# Patient Record
Sex: Male | Born: 1960 | Race: Black or African American | Hispanic: No | Marital: Married | Smoking: Never smoker
Health system: Southern US, Community
[De-identification: ages and names within clinical notes are randomized; demographics above are authoritative.]

## PROBLEM LIST (undated history)

## (undated) DIAGNOSIS — J45909 Unspecified asthma, uncomplicated: Secondary | ICD-10-CM

## (undated) DIAGNOSIS — C801 Malignant (primary) neoplasm, unspecified: Secondary | ICD-10-CM

## (undated) DIAGNOSIS — G4730 Sleep apnea, unspecified: Secondary | ICD-10-CM

## (undated) DIAGNOSIS — E119 Type 2 diabetes mellitus without complications: Secondary | ICD-10-CM

## (undated) DIAGNOSIS — I10 Essential (primary) hypertension: Secondary | ICD-10-CM

## (undated) DIAGNOSIS — E785 Hyperlipidemia, unspecified: Secondary | ICD-10-CM

## (undated) HISTORY — DX: Essential (primary) hypertension: I10

## (undated) HISTORY — PX: LAPAROSCOPIC RETROPUBIC PROSTATECTOMY: SUR794

## (undated) HISTORY — PX: LUNG SURGERY: SHX703

## (undated) HISTORY — DX: Unspecified asthma, uncomplicated: J45.909

## (undated) HISTORY — PX: CHOLECYSTECTOMY: SHX55

---

## 2003-05-21 ENCOUNTER — Other Ambulatory Visit: Payer: Self-pay

## 2003-05-23 ENCOUNTER — Other Ambulatory Visit: Payer: Self-pay

## 2004-06-18 ENCOUNTER — Emergency Department: Payer: Self-pay | Admitting: Emergency Medicine

## 2004-08-02 ENCOUNTER — Ambulatory Visit: Payer: Self-pay | Admitting: Endocrinology

## 2007-10-15 ENCOUNTER — Ambulatory Visit: Payer: Self-pay | Admitting: Endocrinology

## 2009-06-22 ENCOUNTER — Ambulatory Visit: Payer: Self-pay | Admitting: General Surgery

## 2009-12-16 ENCOUNTER — Emergency Department: Payer: Self-pay | Admitting: Emergency Medicine

## 2013-01-06 ENCOUNTER — Emergency Department: Payer: Self-pay | Admitting: Emergency Medicine

## 2013-07-20 ENCOUNTER — Ambulatory Visit: Payer: Self-pay | Admitting: Internal Medicine

## 2013-07-23 LAB — PULMONARY FUNCTION TEST

## 2013-07-30 ENCOUNTER — Other Ambulatory Visit: Payer: Self-pay

## 2013-08-04 ENCOUNTER — Ambulatory Visit (INDEPENDENT_AMBULATORY_CARE_PROVIDER_SITE_OTHER): Payer: 59 | Admitting: Internal Medicine

## 2013-08-04 ENCOUNTER — Ambulatory Visit (INDEPENDENT_AMBULATORY_CARE_PROVIDER_SITE_OTHER)
Admission: RE | Admit: 2013-08-04 | Discharge: 2013-08-04 | Disposition: A | Discharge status: Home / Self Care | Payer: 59 | Source: Ambulatory Visit | Attending: Internal Medicine | Admitting: Internal Medicine

## 2013-08-04 ENCOUNTER — Encounter (INDEPENDENT_AMBULATORY_CARE_PROVIDER_SITE_OTHER): Payer: Self-pay

## 2013-08-04 ENCOUNTER — Encounter: Payer: Self-pay | Admitting: Internal Medicine

## 2013-08-04 VITALS — BP 160/94 | HR 97 | Temp 98.1°F | Ht 70.0 in | Wt 223.0 lb

## 2013-08-04 DIAGNOSIS — J45909 Unspecified asthma, uncomplicated: Secondary | ICD-10-CM

## 2013-08-04 DIAGNOSIS — I10 Essential (primary) hypertension: Secondary | ICD-10-CM

## 2013-08-04 MED ORDER — MOMETASONE FURO-FORMOTEROL FUM 100-5 MCG/ACT IN AERO: INHALATION_SPRAY | RESPIRATORY_TRACT | Status: DC

## 2013-08-04 NOTE — Patient Instructions (Signed)
dulera 100 Take 2 puffs first thing in am and then another 2 puffs about 12 hours later.     Only use your albuterol as a rescue medication to be used if you can't catch your breath by resting or doing a relaxed purse lip breathing pattern.  - The less you use it, the better it will work when you need it. - Ok to use up to 2 puffs  every 4 hours if you must but call for immediate appointment if use goes up over your usual need - Don't leave home without it !!  (think of it like the spare tire for your car)   Please remember to go to the  x-ray department downstairs for your tests - we will call you with the results when they are available.    Please schedule a follow up office visit in 4 weeks, sooner if needed with pfts on return

## 2013-08-04 NOTE — Progress Notes (Signed)
Subjective:    Patient ID: Tony Benson, male    DOB: 17-Aug-1960  MRN: [id number]  HPI  53 yobm  Never smoker with childhood asthma treated otc primatene never could do distance sports at wt 89 then around age 53 started getting recurrent pna for which eval by [facility]/ [place] pt says  no dx last evaluated around 2000 with residual sob with heavy exertion only referred 08/04/2013 to pulmonary clinic by Dr Tony Benson.   08/04/2013 1st [facility] office visit/ Tony Benson  Chief Complaint  Patient presents with  . Pulmonary Consult    Referred per Dr. Rosario Benson. Pt states referred for "inflammation".  He c/o cough and SOB for the past "50 yrs".  He has seen pulmonary at [facility] and states they ruled out sarcoid and BOOP and advised "touch of Emphysema".  He is SOB with "walking 10 flights of stairs" He has prod cough with "chalky" sputum maybe once per wk.   took himself off advair x one year has started back using saba once or twice daily snce then  With exertion not much change on vs off advair occ am cough occ wakes up with it once or twice weekly  Restarted advair one week prior to OV  > no better  No obvious other patterns in day to day or daytime variabilty or assoc  cp or chest tightness, subjective wheeze overt sinus or hb symptoms. No unusual exp hx or h/o childhood pna/ asthma or knowledge of premature birth.  Sleeping ok without nocturnal  or early am exacerbation  of respiratory  c/o's or need for noct saba. Also denies any obvious fluctuation of symptoms with weather or environmental changes or other aggravating or alleviating factors except as outlined above   Current Medications, Allergies, Complete Past Medical History, Past Surgical History, Family History, and Social History were reviewed in [employer] record.           Review of Systems  Constitutional: Negative for fever, chills, activity change, appetite change and unexpected weight change.  HENT:  Positive for sneezing. Negative for congestion, dental problem, postnasal drip, rhinorrhea, sore throat, trouble swallowing and voice change.   Eyes: Negative for visual disturbance.  Respiratory: Positive for cough and shortness of breath. Negative for choking.   Cardiovascular: Negative for chest pain and leg swelling.  Gastrointestinal: Negative for nausea, vomiting and abdominal pain.  Genitourinary: Negative for difficulty urinating.  Musculoskeletal: Negative for arthralgias.  Skin: Negative for rash.  Psychiatric/Behavioral: Negative for behavioral problems and confusion.       Objective:   Physical Exam  Wt Readings from Last 3 Encounters:  08/04/13 223 lb (101.152 kg)      HEENT: nl dentition, turbinates, and orophanx. Nl external ear canals without cough reflex   NECK :  without JVD/Nodes/TM/ nl carotid upstrokes bilaterally   LUNGS: no acc muscle use, clear to A and P bilaterally without cough on insp or exp maneuvers   CV:  RRR  no s3 or murmur or increase in P2, no edema   ABD:  soft and nontender with nl excursion in the supine position. No bruits or organomegaly, bowel sounds nl  MS:  warm without deformities, calf tenderness, cyanosis or clubbing  SKIN: warm and dry without lesions    NEURO:  alert, approp, no deficits    cxr 08/04/13 Linear opacities in the right mid and lower lung of uncertain  chronicity. Right infrahilar vascular clips. Blunting of the right  lateral costophrenic angle.  Surgical clips right upper abdomen.  No effusion. Heart size normal.  Visualized skeletal structures are unremarkable.       Assessment & Plan:

## 2013-08-05 ENCOUNTER — Other Ambulatory Visit (HOSPITAL_COMMUNITY): Payer: Self-pay | Admitting: *Deleted

## 2013-08-05 DIAGNOSIS — R0602 Shortness of breath: Secondary | ICD-10-CM

## 2013-08-06 ENCOUNTER — Other Ambulatory Visit: Payer: 59

## 2013-08-06 DIAGNOSIS — R0989 Other specified symptoms and signs involving the circulatory and respiratory systems: Secondary | ICD-10-CM

## 2013-08-07 DIAGNOSIS — I10 Essential (primary) hypertension: Secondary | ICD-10-CM | POA: Insufficient documentation

## 2013-08-07 NOTE — Assessment & Plan Note (Signed)
-   Spirometry 07/23/13  FEV1  1.45 (42% ) with ratio 42%  and reported sign  BD response not quantified on report - 08/04/2013 p extensive coaching HFA effectiveness =    90% > dulera 100 2bid  Not Adequate control on present rx, reviewed > far exceeding rule of 2's for use of rescue saba >> rec trial of dulera 100 2bid and step up if not controlled on prev visit  Severity of obst defect suggests sign remodeling in this longterm asthmatic.

## 2013-08-07 NOTE — Assessment & Plan Note (Signed)
ACE inhibitors are problematic in  pts with airway complaints because  even experienced pulmonologists can't always distinguish ace effects from copd/asthma.  By themselves they don't actually cause a problem, much like oxygen can't by itself start a fire, but they certainly serve as a powerful catalyst or enhancer for any "fire"  or inflammatory process in the upper airway, be it caused by an ET  tube or more commonly reflux (especially in the obese or pts with known GERD or who are on biphoshonates).    In the era of ARB near equivalency until we have a better handle on the reversibility of the airway problem,  It would be better to use ARB then add acei back when all goals of asthma met - one problem with rx for lower airways is the effect of ICS on the upper airway in pts on acei.  For now will leave on with low threshold to d/c acei if symptoms continue to be disproportionate to findings.

## 2013-08-10 ENCOUNTER — Encounter (HOSPITAL_COMMUNITY): Payer: Self-pay | Admitting: Internal Medicine

## 2013-08-12 ENCOUNTER — Encounter (HOSPITAL_COMMUNITY): Payer: Self-pay | Admitting: Internal Medicine

## 2013-09-14 ENCOUNTER — Other Ambulatory Visit: Payer: 59

## 2013-10-15 ENCOUNTER — Emergency Department: Payer: Self-pay | Admitting: Emergency Medicine

## 2014-04-21 ENCOUNTER — Telehealth: Payer: Self-pay | Admitting: Internal Medicine

## 2014-04-21 NOTE — Telephone Encounter (Signed)
Received a fax from pharmacy requesting PA for [facility]. Form printed from [employer] site, completed and faxed. Form given to [place] to keep for follow-up.   Fax# [phone] Pt ID# [id number]

## 2014-04-22 NOTE — Telephone Encounter (Signed)
Medication approved from 04/21/14 until 04/21/24  Spoke with the pt and notified that this was donre  He verbalized understanding  Nothing further needed

## 2014-12-01 ENCOUNTER — Other Ambulatory Visit: Payer: Self-pay | Admitting: Anesthesiology

## 2014-12-01 DIAGNOSIS — G8921 Chronic pain due to trauma: Secondary | ICD-10-CM

## 2014-12-01 DIAGNOSIS — M25511 Pain in right shoulder: Secondary | ICD-10-CM

## 2014-12-01 DIAGNOSIS — M542 Cervicalgia: Secondary | ICD-10-CM

## 2014-12-06 ENCOUNTER — Ambulatory Visit: Payer: 59

## 2015-11-13 ENCOUNTER — Other Ambulatory Visit: Payer: Self-pay | Admitting: Anesthesiology

## 2015-11-13 DIAGNOSIS — M25511 Pain in right shoulder: Secondary | ICD-10-CM

## 2015-11-24 ENCOUNTER — Ambulatory Visit
Admission: RE | Admit: 2015-11-24 | Discharge: 2015-11-24 | Disposition: A | Discharge status: Home / Self Care | Payer: 59 | Source: Ambulatory Visit | Attending: Anesthesiology | Admitting: Anesthesiology

## 2015-11-24 ENCOUNTER — Ambulatory Visit: Payer: 59

## 2015-11-24 DIAGNOSIS — M1288 Other specific arthropathies, not elsewhere classified, other specified site: Secondary | ICD-10-CM | POA: Insufficient documentation

## 2015-11-24 DIAGNOSIS — M542 Cervicalgia: Secondary | ICD-10-CM | POA: Diagnosis present

## 2015-11-24 DIAGNOSIS — G8921 Chronic pain due to trauma: Secondary | ICD-10-CM | POA: Diagnosis present

## 2015-11-28 ENCOUNTER — Other Ambulatory Visit: Payer: Self-pay | Admitting: Anesthesiology

## 2015-11-28 DIAGNOSIS — M542 Cervicalgia: Secondary | ICD-10-CM

## 2015-11-28 DIAGNOSIS — G8921 Chronic pain due to trauma: Secondary | ICD-10-CM

## 2015-12-08 ENCOUNTER — Ambulatory Visit: Payer: 59

## 2016-03-22 ENCOUNTER — Other Ambulatory Visit: Payer: Self-pay

## 2016-03-22 ENCOUNTER — Telehealth: Payer: Self-pay | Admitting: Gastroenterology

## 2016-03-22 NOTE — Telephone Encounter (Signed)
colonoscopy

## 2016-03-25 NOTE — Telephone Encounter (Signed)
LVM for pt to return my call on 03/22/16 @ 2:20pm.

## 2016-04-12 NOTE — Telephone Encounter (Signed)
VM was left again on 03/28/16 @ 4:34pm. Mailed letter requesting a return call to schedule.

## 2017-09-24 ENCOUNTER — Other Ambulatory Visit: Payer: Self-pay | Admitting: Surgery

## 2017-09-24 ENCOUNTER — Encounter (HOSPITAL_COMMUNITY): Payer: Self-pay | Admitting: *Deleted

## 2017-09-24 ENCOUNTER — Observation Stay (HOSPITAL_COMMUNITY)
Admission: AD | Admit: 2017-09-24 | Discharge: 2017-09-26 | Disposition: A | Discharge status: Home / Self Care | Payer: BLUE CROSS/BLUE SHIELD | Source: Ambulatory Visit | Attending: Surgery | Admitting: Surgery

## 2017-09-24 DIAGNOSIS — L03115 Cellulitis of right lower limb: Secondary | ICD-10-CM | POA: Diagnosis present

## 2017-09-24 DIAGNOSIS — Z91010 Allergy to peanuts: Secondary | ICD-10-CM | POA: Insufficient documentation

## 2017-09-24 DIAGNOSIS — L02415 Cutaneous abscess of right lower limb: Secondary | ICD-10-CM | POA: Diagnosis not present

## 2017-09-24 DIAGNOSIS — Z885 Allergy status to narcotic agent status: Secondary | ICD-10-CM | POA: Insufficient documentation

## 2017-09-24 DIAGNOSIS — Z7984 Long term (current) use of oral hypoglycemic drugs: Secondary | ICD-10-CM | POA: Insufficient documentation

## 2017-09-24 DIAGNOSIS — J45909 Unspecified asthma, uncomplicated: Secondary | ICD-10-CM | POA: Diagnosis not present

## 2017-09-24 DIAGNOSIS — I10 Essential (primary) hypertension: Secondary | ICD-10-CM | POA: Diagnosis present

## 2017-09-24 DIAGNOSIS — E119 Type 2 diabetes mellitus without complications: Secondary | ICD-10-CM | POA: Insufficient documentation

## 2017-09-24 DIAGNOSIS — Z79899 Other long term (current) drug therapy: Secondary | ICD-10-CM | POA: Insufficient documentation

## 2017-09-24 LAB — CBC WITH DIFFERENTIAL/PLATELET
Basophils Absolute: 0 10*3/uL (ref 0.0–0.1)
Basophils Relative: 1 %
Eosinophils Absolute: 0.3 10*3/uL (ref 0.0–0.7)
Eosinophils Relative: 4 %
HCT: 40.6 % (ref 39.0–52.0)
Hemoglobin: 13.8 g/dL (ref 13.0–17.0)
Lymphocytes Relative: 6 %
Lymphs Abs: 0.3 10*3/uL — ABNORMAL LOW (ref 0.7–4.0)
MCH: 31.7 pg (ref 26.0–34.0)
MCHC: 34 g/dL (ref 30.0–36.0)
MCV: 93.1 fL (ref 78.0–100.0)
Monocytes Absolute: 0.8 10*3/uL (ref 0.1–1.0)
Monocytes Relative: 13 %
Neutro Abs: 4.5 10*3/uL (ref 1.7–7.7)
Neutrophils Relative %: 76 %
Platelets: 310 10*3/uL (ref 150–400)
RBC: 4.36 MIL/uL (ref 4.22–5.81)
RDW: 12.6 % (ref 11.5–15.5)
WBC: 5.9 10*3/uL (ref 4.0–10.5)

## 2017-09-24 LAB — COMPREHENSIVE METABOLIC PANEL
ALT: 24 U/L (ref 17–63)
AST: 18 U/L (ref 15–41)
Albumin: 3.8 g/dL (ref 3.5–5.0)
Alkaline Phosphatase: 62 U/L (ref 38–126)
Anion gap: 7 (ref 5–15)
BUN: 13 mg/dL (ref 6–20)
CO2: 28 mmol/L (ref 22–32)
Calcium: 9.7 mg/dL (ref 8.9–10.3)
Chloride: 106 mmol/L (ref 101–111)
Creatinine, Ser: 1.11 mg/dL (ref 0.61–1.24)
GFR calc Af Amer: >60 mL/min (ref >60)
GFR calc non Af Amer: >60 mL/min (ref >60)
Glucose, Bld: 83 mg/dL (ref 65–99)
Potassium: 4.1 mmol/L (ref 3.5–5.1)
Sodium: 141 mmol/L (ref 135–145)
Total Bilirubin: 0.7 mg/dL (ref 0.3–1.2)
Total Protein: 7.4 g/dL (ref 6.5–8.1)

## 2017-09-24 LAB — GLUCOSE, CAPILLARY: Glucose-Capillary: 87 mg/dL (ref 65–99)

## 2017-09-24 MED ORDER — HYDROCORTISONE 1 % EX CREA: 1.0000 "application " | TOPICAL_CREAM | Freq: Three times a day (TID) | CUTANEOUS | Status: DC | PRN

## 2017-09-24 MED ORDER — LIP MEDEX EX OINT
1.0000 "application " | TOPICAL_OINTMENT | Freq: Two times a day (BID) | CUTANEOUS | Status: DC
  Administered 2017-09-24 – 2017-09-26 (×4): 1 via TOPICAL
  Filled 2017-09-24 (×2): qty 7

## 2017-09-24 MED ORDER — IBUPROFEN 200 MG PO TABS: 400.0000 mg | ORAL_TABLET | Freq: Four times a day (QID) | ORAL | Status: DC | PRN

## 2017-09-24 MED ORDER — POTASSIUM CHLORIDE IN NACL 20-0.45 MEQ/L-% IV SOLN
INTRAVENOUS | Status: DC
  Administered 2017-09-24: 50 mL/h via INTRAVENOUS
  Filled 2017-09-24: qty 1000

## 2017-09-24 MED ORDER — ALBUTEROL SULFATE (2.5 MG/3ML) 0.083% IN NEBU
3.0000 mL | INHALATION_SOLUTION | Freq: Four times a day (QID) | RESPIRATORY_TRACT | Status: DC | PRN
Start: 2017-09-24 — End: 2017-09-26

## 2017-09-24 MED ORDER — LISINOPRIL 20 MG PO TABS: 20.0000 mg | ORAL_TABLET | Freq: Every day | ORAL | Status: DC

## 2017-09-24 MED ORDER — DIPHENHYDRAMINE HCL 50 MG/ML IJ SOLN: 12.5000 mg | Freq: Four times a day (QID) | INTRAMUSCULAR | Status: DC | PRN

## 2017-09-24 MED ORDER — PROCHLORPERAZINE EDISYLATE 10 MG/2ML IJ SOLN: 5.0000 mg | INTRAMUSCULAR | Status: DC | PRN

## 2017-09-24 MED ORDER — PIPERACILLIN-TAZOBACTAM 3.375 G IVPB
3.3750 g | Freq: Once | INTRAVENOUS | Status: AC
  Administered 2017-09-24: 3.375 g via INTRAVENOUS
  Filled 2017-09-24: qty 50

## 2017-09-24 MED ORDER — AMLODIPINE BESYLATE 5 MG PO TABS: 5.0000 mg | ORAL_TABLET | Freq: Every day | ORAL | Status: DC

## 2017-09-24 MED ORDER — HEPARIN SODIUM (PORCINE) 5000 UNIT/ML IJ SOLN
5000.0000 [IU] | Freq: Three times a day (TID) | INTRAMUSCULAR | Status: DC
  Administered 2017-09-24 – 2017-09-26 (×5): 5000 [IU] via SUBCUTANEOUS
  Filled 2017-09-24 (×5): qty 1

## 2017-09-24 MED ORDER — BISACODYL 10 MG RE SUPP: 10.0000 mg | Freq: Two times a day (BID) | RECTAL | Status: DC | PRN

## 2017-09-24 MED ORDER — HYDROCORTISONE 2.5 % RE CREA: 1.0000 "application " | TOPICAL_CREAM | Freq: Four times a day (QID) | RECTAL | Status: DC | PRN

## 2017-09-24 MED ORDER — HYDRALAZINE HCL 20 MG/ML IJ SOLN: 10.0000 mg | INTRAMUSCULAR | Status: DC | PRN

## 2017-09-24 MED ORDER — PHENOL 1.4 % MT LIQD: 1.0000 | OROMUCOSAL | Status: DC | PRN

## 2017-09-24 MED ORDER — PIPERACILLIN-TAZOBACTAM 3.375 G IVPB
3.3750 g | Freq: Three times a day (TID) | INTRAVENOUS | Status: DC
  Administered 2017-09-25 – 2017-09-26 (×4): 3.375 g via INTRAVENOUS
  Filled 2017-09-24 (×4): qty 50

## 2017-09-24 MED ORDER — CHLORTHALIDONE 25 MG PO TABS
25.0000 mg | ORAL_TABLET | Freq: Every day | ORAL | Status: DC
  Administered 2017-09-25: 25 mg via ORAL
  Filled 2017-09-24 (×2): qty 1

## 2017-09-24 MED ORDER — VANCOMYCIN HCL 10 G IV SOLR
2000.0000 mg | Freq: Once | INTRAVENOUS | Status: AC
  Administered 2017-09-24: 2000 mg via INTRAVENOUS
  Filled 2017-09-24: qty 2000

## 2017-09-24 MED ORDER — ALUM & MAG HYDROXIDE-SIMETH 200-200-20 MG/5ML PO SUSP: 30.0000 mL | Freq: Four times a day (QID) | ORAL | Status: DC | PRN

## 2017-09-24 MED ORDER — HYDROCODONE-ACETAMINOPHEN 5-325 MG PO TABS
1.0000 | ORAL_TABLET | Freq: Four times a day (QID) | ORAL | Status: DC | PRN
  Administered 2017-09-24: 1 via ORAL
  Administered 2017-09-25 (×2): 2 via ORAL
  Administered 2017-09-25 – 2017-09-26 (×4): 1 via ORAL
  Filled 2017-09-24: qty 2
  Filled 2017-09-24 (×2): qty 1
  Filled 2017-09-24 (×2): qty 2
  Filled 2017-09-24: qty 1

## 2017-09-24 MED ORDER — ACETAMINOPHEN 325 MG PO TABS: 325.0000 mg | ORAL_TABLET | Freq: Four times a day (QID) | ORAL | Status: DC | PRN

## 2017-09-24 MED ORDER — ONDANSETRON HCL 4 MG/2ML IJ SOLN: 4.0000 mg | Freq: Four times a day (QID) | INTRAMUSCULAR | Status: DC | PRN

## 2017-09-24 MED ORDER — ACETAMINOPHEN 650 MG RE SUPP: 650.0000 mg | Freq: Four times a day (QID) | RECTAL | Status: DC | PRN

## 2017-09-24 MED ORDER — HYDROCODONE-ACETAMINOPHEN 5-325 MG PO TABS
1.0000 | ORAL_TABLET | Freq: Four times a day (QID) | ORAL | Status: DC | PRN
  Filled 2017-09-24: qty 2

## 2017-09-24 MED ORDER — MOMETASONE FURO-FORMOTEROL FUM 200-5 MCG/ACT IN AERO
2.0000 | INHALATION_SPRAY | Freq: Two times a day (BID) | RESPIRATORY_TRACT | Status: DC
  Administered 2017-09-25 – 2017-09-26 (×3): 2 via RESPIRATORY_TRACT
  Filled 2017-09-24: qty 8.8

## 2017-09-24 MED ORDER — MAGIC MOUTHWASH
15.0000 mL | Freq: Four times a day (QID) | ORAL | Status: DC | PRN
  Filled 2017-09-24: qty 15

## 2017-09-24 MED ORDER — FAMOTIDINE IN NACL 20-0.9 MG/50ML-% IV SOLN
20.0000 mg | Freq: Two times a day (BID) | INTRAVENOUS | Status: DC
  Administered 2017-09-24: 20 mg via INTRAVENOUS
  Filled 2017-09-24: qty 50

## 2017-09-24 MED ORDER — DEXTROSE 5 % IV SOLN
1000.0000 mg | Freq: Four times a day (QID) | INTRAVENOUS | Status: DC | PRN
  Filled 2017-09-24: qty 10

## 2017-09-24 MED ORDER — METFORMIN HCL 500 MG PO TABS: 500.0000 mg | ORAL_TABLET | Freq: Two times a day (BID) | ORAL | Status: DC

## 2017-09-24 MED ORDER — GUAIFENESIN-DM 100-10 MG/5ML PO SYRP: 10.0000 mL | ORAL_SOLUTION | ORAL | Status: DC | PRN

## 2017-09-24 MED ORDER — DIAZEPAM 5 MG PO TABS: 5.0000 mg | ORAL_TABLET | Freq: Two times a day (BID) | ORAL | Status: DC | PRN

## 2017-09-24 MED ORDER — SODIUM CHLORIDE 0.9 % IV SOLN
8.0000 mg | Freq: Four times a day (QID) | INTRAVENOUS | Status: DC | PRN
  Filled 2017-09-24: qty 4

## 2017-09-24 MED ORDER — PSYLLIUM 95 % PO PACK
1.0000 | PACK | Freq: Every day | ORAL | Status: DC
  Administered 2017-09-25 – 2017-09-26 (×2): 1 via ORAL
  Filled 2017-09-24 (×2): qty 1

## 2017-09-24 MED ORDER — GABAPENTIN 300 MG PO CAPS
300.0000 mg | ORAL_CAPSULE | Freq: Two times a day (BID) | ORAL | Status: DC
  Administered 2017-09-24 – 2017-09-26 (×4): 300 mg via ORAL
  Filled 2017-09-24 (×4): qty 1

## 2017-09-24 MED ORDER — MENTHOL 3 MG MT LOZG
1.0000 | LOZENGE | OROMUCOSAL | Status: DC | PRN
Start: 2017-09-24 — End: 2017-09-26

## 2017-09-24 MED ORDER — VANCOMYCIN HCL 10 G IV SOLR
1750.0000 mg | INTRAVENOUS | Status: DC
  Administered 2017-09-25: 1750 mg via INTRAVENOUS
  Filled 2017-09-24: qty 1750

## 2017-09-24 MED ORDER — MOMETASONE FURO-FORMOTEROL FUM 100-5 MCG/ACT IN AERO: 2.0000 | INHALATION_SPRAY | Freq: Two times a day (BID) | RESPIRATORY_TRACT | Status: DC

## 2017-09-24 MED ORDER — LACTATED RINGERS IV BOLUS: 1000.0000 mL | Freq: Three times a day (TID) | INTRAVENOUS | Status: DC | PRN

## 2017-09-24 NOTE — Progress Notes (Signed)
Pharmacy Antibiotic Note  Tony Benson is a 57 y.o. male admitted on 09/24/2017 with cellulitis.  Pharmacy has been consulted for Vancomycin & Zosyn dosing.  Plan: Vancomycin 2gm x1, then 1750mg  q24 Zosyn 3.375gm q8 - 4 hr infusion    Temp (24hrs), Avg:98.4 F (36.9 C), Min:98.4 F (36.9 C), Max:98.4 F (36.9 C)  No results for input(s): WBC, CREATININE, LATICACIDVEN, VANCOTROUGH, VANCOPEAK, VANCORANDOM, GENTTROUGH, GENTPEAK, GENTRANDOM, TOBRATROUGH, TOBRAPEAK, TOBRARND, AMIKACINPEAK, AMIKACINTROU, AMIKACIN in the last 168 hours.  CrCl cannot be calculated (No order found.).    Allergies  Allergen Reactions  . Morphine And Related Itching   Antimicrobials this admission: 6/12 Zosyn >>  6/12 Vancomycin  >>   Dose adjustments this admission:  Microbiology results: None ordered on admission  Thank you for allowing pharmacy to be a part of this patient's care.  [name] PharmD Pager [phone] 09/24/2017, 8:43 PM

## 2017-09-24 NOTE — Progress Notes (Signed)
Chief Complaint:  Smoldering abscess of right inner thigh  History of Present Illness:  Tony Benson is an 57 y.o. male who was referred by [name] this afternoon from his office with an abscess of his right inner thigh.  He was seen in the [place] office and is admitted for treatment of an advanced probable staph abscess of his right thigh.  This is been smoldering for about a week and getting worse.  He was seen at next surgery which is a walk in place and they got cultures which are not available and then he saw his primary care doctor, [name] this afternoon who called and I was able to evaluate him and incise and drain this abscess.  He has an area of cellulitis extending down to almost his knee.  He is able to drive and in fact we will make it ranges for admission to [facility] long and he can be admitted for IV vancomycin and evaluation in the morning as to whether he may need further surgical debridement.  We'll consider CT of his thigh.  Past Medical History:  Diagnosis Date  . Asthma   . Hypertension       Current Outpatient Medications  Medication Sig Dispense Refill  . amLODipine (NORVASC) 5 MG tablet TAKE 1 TABLET ONCE A DAY ORALLY  3  . chlorthalidone (HYGROTON) 25 MG tablet TAKE 1 TABLET ONCE A DAY ORALLY  3  . diazepam (VALIUM) 5 MG tablet TAKE 1 TABLET BY MOUTH AT BEDTIME AS NEEDED FOR MUSCLE SPASMS  2  . Fluticasone-Salmeterol (ADVAIR DISKUS) 500-50 MCG/DOSE AEPB Inhale into the lungs.    . gabapentin (NEURONTIN) 300 MG capsule TAKE 1 CAPSULE BY MOUTH EVERY NIGHT FOR 2 WEEKS, THEN 1 CAPSULE EVERY 12 HOURS AFTER THAT  1  . HYDROcodone-acetaminophen (NORCO/VICODIN) 5-325 MG per tablet Take 1 tablet by mouth every 6 (six) hours as needed.    Marland Kitchen lisinopril (PRINIVIL,ZESTRIL) 20 MG tablet Take 1 tablet by mouth daily.    . metFORMIN (GLUCOPHAGE) 500 MG tablet Take 500 mg by mouth 2 (two) times daily with a meal.    . mometasone-formoterol (DULERA) 100-5 MCG/ACT AERO  Take 2 puffs first thing in am and then another 2 puffs about 12 hours later. 1 Inhaler 11  . VENTOLIN HFA 108 (90 BASE) MCG/ACT inhaler Inhale 2 puffs into the lungs every 6 (six) hours as needed.    . Vitamin D, Ergocalciferol, (DRISDOL) 50000 units CAPS capsule TAKE 1 CAPSULE ONCE A WEEK ORALLY  0   No current facility-administered medications for this visit.    Morphine and related Family History  Problem Relation Age of Onset  . Allergies Sister    Social History:   reports that he has never smoked. He has never used smokeless tobacco. He reports that he does not drink alcohol or use drugs.   REVIEW OF SYSTEMS : Negative except for DM and stress as he is working 2 jobs and not getting much sleep  Physical Exam:    Gen:  WDWN AAM NAD  Neurological: Alert and oriented to person, place, and time. Motor and sensory function is grossly intact  Head: Normocephalic and atraumatic.  Eyes: Conjunctivae are normal. Pupils are equal, round, and reactive to light. No scleral icterus.  Neck: Normal range of motion. Neck supple. No tracheal deviation or thyromegaly present.  Cardiovascular:  SR without murmurs or gallops.  No carotid bruits Breast:  Not examined Respiratory: Effort normal.  No  respiratory distress. No chest wall tenderness. Breath sounds normal.  No wheezes, rales or rhonchi.  Abdomen:  Mildly obese GU:  Not examined Musculoskeletal: Normal range of motion. Extremities The right inner thigh has an open area that was draining like a chronic carbuncle;   This was opened in the office and more drainage and was redressed.  Lymphadenopathy: No cervical, preauricular, postauricular or axillary adenopathy is present Skin: Skin is warm and dry. No rash noted. No diaphoresis. No erythema. No pallor. Pscyh: Normal mood and affect. Behavior is normal. Judgment and thought content normal.   LABORATORY RESULTS: No results found for this or any previous visit (from the past 48  hour(s)).   RADIOLOGY RESULTS: No results found.  Problem List: Patient Active Problem List   Diagnosis Date Noted  . HBP (high blood pressure) 08/07/2013  . Asthma, chronic 08/04/2013    Assessment & Plan: Carbuncle of right inner thigh in man with DM  IV Vancocin and assessment for OR drainage     Matt B. Hassell Done, MD, [facility] [phone] beeper [phone]  09/24/2017 5:28 PM

## 2017-09-25 DIAGNOSIS — J45909 Unspecified asthma, uncomplicated: Secondary | ICD-10-CM | POA: Diagnosis present

## 2017-09-25 DIAGNOSIS — L02415 Cutaneous abscess of right lower limb: Secondary | ICD-10-CM | POA: Diagnosis not present

## 2017-09-25 DIAGNOSIS — I10 Essential (primary) hypertension: Secondary | ICD-10-CM | POA: Diagnosis present

## 2017-09-25 LAB — CBC
HCT: 39.7 % (ref 39.0–52.0)
Hemoglobin: 13.1 g/dL (ref 13.0–17.0)
MCH: 31.3 pg (ref 26.0–34.0)
MCHC: 33 g/dL (ref 30.0–36.0)
MCV: 94.7 fL (ref 78.0–100.0)
Platelets: 281 10*3/uL (ref 150–400)
RBC: 4.19 MIL/uL — ABNORMAL LOW (ref 4.22–5.81)
RDW: 12.8 % (ref 11.5–15.5)
WBC: 4.3 10*3/uL (ref 4.0–10.5)

## 2017-09-25 LAB — HIV ANTIBODY (ROUTINE TESTING W REFLEX): HIV Screen 4th Generation wRfx: NONREACTIVE

## 2017-09-25 LAB — GLUCOSE, CAPILLARY
Glucose-Capillary: 108 mg/dL — ABNORMAL HIGH (ref 65–99)
Glucose-Capillary: 69 mg/dL (ref 65–99)
Glucose-Capillary: 151 mg/dL — ABNORMAL HIGH (ref 65–99)
Glucose-Capillary: 111 mg/dL — ABNORMAL HIGH (ref 65–99)

## 2017-09-25 LAB — MRSA PCR SCREENING: MRSA by PCR: POSITIVE — AB

## 2017-09-25 LAB — HEMOGLOBIN A1C
Hgb A1c MFr Bld: 6.6 % — ABNORMAL HIGH (ref 4.8–5.6)
Mean Plasma Glucose: 142.72 mg/dL

## 2017-09-25 MED ORDER — DEXTROSE-NACL 5-0.45 % IV SOLN
INTRAVENOUS | Status: DC
  Administered 2017-09-25: 09:00:00 via INTRAVENOUS

## 2017-09-25 MED ORDER — MUPIROCIN 2 % EX OINT
1.0000 "application " | TOPICAL_OINTMENT | Freq: Two times a day (BID) | CUTANEOUS | Status: DC
  Administered 2017-09-25 – 2017-09-26 (×3): 1 via NASAL
  Filled 2017-09-25: qty 22

## 2017-09-25 MED ORDER — INSULIN ASPART 100 UNIT/ML ~~LOC~~ SOLN
0.0000 [IU] | Freq: Three times a day (TID) | SUBCUTANEOUS | Status: DC
  Administered 2017-09-25: 3 [IU] via SUBCUTANEOUS

## 2017-09-25 MED ORDER — SODIUM CHLORIDE 0.9% FLUSH: 3.0000 mL | INTRAVENOUS | Status: DC | PRN

## 2017-09-25 MED ORDER — VITAMIN E 45 MG (100 UNIT) PO CAPS
100.0000 [IU] | ORAL_CAPSULE | Freq: Every day | ORAL | Status: DC
  Administered 2017-09-25 – 2017-09-26 (×2): 100 [IU] via ORAL
  Filled 2017-09-25 (×3): qty 1

## 2017-09-25 MED ORDER — DEXTROSE-NACL 5-0.45 % IV SOLN: INTRAVENOUS | Status: DC

## 2017-09-25 MED ORDER — AMLODIPINE BESYLATE 10 MG PO TABS
10.0000 mg | ORAL_TABLET | Freq: Every day | ORAL | Status: DC
  Administered 2017-09-25 – 2017-09-26 (×2): 10 mg via ORAL
  Filled 2017-09-25 (×2): qty 1

## 2017-09-25 MED ORDER — METOPROLOL TARTRATE 25 MG PO TABS
25.0000 mg | ORAL_TABLET | Freq: Two times a day (BID) | ORAL | Status: DC
  Administered 2017-09-25 – 2017-09-26 (×3): 25 mg via ORAL
  Filled 2017-09-25 (×3): qty 1

## 2017-09-25 MED ORDER — SODIUM CHLORIDE 0.9% FLUSH
3.0000 mL | Freq: Two times a day (BID) | INTRAVENOUS | Status: DC
  Administered 2017-09-25 (×2): 3 mL via INTRAVENOUS

## 2017-09-25 MED ORDER — LACTATED RINGERS IV BOLUS: 1000.0000 mL | Freq: Three times a day (TID) | INTRAVENOUS | Status: DC | PRN

## 2017-09-25 MED ORDER — OMEGA-3-ACID ETHYL ESTERS 1 G PO CAPS
1.0000 g | ORAL_CAPSULE | Freq: Two times a day (BID) | ORAL | Status: DC
  Administered 2017-09-25 – 2017-09-26 (×3): 1 g via ORAL
  Filled 2017-09-25 (×3): qty 1

## 2017-09-25 MED ORDER — CHLORHEXIDINE GLUCONATE CLOTH 2 % EX PADS
6.0000 | MEDICATED_PAD | Freq: Every day | CUTANEOUS | Status: DC
  Administered 2017-09-26: 6 via TOPICAL

## 2017-09-25 MED ORDER — SODIUM CHLORIDE 0.9 % IV SOLN: 250.0000 mL | INTRAVENOUS | Status: DC | PRN

## 2017-09-25 MED ORDER — INSULIN ASPART 100 UNIT/ML ~~LOC~~ SOLN: 0.0000 [IU] | Freq: Every day | SUBCUTANEOUS | Status: DC

## 2017-09-25 MED ORDER — LOSARTAN POTASSIUM 50 MG PO TABS
100.0000 mg | ORAL_TABLET | Freq: Every day | ORAL | Status: DC
  Administered 2017-09-25 – 2017-09-26 (×2): 100 mg via ORAL
  Filled 2017-09-25 (×2): qty 2

## 2017-09-25 MED ORDER — GLIPIZIDE 5 MG PO TABS
5.0000 mg | ORAL_TABLET | Freq: Two times a day (BID) | ORAL | Status: DC
  Administered 2017-09-26: 5 mg via ORAL
  Filled 2017-09-25 (×2): qty 1

## 2017-09-25 NOTE — Progress Notes (Signed)
[facility] Progress Note     Subjective: CC- right thigh abscess Patient admitted to [facility] last night from [place] office where he saw Dr. Hassell Done. States that he has had a right thigh abscess for about 1 week. Never had one before. This was I&D-ed in the office yesterday. Draining purulent fluid. Cellulitis was marked with a pen and appears to be improving.   Objective: Vital signs in last 24 hours: Temp:  [97.8 F (36.6 C)] 97.8 F (36.6 C) (06/13 0552) Pulse Rate:  [71-83] 74 (06/13 0552) Resp:  [13-14] 14 (06/13 0552) BP: (115-130)/(68-87) 122/81 (06/13 0552) SpO2:  [96 %-97 %] 96 % (06/13 0552) Weight:  [220 lb 14.4 oz (100.2 kg)] 220 lb 14.4 oz (100.2 kg) (06/12 2008)    Intake/Output from previous day: 06/12 0701 - 06/13 0700 In: 0  Out: 250 [Urine:250] Intake/Output this shift: No intake/output data recorded.  PE: Gen:  Alert, NAD, pleasant HEENT: EOM's intact, pupils equal and round Card:  RRR, no M/G/R heard Pulm:  CTAB, no W/R/R, effort normal Abd: Soft, NT/ND, +BS, no HSM, no hernia Ext:  Calves soft and nontender. Right proximal/medial thigh abscess s/p I&D with purulent drainage, surrounding induration but no fluctuance. Cellulitis marked with pen improving Psych: A&Ox3  Skin: no other rashes noted, warm and dry  Lab Results:  Recent Labs    09/24/17 2019 09/25/17 0450  WBC 5.9 4.3  HGB 13.8 13.1  HCT 40.6 39.7  PLT 310 281   BMET Recent Labs    09/24/17 2019  NA 141  K 4.1  CL 106  CO2 28  GLUCOSE 83  BUN 13  CREATININE 1.11  CALCIUM 9.7   PT/INR No results for input(s): LABPROT, INR in the last 72 hours. CMP     Component Value Date/Time   NA 141 09/24/2017 2019   K 4.1 09/24/2017 2019   CL 106 09/24/2017 2019   CO2 28 09/24/2017 2019   GLUCOSE 83 09/24/2017 2019   BUN 13 09/24/2017 2019   CREATININE 1.11 09/24/2017 2019   CALCIUM 9.7 09/24/2017 2019   PROT 7.4 09/24/2017 2019   ALBUMIN 3.8 09/24/2017 2019   AST 18  09/24/2017 2019   ALT 24 09/24/2017 2019   ALKPHOS 62 09/24/2017 2019   BILITOT 0.7 09/24/2017 2019   GFRNONAA >60 09/24/2017 2019   GFRAA >60 09/24/2017 2019   Lipase  No results found for: LIPASE     Studies/Results: No results found.  Anti-infectives: Anti-infectives (From admission, onward)   Start     Dose/Rate Route Frequency Ordered Stop   09/25/17 2200  vancomycin (VANCOCIN) 1,750 mg in sodium chloride 0.9 % 500 mL IVPB     1,750 mg 250 mL/hr over 120 Minutes Intravenous Every 24 hours 09/24/17 2127     09/25/17 0500  piperacillin-tazobactam (ZOSYN) IVPB 3.375 g     3.375 g 12.5 mL/hr over 240 Minutes Intravenous Every 8 hours 09/24/17 2041     09/24/17 2200  vancomycin (VANCOCIN) 2,000 mg in sodium chloride 0.9 % 500 mL IVPB     2,000 mg 250 mL/hr over 120 Minutes Intravenous  Once 09/24/17 2042 09/25/17 0051   09/24/17 2100  piperacillin-tazobactam (ZOSYN) IVPB 3.375 g     3.375 g 100 mL/hr over 30 Minutes Intravenous  Once 09/24/17 2041 09/25/17 0055       Assessment/Plan HTN - home meds DM - home meds and SSI  Right thigh abscess - s/p bedside I&D 6/12 by Dr. Hassell Done -  open and draining well, no fluctuance on exam - MRSA swab pending  ID - zosyn/vancomycin 6/12>> FEN - HH/CM diet VTE - heparin, SCDs Foley - none Follow up - TBD  Plan - Send drainage for culture. Continue IV abx. Encouraged patient to shower 2+ times today to keep wound clean.    LOS: 1 day    Wellington Hampshire , [facility] 09/25/2017, 7:27 AM Pager: [phone] Consults: [phone] Mon 7:00 am -11:30 AM Tues-Fri 7:00 am-4:30 pm Sat-Sun 7:00 am-11:30 am

## 2017-09-25 NOTE — H&P (Signed)
Chief Complaint:  Smoldering abscess of right inner thigh  History of Present Illness:  Tony Benson is an 57 y.o. male who was referred by [name] this afternoon from his office with an abscess of his right inner thigh.  He was seen in the [place] office and is admitted for treatment of an advanced probable staph abscess of his right thigh.  This is been smoldering for about a week and getting worse.  He was seen at next surgery which is a walk in place and they got cultures which are not available and then he saw his primary care doctor, [name] this afternoon who called and I was able to evaluate him and incise and drain this abscess.  He has an area of cellulitis extending down to almost his knee.  He is able to drive and in fact we will make it ranges for admission to [facility] long and he can be admitted for IV vancomycin and evaluation in the morning as to whether he may need further surgical debridement.  We'll consider CT of his thigh.  Past Medical History:  Diagnosis Date  . Asthma   . Hypertension       Current Facility-Administered Medications  Medication Dose Route Frequency Provider Last Rate Last Dose  . 0.9 %  sodium chloride infusion  250 mL Intravenous PRN [name], MD      . acetaminophen (TYLENOL) tablet 325-650 mg  325-650 mg Oral Q6H PRN [name], RPH       Or  . acetaminophen (TYLENOL) suppository 650 mg  650 mg Rectal Q6H PRN [name], Terri L, RPH      . albuterol (PROVENTIL) (2.5 MG/3ML) 0.083% nebulizer solution 3 mL  3 mL Inhalation Q6H PRN [name], MD      . alum & mag hydroxide-simeth (MAALOX/MYLANTA) 200-200-20 MG/5ML suspension 30 mL  30 mL Oral Q6H PRN [name], MD      . amLODipine (NORVASC) tablet 10 mg  10 mg Oral Daily [name], MD   10 mg at 09/25/17 1018  . bisacodyl (DULCOLAX) suppository 10 mg  10 mg Rectal Q12H PRN [name], MD      . chlorthalidone (HYGROTON) tablet 25 mg  25 mg Oral Daily [name],  MD   25 mg at 09/25/17 1018  . diazepam (VALIUM) tablet 5 mg  5 mg Oral Q12H PRN [name], MD      . diphenhydrAMINE (BENADRYL) injection 12.5-25 mg  12.5-25 mg Intravenous Q6H PRN [name], MD      . gabapentin (NEURONTIN) capsule 300 mg  300 mg Oral BID [name], MD   300 mg at 09/25/17 1018  . glipiZIDE (GLUCOTROL) tablet 5 mg  5 mg Oral BID AC Meuth, Brooke A, PA-C      . guaiFENesin-dextromethorphan (ROBITUSSIN DM) 100-10 MG/5ML syrup 10 mL  10 mL Oral Q4H PRN [name], MD      . heparin injection 5,000 Units  5,000 Units Subcutaneous Q8H [name], MD   5,000 Units at 09/25/17 [phone]  . hydrALAZINE (APRESOLINE) injection 10 mg  10 mg Intravenous Q2H PRN [name], MD      . HYDROcodone-acetaminophen (NORCO/VICODIN) 5-325 MG per tablet 1-2 tablet  1-2 tablet Oral Q6H PRN [name], RPH   1 tablet at 09/25/17 0413  . hydrocortisone (ANUSOL-HC) 2.5 % rectal cream 1 application  1 application Topical QID PRN [name], MD      . hydrocortisone  cream 1 % 1 application  1 application Topical TID PRN [name], MD      . ibuprofen (ADVIL,MOTRIN) tablet 400-800 mg  400-800 mg Oral Q6H PRN [name], RPH      . insulin aspart (novoLOG) injection 0-15 Units  0-15 Units Subcutaneous TID WC [name], MD      . insulin aspart (novoLOG) injection 0-5 Units  0-5 Units Subcutaneous QHS [name], MD      . lactated ringers bolus 1,000 mL  1,000 mL Intravenous Q8H PRN Gross, [name], MD      . lactated ringers bolus 1,000 mL  1,000 mL Intravenous Q8H PRN Gross, [name], MD      . lip balm (CARMEX) ointment 1 application  1 application Topical BID [name], MD   1 application at 78/67/54 1019  . losartan (COZAAR) tablet 100 mg  100 mg Oral Daily [name], MD   100 mg at 09/25/17 1018  . magic mouthwash  15 mL Oral QID PRN [name], MD      . menthol-cetylpyridinium (CEPACOL) lozenge 3 mg  1 lozenge Oral PRN [name], MD      .  methocarbamol (ROBAXIN) 1,000 mg in dextrose 5 % 50 mL IVPB  1,000 mg Intravenous Q6H PRN [name], MD      . metoprolol tartrate (LOPRESSOR) tablet 25 mg  25 mg Oral BID [name], MD   25 mg at 09/25/17 1018  . mometasone-formoterol (DULERA) 200-5 MCG/ACT inhaler 2 puff  2 puff Inhalation BID [name], MD   2 puff at 09/25/17 0849  . omega-3 acid ethyl esters (LOVAZA) capsule 1 g  1 g Oral BID [name], MD   1 g at 09/25/17 1018  . ondansetron (ZOFRAN) injection 4 mg  4 mg Intravenous Q6H PRN [name], MD       Or  . ondansetron (ZOFRAN) 8 mg in sodium chloride 0.9 % 50 mL IVPB  8 mg Intravenous Q6H PRN [name], MD      . phenol (CHLORASEPTIC) mouth spray 1-2 spray  1-2 spray Mouth/Throat PRN [name], MD      . piperacillin-tazobactam (ZOSYN) IVPB 3.375 g  3.375 g Intravenous Q8H [name], RPH   Stopped at 09/25/17 4920  . prochlorperazine (COMPAZINE) injection 5-10 mg  5-10 mg Intravenous Q4H PRN [name], MD      . psyllium (HYDROCIL/METAMUCIL) packet 1 packet  1 packet Oral Daily [name], MD   1 packet at 09/25/17 1018  . sodium chloride flush (NS) 0.9 % injection 3 mL  3 mL Intravenous [name], MD   3 mL at 09/25/17 1019  . sodium chloride flush (NS) 0.9 % injection 3 mL  3 mL Intravenous PRN [name], MD      . vancomycin (VANCOCIN) 1,750 mg in sodium chloride 0.9 % 500 mL IVPB  1,750 mg Intravenous Q24H Green, Terri L, RPH      . vitamin E capsule 100 Units  100 Units Oral Daily [name], MD       Other and Morphine and related Family History  Problem Relation Age of Onset  . Allergies Sister    Social History:   reports that he has never smoked. He has never used smokeless tobacco. He reports that he does not drink alcohol or use drugs.   REVIEW OF SYSTEMS : Negative except for DM and stress as he is working 2 jobs and not getting much  sleep  Physical Exam:    Gen:  WDWN AAM NAD  Neurological: Alert and  oriented to person, place, and time. Motor and sensory function is grossly intact  Head: Normocephalic and atraumatic.  Eyes: Conjunctivae are normal. Pupils are equal, round, and reactive to light. No scleral icterus.  Neck: Normal range of motion. Neck supple. No tracheal deviation or thyromegaly present.  Cardiovascular:  SR without murmurs or gallops.  No carotid bruits Breast:  Not examined Respiratory: Effort normal.  No respiratory distress. No chest wall tenderness. Breath sounds normal.  No wheezes, rales or rhonchi.  Abdomen:  Mildly obese GU:  Not examined Musculoskeletal: Normal range of motion. Extremities The right inner thigh has an open area that was draining like a chronic carbuncle;   This was opened in the office and more drainage and was redressed.  Lymphadenopathy: No cervical, preauricular, postauricular or axillary adenopathy is present Skin: Skin is warm and dry. No rash noted. No diaphoresis. No erythema. No pallor. Pscyh: Normal mood and affect. Behavior is normal. Judgment and thought content normal.   LABORATORY RESULTS: Results for orders placed or performed during the hospital encounter of 09/24/17 (from the past 48 hour(s))  CBC WITH DIFFERENTIAL     Status: Abnormal   Collection Time: 09/24/17  8:19 PM  Result Value Ref Range   WBC 5.9 4.0 - 10.5 K/uL   RBC 4.36 4.22 - 5.81 MIL/uL   Hemoglobin 13.8 13.0 - 17.0 g/dL   HCT 40.6 39.0 - 52.0 %   MCV 93.1 78.0 - 100.0 fL   MCH 31.7 26.0 - 34.0 pg   MCHC 34.0 30.0 - 36.0 g/dL   RDW 12.6 11.5 - 15.5 %   Platelets 310 150 - 400 K/uL   Neutrophils Relative % 76 %   Neutro Abs 4.5 1.7 - 7.7 K/uL   Lymphocytes Relative 6 %   Lymphs Abs 0.3 (L) 0.7 - 4.0 K/uL   Monocytes Relative 13 %   Monocytes Absolute 0.8 0.1 - 1.0 K/uL   Eosinophils Relative 4 %   Eosinophils Absolute 0.3 0.0 - 0.7 K/uL   Basophils Relative 1 %   Basophils Absolute 0.0 0.0 - 0.1 K/uL    Comment: Performed at [facility],  [place] [address] [place], [place] [address]  Comprehensive metabolic panel     Status: None   Collection Time: 09/24/17  8:19 PM  Result Value Ref Range   Sodium 141 135 - 145 mmol/L   Potassium 4.1 3.5 - 5.1 mmol/L   Chloride 106 101 - 111 mmol/L   CO2 28 22 - 32 mmol/L   Glucose, Bld 83 65 - 99 mg/dL   BUN 13 6 - 20 mg/dL   Creatinine, Ser 1.11 0.61 - 1.24 mg/dL   Calcium 9.7 8.9 - 10.3 mg/dL   Total Protein 7.4 6.5 - 8.1 g/dL   Albumin 3.8 3.5 - 5.0 g/dL   AST 18 15 - 41 U/L   ALT 24 17 - 63 U/L   Alkaline Phosphatase 62 38 - 126 U/L   Total Bilirubin 0.7 0.3 - 1.2 mg/dL   GFR calc non Af Amer >60 >60 mL/min   GFR calc Af Amer >60 >60 mL/min    Comment: (NOTE) The eGFR has been calculated using the CKD EPI equation. This calculation has not been validated in all clinical situations. eGFR's persistently <60 mL/min signify possible Chronic Kidney Disease.    Anion gap 7 5 - 15  Comment: Performed at [facility], [place] [address], [place], [place] [address]  HIV antibody     Status: None   Collection Time: 09/24/17  8:19 PM  Result Value Ref Range   HIV Screen 4th Generation wRfx Non Reactive Non Reactive    Comment: (NOTE) Performed At: [facility] [place], [place] [id number] Rush Farmer MD [id number] Performed at [facility], [place] [address] [place], [place] [address]   Glucose, capillary     Status: None   Collection Time: 09/24/17 10:09 PM  Result Value Ref Range   Glucose-Capillary 87 65 - 99 mg/dL  CBC     Status: Abnormal   Collection Time: 09/25/17  4:50 AM  Result Value Ref Range   WBC 4.3 4.0 - 10.5 K/uL   RBC 4.19 (L) 4.22 - 5.81 MIL/uL   Hemoglobin 13.1 13.0 - 17.0 g/dL   HCT 39.7 39.0 - 52.0 %   MCV 94.7 78.0 - 100.0 fL   MCH 31.3 26.0 - 34.0 pg   MCHC 33.0 30.0 - 36.0 g/dL   RDW 12.8 11.5 - 15.5 %   Platelets 281 150 - 400 K/uL    Comment: Performed at [facility], [place] [address] [place], [place] [address]  Hemoglobin A1c     Status: Abnormal   Collection Time: 09/25/17  4:50 AM  Result Value Ref Range   Hgb A1c MFr Bld 6.6 (H) 4.8 - 5.6 %    Comment: (NOTE) Pre diabetes:          5.7%-6.4% Diabetes:              >6.4% Glycemic control for   <7.0% adults with diabetes    Mean Plasma Glucose 142.72 mg/dL    Comment: Performed at [place] [address], [place], [place] [address]  Glucose, capillary     Status: None   Collection Time: 09/25/17  7:43 AM  Result Value Ref Range   Glucose-Capillary 69 65 - 99 mg/dL  Glucose, capillary     Status: Abnormal   Collection Time: 09/25/17  8:12 AM  Result Value Ref Range   Glucose-Capillary 108 (H) 65 - 99 mg/dL  Glucose, capillary     Status: Abnormal   Collection Time: 09/25/17 11:20 AM  Result Value Ref Range   Glucose-Capillary 151 (H) 65 - 99 mg/dL     RADIOLOGY RESULTS: No results found.  Problem List: Patient Active Problem List   Diagnosis Date Noted  . Hypertension   . Cellulitis and abscess of right thigh s/p I&D 09/24/2017 09/24/2017  . Asthma, chronic 08/04/2013    Assessment & Plan: Carbuncle of right inner thigh in man with DM  IV Vancocin and assessment for OR drainage     Matt B. Hassell Done, MD, [facility] [phone] beeper [phone]  09/25/2017 12:02 PM

## 2017-09-25 NOTE — Progress Notes (Signed)
Hypoglycemic Event  CBG: 69  Treatment: Orange Juice.  Symptoms: None  Follow-up CBG: Time: 0812 CBG Result:108  Possible Reasons for Event: unknown  Comments/MD notified:Brooke Meuth, PA-C    Janifer Gieselman, [employer]

## 2017-09-26 DIAGNOSIS — L02415 Cutaneous abscess of right lower limb: Secondary | ICD-10-CM | POA: Diagnosis not present

## 2017-09-26 LAB — CBC
HCT: 39.9 % (ref 39.0–52.0)
Hemoglobin: 13.2 g/dL (ref 13.0–17.0)
MCH: 31.1 pg (ref 26.0–34.0)
MCHC: 33.1 g/dL (ref 30.0–36.0)
MCV: 94.1 fL (ref 78.0–100.0)
Platelets: 308 10*3/uL (ref 150–400)
RBC: 4.24 MIL/uL (ref 4.22–5.81)
RDW: 12.6 % (ref 11.5–15.5)
WBC: 4.2 10*3/uL (ref 4.0–10.5)

## 2017-09-26 LAB — GLUCOSE, CAPILLARY
Glucose-Capillary: 139 mg/dL — ABNORMAL HIGH (ref 65–99)
Glucose-Capillary: 91 mg/dL (ref 65–99)

## 2017-09-26 MED ORDER — DOXYCYCLINE HYCLATE 50 MG PO CAPS: 50.0000 mg | ORAL_CAPSULE | Freq: Two times a day (BID) | ORAL | 0 refills | Status: DC

## 2017-09-26 MED ORDER — HYDROCODONE-ACETAMINOPHEN 5-325 MG PO TABS: 1.0000 | ORAL_TABLET | Freq: Four times a day (QID) | ORAL | 0 refills | Status: DC | PRN

## 2017-09-26 MED ORDER — MUPIROCIN 2 % EX OINT: 1.0000 "application " | TOPICAL_OINTMENT | Freq: Two times a day (BID) | CUTANEOUS | 0 refills | Status: DC

## 2017-09-26 NOTE — Progress Notes (Signed)
Discharge instructions reviewed with patient. All questions answered. Patient wheeled to vehicle with belongings by nurse tech. 

## 2017-09-26 NOTE — Discharge Summary (Signed)
[facility] Discharge Summary   Patient ID: Tony Benson MRN: [id number] DOB/AGE: 1960-04-26 57 y.o.  Admit date: 09/24/2017 Discharge date: 09/26/2017  Admitting Diagnosis: Right thigh abscess  Discharge Diagnosis Patient Active Problem List   Diagnosis Date Noted  . Hypertension   . Cellulitis and abscess of right thigh s/p I&D 09/24/2017 09/24/2017  . Asthma, chronic 08/04/2013    Consultants None  Imaging: No results found.  Procedures None  Hospital Course:  Tony Benson is a 57yo male who was admitted to [facility] 6/12 with right thigh abscess. Patient was seen in our outpatient general surgery clinic that day where he underwent bedside I&D of the abscess. He was admitted for IV antibiotics and monitoring. On 6/14 the patient's cellulitis had resolved, wound draining appropriately, pain well controlled, vital signs stable and felt stable for discharge home.  Culture growing gram positive cocci so he will be sent home on doxycycline. Patient will follow up as below and knows to call with questions or concerns.     Physical Exam: Gen:  Alert, NAD, pleasant HEENT: EOM's intact, pupils equal and round Card:  RRR, no M/G/R heard Pulm:  CTAB, no W/R/R, effort normal Abd: Soft, NT/ND, +BS, no HSM, no hernia Ext:  Calves soft and nontender. Right proximal/medial thigh abscess s/p I&D with purulent drainage, surrounding induration improved from yesterday and there is no fluctuance. Cellulitis resolved Psych: A&Ox3  Skin: no other rashes noted, warm and dry   Allergies as of 09/26/2017      Reactions   Other Anaphylaxis   peanuts   Morphine And Related Itching      Medication List    TAKE these medications   amLODipine 10 MG tablet Commonly known as:  NORVASC Take 10 mg by mouth daily.   doxycycline 50 MG capsule Commonly known as:  VIBRAMYCIN Take 1 capsule (50 mg total) by mouth 2 (two) times daily.   FISH OIL PO Take 1 capsule by mouth daily.    gabapentin 300 MG capsule Commonly known as:  NEURONTIN 1 CAPSULE EVERY 12 HOURS   glipiZIDE 5 MG tablet Commonly known as:  GLUCOTROL Take 5 mg by mouth 2 (two) times daily before a meal.   HYDROcodone-acetaminophen 5-325 MG tablet Commonly known as:  NORCO/VICODIN Take 1 tablet by mouth every 6 (six) hours as needed for severe pain. What changed:  reasons to take this   losartan 100 MG tablet Commonly known as:  COZAAR Take 100 mg by mouth daily.   metFORMIN 500 MG tablet Commonly known as:  GLUCOPHAGE Take 1,000 mg by mouth 2 (two) times daily with a meal.   metoprolol tartrate 25 MG tablet Commonly known as:  LOPRESSOR Take 25 mg by mouth 2 (two) times daily.   mometasone-formoterol 100-5 MCG/ACT Aero Commonly known as:  DULERA Take 2 puffs first thing in am and then another 2 puffs about 12 hours later. What changed:    how much to take  how to take this  when to take this  reasons to take this  additional instructions   mupirocin ointment 2 % Commonly known as:  BACTROBAN Place 1 application into the nose 2 (two) times daily.   Vitamin D (Ergocalciferol) 50000 units Caps capsule Commonly known as:  DRISDOL Take 50,000 Units by mouth every 7 (seven) days.   vitamin E 100 UNIT capsule Take 100 Units by mouth daily.        Follow-up [facility]. Call.   Specialty:  General Surgery Why:  We are working on your appointment, please call to confirm. Please arrive 15 minutes early to check in and fill out paperwork. Contact information: [address] [place] [phone]          Signed: Wellington Hampshire, [facility] 09/26/2017, 8:55 AM Pager: [phone] Consults: [phone] Mon 7:00 am -11:30 AM Tues-Fri 7:00 am-4:30 pm Sat-Sun 7:00 am-11:30 am

## 2017-09-26 NOTE — Discharge Instructions (Signed)
Take antibiotics as prescribed Wound will drain for several more days, change dry dressing daily and as needed Continue to shower with wound open Alternate ice and heat Call with concerns (ie increased redness, fevers, chills...) Follow up as scheduled

## 2017-09-30 LAB — AEROBIC/ANAEROBIC CULTURE W GRAM STAIN (SURGICAL/DEEP WOUND)

## 2017-09-30 LAB — AEROBIC/ANAEROBIC CULTURE (SURGICAL/DEEP WOUND)

## 2018-01-07 ENCOUNTER — Other Ambulatory Visit: Payer: Self-pay

## 2018-01-07 ENCOUNTER — Ambulatory Visit
Admission: RE | Admit: 2018-01-07 | Discharge: 2018-01-07 | Disposition: A | Discharge status: Home / Self Care | Payer: BLUE CROSS/BLUE SHIELD | Source: Ambulatory Visit | Attending: Student in an Organized Health Care Education/Training Program | Admitting: Student in an Organized Health Care Education/Training Program

## 2018-01-07 ENCOUNTER — Encounter: Payer: Self-pay | Admitting: Student in an Organized Health Care Education/Training Program

## 2018-01-07 ENCOUNTER — Ambulatory Visit (HOSPITAL_BASED_OUTPATIENT_CLINIC_OR_DEPARTMENT_OTHER): Payer: BLUE CROSS/BLUE SHIELD | Admitting: Student in an Organized Health Care Education/Training Program

## 2018-01-07 VITALS — BP 156/79 | HR 94 | Temp 97.4°F | Resp 17 | Ht 70.0 in | Wt 220.0 lb

## 2018-01-07 DIAGNOSIS — M542 Cervicalgia: Secondary | ICD-10-CM | POA: Insufficient documentation

## 2018-01-07 DIAGNOSIS — M47812 Spondylosis without myelopathy or radiculopathy, cervical region: Secondary | ICD-10-CM | POA: Diagnosis not present

## 2018-01-07 DIAGNOSIS — Z9049 Acquired absence of other specified parts of digestive tract: Secondary | ICD-10-CM | POA: Diagnosis not present

## 2018-01-07 DIAGNOSIS — G894 Chronic pain syndrome: Secondary | ICD-10-CM

## 2018-01-07 MED ORDER — ROPIVACAINE HCL 2 MG/ML IJ SOLN
10.0000 mL | Freq: Once | INTRAMUSCULAR | Status: AC
  Administered 2018-01-07: 10 mL

## 2018-01-07 MED ORDER — FENTANYL CITRATE (PF) 100 MCG/2ML IJ SOLN
INTRAMUSCULAR | Status: AC
  Filled 2018-01-07: qty 2

## 2018-01-07 MED ORDER — LIDOCAINE HCL 2 % IJ SOLN
20.0000 mL | Freq: Once | INTRAMUSCULAR | Status: AC
  Administered 2018-01-07: 400 mg

## 2018-01-07 MED ORDER — LIDOCAINE HCL 2 % IJ SOLN
INTRAMUSCULAR | Status: AC
  Filled 2018-01-07: qty 20

## 2018-01-07 MED ORDER — FENTANYL CITRATE (PF) 100 MCG/2ML IJ SOLN
25.0000 ug | INTRAMUSCULAR | Status: DC | PRN
  Administered 2018-01-07: 50 ug via INTRAVENOUS

## 2018-01-07 MED ORDER — LACTATED RINGERS IV SOLN
1000.0000 mL | Freq: Once | INTRAVENOUS | Status: AC
  Administered 2018-01-07: 1000 mL via INTRAVENOUS

## 2018-01-07 MED ORDER — DEXAMETHASONE SODIUM PHOSPHATE 10 MG/ML IJ SOLN
10.0000 mg | Freq: Once | INTRAMUSCULAR | Status: AC
  Administered 2018-01-07: 10 mg

## 2018-01-07 MED ORDER — DEXAMETHASONE SODIUM PHOSPHATE 10 MG/ML IJ SOLN
INTRAMUSCULAR | Status: AC
  Filled 2018-01-07: qty 1

## 2018-01-07 MED ORDER — ROPIVACAINE HCL 2 MG/ML IJ SOLN
INTRAMUSCULAR | Status: AC
  Filled 2018-01-07: qty 10

## 2018-01-07 NOTE — Patient Instructions (Signed)

## 2018-01-07 NOTE — Progress Notes (Signed)
Safety precautions to be maintained throughout the outpatient stay will include: orient to surroundings, keep bed in low position, maintain call bell within reach at all times, provide assistance with transfer out of bed and ambulation.  

## 2018-01-07 NOTE — Progress Notes (Signed)
Patient's Name: Tony Benson  MRN: [id number]  Referring Provider: Nathanial Rancher, MD  DOB: 1961-03-13  PCP: Casilda Carls, MD (Inactive)  DOS: 01/07/2018  Note by: Gillis Santa, MD  Service setting: Ambulatory outpatient  Specialty: Interventional Pain Management  Location: [facility] (AMB) Pain Management Facility    Patient type: New patient ("FAST-TRACK" Evaluation)   Warning: This referral option does not include the extensive pharmacological evaluation required for [place] to take over the patient's medication management. The "Fast-Track" system is designed to bypass the new patient referral waiting list, as well as the normal patient evaluation process, in order to provide a patient in distress with a timely pain management intervention. Because the system was not designed to unfairly get a patient into our pain practice ahead of those already waiting, certain restrictions apply. By requesting a "Fast-Track" consult, the referring physician has opted to continue managing the patient's medications in order to get interventional urgent care.  Primary Reason for Visit: Interventional Pain Management Treatment. CC: Neck Pain (right sided)   Procedure  HPI  [name] is a 57 y.o. year old, male patient, who comes today for a  "Fast-Track" new patient evaluation, as requested by Nathanial Rancher, MD. The patient has been made aware that this type of referral option is reserved for the Interventional Pain Management portion of our practice and completely excludes the option of medication management. His primarily concern today is the Neck Pain (right sided)  Pain Assessment: Location:    neck, shoulder, trapezius Radiating:  yes to shoulder and traps Onset: More than a month ago Duration:  worse at night Quality: Sharp, Nagging, Constant(Deep annoying) Severity: 8 /10 (subjective, self-reported pain score)  Note: Reported level is compatible with observation.                         When using our  objective Pain Scale, levels between 6 and 10/10 are said to belong in an emergency room, as it progressively worsens from a 6/10, described as severely limiting, requiring emergency care not usually available at an outpatient pain management facility. At a 6/10 level, communication becomes difficult and requires great effort. Assistance to reach the emergency department may be required. Facial flushing and profuse sweating along with potentially dangerous increases in heart rate and blood pressure will be evident. Effect on ADL:  limits ability to perform hobbies and work Timing: Constant Modifying factors: pain medications, message, ice, hot showers BP: (!) 156/79  HR: 94  Onset and Duration: Sudden and Date of onset: 3 years ago Cause of pain: Unknown Severity: Getting better, NAS-11 at its worse: 10/10, NAS-11 at its best: 4/10, NAS-11 now: 8/10 and NAS-11 on the average: 8/10 Timing: Night and After a period of immobility Aggravating Factors: Motion Alleviating Factors: Acupuncture, Cold packs, Medications, Relaxation therapy, Warm showers or baths and Chiropractic manipulations Associated Problems: Fatigue, Inability to concentrate, Spasms, Pain that wakes patient up and Pain that does not allow patient to sleep Quality of Pain: Aching, Annoying, Getting longer, Nagging, Stabbing, Throbbing, Toothache-like and Uncomfortable Previous Examinations or Tests: CT scan and MRI scan Previous Treatments: Chiropractic manipulations  The patient comes into the clinics today, referred to [place] for a cervical facet medial branch nerve block and possible radio frequency ablation.  In brief patient is 57 year old male with a chief complaint of neck pain (R>L) and shoulder pain as well as headaches that start from his occipital region and radiates superiorly.  This is been  going on for more than 2 years.  He states that the pain is getting worse.  He states that his wife rubs his neck and his shoulders which  somewhat helps.  Patient is on gabapentin as below.  He was on hydrocodone in the past.  Medication management with Dr. Chancy Milroy.  Meds   Current Outpatient Medications:  .  amLODipine (NORVASC) 10 MG tablet, Take 10 mg by mouth daily., Disp: , Rfl: 1 .  gabapentin (NEURONTIN) 300 MG capsule, 1 CAPSULE EVERY 12 HOURS, Disp: , Rfl: 1 .  glipiZIDE (GLUCOTROL) 5 MG tablet, Take 5 mg by mouth 2 (two) times daily before a meal., Disp: , Rfl:  .  losartan (COZAAR) 100 MG tablet, Take 100 mg by mouth daily., Disp: , Rfl: 1 .  metFORMIN (GLUCOPHAGE) 500 MG tablet, Take 1,000 mg by mouth 2 (two) times daily with a meal. , Disp: , Rfl:  .  metoprolol tartrate (LOPRESSOR) 25 MG tablet, Take 25 mg by mouth 2 (two) times daily., Disp: , Rfl:  .  Omega-3 Fatty Acids (FISH OIL PO), Take 1 capsule by mouth daily., Disp: , Rfl:  .  Vitamin D, Ergocalciferol, (DRISDOL) 50000 units CAPS capsule, Take 50,000 Units by mouth every 7 (seven) days., Disp: , Rfl:  .  vitamin E 100 UNIT capsule, Take 100 Units by mouth daily., Disp: , Rfl:   Current Facility-Administered Medications:  .  fentaNYL (SUBLIMAZE) injection 25-100 mcg, 25-100 mcg, Intravenous, Q5 min PRN, Gillis Santa, MD, 50 mcg at 01/07/18 1209  Imaging Review  Cervical Imaging: Cervical MR wo contrast:  Results for orders placed during the hospital encounter of 11/24/15  MR Cervical Spine Wo Contrast   Narrative CLINICAL DATA:  Cervicalgia. Neck pain is chronic worse on the right side. History of trauma.  EXAM: MRI CERVICAL SPINE WITHOUT CONTRAST  TECHNIQUE: Multiplanar, multisequence MR imaging of the cervical spine was performed. No intravenous contrast was administered.  COMPARISON:  10/15/2007  FINDINGS: Motion degraded exam, decreasing sensitivity  Alignment: Physiologic.  Vertebrae: No fracture, evidence of discitis, or bone lesion. Stable mild marrow heterogeneity.  Cord: Normal signal. Borderline thinning at the upper  thoracic levels but stable.  Posterior Fossa, vertebral arteries, paraspinal tissues: Negative.  Disc levels:  C2-3: Facet arthropathy with spurring. No disc herniation. No impingement (left foraminal patency better seen on gradient imaging).  C3-4: Facet arthropathy with spurring. No herniation. No impingement  C4-5: Facet arthropathy with mild spurring. No herniation or impingement  C5-6: Unremarkable.  C6-7: Unremarkable.  C7-T1:Mild facet spurring.  No herniation or impingement  IMPRESSION: Upper cervical facet arthropathy with progressed spurring since 2009. No herniation or impingement.   Electronically Signed   By: Monte Fantasia M.D.   On: 11/24/2015 09:46      Complexity Note: Imaging results reviewed. Results shared with Mr. Hillhouse, using Layman's terms.                         ROS  Cardiovascular: High blood pressure Pulmonary or Respiratory: Lung problems and Wheezing and difficulty taking a deep full breath (Asthma) Neurological: No reported neurological signs or symptoms such as seizures, abnormal skin sensations, urinary and/or fecal incontinence, being born with an abnormal open spine and/or a tethered spinal cord Review of Past Neurological Studies: No results found for this or any previous visit. Psychological-Psychiatric: No reported psychological or psychiatric signs or symptoms such as difficulty sleeping, anxiety, depression, delusions or hallucinations (schizophrenial), mood swings (bipolar  disorders) or suicidal ideations or attempts Gastrointestinal: No reported gastrointestinal signs or symptoms such as vomiting or evacuating blood, reflux, heartburn, alternating episodes of diarrhea and constipation, inflamed or scarred liver, or pancreas or irrregular and/or infrequent bowel movements Genitourinary: No reported renal or genitourinary signs or symptoms such as difficulty voiding or producing urine, peeing blood, non-functioning kidney, kidney  stones, difficulty emptying the bladder, difficulty controlling the flow of urine, or chronic kidney disease Hematological: No reported hematological signs or symptoms such as prolonged bleeding, low or poor functioning platelets, bruising or bleeding easily, hereditary bleeding problems, low energy levels due to low hemoglobin or being anemic Endocrine: High blood sugar requiring insulin (IDDM) Rheumatologic: No reported rheumatological signs and symptoms such as fatigue, joint pain, tenderness, swelling, redness, heat, stiffness, decreased range of motion, with or without associated rash Musculoskeletal: Negative for myasthenia gravis, muscular dystrophy, multiple sclerosis or malignant hyperthermia Work History: Working full time  Allergies  Mr. Eshelman is allergic to other and morphine and related.  Laboratory Chemistry  Inflammation Markers (CRP: Acute Phase) (ESR: Chronic Phase) No results found for: CRP, ESRSEDRATE, LATICACIDVEN                       Rheumatology Markers No results found for: RF, ANA, LABURIC, URICUR, LYMEIGGIGMAB, LYMEABIGMQN, HLAB27                      Renal Function Markers Lab Results  Component Value Date   BUN 13 09/24/2017   CREATININE 1.11 09/24/2017   GFRAA >60 09/24/2017   GFRNONAA >60 09/24/2017                             Hepatic Function Markers Lab Results  Component Value Date   AST 18 09/24/2017   ALT 24 09/24/2017   ALBUMIN 3.8 09/24/2017   ALKPHOS 62 09/24/2017                        Electrolytes Lab Results  Component Value Date   NA 141 09/24/2017   K 4.1 09/24/2017   CL 106 09/24/2017   CALCIUM 9.7 09/24/2017                        Neuropathy Markers Lab Results  Component Value Date   HGBA1C 6.6 (H) 09/25/2017   HIV Non Reactive 09/24/2017                        CNS Tests No results found for: COLORCSF, APPEARCSF, RBCCOUNTCSF, WBCCSF, POLYSCSF, LYMPHSCSF, EOSCSF, PROTEINCSF, GLUCCSF, JCVIRUS, CSFOLI, IGGCSF                       Bone Pathology Markers No results found for: VD25OH, VD125OH2TOT, [id number], [id number], 25OHVITD1, 25OHVITD2, 25OHVITD3, TESTOFREE, TESTOSTERONE                       Coagulation Parameters Lab Results  Component Value Date   PLT 308 09/26/2017                        Cardiovascular Markers Lab Results  Component Value Date   HGB 13.2 09/26/2017   HCT 39.9 09/26/2017  CA Markers No results found for: CEA, CA125, LABCA2                      Note: Lab results reviewed.  PFSH  Drug: Mr. Debono  reports that he does not use drugs. Alcohol:  reports that he does not drink alcohol. Tobacco:  reports that he has never smoked. He has never used smokeless tobacco. Medical:  has a past medical history of Asthma and Hypertension. Family: family history includes Allergies in his sister.  Past Surgical History:  Procedure Laterality Date  . CHOLECYSTECTOMY    . LUNG SURGERY     "ligation of thoracic duct"   Active Ambulatory Problems    Diagnosis Date Noted  . Asthma, chronic 08/04/2013  . Cellulitis and abscess of right thigh s/p I&D 09/24/2017 09/24/2017  . Hypertension    Resolved Ambulatory Problems    Diagnosis Date Noted  . HBP (high blood pressure) 08/07/2013  . Asthma    No Additional Past Medical History   Constitutional Exam  General appearance: Well nourished, well developed, and well hydrated. In no apparent acute distress Vitals:   01/07/18 1229 01/07/18 1235 01/07/18 1245 01/07/18 1254  BP: (!) 159/68 (!) 152/79 132/86 (!) 156/79  Pulse:      Resp: '20 18 16 17  '$ Temp:  (!) 97.5 F (36.4 C)  (!) 97.4 F (36.3 C)  TempSrc:      SpO2: 95% 100% 100% 100%  Weight:      Height:       BMI Assessment: Estimated body mass index is 31.57 kg/m as calculated from the following:   Height as of this encounter: '5\' 10"'$  (1.778 m).   Weight as of this encounter: 220 lb (99.8 kg).  BMI interpretation table: BMI level Category Range  association with higher incidence of chronic pain  <18 kg/m2 Underweight   18.5-24.9 kg/m2 Ideal body weight   25-29.9 kg/m2 Overweight Increased incidence by 20%  30-34.9 kg/m2 Obese (Class I) Increased incidence by 68%  35-39.9 kg/m2 Severe obesity (Class II) Increased incidence by 136%  >40 kg/m2 Extreme obesity (Class III) Increased incidence by 254%   Patient's current BMI Ideal Body weight  Body mass index is 31.57 kg/m. Ideal body weight: 73 kg (160 lb 15 oz) Adjusted ideal body weight: 83.7 kg (184 lb 9 oz)   BMI Readings from Last 4 Encounters:  01/07/18 31.57 kg/m  09/24/17 32.62 kg/m  08/04/13 32.00 kg/m   Wt Readings from Last 4 Encounters:  01/07/18 220 lb (99.8 kg)  09/24/17 220 lb 14.4 oz (100.2 kg)  08/04/13 223 lb (101.2 kg)  Psych/Mental status: Alert, oriented x 3 (person, place, & time)       Eyes: PERLA Respiratory: No evidence of acute respiratory distress  Cervical Spine Area Exam  Skin & Axial Inspection: No masses, redness, edema, swelling, or associated skin lesions Alignment: Symmetrical Functional ROM: Unrestricted ROM      Stability: No instability detected Muscle Tone/Strength: Functionally intact. No obvious neuro-muscular anomalies detected. Sensory (Neurological): Musculoskeletal pain pattern and articular  Palpation: Complains of area being tender to palpation               Lumbar Spine Area Exam  Skin & Axial Inspection: No masses, redness, or swelling Alignment: Symmetrical Functional ROM: Unrestricted ROM       Stability: No instability detected Muscle Tone/Strength: Functionally intact. No obvious neuro-muscular anomalies detected. Sensory (Neurological): Unimpaired Palpation: No palpable anomalies  Provocative Tests: Hyperextension/rotation test: deferred today       Lumbar quadrant test (Kemp's test): deferred today       Lateral bending test: deferred today       Patrick's Maneuver: deferred today                    FABER test: deferred today                   S-I anterior distraction/compression test: deferred today         S-I lateral compression test: deferred today         S-I Thigh-thrust test: deferred today         S-I Gaenslen's test: deferred today          Gait & Posture Assessment  Ambulation: Unassisted Gait: Relatively normal for age and body habitus Posture: WNL   Lower Extremity Exam    Side: Right lower extremity  Side: Left lower extremity  Stability: No instability observed          Stability: No instability observed          Skin & Extremity Inspection: Skin color, temperature, and hair growth are WNL. No peripheral edema or cyanosis. No masses, redness, swelling, asymmetry, or associated skin lesions. No contractures.  Skin & Extremity Inspection: Skin color, temperature, and hair growth are WNL. No peripheral edema or cyanosis. No masses, redness, swelling, asymmetry, or associated skin lesions. No contractures.  Functional ROM: Unrestricted ROM                  Functional ROM: Unrestricted ROM                  Muscle Tone/Strength: Functionally intact. No obvious neuro-muscular anomalies detected.  Muscle Tone/Strength: Functionally intact. No obvious neuro-muscular anomalies detected.  Sensory (Neurological): Unimpaired  Sensory (Neurological): Unimpaired  Palpation: No palpable anomalies  Palpation: No palpable anomalies    Procedure  57 year old male with a chief complaint of neck pain, shoulder pain along with occipital headaches secondary to cervical facet joint syndrome.  Patient cervical MRI shows cervical facet arthropathy most pronounced at C3 and C4 along with C4 and C5.  Patient also has facet arthropathy along C2/C3.  Pilar Jarvis has a history of greater than 3 months of moderate to severe pain which is resulted in functional impairment.  The patient has tried various conservative therapeutic options such as NSAIDs, Tylenol, muscle relaxants, physical therapy  which was inadequately effective.  Patient's pain is predominantly axial with physical exam findings suggestive of facet arthropathy.  Cervical facet medial branch nerve blocks were discussed with the patient.  Risks and benefits were reviewed.  Patient would like to proceed with bilateral C3,4,5 facet medial branch nerve block.  Orders Placed This Encounter  Procedures  . CERVICAL FACET (MEDIAL BRANCH NERVE BLOCK)     Scheduling Instructions:     Side: Bilateral     Level: C3, C4, C5, Medial Branch Nerve     Sedation: With Sedation.     Timeframe: Today    Order Specific Question:   Where will this procedure be performed?    Answer:   [facility]  . DG C-Arm 1-60 Min-No Report    Intraoperative interpretation by procedural physician at [place].    Standing Status:   Standing    Number of Occurrences:   1    Order Specific Question:   Reason for exam:  Answer:   Assistance in needle guidance and placement for procedures requiring needle placement in or near specific anatomical locations not easily accessible without such assistance.

## 2018-01-07 NOTE — Progress Notes (Signed)
Patient's Name: Tony Benson  MRN: [id number]  Referring Provider: Nathanial Rancher, MD  DOB: 11-Aug-1960  PCP: Casilda Carls, MD (Inactive)  DOS: 01/07/2018  Note by: Gillis Santa, MD  Service setting: Ambulatory outpatient  Specialty: Interventional Pain Management  Patient type: Established  Location: [facility] (AMB) Pain Management Facility  Visit type: Interventional Procedure   Primary Reason for Visit: Interventional Pain Management Treatment. CC: Neck Pain (right sided)  Procedure:          Anesthesia, Analgesia, Anxiolysis:  Type: Cervical Facet Medial Branch Block(s)  #1  Primary Purpose: Diagnostic Region: Posterolateral cervical spine Level: C3, C4, C5,Medial Branch Level(s). Injecting these levels blocks the C3-4, C4-5, cervical facet joints. Laterality: Bilateral  Type: Moderate (Conscious) Sedation combined with Local Anesthesia Indication(s): Analgesia and Anxiety Route: Intravenous (IV) IV Access: Secured Sedation: Meaningful verbal contact was maintained at all times during the procedure  Local Anesthetic: Lidocaine 1-2%   Position: Prone with head of the table raised to facilitate breathing.   Indications: 1. Cervical facet joint syndrome (C2,3,4,5)   2. Arthropathy of cervical facet joint   3. Cervicalgia   4. Chronic pain syndrome    Pain Score: Pre-procedure: 8 /10 Post-procedure: 0-No pain/10  Pre-op Assessment:  Mr. Tony Benson is a 57 y.o. (year old), male patient, seen today for interventional treatment. He  has a past surgical history that includes Lung surgery and Cholecystectomy. Mr. Tony Benson has a current medication list which includes the following prescription(s): amlodipine, gabapentin, glipizide, losartan, metformin, metoprolol tartrate, omega-3 fatty acids, vitamin d (ergocalciferol), and vitamin e, and the following Facility-Administered Medications: fentanyl. His primarily concern today is the Neck Pain (right sided)  Initial Vital Signs:  Pulse/HCG Rate:  86ECG Heart Rate: 94 Temp: 98.2 F (36.8 C) Resp: 18 BP: (!) 158/94 SpO2: 99 %  BMI: Estimated body mass index is 31.57 kg/m as calculated from the following:   Height as of this encounter: 5\' 10"  (1.778 m).   Weight as of this encounter: 220 lb (99.8 kg).  Risk Assessment: Allergies: Reviewed. He is allergic to other and morphine and related.  Allergy Precautions: None required Coagulopathies: Reviewed. None identified.  Blood-thinner therapy: None at this time Active Infection(s): Reviewed. None identified. Mr. Tony Benson is afebrile  Site Confirmation: Mr. Tony Benson was asked to confirm the procedure and laterality before marking the site Procedure checklist: Completed Consent: Before the procedure and under the influence of no sedative(s), amnesic(s), or anxiolytics, the patient was informed of the treatment options, risks and possible complications. To fulfill our ethical and legal obligations, as recommended by the American Medical Association's Code of Ethics, I have informed the patient of my clinical impression; the nature and purpose of the treatment or procedure; the risks, benefits, and possible complications of the intervention; the alternatives, including doing nothing; the risk(s) and benefit(s) of the alternative treatment(s) or procedure(s); and the risk(s) and benefit(s) of doing nothing. The patient was provided information about the general risks and possible complications associated with the procedure. These may include, but are not limited to: failure to achieve desired goals, infection, bleeding, organ or nerve damage, allergic reactions, paralysis, and death. In addition, the patient was informed of those risks and complications associated to Spine-related procedures, such as failure to decrease pain; infection (i.e.: Meningitis, epidural or intraspinal abscess); bleeding (i.e.: epidural hematoma, subarachnoid hemorrhage, or any other type of intraspinal or peri-dural  bleeding); organ or nerve damage (i.e.: Any type of peripheral nerve, nerve root, or spinal cord injury) with subsequent  damage to sensory, motor, and/or autonomic systems, resulting in permanent pain, numbness, and/or weakness of one or several areas of the body; allergic reactions; (i.e.: anaphylactic reaction); and/or death. Furthermore, the patient was informed of those risks and complications associated with the medications. These include, but are not limited to: allergic reactions (i.e.: anaphylactic or anaphylactoid reaction(s)); adrenal axis suppression; blood sugar elevation that in diabetics may result in ketoacidosis or comma; water retention that in patients with history of congestive heart failure may result in shortness of breath, pulmonary edema, and decompensation with resultant heart failure; weight gain; swelling or edema; medication-induced neural toxicity; particulate matter embolism and blood vessel occlusion with resultant organ, and/or nervous system infarction; and/or aseptic necrosis of one or more joints. Finally, the patient was informed that Medicine is not an exact science; therefore, there is also the possibility of unforeseen or unpredictable risks and/or possible complications that may result in a catastrophic outcome. The patient indicated having understood very clearly. We have given the patient no guarantees and we have made no promises. Enough time was given to the patient to ask questions, all of which were answered to the patient's satisfaction. Mr. Tony Benson has indicated that he wanted to continue with the procedure. Attestation: I, the ordering provider, attest that I have discussed with the patient the benefits, risks, side-effects, alternatives, likelihood of achieving goals, and potential problems during recovery for the procedure that I have provided informed consent. Date  Time: 01/07/2018 11:14 AM  Pre-Procedure Preparation:  Monitoring: As per clinic protocol.  Respiration, ETCO2, SpO2, BP, heart rate and rhythm monitor placed and checked for adequate function Safety Precautions: Patient was assessed for positional comfort and pressure points before starting the procedure. Time-out: I initiated and conducted the "Time-out" before starting the procedure, as per protocol. The patient was asked to participate by confirming the accuracy of the "Time Out" information. Verification of the correct person, site, and procedure were performed and confirmed by me, the nursing staff, and the patient. "Time-out" conducted as per Joint Commission's Universal Protocol (UP.01.01.01). Time: 1213  Description of Procedure:          Laterality: Bilateral. The procedure was performed in identical fashion on both sides. Level: C3, C4, C5, Medial Branch Level(s). Area Prepped: Posterior Cervico-thoracic Region Prepping solution: ChloraPrep (2% chlorhexidine gluconate and 70% isopropyl alcohol) Safety Precautions: Aspiration looking for blood return was conducted prior to all injections. At no point did we inject any substances, as a needle was being advanced. Before injecting, the patient was told to immediately notify me if he was experiencing any new onset of "ringing in the ears, or metallic taste in the mouth". No attempts were made at seeking any paresthesias. Safe injection practices and needle disposal techniques used. Medications properly checked for expiration dates. SDV (single dose vial) medications used. After the completion of the procedure, all disposable equipment used was discarded in the proper designated medical waste containers. Local Anesthesia: Protocol guidelines were followed. The patient was positioned over the fluoroscopy table. The area was prepped in the usual manner. The time-out was completed. The target area was identified using fluoroscopy. A 12-in long, straight, sterile hemostat was used with fluoroscopic guidance to locate the targets for each level  blocked. Once located, the skin was marked with an approved surgical skin marker. Once all sites were marked, the skin (epidermis, dermis, and hypodermis), as well as deeper tissues (fat, connective tissue and muscle) were infiltrated with a small amount of a short-acting local anesthetic, loaded  on a 10cc syringe with a 25G, 1.5-in  Needle. An appropriate amount of time was allowed for local anesthetics to take effect before proceeding to the next step. Local Anesthetic: Lidocaine 2.0% The unused portion of the local anesthetic was discarded in the proper designated containers. Technical explanation of process:   C3 Medial Branch Nerve Block (MBB): The target area for the C3 dorsal medial articular branch is the lateral concave waist of the articular pillar of C3. Under fluoroscopic guidance, a Quincke needle was inserted until contact was made with os over the postero-lateral aspect of the articular pillar of C3 (target area). After negative aspiration for blood, 1 mL of the nerve block solution was injected without difficulty or complication. The needle was removed intact. C4 Medial Branch Nerve Block (MBB): The target area for the C4 dorsal medial articular branch is the lateral concave waist of the articular pillar of C4. Under fluoroscopic guidance, a Quincke needle was inserted until contact was made with os over the postero-lateral aspect of the articular pillar of C4 (target area). After negative aspiration for blood, 1mL of the nerve block solution was injected without difficulty or complication. The needle was removed intact. C5 Medial Branch Nerve Block (MBB): The target area for the C5 dorsal medial articular branch is the lateral concave waist of the articular pillar of C5. Under fluoroscopic guidance, a Quincke needle was inserted until contact was made with os over the postero-lateral aspect of the articular pillar of C5 (target area). After negative aspiration for blood, 4mL of the nerve block  solution was injected without difficulty or complication. The needle was removed intact.   Nerve block solution: 10 cc solution made of 9 cc of 0.2% ropivacaine, 1 cc of Decadron 10 mg/cc.  1 to 1.5 cc injected at each level above bilaterally.  The unused portion of the solution was discarded in the proper designated containers.  Once the entire procedure was completed, the treated area was cleaned, making sure to leave some of the prepping solution back to take advantage of its long term bactericidal properties.  Vitals:   01/07/18 1229 01/07/18 1235 01/07/18 1245 01/07/18 1254  BP: (!) 159/68 (!) 152/79 132/86 (!) 156/79  Pulse:      Resp: 20 18 16 17   Temp:  (!) 97.5 F (36.4 C)  (!) 97.4 F (36.3 C)  TempSrc:      SpO2: 95% 100% 100% 100%  Weight:      Height:        Start Time: 1213 hrs. End Time: 1228 hrs.  Imaging Guidance (Spinal):          Type of Imaging Technique: Fluoroscopy Guidance (Spinal) Indication(s): Assistance in needle guidance and placement for procedures requiring needle placement in or near specific anatomical locations not easily accessible without such assistance. Exposure Time: Please see nurses notes. Contrast: None used. Fluoroscopic Guidance: I was personally present during the use of fluoroscopy. "Tunnel Vision Technique" used to obtain the best possible view of the target area. Parallax error corrected before commencing the procedure. "Direction-depth-direction" technique used to introduce the needle under continuous pulsed fluoroscopy. Once target was reached, antero-posterior, oblique, and lateral fluoroscopic projection used confirm needle placement in all planes. Images permanently stored in EMR. Interpretation: No contrast injected. I personally interpreted the imaging intraoperatively. Adequate needle placement confirmed in multiple planes. Permanent images saved into the patient's record.  Antibiotic Prophylaxis:   Anti-infectives (From  admission, onward)   None     Indication(s): None  identified  Post-operative Assessment:  Post-procedure Vital Signs:  Pulse/HCG Rate: 9486 Temp: (!) 97.4 F (36.3 C) Resp: 17 BP: (!) 156/79 SpO2: 100 %  EBL: None  Complications: No immediate post-treatment complications observed by team, or reported by patient.  Note: The patient tolerated the entire procedure well. A repeat set of vitals were taken after the procedure and the patient was kept under observation following institutional policy, for this type of procedure. Post-procedural neurological assessment was performed, showing return to baseline, prior to discharge. The patient was provided with post-procedure discharge instructions, including a section on how to identify potential problems. Should any problems arise concerning this procedure, the patient was given instructions to immediately contact us, at any time, without hesitation. In any case, we plan to contact the patient by telephone for a follow-up status report regarding this interventional procedure.  Comments:  No additional relevant information.  Plan of Care   Imaging Orders     DG C-Arm 1-60 Min-No Report  Procedure Orders     CERVICAL FACET (MEDIAL BRANCH NERVE BLOCK)   Medications ordered for procedure: Meds ordered this encounter  Medications  . lactated ringers infusion 1,000 mL  . fentaNYL (SUBLIMAZE) injection 25-100 mcg    Make sure Narcan is available in the pyxis when using this medication. In the event of respiratory depression (RR< 8/min): Titrate NARCAN (naloxone) in increments of 0.1 to 0.2 mg IV at 2-3 minute intervals, until desired degree of reversal.  . ropivacaine (PF) 2 mg/mL (0.2%) (NAROPIN) injection 10 mL  . lidocaine (XYLOCAINE) 2 % (with pres) injection 400 mg  . dexamethasone (DECADRON) injection 10 mg   Medications administered: We administered lactated ringers, fentaNYL, ropivacaine (PF) 2 mg/mL (0.2%), lidocaine, and  dexamethasone.  See the medical record for exact dosing, route, and time of administration.  New Prescriptions   No medications on file   Disposition: Discharge home  Discharge Date & Time: 01/07/2018; 1256 hrs.   Physician-requested Follow-up: Return in about 4 weeks (around 02/04/2018) for Post Procedure Evaluation.  Future Appointments  Date Time Provider [place]  02/05/2018 11:45 AM Gillis Santa, MD [facility] None   Primary Care Physician: Casilda Carls, MD (Inactive) Location: [facility] Note by: Gillis Santa, MD Date: 01/07/2018; Time: 1:40 PM  Disclaimer:  Medicine is not an exact science. The only guarantee in medicine is that nothing is guaranteed. It is important to note that the decision to proceed with this intervention was based on the information collected from the patient. The Data and conclusions were drawn from the patient's questionnaire, the interview, and the physical examination. Because the information was provided in large part by the patient, it cannot be guaranteed that it has not been purposely or unconsciously manipulated. Every effort has been made to obtain as much relevant data as possible for this evaluation. It is important to note that the conclusions that lead to this procedure are derived in large part from the available data. Always take into account that the treatment will also be dependent on availability of resources and existing treatment guidelines, considered by other Pain Management Practitioners as being common knowledge and practice, at the time of the intervention. For Medico-Legal purposes, it is also important to point out that variation in procedural techniques and pharmacological choices are the acceptable norm. The indications, contraindications, technique, and results of the above procedure should only be interpreted and judged by a Board-Certified Interventional Pain Specialist with extensive familiarity  and expertise in the  same exact procedure and technique.

## 2018-01-08 ENCOUNTER — Telehealth: Payer: Self-pay | Admitting: *Deleted

## 2018-01-08 ENCOUNTER — Ambulatory Visit: Payer: BLUE CROSS/BLUE SHIELD | Admitting: Student in an Organized Health Care Education/Training Program

## 2018-01-08 NOTE — Telephone Encounter (Signed)
Attempted to call for post procedure follow-up. Message left. 

## 2018-01-08 NOTE — Telephone Encounter (Signed)
Patient returned phone call from f/up call, states that he is doing well from procedure, no c/o.

## 2018-02-05 ENCOUNTER — Encounter: Payer: Self-pay | Admitting: Student in an Organized Health Care Education/Training Program

## 2018-02-05 ENCOUNTER — Ambulatory Visit
Payer: BLUE CROSS/BLUE SHIELD | Attending: Student in an Organized Health Care Education/Training Program | Admitting: Student in an Organized Health Care Education/Training Program

## 2018-02-05 ENCOUNTER — Other Ambulatory Visit: Payer: Self-pay

## 2018-02-05 VITALS — BP 155/84 | HR 86 | Temp 98.6°F | Resp 16 | Ht 70.0 in | Wt 220.0 lb

## 2018-02-05 DIAGNOSIS — Z79899 Other long term (current) drug therapy: Secondary | ICD-10-CM | POA: Insufficient documentation

## 2018-02-05 DIAGNOSIS — J45909 Unspecified asthma, uncomplicated: Secondary | ICD-10-CM | POA: Diagnosis not present

## 2018-02-05 DIAGNOSIS — M542 Cervicalgia: Secondary | ICD-10-CM | POA: Insufficient documentation

## 2018-02-05 DIAGNOSIS — M47812 Spondylosis without myelopathy or radiculopathy, cervical region: Secondary | ICD-10-CM | POA: Insufficient documentation

## 2018-02-05 DIAGNOSIS — I10 Essential (primary) hypertension: Secondary | ICD-10-CM | POA: Insufficient documentation

## 2018-02-05 DIAGNOSIS — G894 Chronic pain syndrome: Secondary | ICD-10-CM | POA: Diagnosis not present

## 2018-02-05 DIAGNOSIS — L03115 Cellulitis of right lower limb: Secondary | ICD-10-CM | POA: Diagnosis not present

## 2018-02-05 DIAGNOSIS — M25511 Pain in right shoulder: Secondary | ICD-10-CM | POA: Insufficient documentation

## 2018-02-05 DIAGNOSIS — Z888 Allergy status to other drugs, medicaments and biological substances status: Secondary | ICD-10-CM | POA: Diagnosis not present

## 2018-02-05 DIAGNOSIS — M5011 Cervical disc disorder with radiculopathy,  high cervical region: Secondary | ICD-10-CM | POA: Diagnosis not present

## 2018-02-05 NOTE — Patient Instructions (Signed)
GENERAL RISKS AND COMPLICATIONS  What are the risk, side effects and possible complications? Generally speaking, most procedures are safe.  However, with any procedure there are risks, side effects, and the possibility of complications.  The risks and complications are dependent upon the sites that are lesioned, or the type of nerve block to be performed.  The closer the procedure is to the spine, the more serious the risks are.  Great care is taken when placing the radio frequency needles, block needles or lesioning probes, but sometimes complications can occur. 1. Infection: Any time there is an injection through the skin, there is a risk of infection.  This is why sterile conditions are used for these blocks.  There are four possible types of infection. 1. Localized skin infection. 2. Central Nervous System Infection-This can be in the form of Meningitis, which can be deadly. 3. Epidural Infections-This can be in the form of an epidural abscess, which can cause pressure inside of the spine, causing compression of the spinal cord with subsequent paralysis. This would require an emergency surgery to decompress, and there are no guarantees that the patient would recover from the paralysis. 4. Discitis-This is an infection of the intervertebral discs.  It occurs in about 1% of discography procedures.  It is difficult to treat and it may lead to surgery.        2. Pain: the needles have to go through skin and soft tissues, will cause soreness.       3. Damage to internal structures:  The nerves to be lesioned may be near blood vessels or    other nerves which can be potentially damaged.       4. Bleeding: Bleeding is more common if the patient is taking blood thinners such as  aspirin, Coumadin, Ticiid, Plavix, etc., or if he/she have some genetic predisposition  such as hemophilia. Bleeding into the spinal canal can cause compression of the spinal  cord with subsequent paralysis.  This would require an  emergency surgery to  decompress and there are no guarantees that the patient would recover from the  paralysis.       5. Pneumothorax:  Puncturing of a lung is a possibility, every time a needle is introduced in  the area of the chest or upper back.  Pneumothorax refers to free air around the  collapsed lung(s), inside of the thoracic cavity (chest cavity).  Another two possible  complications related to a similar event would include: Hemothorax and Chylothorax.   These are variations of the Pneumothorax, where instead of air around the collapsed  lung(s), you may have blood or chyle, respectively.       6. Spinal headaches: They may occur with any procedures in the area of the spine.       7. Persistent CSF (Cerebro-Spinal Fluid) leakage: This is a rare problem, but may occur  with prolonged intrathecal or epidural catheters either due to the formation of a fistulous  track or a dural tear.       8. Nerve damage: By working so close to the spinal cord, there is always a possibility of  nerve damage, which could be as serious as a permanent spinal cord injury with  paralysis.       9. Death:  Although rare, severe deadly allergic reactions known as "Anaphylactic  reaction" can occur to any of the medications used.      10. Worsening of the symptoms:  We can always make thing worse.    What are the chances of something like this happening? Chances of any of this occuring are extremely low.  By statistics, you have more of a chance of getting killed in a motor vehicle accident: while driving to the hospital than any of the above occurring .  Nevertheless, you should be aware that they are possibilities.  In general, it is similar to taking a shower.  Everybody knows that you can slip, hit your head and get killed.  Does that mean that you should not shower again?  Nevertheless always keep in mind that statistics do not mean anything if you happen to be on the wrong side of them.  Even if a procedure has a 1  (one) in a 1,000,000 (million) chance of going wrong, it you happen to be that one..Also, keep in mind that by statistics, you have more of a chance of having something go wrong when taking medications.  Who should not have this procedure? If you are on a blood thinning medication (e.g. Coumadin, Plavix, see list of "Blood Thinners"), or if you have an active infection going on, you should not have the procedure.  If you are taking any blood thinners, please inform your physician.  How should I prepare for this procedure?  Do not eat or drink anything at least six hours prior to the procedure.  Bring a driver with you .  It cannot be a taxi.  Come accompanied by an adult that can drive you back, and that is strong enough to help you if your legs get weak or numb from the local anesthetic.  Take all of your medicines the morning of the procedure with just enough water to swallow them.  If you have diabetes, make sure that you are scheduled to have your procedure done first thing in the morning, whenever possible.  If you have diabetes, take only half of your insulin dose and notify our nurse that you have done so as soon as you arrive at the clinic.  If you are diabetic, but only take blood sugar pills (oral hypoglycemic), then do not take them on the morning of your procedure.  You may take them after you have had the procedure.  Do not take aspirin or any aspirin-containing medications, at least eleven (11) days prior to the procedure.  They may prolong bleeding.  Wear loose fitting clothing that may be easy to take off and that you would not mind if it got stained with Betadine or blood.  Do not wear any jewelry or perfume  Remove any nail coloring.  It will interfere with some of our monitoring equipment.  NOTE: Remember that this is not meant to be interpreted as a complete list of all possible complications.  Unforeseen problems may occur.  BLOOD THINNERS The following drugs  contain aspirin or other products, which can cause increased bleeding during surgery and should not be taken for 2 weeks prior to and 1 week after surgery.  If you should need take something for relief of minor pain, you may take acetaminophen which is found in Tylenol,m Datril, Anacin-3 and Panadol. It is not blood thinner. The products listed below are.  Do not take any of the products listed below in addition to any listed on your instruction sheet.  A.P.C or A.P.C with Codeine Codeine Phosphate Capsules #3 Ibuprofen Ridaura  ABC compound Congesprin Imuran rimadil  Advil Cope Indocin Robaxisal  Alka-Seltzer Effervescent Pain Reliever and Antacid Coricidin or Coricidin-D  Indomethacin Rufen    Alka-Seltzer plus Cold Medicine Cosprin Ketoprofen S-A-C Tablets  Anacin Analgesic Tablets or Capsules Coumadin Korlgesic Salflex  Anacin Extra Strength Analgesic tablets or capsules CP-2 Tablets Lanoril Salicylate  Anaprox Cuprimine Capsules Levenox Salocol  Anexsia-D Dalteparin Magan Salsalate  Anodynos Darvon compound Magnesium Salicylate Sine-off  Ansaid Dasin Capsules Magsal Sodium Salicylate  Anturane Depen Capsules Marnal Soma  APF Arthritis pain formula Dewitt's Pills Measurin Stanback  Argesic Dia-Gesic Meclofenamic Sulfinpyrazone  Arthritis Bayer Timed Release Aspirin Diclofenac Meclomen Sulindac  Arthritis pain formula Anacin Dicumarol Medipren Supac  Analgesic (Safety coated) Arthralgen Diffunasal Mefanamic Suprofen  Arthritis Strength Bufferin Dihydrocodeine Mepro Compound Suprol  Arthropan liquid Dopirydamole Methcarbomol with Aspirin Synalgos  ASA tablets/Enseals Disalcid Micrainin Tagament  Ascriptin Doan's Midol Talwin  Ascriptin A/D Dolene Mobidin Tanderil  Ascriptin Extra Strength Dolobid Moblgesic Ticlid  Ascriptin with Codeine Doloprin or Doloprin with Codeine Momentum Tolectin  Asperbuf Duoprin Mono-gesic Trendar  Aspergum Duradyne Motrin or Motrin IB Triminicin  Aspirin  plain, buffered or enteric coated Durasal Myochrisine Trigesic  Aspirin Suppositories Easprin Nalfon Trillsate  Aspirin with Codeine Ecotrin Regular or Extra Strength Naprosyn Uracel  Atromid-S Efficin Naproxen Ursinus  Auranofin Capsules Elmiron Neocylate Vanquish  Axotal Emagrin Norgesic Verin  Azathioprine Empirin or Empirin with Codeine Normiflo Vitamin E  Azolid Emprazil Nuprin Voltaren  Bayer Aspirin plain, buffered or children's or timed BC Tablets or powders Encaprin Orgaran Warfarin Sodium  Buff-a-Comp Enoxaparin Orudis Zorpin  Buff-a-Comp with Codeine Equegesic Os-Cal-Gesic   Buffaprin Excedrin plain, buffered or Extra Strength Oxalid   Bufferin Arthritis Strength Feldene Oxphenbutazone   Bufferin plain or Extra Strength Feldene Capsules Oxycodone with Aspirin   Bufferin with Codeine Fenoprofen Fenoprofen Pabalate or Pabalate-SF   Buffets II Flogesic Panagesic   Buffinol plain or Extra Strength Florinal or Florinal with Codeine Panwarfarin   Buf-Tabs Flurbiprofen Penicillamine   Butalbital Compound Four-way cold tablets Penicillin   Butazolidin Fragmin Pepto-Bismol   Carbenicillin Geminisyn Percodan   Carna Arthritis Reliever Geopen Persantine   Carprofen Gold's salt Persistin   Chloramphenicol Goody's Phenylbutazone   Chloromycetin Haltrain Piroxlcam   Clmetidine heparin Plaquenil   Cllnoril Hyco-pap Ponstel   Clofibrate Hydroxy chloroquine Propoxyphen         Before stopping any of these medications, be sure to consult the physician who ordered them.  Some, such as Coumadin (Warfarin) are ordered to prevent or treat serious conditions such as "deep thrombosis", "pumonary embolisms", and other heart problems.  The amount of time that you may need off of the medication may also vary with the medication and the reason for which you were taking it.  If you are taking any of these medications, please make sure you notify your pain physician before you undergo any  procedures.         Moderate Conscious Sedation, Adult Sedation is the use of medicines to promote relaxation and relieve discomfort and anxiety. Moderate conscious sedation is a type of sedation. Under moderate conscious sedation, you are less alert than normal, but you are still able to respond to instructions, touch, or both. Moderate conscious sedation is used during short medical and dental procedures. It is milder than deep sedation, which is a type of sedation under which you cannot be easily woken up. It is also milder than general anesthesia, which is the use of medicines to make you unconscious. Moderate conscious sedation allows you to return to your regular activities sooner. Tell a health care provider about:  Any allergies you have.  All medicines you are  taking, including vitamins, herbs, eye drops, creams, and over-the-counter medicines.  Use of steroids (by mouth or creams).  Any problems you or family members have had with sedatives and anesthetic medicines.  Any blood disorders you have.  Any surgeries you have had.  Any medical conditions you have, such as sleep apnea.  Whether you are pregnant or may be pregnant.  Any use of cigarettes, alcohol, marijuana, or street drugs. What are the risks? Generally, this is a safe procedure. However, problems may occur, including:  Getting too much medicine (oversedation).  Nausea.  Allergic reaction to medicines.  Trouble breathing. If this happens, a breathing tube may be used to help with breathing. It will be removed when you are awake and breathing on your own.  Heart trouble.  Lung trouble.  What happens before the procedure? Staying hydrated Follow instructions from your health care provider about hydration, which may include:  Up to 2 hours before the procedure - you may continue to drink clear liquids, such as water, clear fruit juice, black coffee, and plain tea.  Eating and drinking  restrictions Follow instructions from your health care provider about eating and drinking, which may include:  8 hours before the procedure - stop eating heavy meals or foods such as meat, fried foods, or fatty foods.  6 hours before the procedure - stop eating light meals or foods, such as toast or cereal.  6 hours before the procedure - stop drinking milk or drinks that contain milk.  2 hours before the procedure - stop drinking clear liquids.  Medicine  Ask your health care provider about:  Changing or stopping your regular medicines. This is especially important if you are taking diabetes medicines or blood thinners.  Taking medicines such as aspirin and ibuprofen. These medicines can thin your blood. Do not take these medicines before your procedure if your health care provider instructs you not to.  Tests and exams  You will have a physical exam.  You may have blood tests done to show: ? How well your kidneys and liver are working. ? How well your blood can clot. General instructions  Plan to have someone take you home from the hospital or clinic.  If you will be going home right after the procedure, plan to have someone with you for 24 hours. What happens during the procedure?  An IV tube will be inserted into one of your veins.  Medicine to help you relax (sedative) will be given through the IV tube.  The medical or dental procedure will be performed. What happens after the procedure?  Your blood pressure, heart rate, breathing rate, and blood oxygen level will be monitored often until the medicines you were given have worn off.  Do not drive for 24 hours. This information is not intended to replace advice given to you by your health care provider. Make sure you discuss any questions you have with your health care provider. Document Released: 12/25/2000 Document Revised: 09/05/2015 Document Reviewed: 07/22/2015 Elsevier Interactive Patient Education  2018  [facility] Patient Information  Description: The facets are joints in the spine between the vertebrae.  Like any joints in the body, facets can become irritated and painful.  Arthritis can also effect the facets.  By injecting steroids and local anesthetic in and around these joints, we can temporarily block the nerve supply to them.  Steroids act directly on irritated nerves and tissues to reduce selling and inflammation which often leads to decreased pain.  Facet blocks may be done anywhere along the spine from the neck to the low back depending upon the location of your pain.   After numbing the skin with local anesthetic (like Novocaine), a small needle is passed onto the facet joints under x-ray guidance.  You may experience a sensation of pressure while this is being done.  The entire block usually lasts about 15-25 minutes.   Conditions which may be treated by facet blocks:   Low back/buttock pain  Neck/shoulder pain  Certain types of headaches  Preparation for the injection:  1. Do not eat any solid food or dairy products within 8 hours of your appointment. 2. You may drink clear liquid up to 3 hours before appointment.  Clear liquids include water, black coffee, juice or soda.  No milk or cream please. 3. You may take your regular medication, including pain medications, with a sip of water before your appointment.  Diabetics should hold regular insulin (if taken separately) and take 1/2 normal NPH dose the morning of the procedure.  Carry some sugar containing items with you to your appointment. 4. A driver must accompany you and be prepared to drive you home after your procedure. 5. Bring all your current medications with you. 6. An IV may be inserted and sedation may be given at the discretion of the physician. 7. A blood pressure cuff, EKG and other monitors will often be applied during the procedure.  Some patients may need to have extra oxygen administered for a  short period. 8. You will be asked to provide medical information, including your allergies and medications, prior to the procedure.  We must know immediately if you are taking blood thinners (like Coumadin/Warfarin) or if you are allergic to IV iodine contrast (dye).  We must know if you could possible be pregnant.  Possible side-effects:   Bleeding from needle site  Infection (rare, may require surgery)  Nerve injury (rare)  Numbness & tingling (temporary)  Difficulty urinating (rare, temporary)  Spinal headache (a headache worse with upright posture)  Light-headedness (temporary)  Pain at injection site (serveral days)  Decreased blood pressure (rare, temporary)  Weakness in arm/leg (temporary)  Pressure sensation in back/neck (temporary)   Call if you experience:   Fever/chills associated with headache or increased back/neck pain  Headache worsened by an upright position  New onset, weakness or numbness of an extremity below the injection site  Hives or difficulty breathing (go to the emergency room)  Inflammation or drainage at the injection site(s)  Severe back/neck pain greater than usual  New symptoms which are concerning to you  Please note:  Although the local anesthetic injected can often make your back or neck feel good for several hours after the injection, the pain will likely return. It takes 3-7 days for steroids to work.  You may not notice any pain relief for at least one week.  If effective, we will often do a series of 2-3 injections spaced 3-6 weeks apart to maximally decrease your pain.  After the initial series, you may be a candidate for a more permanent nerve block of the facets.  If you have any questions, please call #[phone]) [facility]

## 2018-02-05 NOTE — Progress Notes (Signed)
Patient's Name: Tony Benson  MRN: [id number]  Referring Provider: No ref. provider found  DOB: 1960-05-03  PCP: Casilda Carls, MD  DOS: 02/05/2018  Note by: Gillis Santa, MD  Service setting: Ambulatory outpatient  Specialty: Interventional Pain Management  Location: [facility] (AMB) Pain Management Facility    Patient type: Established   Primary Reason(s) for Visit: Encounter for post-procedure evaluation of chronic illness with mild to moderate exacerbation CC: Shoulder Pain (right) and Neck Pain  HPI  Tony Benson is a 57 y.o. year old, male patient, who comes today for a post-procedure evaluation. He has Asthma, chronic; Cellulitis and abscess of right thigh s/p I&D 09/24/2017; and Hypertension on their problem list. His primarily concern today is the Shoulder Pain (right) and Neck Pain  Pain Assessment: Location: Right Shoulder Radiating: to back of neck Onset: More than a month ago Duration: Chronic pain Quality: Constant, Dull Severity: 8 /10 (subjective, self-reported pain score)  Note: Reported level is inconsistent with clinical observations. Clinically the patient looks like a 3/10 A 3/10 is viewed as "Moderate" and described as significantly interfering with activities of daily living (ADL). It becomes difficult to feed, bathe, get dressed, get on and off the toilet or to perform personal hygiene functions. Difficult to get in and out of bed or a chair without assistance. Very distracting. With effort, it can be ignored when deeply involved in activities. Information on the proper use of the pain scale provided to the patient today. When using our objective Pain Scale, levels between 6 and 10/10 are said to belong in an emergency room, as it progressively worsens from a 6/10, described as severely limiting, requiring emergency care not usually available at an outpatient pain management facility. At a 6/10 level, communication becomes difficult and requires great effort. Assistance to reach  the emergency department may be required. Facial flushing and profuse sweating along with potentially dangerous increases in heart rate and blood pressure will be evident. Effect on ADL: distracting Timing: Constant Modifying factors: procedure, meds, ice, hot showers BP: (!) 155/84  HR: 86  Tony Benson comes in today for post-procedure evaluation.  Further details on both, my assessment(s), as well as the proposed treatment plan, please see below.  Post-Procedure Assessment  01/07/2018 Procedure: Bilateral C3, C4, C5 facet medial branch nerve block Pre-procedure pain score:  8/10 Post-procedure pain score: 0/10         Influential Factors: BMI: 31.57 kg/m Intra-procedural challenges: None observed.         Assessment challenges: None detected.              Reported side-effects: None.        Post-procedural adverse reactions or complications: None reported         Sedation: Please see nurses note. When no sedatives are used, the analgesic levels obtained are directly associated to the effectiveness of the local anesthetics. However, when sedation is provided, the level of analgesia obtained during the initial 1 hour following the intervention, is believed to be the result of a combination of factors. These factors may include, but are not limited to: 1. The effectiveness of the local anesthetics used. 2. The effects of the analgesic(s) and/or anxiolytic(s) used. 3. The degree of discomfort experienced by the patient at the time of the procedure. 4. The patients ability and reliability in recalling and recording the events. 5. The presence and influence of possible secondary gains and/or psychosocial factors. Reported result: Relief experienced during the 1st hour after the  procedure: 100 % (Ultra-Short Term Relief)            Interpretative annotation: Clinically appropriate result. Analgesia during this period is likely to be Local Anesthetic and/or IV Sedative (Analgesic/Anxiolytic)  related.          Effects of local anesthetic: The analgesic effects attained during this period are directly associated to the localized infiltration of local anesthetics and therefore cary significant diagnostic value as to the etiological location, or anatomical origin, of the pain. Expected duration of relief is directly dependent on the pharmacodynamics of the local anesthetic used. Long-acting (4-6 hours) anesthetics used.  Reported result: Relief during the next 4 to 6 hour after the procedure: 100 % (Short-Term Relief)            Interpretative annotation: Clinically appropriate result. Analgesia during this period is likely to be Local Anesthetic-related.          Long-term benefit: Defined as the period of time past the expected duration of local anesthetics (1 hour for short-acting and 4-6 hours for long-acting). With the possible exception of prolonged sympathetic blockade from the local anesthetics, benefits during this period are typically attributed to, or associated with, other factors such as analgesic sensory neuropraxia, antiinflammatory effects, or beneficial biochemical changes provided by agents other than the local anesthetics.  Reported result: Extended relief following procedure: 100 %(for 1 day) (Long-Term Relief)            Interpretative annotation: Clinically possible results. Good relief. No permanent benefit expected. Inflammation plays a part in the etiology to the pain.          Current benefits: Defined as reported results that persistent at this point in time.   Analgesia: 50-75 %            Function: Somewhat improved ROM: Somewhat improved Interpretative annotation: Recurrence of symptoms. No permanent benefit expected. Effective diagnostic intervention.          Interpretation: Results would suggest a successful diagnostic intervention.                  Plan:  Set up procedure as a PRN palliative treatment option for this patient.                Laboratory  Chemistry  Inflammation Markers (CRP: Acute Phase) (ESR: Chronic Phase) No results found for: CRP, ESRSEDRATE, LATICACIDVEN                       Rheumatology Markers No results found for: RF, ANA, LABURIC, URICUR, LYMEIGGIGMAB, LYMEABIGMQN, HLAB27                      Renal Function Markers Lab Results  Component Value Date   BUN 13 09/24/2017   CREATININE 1.11 09/24/2017   GFRAA >60 09/24/2017   GFRNONAA >60 09/24/2017                             Hepatic Function Markers Lab Results  Component Value Date   AST 18 09/24/2017   ALT 24 09/24/2017   ALBUMIN 3.8 09/24/2017   ALKPHOS 62 09/24/2017                        Electrolytes Lab Results  Component Value Date   NA 141 09/24/2017   K 4.1 09/24/2017   CL 106 09/24/2017   CALCIUM 9.7  09/24/2017                        Neuropathy Markers Lab Results  Component Value Date   HGBA1C 6.6 (H) 09/25/2017   HIV Non Reactive 09/24/2017                        CNS Tests No results found for: COLORCSF, APPEARCSF, RBCCOUNTCSF, WBCCSF, POLYSCSF, LYMPHSCSF, EOSCSF, PROTEINCSF, GLUCCSF, JCVIRUS, CSFOLI, IGGCSF                      Bone Pathology Markers No results found for: VD25OH, ZS010XN2TFT, DD2202RK2, HC6237SE8, 25OHVITD1, 25OHVITD2, 25OHVITD3, TESTOFREE, TESTOSTERONE                       Coagulation Parameters Lab Results  Component Value Date   PLT 308 09/26/2017                        Cardiovascular Markers Lab Results  Component Value Date   HGB 13.2 09/26/2017   HCT 39.9 09/26/2017                         CA Markers No results found for: CEA, CA125, LABCA2                      Note: Lab results reviewed.  Recent Diagnostic Imaging Results  DG C-Arm 1-60 Min-No Report Fluoroscopy was utilized by the requesting physician.  No radiographic  interpretation.   Complexity Note: Imaging results reviewed. Results shared with Mr. Brenning, using Layman's terms.                         Meds   Current  Outpatient Medications:  .  amLODipine (NORVASC) 10 MG tablet, Take 10 mg by mouth daily., Disp: , Rfl: 1 .  celecoxib (CELEBREX) 200 MG capsule, Take 200 mg by mouth 2 (two) times daily., Disp: , Rfl:  .  gabapentin (NEURONTIN) 300 MG capsule, 1 CAPSULE EVERY 12 HOURS, Disp: , Rfl: 1 .  glipiZIDE (GLUCOTROL) 5 MG tablet, Take 5 mg by mouth 2 (two) times daily before a meal., Disp: , Rfl:  .  losartan (COZAAR) 100 MG tablet, Take 100 mg by mouth daily., Disp: , Rfl: 1 .  metFORMIN (GLUCOPHAGE) 500 MG tablet, Take 1,000 mg by mouth 2 (two) times daily with a meal. , Disp: , Rfl:  .  metoprolol tartrate (LOPRESSOR) 25 MG tablet, Take 25 mg by mouth 2 (two) times daily., Disp: , Rfl:  .  Omega-3 Fatty Acids (FISH OIL PO), Take 1 capsule by mouth daily., Disp: , Rfl:  .  Vitamin D, Ergocalciferol, (DRISDOL) 50000 units CAPS capsule, Take 50,000 Units by mouth every 7 (seven) days., Disp: , Rfl:  .  vitamin E 100 UNIT capsule, Take 100 Units by mouth daily., Disp: , Rfl:   ROS  Constitutional: Denies any fever or chills Gastrointestinal: No reported hemesis, hematochezia, vomiting, or acute GI distress Musculoskeletal: Denies any acute onset joint swelling, redness, loss of ROM, or weakness Neurological: No reported episodes of acute onset apraxia, aphasia, dysarthria, agnosia, amnesia, paralysis, loss of coordination, or loss of consciousness  Allergies  Mr. Chesnut is allergic to other and morphine and related.  PFSH  Drug: Mr. Matranga  reports that he does not use  drugs. Alcohol:  reports that he does not drink alcohol. Tobacco:  reports that he has never smoked. He has never used smokeless tobacco. Medical:  has a past medical history of Asthma and Hypertension. Surgical: Mr. Congrove  has a past surgical history that includes Lung surgery and Cholecystectomy. Family: family history includes Allergies in his sister.  Constitutional Exam  General appearance: Well nourished, well developed,  and well hydrated. In no apparent acute distress Vitals:   02/05/18 1136  BP: (!) 155/84  Pulse: 86  Resp: 16  Temp: 98.6 F (37 C)  SpO2: 98%  Weight: 220 lb (99.8 kg)  Height: '5\' 10"'$  (1.778 m)   BMI Assessment: Estimated body mass index is 31.57 kg/m as calculated from the following:   Height as of this encounter: '5\' 10"'$  (1.778 m).   Weight as of this encounter: 220 lb (99.8 kg).  BMI interpretation table: BMI level Category Range association with higher incidence of chronic pain  <18 kg/m2 Underweight   18.5-24.9 kg/m2 Ideal body weight   25-29.9 kg/m2 Overweight Increased incidence by 20%  30-34.9 kg/m2 Obese (Class I) Increased incidence by 68%  35-39.9 kg/m2 Severe obesity (Class II) Increased incidence by 136%  >40 kg/m2 Extreme obesity (Class III) Increased incidence by 254%   Patient's current BMI Ideal Body weight  Body mass index is 31.57 kg/m. Ideal body weight: 73 kg (160 lb 15 oz) Adjusted ideal body weight: 83.7 kg (184 lb 9 oz)   BMI Readings from Last 4 Encounters:  02/05/18 31.57 kg/m  01/07/18 31.57 kg/m  09/24/17 32.62 kg/m  08/04/13 32.00 kg/m   Wt Readings from Last 4 Encounters:  02/05/18 220 lb (99.8 kg)  01/07/18 220 lb (99.8 kg)  09/24/17 220 lb 14.4 oz (100.2 kg)  08/04/13 223 lb (101.2 kg)  Psych/Mental status: Alert, oriented x 3 (person, place, & time)       Eyes: PERLA Respiratory: No evidence of acute respiratory distress  Cervical Spine Area Exam  Skin & Axial Inspection: No masses, redness, edema, swelling, or associated skin lesions Alignment: Symmetrical Functional ROM: Decreased ROM      Stability: No instability detected Muscle Tone/Strength: Functionally intact. No obvious neuro-muscular anomalies detected. Sensory (Neurological): Articular pain pattern Palpation: Complains of area being tender to palpation Positive provocative maneuver for for bilateral occipital neuralgia and cervical facet arthralgia  Upper Extremity  (UE) Exam    Side: Right upper extremity  Side: Left upper extremity  Skin & Extremity Inspection: Skin color, temperature, and hair growth are WNL. No peripheral edema or cyanosis. No masses, redness, swelling, asymmetry, or associated skin lesions. No contractures.  Skin & Extremity Inspection: Skin color, temperature, and hair growth are WNL. No peripheral edema or cyanosis. No masses, redness, swelling, asymmetry, or associated skin lesions. No contractures.  Functional ROM: Unrestricted ROM          Functional ROM: Unrestricted ROM          Muscle Tone/Strength: Functionally intact. No obvious neuro-muscular anomalies detected.  Muscle Tone/Strength: Functionally intact. No obvious neuro-muscular anomalies detected.  Sensory (Neurological): Unimpaired          Sensory (Neurological): Unimpaired          Palpation: No palpable anomalies              Palpation: No palpable anomalies              Provocative Test(s):  Phalen's test: deferred Tinel's test: deferred Apley's scratch test (touch opposite shoulder):  Action 1 (Across chest): deferred Action 2 (Overhead): deferred Action 3 (LB reach): deferred   Provocative Test(s):  Phalen's test: deferred Tinel's test: deferred Apley's scratch test (touch opposite shoulder):  Action 1 (Across chest): deferred Action 2 (Overhead): deferred Action 3 (LB reach): deferred    Thoracic Spine Area Exam  Skin & Axial Inspection: No masses, redness, or swelling Alignment: Symmetrical Functional ROM: Unrestricted ROM Stability: No instability detected Muscle Tone/Strength: Functionally intact. No obvious neuro-muscular anomalies detected. Sensory (Neurological): Unimpaired Muscle strength & Tone: No palpable anomalies  Lumbar Spine Area Exam  Skin & Axial Inspection: No masses, redness, or swelling Alignment: Symmetrical Functional ROM: Unrestricted ROM       Stability: No instability detected Muscle Tone/Strength: Functionally intact. No  obvious neuro-muscular anomalies detected. Sensory (Neurological): Unimpaired Palpation: No palpable anomalies       Provocative Tests: Hyperextension/rotation test: deferred today       Lumbar quadrant test (Kemp's test): deferred today       Lateral bending test: deferred today       Patrick's Maneuver: deferred today                   FABER test: deferred today                   S-I anterior distraction/compression test: deferred today         S-I lateral compression test: deferred today         S-I Thigh-thrust test: deferred today         S-I Gaenslen's test: deferred today          Gait & Posture Assessment  Ambulation: Unassisted Gait: Relatively normal for age and body habitus Posture: WNL   Lower Extremity Exam    Side: Right lower extremity  Side: Left lower extremity  Stability: No instability observed          Stability: No instability observed          Skin & Extremity Inspection: Skin color, temperature, and hair growth are WNL. No peripheral edema or cyanosis. No masses, redness, swelling, asymmetry, or associated skin lesions. No contractures.  Skin & Extremity Inspection: Skin color, temperature, and hair growth are WNL. No peripheral edema or cyanosis. No masses, redness, swelling, asymmetry, or associated skin lesions. No contractures.  Functional ROM: Unrestricted ROM                  Functional ROM: Unrestricted ROM                  Muscle Tone/Strength: Functionally intact. No obvious neuro-muscular anomalies detected.  Muscle Tone/Strength: Functionally intact. No obvious neuro-muscular anomalies detected.  Sensory (Neurological): Unimpaired  Sensory (Neurological): Unimpaired  Palpation: No palpable anomalies  Palpation: No palpable anomalies   Assessment  Primary Diagnosis & Pertinent Problem List: The primary encounter diagnosis was Cervical facet joint syndrome (C2,3,4,5). Diagnoses of Arthropathy of cervical facet joint, Cervicalgia, and Chronic pain  syndrome were also pertinent to this visit.  Status Diagnosis  Responding Responding Responding 1. Cervical facet joint syndrome (C2,3,4,5)   2. Arthropathy of cervical facet joint   3. Cervicalgia   4. Chronic pain syndrome      General Recommendations: The pain condition that the patient suffers from is best treated with a multidisciplinary approach that involves an increase in physical activity to prevent de-conditioning and worsening of the pain cycle, as well as psychological counseling (formal and/or informal) to address the co-morbid psychological  affects of pain. Treatment will often involve judicious use of pain medications and interventional procedures to decrease the pain, allowing the patient to participate in the physical activity that will ultimately produce long-lasting pain reductions. The goal of the multidisciplinary approach is to return the patient to a higher level of overall function and to restore their ability to perform activities of daily living.  57 year old male with a chief complaint of neck pain, shoulder pain along with occipital headaches secondary to cervical facet joint syndrome.  Patient cervical MRI shows cervical facet arthropathy most pronounced at C3 and C4 along with C4 and C5.  Patient also has facet arthropathy along C2/C3.  Patient follows up today status post bilateral C3, C4, C5 cervical facet medial branch nerve block performed on 01/07/2018.  Patient endorses complete pain relief for the first 2 days after the procedure with approximately 50 to 60% pain relief for 2 to 3 weeks after the procedure.  He describes a dull throbbing sensation along his left neck.  He states that the sharp pain that radiates to his shoulders has improved since his cervical facet block.  We discussed repeating this procedure and possibly proceeding with radio frequency ablation thereafter depending upon results obtained with second agnostic cervical facet medial branch nerve  block at C3, C4, C5 bilaterally.  Risks and benefits were discussed and patient would like to proceed.  Plan: -Repeat bilateral C3, C4, C5 cervical facet medial branch nerve block #2 with sedation under fluoroscopy.  Plan of Care  Pharmacotherapy (Medications Ordered): No orders of the defined types were placed in this encounter.  Lab-work, procedure(s), and/or referral(s): Orders Placed This Encounter  Procedures  . CERVICAL FACET (MEDIAL BRANCH NERVE BLOCK)     Time Note: Greater than 50% of the 25 minute(s) of face-to-face time spent with Mr. Rickel, was spent in counseling/coordination of care regarding: Mr. Rodkey primary cause of pain, the treatment plan, treatment alternatives, the risks and possible complications of proposed treatment, going over the informed consent, the results, interpretation and significance of  his recent diagnostic interventional treatment(s), realistic expectations and the goals of pain management (increased in functionality).  Provider-requested follow-up: Return for Procedure.  Future Appointments  Date Time Provider [place]  02/16/2018 10:45 AM Gillis Santa, MD [facility] None    Primary Care Physician: Casilda Carls, MD Location: [facility] Note by: Gillis Santa, M.D Date: 02/05/2018; Time: 1:34 PM  Patient Instructions   GENERAL RISKS AND COMPLICATIONS  What are the risk, side effects and possible complications? Generally speaking, most procedures are safe.  However, with any procedure there are risks, side effects, and the possibility of complications.  The risks and complications are dependent upon the sites that are lesioned, or the type of nerve block to be performed.  The closer the procedure is to the spine, the more serious the risks are.  Great care is taken when placing the radio frequency needles, block needles or lesioning probes, but sometimes complications can occur. 1. Infection: Any time  there is an injection through the skin, there is a risk of infection.  This is why sterile conditions are used for these blocks.  There are four possible types of infection. 1. Localized skin infection. 2. Central Nervous System Infection-This can be in the form of Meningitis, which can be deadly. 3. Epidural Infections-This can be in the form of an epidural abscess, which can cause pressure inside of the spine, causing compression of the spinal cord with subsequent paralysis.  This would require an emergency surgery to decompress, and there are no guarantees that the patient would recover from the paralysis. 4. Discitis-This is an infection of the intervertebral discs.  It occurs in about 1% of discography procedures.  It is difficult to treat and it may lead to surgery.        2. Pain: the needles have to go through skin and soft tissues, will cause soreness.       3. Damage to internal structures:  The nerves to be lesioned may be near blood vessels or    other nerves which can be potentially damaged.       4. Bleeding: Bleeding is more common if the patient is taking blood thinners such as  aspirin, Coumadin, Ticiid, Plavix, etc., or if he/she have some genetic predisposition  such as hemophilia. Bleeding into the spinal canal can cause compression of the spinal  cord with subsequent paralysis.  This would require an emergency surgery to  decompress and there are no guarantees that the patient would recover from the  paralysis.       5. Pneumothorax:  Puncturing of a lung is a possibility, every time a needle is introduced in  the area of the chest or upper back.  Pneumothorax refers to free air around the  collapsed lung(s), inside of the thoracic cavity (chest cavity).  Another two possible  complications related to a similar event would include: Hemothorax and Chylothorax.   These are variations of the Pneumothorax, where instead of air around the collapsed  lung(s), you may have blood or chyle,  respectively.       6. Spinal headaches: They may occur with any procedures in the area of the spine.       7. Persistent CSF (Cerebro-Spinal Fluid) leakage: This is a rare problem, but may occur  with prolonged intrathecal or epidural catheters either due to the formation of a fistulous  track or a dural tear.       8. Nerve damage: By working so close to the spinal cord, there is always a possibility of  nerve damage, which could be as serious as a permanent spinal cord injury with  paralysis.       9. Death:  Although rare, severe deadly allergic reactions known as "Anaphylactic  reaction" can occur to any of the medications used.      10. Worsening of the symptoms:  We can always make thing worse.  What are the chances of something like this happening? Chances of any of this occuring are extremely low.  By statistics, you have more of a chance of getting killed in a motor vehicle accident: while driving to the hospital than any of the above occurring .  Nevertheless, you should be aware that they are possibilities.  In general, it is similar to taking a shower.  Everybody knows that you can slip, hit your head and get killed.  Does that mean that you should not shower again?  Nevertheless always keep in mind that statistics do not mean anything if you happen to be on the wrong side of them.  Even if a procedure has a 1 (one) in a 1,000,000 (million) chance of going wrong, it you happen to be that one..Also, keep in mind that by statistics, you have more of a chance of having something go wrong when taking medications.  Who should not have this procedure? If you are on a blood thinning medication (e.g. Coumadin, Plavix, see list  of "Blood Thinners"), or if you have an active infection going on, you should not have the procedure.  If you are taking any blood thinners, please inform your physician.  How should I prepare for this procedure?  Do not eat or drink anything at least six hours prior to the  procedure.  Bring a driver with you .  It cannot be a taxi.  Come accompanied by an adult that can drive you back, and that is strong enough to help you if your legs get weak or numb from the local anesthetic.  Take all of your medicines the morning of the procedure with just enough water to swallow them.  If you have diabetes, make sure that you are scheduled to have your procedure done first thing in the morning, whenever possible.  If you have diabetes, take only half of your insulin dose and notify our nurse that you have done so as soon as you arrive at the clinic.  If you are diabetic, but only take blood sugar pills (oral hypoglycemic), then do not take them on the morning of your procedure.  You may take them after you have had the procedure.  Do not take aspirin or any aspirin-containing medications, at least eleven (11) days prior to the procedure.  They may prolong bleeding.  Wear loose fitting clothing that may be easy to take off and that you would not mind if it got stained with Betadine or blood.  Do not wear any jewelry or perfume  Remove any nail coloring.  It will interfere with some of our monitoring equipment.  NOTE: Remember that this is not meant to be interpreted as a complete list of all possible complications.  Unforeseen problems may occur.  BLOOD THINNERS The following drugs contain aspirin or other products, which can cause increased bleeding during surgery and should not be taken for 2 weeks prior to and 1 week after surgery.  If you should need take something for relief of minor pain, you may take acetaminophen which is found in Tylenol,m Datril, Anacin-3 and Panadol. It is not blood thinner. The products listed below are.  Do not take any of the products listed below in addition to any listed on your instruction sheet.  A.P.C or A.P.C with Codeine Codeine Phosphate Capsules #3 Ibuprofen Ridaura  ABC compound Congesprin Imuran rimadil  Advil Cope Indocin  Robaxisal  Alka-Seltzer Effervescent Pain Reliever and Antacid Coricidin or Coricidin-D  Indomethacin Rufen  Alka-Seltzer plus Cold Medicine Cosprin Ketoprofen S-A-C Tablets  Anacin Analgesic Tablets or Capsules Coumadin Korlgesic Salflex  Anacin Extra Strength Analgesic tablets or capsules CP-2 Tablets Lanoril Salicylate  Anaprox Cuprimine Capsules Levenox Salocol  Anexsia-D Dalteparin Magan Salsalate  Anodynos Darvon compound Magnesium Salicylate Sine-off  Ansaid Dasin Capsules Magsal Sodium Salicylate  Anturane Depen Capsules Marnal Soma  APF Arthritis pain formula Dewitt's Pills Measurin Stanback  Argesic Dia-Gesic Meclofenamic Sulfinpyrazone  Arthritis Bayer Timed Release Aspirin Diclofenac Meclomen Sulindac  Arthritis pain formula Anacin Dicumarol Medipren Supac  Analgesic (Safety coated) Arthralgen Diffunasal Mefanamic Suprofen  Arthritis Strength Bufferin Dihydrocodeine Mepro Compound Suprol  Arthropan liquid Dopirydamole Methcarbomol with Aspirin Synalgos  ASA tablets/Enseals Disalcid Micrainin Tagament  Ascriptin Doan's Midol Talwin  Ascriptin A/D Dolene Mobidin Tanderil  Ascriptin Extra Strength Dolobid Moblgesic Ticlid  Ascriptin with Codeine Doloprin or Doloprin with Codeine Momentum Tolectin  Asperbuf Duoprin Mono-gesic Trendar  Aspergum Duradyne Motrin or Motrin IB Triminicin  Aspirin plain, buffered or enteric coated Durasal Myochrisine Trigesic  Aspirin Suppositories Easprin  Nalfon Trillsate  Aspirin with Codeine Ecotrin Regular or Extra Strength Naprosyn Uracel  Atromid-S Efficin Naproxen Ursinus  Auranofin Capsules Elmiron Neocylate Vanquish  Axotal Emagrin Norgesic Verin  Azathioprine Empirin or Empirin with Codeine Normiflo Vitamin E  Azolid Emprazil Nuprin Voltaren  Bayer Aspirin plain, buffered or children's or timed BC Tablets or powders Encaprin Orgaran Warfarin Sodium  Buff-a-Comp Enoxaparin Orudis Zorpin  Buff-a-Comp with Codeine Equegesic Os-Cal-Gesic     Buffaprin Excedrin plain, buffered or Extra Strength Oxalid   Bufferin Arthritis Strength Feldene Oxphenbutazone   Bufferin plain or Extra Strength Feldene Capsules Oxycodone with Aspirin   Bufferin with Codeine Fenoprofen Fenoprofen Pabalate or Pabalate-SF   Buffets II Flogesic Panagesic   Buffinol plain or Extra Strength Florinal or Florinal with Codeine Panwarfarin   Buf-Tabs Flurbiprofen Penicillamine   Butalbital Compound Four-way cold tablets Penicillin   Butazolidin Fragmin Pepto-Bismol   Carbenicillin Geminisyn Percodan   Carna Arthritis Reliever Geopen Persantine   Carprofen Gold's salt Persistin   Chloramphenicol Goody's Phenylbutazone   Chloromycetin Haltrain Piroxlcam   Clmetidine heparin Plaquenil   Cllnoril Hyco-pap Ponstel   Clofibrate Hydroxy chloroquine Propoxyphen         Before stopping any of these medications, be sure to consult the physician who ordered them.  Some, such as Coumadin (Warfarin) are ordered to prevent or treat serious conditions such as "deep thrombosis", "pumonary embolisms", and other heart problems.  The amount of time that you may need off of the medication may also vary with the medication and the reason for which you were taking it.  If you are taking any of these medications, please make sure you notify your pain physician before you undergo any procedures.         Moderate Conscious Sedation, Adult Sedation is the use of medicines to promote relaxation and relieve discomfort and anxiety. Moderate conscious sedation is a type of sedation. Under moderate conscious sedation, you are less alert than normal, but you are still able to respond to instructions, touch, or both. Moderate conscious sedation is used during short medical and dental procedures. It is milder than deep sedation, which is a type of sedation under which you cannot be easily woken up. It is also milder than general anesthesia, which is the use of medicines to make you  unconscious. Moderate conscious sedation allows you to return to your regular activities sooner. Tell a health care provider about:  Any allergies you have.  All medicines you are taking, including vitamins, herbs, eye drops, creams, and over-the-counter medicines.  Use of steroids (by mouth or creams).  Any problems you or family members have had with sedatives and anesthetic medicines.  Any blood disorders you have.  Any surgeries you have had.  Any medical conditions you have, such as sleep apnea.  Whether you are pregnant or may be pregnant.  Any use of cigarettes, alcohol, marijuana, or street drugs. What are the risks? Generally, this is a safe procedure. However, problems may occur, including:  Getting too much medicine (oversedation).  Nausea.  Allergic reaction to medicines.  Trouble breathing. If this happens, a breathing tube may be used to help with breathing. It will be removed when you are awake and breathing on your own.  Heart trouble.  Lung trouble.  What happens before the procedure? Staying hydrated Follow instructions from your health care provider about hydration, which may include:  Up to 2 hours before the procedure - you may continue to drink clear liquids, such as water, clear  fruit juice, black coffee, and plain tea.  Eating and drinking restrictions Follow instructions from your health care provider about eating and drinking, which may include:  8 hours before the procedure - stop eating heavy meals or foods such as meat, fried foods, or fatty foods.  6 hours before the procedure - stop eating light meals or foods, such as toast or cereal.  6 hours before the procedure - stop drinking milk or drinks that contain milk.  2 hours before the procedure - stop drinking clear liquids.  Medicine  Ask your health care provider about:  Changing or stopping your regular medicines. This is especially important if you are taking diabetes medicines  or blood thinners.  Taking medicines such as aspirin and ibuprofen. These medicines can thin your blood. Do not take these medicines before your procedure if your health care provider instructs you not to.  Tests and exams  You will have a physical exam.  You may have blood tests done to show: ? How well your kidneys and liver are working. ? How well your blood can clot. General instructions  Plan to have someone take you home from the hospital or clinic.  If you will be going home right after the procedure, plan to have someone with you for 24 hours. What happens during the procedure?  An IV tube will be inserted into one of your veins.  Medicine to help you relax (sedative) will be given through the IV tube.  The medical or dental procedure will be performed. What happens after the procedure?  Your blood pressure, heart rate, breathing rate, and blood oxygen level will be monitored often until the medicines you were given have worn off.  Do not drive for 24 hours. This information is not intended to replace advice given to you by your health care provider. Make sure you discuss any questions you have with your health care provider. Document Released: 12/25/2000 Document Revised: 09/05/2015 Document Reviewed: 07/22/2015 Elsevier Interactive Patient Education  2018 [place] [facility] Patient Information  Description: The facets are joints in the spine between the vertebrae.  Like any joints in the body, facets can become irritated and painful.  Arthritis can also effect the facets.  By injecting steroids and local anesthetic in and around these joints, we can temporarily block the nerve supply to them.  Steroids act directly on irritated nerves and tissues to reduce selling and inflammation which often leads to decreased pain.  Facet blocks may be done anywhere along the spine from the neck to the low back depending upon the location of your pain.   After numbing the skin  with local anesthetic (like Novocaine), a small needle is passed onto the facet joints under x-ray guidance.  You may experience a sensation of pressure while this is being done.  The entire block usually lasts about 15-25 minutes.   Conditions which may be treated by facet blocks:   Low back/buttock pain  Neck/shoulder pain  Certain types of headaches  Preparation for the injection:  1. Do not eat any solid food or dairy products within 8 hours of your appointment. 2. You may drink clear liquid up to 3 hours before appointment.  Clear liquids include water, black coffee, juice or soda.  No milk or cream please. 3. You may take your regular medication, including pain medications, with a sip of water before your appointment.  Diabetics should hold regular insulin (if taken separately) and take 1/2 normal NPH dose the  morning of the procedure.  Carry some sugar containing items with you to your appointment. 4. A driver must accompany you and be prepared to drive you home after your procedure. 5. Bring all your current medications with you. 6. An IV may be inserted and sedation may be given at the discretion of the physician. 7. A blood pressure cuff, EKG and other monitors will often be applied during the procedure.  Some patients may need to have extra oxygen administered for a short period. 8. You will be asked to provide medical information, including your allergies and medications, prior to the procedure.  We must know immediately if you are taking blood thinners (like Coumadin/Warfarin) or if you are allergic to IV iodine contrast (dye).  We must know if you could possible be pregnant.  Possible side-effects:   Bleeding from needle site  Infection (rare, may require surgery)  Nerve injury (rare)  Numbness & tingling (temporary)  Difficulty urinating (rare, temporary)  Spinal headache (a headache worse with upright posture)  Light-headedness (temporary)  Pain at injection  site (serveral days)  Decreased blood pressure (rare, temporary)  Weakness in arm/leg (temporary)  Pressure sensation in back/neck (temporary)   Call if you experience:   Fever/chills associated with headache or increased back/neck pain  Headache worsened by an upright position  New onset, weakness or numbness of an extremity below the injection site  Hives or difficulty breathing (go to the emergency room)  Inflammation or drainage at the injection site(s)  Severe back/neck pain greater than usual  New symptoms which are concerning to you  Please note:  Although the local anesthetic injected can often make your back or neck feel good for several hours after the injection, the pain will likely return. It takes 3-7 days for steroids to work.  You may not notice any pain relief for at least one week.  If effective, we will often do a series of 2-3 injections spaced 3-6 weeks apart to maximally decrease your pain.  After the initial series, you may be a candidate for a more permanent nerve block of the facets.  If you have any questions, please call #[phone]) [facility]

## 2018-02-05 NOTE — Progress Notes (Signed)
Safety precautions to be maintained throughout the outpatient stay will include: orient to surroundings, keep bed in low position, maintain call bell within reach at all times, provide assistance with transfer out of bed and ambulation.  

## 2018-02-16 ENCOUNTER — Ambulatory Visit
Admission: RE | Admit: 2018-02-16 | Discharge: 2018-02-16 | Disposition: A | Discharge status: Home / Self Care | Payer: BLUE CROSS/BLUE SHIELD | Source: Ambulatory Visit | Attending: Student in an Organized Health Care Education/Training Program | Admitting: Student in an Organized Health Care Education/Training Program

## 2018-02-16 ENCOUNTER — Ambulatory Visit (HOSPITAL_BASED_OUTPATIENT_CLINIC_OR_DEPARTMENT_OTHER): Payer: BLUE CROSS/BLUE SHIELD | Admitting: Student in an Organized Health Care Education/Training Program

## 2018-02-16 ENCOUNTER — Other Ambulatory Visit: Payer: Self-pay

## 2018-02-16 ENCOUNTER — Encounter: Payer: Self-pay | Admitting: Student in an Organized Health Care Education/Training Program

## 2018-02-16 VITALS — BP 124/79 | HR 76 | Temp 98.0°F | Resp 16 | Ht 70.0 in | Wt 220.0 lb

## 2018-02-16 DIAGNOSIS — M25511 Pain in right shoulder: Secondary | ICD-10-CM | POA: Diagnosis present

## 2018-02-16 DIAGNOSIS — M47812 Spondylosis without myelopathy or radiculopathy, cervical region: Secondary | ICD-10-CM

## 2018-02-16 DIAGNOSIS — M542 Cervicalgia: Secondary | ICD-10-CM | POA: Diagnosis present

## 2018-02-16 DIAGNOSIS — Z885 Allergy status to narcotic agent status: Secondary | ICD-10-CM | POA: Diagnosis not present

## 2018-02-16 DIAGNOSIS — Z9049 Acquired absence of other specified parts of digestive tract: Secondary | ICD-10-CM | POA: Insufficient documentation

## 2018-02-16 MED ORDER — LIDOCAINE HCL 2 % IJ SOLN
20.0000 mL | Freq: Once | INTRAMUSCULAR | Status: AC
  Administered 2018-02-16: 400 mg

## 2018-02-16 MED ORDER — DEXAMETHASONE SODIUM PHOSPHATE 10 MG/ML IJ SOLN
10.0000 mg | Freq: Once | INTRAMUSCULAR | Status: AC
  Administered 2018-02-16: 10 mg

## 2018-02-16 MED ORDER — LACTATED RINGERS IV SOLN: 1000.0000 mL | Freq: Once | INTRAVENOUS | Status: DC

## 2018-02-16 MED ORDER — FENTANYL CITRATE (PF) 100 MCG/2ML IJ SOLN
INTRAMUSCULAR | Status: AC
  Filled 2018-02-16: qty 2

## 2018-02-16 MED ORDER — FENTANYL CITRATE (PF) 100 MCG/2ML IJ SOLN
25.0000 ug | INTRAMUSCULAR | Status: DC | PRN
  Administered 2018-02-16: 75 ug via INTRAVENOUS

## 2018-02-16 MED ORDER — ROPIVACAINE HCL 2 MG/ML IJ SOLN
10.0000 mL | Freq: Once | INTRAMUSCULAR | Status: AC
  Administered 2018-02-16: 10 mL

## 2018-02-16 MED ORDER — DEXAMETHASONE SODIUM PHOSPHATE 10 MG/ML IJ SOLN
INTRAMUSCULAR | Status: AC
  Filled 2018-02-16: qty 2

## 2018-02-16 MED ORDER — LIDOCAINE HCL 2 % IJ SOLN
INTRAMUSCULAR | Status: AC
Start: 2018-02-16 — End: ?
  Filled 2018-02-16: qty 20

## 2018-02-16 MED ORDER — ROPIVACAINE HCL 2 MG/ML IJ SOLN
INTRAMUSCULAR | Status: AC
  Filled 2018-02-16: qty 10

## 2018-02-16 NOTE — Patient Instructions (Signed)

## 2018-02-16 NOTE — Progress Notes (Signed)
Safety precautions to be maintained throughout the outpatient stay will include: orient to surroundings, keep bed in low position, maintain call bell within reach at all times, provide assistance with transfer out of bed and ambulation.  

## 2018-02-16 NOTE — Progress Notes (Signed)
Patient's Name: Tony Benson  MRN: [id number]  Referring Provider: Casilda Carls, MD  DOB: July 19, 1960  PCP: Casilda Carls, MD  DOS: 02/16/2018  Note by: Gillis Santa, MD  Service setting: Ambulatory outpatient  Specialty: Interventional Pain Management  Patient type: Established  Location: [facility] (AMB) Pain Management Facility  Visit type: Interventional Procedure   Primary Reason for Visit: Interventional Pain Management Treatment. CC: Shoulder Pain (right) and Neck Pain  Procedure:          Anesthesia, Analgesia, Anxiolysis:  Type: Cervical Facet Medial Branch Block(s)  #2  Primary Purpose: Diagnostic Region: Posterolateral cervical spine Level: C3, C4, C5,Medial Branch Level(s). Injecting these levels blocks the C3-4, C4-5, cervical facet joints. Laterality: Bilateral  Type: Moderate (Conscious) Sedation combined with Local Anesthesia Indication(s): Analgesia and Anxiety Route: Intravenous (IV) IV Access: Secured Sedation: Meaningful verbal contact was maintained at all times during the procedure  Local Anesthetic: Lidocaine 1-2%   Position: Prone with head of the table raised to facilitate breathing.   Indications: 1. Cervical facet joint syndrome (C2,3,4,5)   2. Arthropathy of cervical facet joint    Pain Score: Pre-procedure: 7 /10 Post-procedure: 0-No pain/10  Pre-op Assessment:  Tony Benson is a 57 y.o. (year old), male patient, seen today for interventional treatment. He  has a past surgical history that includes Lung surgery and Cholecystectomy. Tony Benson has a current medication list which includes the following prescription(s): amlodipine, celecoxib, gabapentin, glipizide, losartan, metformin, metoprolol tartrate, omega-3 fatty acids, vitamin d (ergocalciferol), and vitamin e, and the following Facility-Administered Medications: fentanyl and lactated ringers. His primarily concern today is the Shoulder Pain (right) and Neck Pain  Initial Vital Signs:  Pulse/HCG Rate:  78ECG Heart Rate: 83 Temp: 98.3 F (36.8 C) Resp: 16 BP: (!) 146/89 SpO2: 99 %  BMI: Estimated body mass index is 31.57 kg/m as calculated from the following:   Height as of this encounter: 5\' 10"  (1.778 m).   Weight as of this encounter: 220 lb (99.8 kg).  Risk Assessment: Allergies: Reviewed. He is allergic to other and morphine and related.  Allergy Precautions: None required Coagulopathies: Reviewed. None identified.  Blood-thinner therapy: None at this time Active Infection(s): Reviewed. None identified. Tony Benson is afebrile  Site Confirmation: Tony Benson was asked to confirm the procedure and laterality before marking the site Procedure checklist: Completed Consent: Before the procedure and under the influence of no sedative(s), amnesic(s), or anxiolytics, the patient was informed of the treatment options, risks and possible complications. To fulfill our ethical and legal obligations, as recommended by the American Medical Association's Code of Ethics, I have informed the patient of my clinical impression; the nature and purpose of the treatment or procedure; the risks, benefits, and possible complications of the intervention; the alternatives, including doing nothing; the risk(s) and benefit(s) of the alternative treatment(s) or procedure(s); and the risk(s) and benefit(s) of doing nothing. The patient was provided information about the general risks and possible complications associated with the procedure. These may include, but are not limited to: failure to achieve desired goals, infection, bleeding, organ or nerve damage, allergic reactions, paralysis, and death. In addition, the patient was informed of those risks and complications associated to Spine-related procedures, such as failure to decrease pain; infection (i.e.: Meningitis, epidural or intraspinal abscess); bleeding (i.e.: epidural hematoma, subarachnoid hemorrhage, or any other type of intraspinal or peri-dural  bleeding); organ or nerve damage (i.e.: Any type of peripheral nerve, nerve root, or spinal cord injury) with subsequent damage to sensory,  motor, and/or autonomic systems, resulting in permanent pain, numbness, and/or weakness of one or several areas of the body; allergic reactions; (i.e.: anaphylactic reaction); and/or death. Furthermore, the patient was informed of those risks and complications associated with the medications. These include, but are not limited to: allergic reactions (i.e.: anaphylactic or anaphylactoid reaction(s)); adrenal axis suppression; blood sugar elevation that in diabetics may result in ketoacidosis or comma; water retention that in patients with history of congestive heart failure may result in shortness of breath, pulmonary edema, and decompensation with resultant heart failure; weight gain; swelling or edema; medication-induced neural toxicity; particulate matter embolism and blood vessel occlusion with resultant organ, and/or nervous system infarction; and/or aseptic necrosis of one or more joints. Finally, the patient was informed that Medicine is not an exact science; therefore, there is also the possibility of unforeseen or unpredictable risks and/or possible complications that may result in a catastrophic outcome. The patient indicated having understood very clearly. We have given the patient no guarantees and we have made no promises. Enough time was given to the patient to ask questions, all of which were answered to the patient's satisfaction. Tony Benson has indicated that he wanted to continue with the procedure. Attestation: I, the ordering provider, attest that I have discussed with the patient the benefits, risks, side-effects, alternatives, likelihood of achieving goals, and potential problems during recovery for the procedure that I have provided informed consent. Date  Time: 02/16/2018 10:49 AM  Pre-Procedure Preparation:  Monitoring: As per clinic protocol.  Respiration, ETCO2, SpO2, BP, heart rate and rhythm monitor placed and checked for adequate function Safety Precautions: Patient was assessed for positional comfort and pressure points before starting the procedure. Time-out: I initiated and conducted the "Time-out" before starting the procedure, as per protocol. The patient was asked to participate by confirming the accuracy of the "Time Out" information. Verification of the correct person, site, and procedure were performed and confirmed by me, the nursing staff, and the patient. "Time-out" conducted as per Joint Commission's Universal Protocol (UP.01.01.01). Time: 1130  Description of Procedure:          Laterality: Bilateral. The procedure was performed in identical fashion on both sides. Level: C3, C4, C5, Medial Branch Level(s). Area Prepped: Posterior Cervico-thoracic Region Prepping solution: ChloraPrep (2% chlorhexidine gluconate and 70% isopropyl alcohol) Safety Precautions: Aspiration looking for blood return was conducted prior to all injections. At no point did we inject any substances, as a needle was being advanced. Before injecting, the patient was told to immediately notify me if he was experiencing any new onset of "ringing in the ears, or metallic taste in the mouth". No attempts were made at seeking any paresthesias. Safe injection practices and needle disposal techniques used. Medications properly checked for expiration dates. SDV (single dose vial) medications used. After the completion of the procedure, all disposable equipment used was discarded in the proper designated medical waste containers. Local Anesthesia: Protocol guidelines were followed. The patient was positioned over the fluoroscopy table. The area was prepped in the usual manner. The time-out was completed. The target area was identified using fluoroscopy. A 12-in long, straight, sterile hemostat was used with fluoroscopic guidance to locate the targets for each level  blocked. Once located, the skin was marked with an approved surgical skin marker. Once all sites were marked, the skin (epidermis, dermis, and hypodermis), as well as deeper tissues (fat, connective tissue and muscle) were infiltrated with a small amount of a short-acting local anesthetic, loaded on a 10cc  syringe with a 25G, 1.5-in  Needle. An appropriate amount of time was allowed for local anesthetics to take effect before proceeding to the next step. Local Anesthetic: Lidocaine 2.0% The unused portion of the local anesthetic was discarded in the proper designated containers. Technical explanation of process:   C3 Medial Branch Nerve Block (MBB): The target area for the C3 dorsal medial articular branch is the lateral concave waist of the articular pillar of C3. Under fluoroscopic guidance, a Quincke needle was inserted until contact was made with os over the postero-lateral aspect of the articular pillar of C3 (target area). After negative aspiration for blood, 1 mL of the nerve block solution was injected without difficulty or complication. The needle was removed intact. C4 Medial Branch Nerve Block (MBB): The target area for the C4 dorsal medial articular branch is the lateral concave waist of the articular pillar of C4. Under fluoroscopic guidance, a Quincke needle was inserted until contact was made with os over the postero-lateral aspect of the articular pillar of C4 (target area). After negative aspiration for blood, 15mL of the nerve block solution was injected without difficulty or complication. The needle was removed intact. C5 Medial Branch Nerve Block (MBB): The target area for the C5 dorsal medial articular branch is the lateral concave waist of the articular pillar of C5. Under fluoroscopic guidance, a Quincke needle was inserted until contact was made with os over the postero-lateral aspect of the articular pillar of C5 (target area). After negative aspiration for blood, 89mL of the nerve block  solution was injected without difficulty or complication. The needle was removed intact.   Nerve block solution: 10 cc solution made of 9 cc of 0.2% ropivacaine, 1 cc of Decadron 10 mg/cc.  1 to 1.5 cc injected at each level above bilaterally.  The unused portion of the solution was discarded in the proper designated containers.  Once the entire procedure was completed, the treated area was cleaned, making sure to leave some of the prepping solution back to take advantage of its long term bactericidal properties.  Vitals:   02/16/18 1153 02/16/18 1203 02/16/18 1212 02/16/18 1222  BP: (!) 143/91 131/74 (!) 157/82 124/79  Pulse: 76     Resp: 20 16 16 16   Temp:  98 F (36.7 C)    TempSrc:      SpO2: 95% 99% 98% 98%  Weight:      Height:        Start Time: 1130 hrs. End Time: 1151 hrs.  Imaging Guidance (Spinal):          Type of Imaging Technique: Fluoroscopy Guidance (Spinal) Indication(s): Assistance in needle guidance and placement for procedures requiring needle placement in or near specific anatomical locations not easily accessible without such assistance. Exposure Time: Please see nurses notes. Contrast: None used. Fluoroscopic Guidance: I was personally present during the use of fluoroscopy. "Tunnel Vision Technique" used to obtain the best possible view of the target area. Parallax error corrected before commencing the procedure. "Direction-depth-direction" technique used to introduce the needle under continuous pulsed fluoroscopy. Once target was reached, antero-posterior, oblique, and lateral fluoroscopic projection used confirm needle placement in all planes. Images permanently stored in EMR. Interpretation: No contrast injected. I personally interpreted the imaging intraoperatively. Adequate needle placement confirmed in multiple planes. Permanent images saved into the patient's record.  Antibiotic Prophylaxis:   Anti-infectives (From admission, onward)   None      Indication(s): None identified  Post-operative Assessment:  Post-procedure Vital Signs:  Pulse/HCG Rate: 7680 Temp: 98 F (36.7 C) Resp: 16 BP: 124/79 SpO2: 98 %  EBL: None  Complications: No immediate post-treatment complications observed by team, or reported by patient.  Note: The patient tolerated the entire procedure well. A repeat set of vitals were taken after the procedure and the patient was kept under observation following institutional policy, for this type of procedure. Post-procedural neurological assessment was performed, showing return to baseline, prior to discharge. The patient was provided with post-procedure discharge instructions, including a section on how to identify potential problems. Should any problems arise concerning this procedure, the patient was given instructions to immediately contact us, at any time, without hesitation. In any case, we plan to contact the patient by telephone for a follow-up status report regarding this interventional procedure.  Comments:  No additional relevant information.  Plan of Care    Imaging Orders     DG C-Arm 1-60 Min-No Report Procedure Orders    No procedure(s) ordered today    Medications ordered for procedure: Meds ordered this encounter  Medications  . lactated ringers infusion 1,000 mL  . fentaNYL (SUBLIMAZE) injection 25-100 mcg    Make sure Narcan is available in the pyxis when using this medication. In the event of respiratory depression (RR< 8/min): Titrate NARCAN (naloxone) in increments of 0.1 to 0.2 mg IV at 2-3 minute intervals, until desired degree of reversal.  . ropivacaine (PF) 2 mg/mL (0.2%) (NAROPIN) injection 10 mL  . lidocaine (XYLOCAINE) 2 % (with pres) injection 400 mg  . dexamethasone (DECADRON) injection 10 mg  . dexamethasone (DECADRON) injection 10 mg   Medications administered: We administered fentaNYL, ropivacaine (PF) 2 mg/mL (0.2%), lidocaine, dexamethasone, and dexamethasone.   See the medical record for exact dosing, route, and time of administration.  New Prescriptions   No medications on file   Disposition: Discharge home  Discharge Date & Time: 02/16/2018; 1223 hrs.   Physician-requested Follow-up: Return in about 4 weeks (around 03/16/2018) for Post Procedure Evaluation.  Future Appointments  Date Time Provider [place]  03/16/2018  2:00 PM Gillis Santa, MD [facility] None   Primary Care Physician: Casilda Carls, MD Location: [facility] Note by: Gillis Santa, MD Date: 02/16/2018; Time: 12:49 PM  Disclaimer:  Medicine is not an exact science. The only guarantee in medicine is that nothing is guaranteed. It is important to note that the decision to proceed with this intervention was based on the information collected from the patient. The Data and conclusions were drawn from the patient's questionnaire, the interview, and the physical examination. Because the information was provided in large part by the patient, it cannot be guaranteed that it has not been purposely or unconsciously manipulated. Every effort has been made to obtain as much relevant data as possible for this evaluation. It is important to note that the conclusions that lead to this procedure are derived in large part from the available data. Always take into account that the treatment will also be dependent on availability of resources and existing treatment guidelines, considered by other Pain Management Practitioners as being common knowledge and practice, at the time of the intervention. For Medico-Legal purposes, it is also important to point out that variation in procedural techniques and pharmacological choices are the acceptable norm. The indications, contraindications, technique, and results of the above procedure should only be interpreted and judged by a Board-Certified Interventional Pain Specialist with extensive familiarity and expertise in the same  exact procedure and technique.

## 2018-02-17 ENCOUNTER — Telehealth: Payer: Self-pay

## 2018-02-17 NOTE — Telephone Encounter (Signed)
Denies any needs at this time. "I am pain free". Instructed to call if needed.

## 2018-02-23 ENCOUNTER — Telehealth: Payer: Self-pay | Admitting: Student in an Organized Health Care Education/Training Program

## 2018-02-23 NOTE — Telephone Encounter (Signed)
Patient took 2 days off work after his procedure and would like to get a work note for those 2 days.

## 2018-02-23 NOTE — Telephone Encounter (Signed)
Advised per voicemail that he can pick up work note.

## 2018-03-16 ENCOUNTER — Encounter: Payer: Self-pay | Admitting: Student in an Organized Health Care Education/Training Program

## 2018-03-16 ENCOUNTER — Ambulatory Visit
Payer: BLUE CROSS/BLUE SHIELD | Attending: Student in an Organized Health Care Education/Training Program | Admitting: Student in an Organized Health Care Education/Training Program

## 2018-03-16 VITALS — BP 164/97 | HR 85 | Temp 98.2°F | Resp 16 | Ht 70.0 in | Wt 220.0 lb

## 2018-03-16 DIAGNOSIS — Z9049 Acquired absence of other specified parts of digestive tract: Secondary | ICD-10-CM | POA: Diagnosis not present

## 2018-03-16 DIAGNOSIS — Z79899 Other long term (current) drug therapy: Secondary | ICD-10-CM | POA: Diagnosis not present

## 2018-03-16 DIAGNOSIS — Z7984 Long term (current) use of oral hypoglycemic drugs: Secondary | ICD-10-CM | POA: Insufficient documentation

## 2018-03-16 DIAGNOSIS — Z885 Allergy status to narcotic agent status: Secondary | ICD-10-CM | POA: Insufficient documentation

## 2018-03-16 DIAGNOSIS — M542 Cervicalgia: Secondary | ICD-10-CM

## 2018-03-16 DIAGNOSIS — Z791 Long term (current) use of non-steroidal anti-inflammatories (NSAID): Secondary | ICD-10-CM | POA: Diagnosis not present

## 2018-03-16 DIAGNOSIS — G894 Chronic pain syndrome: Secondary | ICD-10-CM | POA: Diagnosis not present

## 2018-03-16 DIAGNOSIS — M47812 Spondylosis without myelopathy or radiculopathy, cervical region: Secondary | ICD-10-CM

## 2018-03-16 DIAGNOSIS — I10 Essential (primary) hypertension: Secondary | ICD-10-CM | POA: Insufficient documentation

## 2018-03-16 MED ORDER — KETOROLAC TROMETHAMINE 30 MG/ML IJ SOLN
30.0000 mg | Freq: Once | INTRAMUSCULAR | Status: AC
  Administered 2018-03-16: 30 mg via INTRAMUSCULAR
  Filled 2018-03-16: qty 1

## 2018-03-16 MED ORDER — ORPHENADRINE CITRATE 30 MG/ML IJ SOLN
30.0000 mg | Freq: Once | INTRAMUSCULAR | Status: AC
  Administered 2018-03-16: 30 mg via INTRAMUSCULAR
  Filled 2018-03-16: qty 2

## 2018-03-16 NOTE — Progress Notes (Signed)
Safety precautions to be maintained throughout the outpatient stay will include: orient to surroundings, keep bed in low position, maintain call bell within reach at all times, provide assistance with transfer out of bed and ambulation.  

## 2018-03-16 NOTE — Patient Instructions (Signed)
Radiofrequency Lesioning Radiofrequency lesioning is a procedure that is performed to relieve pain. The procedure is often used for back, neck, or arm pain. Radiofrequency lesioning involves the use of a machine that creates radio waves to make heat. During the procedure, the heat is applied to the nerve that carries the pain signal. The heat damages the nerve and interferes with the pain signal. Pain relief usually starts about 2 weeks after the procedure and lasts for 6 months to 1 year. Tell a health care provider about:  Any allergies you have.  All medicines you are taking, including vitamins, herbs, eye drops, creams, and over-the-counter medicines.  Any problems you or family members have had with anesthetic medicines.  Any blood disorders you have.  Any surgeries you have had.  Any medical conditions you have.  Whether you are pregnant or may be pregnant. What are the risks? Generally, this is a safe procedure. However, problems may occur, including:  Pain or soreness at the injection site.  Infection at the injection site.  Damage to nerves or blood vessels.  What happens before the procedure?  Ask your health care provider about: ? Changing or stopping your regular medicines. This is especially important if you are taking diabetes medicines or blood thinners. ? Taking medicines such as aspirin and ibuprofen. These medicines can thin your blood. Do not take these medicines before your procedure if your health care provider instructs you not to.  Follow instructions from your health care provider about eating or drinking restrictions.  Plan to have someone take you home after the procedure.  If you go home right after the procedure, plan to have someone with you for 24 hours. What happens during the procedure?  You will be given one or more of the following: ? A medicine to help you relax (sedative). ? A medicine to numb the area (local anesthetic).  You will be  awake during the procedure. You will need to be able to talk with the health care provider during the procedure.  With the help of a type of X-ray (fluoroscopy), the health care provider will insert a radiofrequency needle into the area to be treated.  Next, a wire that carries the radio waves (electrode) will be put through the radiofrequency needle. An electrical pulse will be sent through the electrode to verify the correct nerve. You will feel a tingling sensation, and you may have muscle twitching.  Then, the tissue that is around the needle tip will be heated by an electric current that is passed using the radiofrequency machine. This will numb the nerves.  A bandage (dressing) will be put on the insertion area after the procedure is done. The procedure may vary among health care providers and hospitals. What happens after the procedure?  Your blood pressure, heart rate, breathing rate, and blood oxygen level will be monitored often until the medicines you were given have worn off.  Return to your normal activities as directed by your health care provider. This information is not intended to replace advice given to you by your health care provider. Make sure you discuss any questions you have with your health care provider. Document Released: 11/28/2010 Document Revised: 09/07/2015 Document Reviewed: 05/09/2014 Elsevier Interactive Patient Education  2018 [employer].  ____________________________________________________________________________________________  Preparing for Procedure with Sedation  Instructions: . Oral Intake: Do not eat or drink anything for at least 8 hours prior to your procedure. . Transportation: Public transportation is not allowed. Bring an adult driver. The  driver must be physically present in our waiting room before any procedure can be started. Marland Kitchen Physical Assistance: Bring an adult physically capable of assisting you, in the event you need help. This  adult should keep you company at home for at least 6 hours after the procedure. . Blood Pressure Medicine: Take your blood pressure medicine with a sip of water the morning of the procedure. . Blood thinners: Notify our staff if you are taking any blood thinners. Depending on which one you take, there will be specific instructions on how and when to stop it. . Diabetics on insulin: Notify the staff so that you can be scheduled 1st case in the morning. If your diabetes requires high dose insulin, take only  of your normal insulin dose the morning of the procedure and notify the staff that you have done so. . Preventing infections: Shower with an antibacterial soap the morning of your procedure. . Build-up your immune system: Take 1000 mg of Vitamin C with every meal (3 times a day) the day prior to your procedure. Marland Kitchen Antibiotics: Inform the staff if you have a condition or reason that requires you to take antibiotics before dental procedures. . Pregnancy: If you are pregnant, call and cancel the procedure. . Sickness: If you have a cold, fever, or any active infections, call and cancel the procedure. . Arrival: You must be in the facility at least 30 minutes prior to your scheduled procedure. . Children: Do not bring children with you. . Dress appropriately: Bring dark clothing that you would not mind if they get stained. . Valuables: Do not bring any jewelry or valuables.  Procedure appointments are reserved for interventional treatments only. Marland Kitchen No Prescription Refills. . No medication changes will be discussed during procedure appointments. . No disability issues will be discussed.  Reasons to call and reschedule or cancel your procedure: (Following these recommendations will minimize the risk of a serious complication.) . Surgeries: Avoid having procedures within 2 weeks of any surgery. (Avoid for 2 weeks before or after any surgery). . Flu Shots: Avoid having procedures within 2 weeks of a  flu shots or . (Avoid for 2 weeks before or after immunizations). . Barium: Avoid having a procedure within 7-10 days after having had a radiological study involving the use of radiological contrast. (Myelograms, Barium swallow or enema study). . Heart attacks: Avoid any elective procedures or surgeries for the initial 6 months after a "Myocardial Infarction" (Heart Attack). . Blood thinners: It is imperative that you stop these medications before procedures. Let us know if you if you take any blood thinner.  . Infection: Avoid procedures during or within two weeks of an infection (including chest colds or gastrointestinal problems). Symptoms associated with infections include: Localized redness, fever, chills, night sweats or profuse sweating, burning sensation when voiding, cough, congestion, stuffiness, runny nose, sore throat, diarrhea, nausea, vomiting, cold or Flu symptoms, recent or current infections. It is specially important if the infection is over the area that we intend to treat. Marland Kitchen Heart and lung problems: Symptoms that may suggest an active cardiopulmonary problem include: cough, chest pain, breathing difficulties or shortness of breath, dizziness, ankle swelling, uncontrolled high or unusually low blood pressure, and/or palpitations. If you are experiencing any of these symptoms, cancel your procedure and contact your primary care physician for an evaluation.  Remember:  Regular Business hours are:  Monday to Thursday 8:00 AM to 4:00 PM  Provider's Schedule: Milinda Pointer, MD:  Procedure days: Tuesday and  Thursday 7:30 AM to 4:00 PM  Gillis Santa, MD:  Procedure days: Monday and Wednesday 7:30 AM to 4:00 PM ____________________________________________________________________________________________  ____________________________________________________________________________________________  Preparing for Procedure with Sedation  Instructions: . Oral Intake: Do not eat or  drink anything for at least 8 hours prior to your procedure. . Transportation: Public transportation is not allowed. Bring an adult driver. The driver must be physically present in our waiting room before any procedure can be started. Marland Kitchen Physical Assistance: Bring an adult physically capable of assisting you, in the event you need help. This adult should keep you company at home for at least 6 hours after the procedure. . Blood Pressure Medicine: Take your blood pressure medicine with a sip of water the morning of the procedure. . Blood thinners: Notify our staff if you are taking any blood thinners. Depending on which one you take, there will be specific instructions on how and when to stop it. . Diabetics on insulin: Notify the staff so that you can be scheduled 1st case in the morning. If your diabetes requires high dose insulin, take only  of your normal insulin dose the morning of the procedure and notify the staff that you have done so. . Preventing infections: Shower with an antibacterial soap the morning of your procedure. . Build-up your immune system: Take 1000 mg of Vitamin C with every meal (3 times a day) the day prior to your procedure. Marland Kitchen Antibiotics: Inform the staff if you have a condition or reason that requires you to take antibiotics before dental procedures. . Pregnancy: If you are pregnant, call and cancel the procedure. . Sickness: If you have a cold, fever, or any active infections, call and cancel the procedure. . Arrival: You must be in the facility at least 30 minutes prior to your scheduled procedure. . Children: Do not bring children with you. . Dress appropriately: Bring dark clothing that you would not mind if they get stained. . Valuables: Do not bring any jewelry or valuables.  Procedure appointments are reserved for interventional treatments only. Marland Kitchen No Prescription Refills. . No medication changes will be discussed during procedure appointments. . No disability  issues will be discussed.  Reasons to call and reschedule or cancel your procedure: (Following these recommendations will minimize the risk of a serious complication.) . Surgeries: Avoid having procedures within 2 weeks of any surgery. (Avoid for 2 weeks before or after any surgery). . Flu Shots: Avoid having procedures within 2 weeks of a flu shots or . (Avoid for 2 weeks before or after immunizations). . Barium: Avoid having a procedure within 7-10 days after having had a radiological study involving the use of radiological contrast. (Myelograms, Barium swallow or enema study). . Heart attacks: Avoid any elective procedures or surgeries for the initial 6 months after a "Myocardial Infarction" (Heart Attack). . Blood thinners: It is imperative that you stop these medications before procedures. Let us know if you if you take any blood thinner.  . Infection: Avoid procedures during or within two weeks of an infection (including chest colds or gastrointestinal problems). Symptoms associated with infections include: Localized redness, fever, chills, night sweats or profuse sweating, burning sensation when voiding, cough, congestion, stuffiness, runny nose, sore throat, diarrhea, nausea, vomiting, cold or Flu symptoms, recent or current infections. It is specially important if the infection is over the area that we intend to treat. Marland Kitchen Heart and lung problems: Symptoms that may suggest an active cardiopulmonary problem include: cough, chest pain, breathing difficulties or  shortness of breath, dizziness, ankle swelling, uncontrolled high or unusually low blood pressure, and/or palpitations. If you are experiencing any of these symptoms, cancel your procedure and contact your primary care physician for an evaluation.  Remember:  Regular Business hours are:  Monday to Thursday 8:00 AM to 4:00 PM  Provider's Schedule: Milinda Pointer, MD:  Procedure days: Tuesday and Thursday 7:30 AM to 4:00 PM  Gillis Santa, MD:  Procedure days: Monday and Wednesday 7:30 AM to 4:00 PM ____________________________________________________________________________________________

## 2018-03-16 NOTE — Progress Notes (Signed)
Patient's Name: Tony Benson  MRN: [id number]  Referring Provider: Casilda Carls, MD  DOB: 1960-11-10  PCP: Casilda Carls, MD  DOS: 03/16/2018  Note by: Gillis Santa, MD  Service setting: Ambulatory outpatient  Specialty: Interventional Pain Management  Location: [facility] (AMB) Pain Management Facility    Patient type: Established   Primary Reason(s) for Visit: Encounter for post-procedure evaluation of chronic illness with mild to moderate exacerbation CC: Neck Pain (right )  HPI  Mr. Chason is a 57 y.o. year old, male patient, who comes today for a post-procedure evaluation. He has Asthma, chronic; Cellulitis and abscess of right thigh s/p I&D 09/24/2017; Hypertension; Cervical facet joint syndrome (C2,3,4,5); Arthropathy of cervical facet joint; Cervicalgia; and Chronic pain syndrome on their problem list. His primarily concern today is the Neck Pain (right )  Pain Assessment: Location: Right Neck Radiating: up the neck  Onset: More than a month ago Duration: Chronic pain Quality: Dull Severity: 5 /10 (subjective, self-reported pain score)  Note: Reported level is inconsistent with clinical observations. Clinically the patient looks like a 2/10 A 2/10 is viewed as "Mild to Moderate" and described as noticeable and distracting. Impossible to hide from other people. More frequent flare-ups. Still possible to adapt and function close to normal. It can be very annoying and may have occasional stronger flare-ups. With discipline, patients may get used to it and adapt. Information on the proper use of the pain scale provided to the patient today. When using our objective Pain Scale, levels between 6 and 10/10 are said to belong in an emergency room, as it progressively worsens from a 6/10, described as severely limiting, requiring emergency care not usually available at an outpatient pain management facility. At a 6/10 level, communication becomes difficult and requires great effort. Assistance to reach  the emergency department may be required. Facial flushing and profuse sweating along with potentially dangerous increases in heart rate and blood pressure will be evident. Effect on ADL:   Timing: Intermittent Modifying factors: procedure helped, medications, ice and hot showers.  BP: (!) 164/97  HR: 85  Mr. Goshert comes in today for post-procedure evaluation.  Further details on both, my assessment(s), as well as the proposed treatment plan, please see below.  Post-Procedure Assessment  02/23/2018 Procedure: Bilateral C3, C4, C5 cervical medial branch nerve block Pre-procedure pain score:  7/10 Post-procedure pain score: 0/10         Influential Factors: BMI: 31.57 kg/m Intra-procedural challenges: None observed.         Assessment challenges: None detected.              Reported side-effects: None.        Post-procedural adverse reactions or complications: None reported         Sedation: Please see nurses note. When no sedatives are used, the analgesic levels obtained are directly associated to the effectiveness of the local anesthetics. However, when sedation is provided, the level of analgesia obtained during the initial 1 hour following the intervention, is believed to be the result of a combination of factors. These factors may include, but are not limited to: 1. The effectiveness of the local anesthetics used. 2. The effects of the analgesic(s) and/or anxiolytic(s) used. 3. The degree of discomfort experienced by the patient at the time of the procedure. 4. The patients ability and reliability in recalling and recording the events. 5. The presence and influence of possible secondary gains and/or psychosocial factors. Reported result: Relief experienced during the 1st hour  after the procedure: 100 % (Ultra-Short Term Relief)            Interpretative annotation: Clinically appropriate result. Analgesia during this period is likely to be Local Anesthetic and/or IV Sedative  (Analgesic/Anxiolytic) related.          Effects of local anesthetic: The analgesic effects attained during this period are directly associated to the localized infiltration of local anesthetics and therefore cary significant diagnostic value as to the etiological location, or anatomical origin, of the pain. Expected duration of relief is directly dependent on the pharmacodynamics of the local anesthetic used. Long-acting (4-6 hours) anesthetics used.  Reported result: Relief during the next 4 to 6 hour after the procedure: 100 % (Short-Term Relief)            Interpretative annotation: Clinically appropriate result. Analgesia during this period is likely to be Local Anesthetic-related.          Long-term benefit: Defined as the period of time past the expected duration of local anesthetics (1 hour for short-acting and 4-6 hours for long-acting). With the possible exception of prolonged sympathetic blockade from the local anesthetics, benefits during this period are typically attributed to, or associated with, other factors such as analgesic sensory neuropraxia, antiinflammatory effects, or beneficial biochemical changes provided by agents other than the local anesthetics.  Reported result: Extended relief following procedure: 100 % (Long-Term Relief)            Interpretative annotation: Clinically possible results. Good relief. No permanent benefit expected. Inflammation plays a part in the etiology to the pain.          Current benefits: Defined as reported results that persistent at this point in time.   Analgesia: 50-75 %            Function: Somewhat improved ROM: Somewhat improved Interpretative annotation: Recurrence of symptoms. No permanent benefit expected. Effective diagnostic intervention.          Interpretation: Results would suggest a successful diagnostic intervention.                  Plan:  Proceed with Radiofrequency Ablation for the purpose of attaining long-term benefits.                 Laboratory Chemistry  Inflammation Markers (CRP: Acute Phase) (ESR: Chronic Phase) No results found for: CRP, ESRSEDRATE, LATICACIDVEN                       Rheumatology Markers No results found for: RF, ANA, LABURIC, URICUR, LYMEIGGIGMAB, LYMEABIGMQN, HLAB27                      Renal Function Markers Lab Results  Component Value Date   BUN 13 09/24/2017   CREATININE 1.11 09/24/2017   GFRAA >60 09/24/2017   GFRNONAA >60 09/24/2017                             Hepatic Function Markers Lab Results  Component Value Date   AST 18 09/24/2017   ALT 24 09/24/2017   ALBUMIN 3.8 09/24/2017   ALKPHOS 62 09/24/2017                        Electrolytes Lab Results  Component Value Date   NA 141 09/24/2017   K 4.1 09/24/2017   CL 106 09/24/2017   CALCIUM 9.7 09/24/2017  Neuropathy Markers Lab Results  Component Value Date   HGBA1C 6.6 (H) 09/25/2017   HIV Non Reactive 09/24/2017                        CNS Tests No results found for: COLORCSF, APPEARCSF, RBCCOUNTCSF, WBCCSF, POLYSCSF, LYMPHSCSF, EOSCSF, PROTEINCSF, GLUCCSF, JCVIRUS, CSFOLI, IGGCSF                      Bone Pathology Markers No results found for: VD25OH, [id number], [id number], [id number], 25OHVITD1, 25OHVITD2, 25OHVITD3, TESTOFREE, TESTOSTERONE                       Coagulation Parameters Lab Results  Component Value Date   PLT 308 09/26/2017                        Cardiovascular Markers Lab Results  Component Value Date   HGB 13.2 09/26/2017   HCT 39.9 09/26/2017                         CA Markers No results found for: CEA, CA125, LABCA2                      Note: Lab results reviewed.  Recent Diagnostic Imaging Results  DG C-Arm 1-60 Min-No Report Fluoroscopy was utilized by the requesting physician.  No radiographic  interpretation.   Complexity Note: Imaging results reviewed. Results shared with Mr. Beavers, using Layman's terms.                          Meds   Current Outpatient Medications:  .  amLODipine (NORVASC) 10 MG tablet, Take 10 mg by mouth daily., Disp: , Rfl: 1 .  celecoxib (CELEBREX) 200 MG capsule, Take 200 mg by mouth 2 (two) times daily., Disp: , Rfl:  .  gabapentin (NEURONTIN) 300 MG capsule, 1 CAPSULE EVERY 12 HOURS, Disp: , Rfl: 1 .  glipiZIDE (GLUCOTROL) 5 MG tablet, Take 5 mg by mouth 2 (two) times daily before a meal., Disp: , Rfl:  .  losartan (COZAAR) 100 MG tablet, Take 100 mg by mouth daily., Disp: , Rfl: 1 .  metFORMIN (GLUCOPHAGE) 500 MG tablet, Take 1,000 mg by mouth 2 (two) times daily with a meal. , Disp: , Rfl:  .  metoprolol tartrate (LOPRESSOR) 25 MG tablet, Take 25 mg by mouth 2 (two) times daily., Disp: , Rfl:  .  Omega-3 Fatty Acids (FISH OIL PO), Take 1 capsule by mouth daily., Disp: , Rfl:  .  Vitamin D, Ergocalciferol, (DRISDOL) 50000 units CAPS capsule, Take 50,000 Units by mouth every 7 (seven) days., Disp: , Rfl:  .  vitamin E 100 UNIT capsule, Take 100 Units by mouth daily., Disp: , Rfl:   ROS  Constitutional: Denies any fever or chills Gastrointestinal: No reported hemesis, hematochezia, vomiting, or acute GI distress Musculoskeletal: Denies any acute onset joint swelling, redness, loss of ROM, or weakness Neurological: No reported episodes of acute onset apraxia, aphasia, dysarthria, agnosia, amnesia, paralysis, loss of coordination, or loss of consciousness  Allergies  Mr. Grieger is allergic to other and morphine and related.  PFSH  Drug: Mr. Loveday  reports that he does not use drugs. Alcohol:  reports that he does not drink alcohol. Tobacco:  reports that he has never smoked. He has never used smokeless tobacco.  Medical:  has a past medical history of Asthma and Hypertension. Surgical: Mr. Willmann  has a past surgical history that includes Lung surgery and Cholecystectomy. Family: family history includes Allergies in his sister.  Constitutional Exam  General appearance: Well  nourished, well developed, and well hydrated. In no apparent acute distress Vitals:   03/16/18 1414  BP: (!) 164/97  Pulse: 85  Resp: 16  Temp: 98.2 F (36.8 C)  TempSrc: Oral  SpO2: 98%  Weight: 220 lb (99.8 kg)  Height: 5' 10" (1.778 m)   BMI Assessment: Estimated body mass index is 31.57 kg/m as calculated from the following:   Height as of this encounter: 5' 10" (1.778 m).   Weight as of this encounter: 220 lb (99.8 kg).  BMI interpretation table: BMI level Category Range association with higher incidence of chronic pain  <18 kg/m2 Underweight   18.5-24.9 kg/m2 Ideal body weight   25-29.9 kg/m2 Overweight Increased incidence by 20%  30-34.9 kg/m2 Obese (Class I) Increased incidence by 68%  35-39.9 kg/m2 Severe obesity (Class II) Increased incidence by 136%  >40 kg/m2 Extreme obesity (Class III) Increased incidence by 254%   Patient's current BMI Ideal Body weight  Body mass index is 31.57 kg/m. Ideal body weight: 73 kg (160 lb 15 oz) Adjusted ideal body weight: 83.7 kg (184 lb 9 oz)   BMI Readings from Last 4 Encounters:  03/16/18 31.57 kg/m  02/16/18 31.57 kg/m  02/05/18 31.57 kg/m  01/07/18 31.57 kg/m   Wt Readings from Last 4 Encounters:  03/16/18 220 lb (99.8 kg)  02/16/18 220 lb (99.8 kg)  02/05/18 220 lb (99.8 kg)  01/07/18 220 lb (99.8 kg)  Psych/Mental status: Alert, oriented x 3 (person, place, & time)       Eyes: PERLA Respiratory: No evidence of acute respiratory distress  Cervical Spine Area Exam  Skin & Axial Inspection: No masses, redness, edema, swelling, or associated skin lesions Alignment: Symmetrical Functional ROM: Improved after treatment      Stability: No instability detected Muscle Tone/Strength: Functionally intact. No obvious neuro-muscular anomalies detected. Sensory (Neurological): Arthropathic arthralgia Palpation: Complains of area being tender to palpation Positive provocative maneuver for for cervical facet disease  Upper  Extremity (UE) Exam    Side: Right upper extremity  Side: Left upper extremity  Skin & Extremity Inspection: Skin color, temperature, and hair growth are WNL. No peripheral edema or cyanosis. No masses, redness, swelling, asymmetry, or associated skin lesions. No contractures.  Skin & Extremity Inspection: Skin color, temperature, and hair growth are WNL. No peripheral edema or cyanosis. No masses, redness, swelling, asymmetry, or associated skin lesions. No contractures.  Functional ROM: Unrestricted ROM          Functional ROM: Unrestricted ROM          Muscle Tone/Strength: Functionally intact. No obvious neuro-muscular anomalies detected.  Muscle Tone/Strength: Functionally intact. No obvious neuro-muscular anomalies detected.  Sensory (Neurological): Unimpaired          Sensory (Neurological): Unimpaired          Palpation: No palpable anomalies              Palpation: No palpable anomalies              Provocative Test(s):  Phalen's test: deferred Tinel's test: deferred Apley's scratch test (touch opposite shoulder):  Action 1 (Across chest): deferred Action 2 (Overhead): deferred Action 3 (LB reach): deferred   Provocative Test(s):  Phalen's test: deferred Tinel's test: deferred Apley's  scratch test (touch opposite shoulder):  Action 1 (Across chest): deferred Action 2 (Overhead): deferred Action 3 (LB reach): deferred    Thoracic Spine Area Exam  Skin & Axial Inspection: No masses, redness, or swelling Alignment: Symmetrical Functional ROM: Unrestricted ROM Stability: No instability detected Muscle Tone/Strength: Functionally intact. No obvious neuro-muscular anomalies detected. Sensory (Neurological): Unimpaired Muscle strength & Tone: No palpable anomalies  Lumbar Spine Area Exam  Skin & Axial Inspection: No masses, redness, or swelling Alignment: Symmetrical Functional ROM: Unrestricted ROM       Stability: No instability detected Muscle Tone/Strength: Functionally  intact. No obvious neuro-muscular anomalies detected. Sensory (Neurological): Unimpaired Palpation: No palpable anomalies       Provocative Tests: Hyperextension/rotation test: deferred today       Lumbar quadrant test (Kemp's test): deferred today       Lateral bending test: deferred today       Patrick's Maneuver: deferred today                   FABER test: deferred today                   S-I anterior distraction/compression test: deferred today         S-I lateral compression test: deferred today         S-I Thigh-thrust test: deferred today         S-I Gaenslen's test: deferred today          Gait & Posture Assessment  Ambulation: Unassisted Gait: Relatively normal for age and body habitus Posture: WNL   Lower Extremity Exam    Side: Right lower extremity  Side: Left lower extremity  Stability: No instability observed          Stability: No instability observed          Skin & Extremity Inspection: Skin color, temperature, and hair growth are WNL. No peripheral edema or cyanosis. No masses, redness, swelling, asymmetry, or associated skin lesions. No contractures.  Skin & Extremity Inspection: Skin color, temperature, and hair growth are WNL. No peripheral edema or cyanosis. No masses, redness, swelling, asymmetry, or associated skin lesions. No contractures.  Functional ROM: Unrestricted ROM                  Functional ROM: Unrestricted ROM                  Muscle Tone/Strength: Functionally intact. No obvious neuro-muscular anomalies detected.  Muscle Tone/Strength: Functionally intact. No obvious neuro-muscular anomalies detected.  Sensory (Neurological): Unimpaired        Sensory (Neurological): Unimpaired        DTR: Patellar: deferred today Achilles: deferred today Plantar: deferred today  DTR: Patellar: deferred today Achilles: deferred today Plantar: deferred today  Palpation: No palpable anomalies  Palpation: No palpable anomalies   Assessment  Primary Diagnosis &  Pertinent Problem List: The primary encounter diagnosis was Cervical facet joint syndrome (C2,3,4,5). Diagnoses of Arthropathy of cervical facet joint, Cervicalgia, and Chronic pain syndrome were also pertinent to this visit.  Status Diagnosis  Responding Responding Responding 1. Cervical facet joint syndrome (C2,3,4,5)   2. Arthropathy of cervical facet joint   3. Cervicalgia   4. Chronic pain syndrome      Very pleasant 57 year old male who follows up status post bilateral C3, C4, C5 cervical medial branch nerve block #2.  Patient states that he had 100% pain relief, no pain for approximately 7 to 10 days after the  procedure.  He states that he is gradually having return of his neck pain, right greater than left.  We discussed proceeding with radiofrequency ablation for the purpose of attaining long-term benefits.  Since the patient's right side is more painful, we will start with that side.  Risks and benefits of RFA, cervical region were discussed and patient would like to proceed.  Given muscle tightness and tenderness in his trapezius region, we discussed intramuscular Norflex and Toradol today.  Previous creatinine within normal limits.  Plan: -Right C3, C4, C5 RFA -IM Norflex and Toradol as below  Plan of Care  Pharmacotherapy (Medications Ordered): Meds ordered this encounter  Medications  . orphenadrine (NORFLEX) injection 30 mg  . ketorolac (TORADOL) 30 MG/ML injection 30 mg   Lab-work, procedure(s), and/or referral(s): Orders Placed This Encounter  Procedures  . Radiofrequency,Cervical    Time Note: Greater than 50% of the 25 minute(s) of face-to-face time spent with Mr. Percival, was spent in counseling/coordination of care regarding: the appropriate use of the pain scale, Mr. Effertz primary cause of pain, the treatment plan, treatment alternatives, the risks and possible complications of proposed treatment, going over the informed consent, the results, interpretation and  significance of  his recent diagnostic interventional treatment(s), realistic expectations and the goals of pain management (increased in functionality).  Provider-requested follow-up: Return for Procedure.  No future appointments.  Primary Care Physician: Casilda Carls, MD Location: [facility] Note by: Gillis Santa, M.D Date: 03/16/2018; Time: 3:02 PM  Patient Instructions  Radiofrequency Lesioning Radiofrequency lesioning is a procedure that is performed to relieve pain. The procedure is often used for back, neck, or arm pain. Radiofrequency lesioning involves the use of a machine that creates radio waves to make heat. During the procedure, the heat is applied to the nerve that carries the pain signal. The heat damages the nerve and interferes with the pain signal. Pain relief usually starts about 2 weeks after the procedure and lasts for 6 months to 1 year. Tell a health care provider about:  Any allergies you have.  All medicines you are taking, including vitamins, herbs, eye drops, creams, and over-the-counter medicines.  Any problems you or family members have had with anesthetic medicines.  Any blood disorders you have.  Any surgeries you have had.  Any medical conditions you have.  Whether you are pregnant or may be pregnant. What are the risks? Generally, this is a safe procedure. However, problems may occur, including:  Pain or soreness at the injection site.  Infection at the injection site.  Damage to nerves or blood vessels.  What happens before the procedure?  Ask your health care provider about: ? Changing or stopping your regular medicines. This is especially important if you are taking diabetes medicines or blood thinners. ? Taking medicines such as aspirin and ibuprofen. These medicines can thin your blood. Do not take these medicines before your procedure if your health care provider instructs you not to.  Follow  instructions from your health care provider about eating or drinking restrictions.  Plan to have someone take you home after the procedure.  If you go home right after the procedure, plan to have someone with you for 24 hours. What happens during the procedure?  You will be given one or more of the following: ? A medicine to help you relax (sedative). ? A medicine to numb the area (local anesthetic).  You will be awake during the procedure. You will need to be able  to talk with the health care provider during the procedure.  With the help of a type of X-ray (fluoroscopy), the health care provider will insert a radiofrequency needle into the area to be treated.  Next, a wire that carries the radio waves (electrode) will be put through the radiofrequency needle. An electrical pulse will be sent through the electrode to verify the correct nerve. You will feel a tingling sensation, and you may have muscle twitching.  Then, the tissue that is around the needle tip will be heated by an electric current that is passed using the radiofrequency machine. This will numb the nerves.  A bandage (dressing) will be put on the insertion area after the procedure is done. The procedure may vary among health care providers and hospitals. What happens after the procedure?  Your blood pressure, heart rate, breathing rate, and blood oxygen level will be monitored often until the medicines you were given have worn off.  Return to your normal activities as directed by your health care provider. This information is not intended to replace advice given to you by your health care provider. Make sure you discuss any questions you have with your health care provider. Document Released: 11/28/2010 Document Revised: 09/07/2015 Document Reviewed: 05/09/2014 Elsevier Interactive Patient Education  2018 [employer].  ____________________________________________________________________________________________  Preparing for Procedure with Sedation  Instructions: . Oral Intake: Do not eat or drink anything for at least 8 hours prior to your procedure. . Transportation: Public transportation is not allowed. Bring an adult driver. The driver must be physically present in our waiting room before any procedure can be started. Marland Kitchen Physical Assistance: Bring an adult physically capable of assisting you, in the event you need help. This adult should keep you company at home for at least 6 hours after the procedure. . Blood Pressure Medicine: Take your blood pressure medicine with a sip of water the morning of the procedure. . Blood thinners: Notify our staff if you are taking any blood thinners. Depending on which one you take, there will be specific instructions on how and when to stop it. . Diabetics on insulin: Notify the staff so that you can be scheduled 1st case in the morning. If your diabetes requires high dose insulin, take only  of your normal insulin dose the morning of the procedure and notify the staff that you have done so. . Preventing infections: Shower with an antibacterial soap the morning of your procedure. . Build-up your immune system: Take 1000 mg of Vitamin C with every meal (3 times a day) the day prior to your procedure. Marland Kitchen Antibiotics: Inform the staff if you have a condition or reason that requires you to take antibiotics before dental procedures. . Pregnancy: If you are pregnant, call and cancel the procedure. . Sickness: If you have a cold, fever, or any active infections, call and cancel the procedure. . Arrival: You must be in the facility at least 30 minutes prior to your scheduled procedure. . Children: Do not bring children with you. . Dress appropriately: Bring dark clothing that you would not mind if they get stained. . Valuables: Do not bring any jewelry or  valuables.  Procedure appointments are reserved for interventional treatments only. Marland Kitchen No Prescription Refills. . No medication changes will be discussed during procedure appointments. . No disability issues will be discussed.  Reasons to call and reschedule or cancel your procedure: (Following these recommendations will minimize the risk of a serious complication.) . Surgeries: Avoid having procedures  within 2 weeks of any surgery. (Avoid for 2 weeks before or after any surgery). . Flu Shots: Avoid having procedures within 2 weeks of a flu shots or . (Avoid for 2 weeks before or after immunizations). . Barium: Avoid having a procedure within 7-10 days after having had a radiological study involving the use of radiological contrast. (Myelograms, Barium swallow or enema study). . Heart attacks: Avoid any elective procedures or surgeries for the initial 6 months after a "Myocardial Infarction" (Heart Attack). . Blood thinners: It is imperative that you stop these medications before procedures. Let us know if you if you take any blood thinner.  . Infection: Avoid procedures during or within two weeks of an infection (including chest colds or gastrointestinal problems). Symptoms associated with infections include: Localized redness, fever, chills, night sweats or profuse sweating, burning sensation when voiding, cough, congestion, stuffiness, runny nose, sore throat, diarrhea, nausea, vomiting, cold or Flu symptoms, recent or current infections. It is specially important if the infection is over the area that we intend to treat. Marland Kitchen Heart and lung problems: Symptoms that may suggest an active cardiopulmonary problem include: cough, chest pain, breathing difficulties or shortness of breath, dizziness, ankle swelling, uncontrolled high or unusually low blood pressure, and/or palpitations. If you are experiencing any of these symptoms, cancel your procedure and contact your primary care physician for an  evaluation.  Remember:  Regular Business hours are:  Monday to Thursday 8:00 AM to 4:00 PM  Provider's Schedule: Milinda Pointer, MD:  Procedure days: Tuesday and Thursday 7:30 AM to 4:00 PM  Gillis Santa, MD:  Procedure days: Monday and Wednesday 7:30 AM to 4:00 PM ____________________________________________________________________________________________  ____________________________________________________________________________________________  Preparing for Procedure with Sedation  Instructions: . Oral Intake: Do not eat or drink anything for at least 8 hours prior to your procedure. . Transportation: Public transportation is not allowed. Bring an adult driver. The driver must be physically present in our waiting room before any procedure can be started. Marland Kitchen Physical Assistance: Bring an adult physically capable of assisting you, in the event you need help. This adult should keep you company at home for at least 6 hours after the procedure. . Blood Pressure Medicine: Take your blood pressure medicine with a sip of water the morning of the procedure. . Blood thinners: Notify our staff if you are taking any blood thinners. Depending on which one you take, there will be specific instructions on how and when to stop it. . Diabetics on insulin: Notify the staff so that you can be scheduled 1st case in the morning. If your diabetes requires high dose insulin, take only  of your normal insulin dose the morning of the procedure and notify the staff that you have done so. . Preventing infections: Shower with an antibacterial soap the morning of your procedure. . Build-up your immune system: Take 1000 mg of Vitamin C with every meal (3 times a day) the day prior to your procedure. Marland Kitchen Antibiotics: Inform the staff if you have a condition or reason that requires you to take antibiotics before dental procedures. . Pregnancy: If you are pregnant, call and cancel the procedure. . Sickness: If  you have a cold, fever, or any active infections, call and cancel the procedure. . Arrival: You must be in the facility at least 30 minutes prior to your scheduled procedure. . Children: Do not bring children with you. . Dress appropriately: Bring dark clothing that you would not mind if they get stained. . Valuables: Do  not bring any jewelry or valuables.  Procedure appointments are reserved for interventional treatments only. Marland Kitchen No Prescription Refills. . No medication changes will be discussed during procedure appointments. . No disability issues will be discussed.  Reasons to call and reschedule or cancel your procedure: (Following these recommendations will minimize the risk of a serious complication.) . Surgeries: Avoid having procedures within 2 weeks of any surgery. (Avoid for 2 weeks before or after any surgery). . Flu Shots: Avoid having procedures within 2 weeks of a flu shots or . (Avoid for 2 weeks before or after immunizations). . Barium: Avoid having a procedure within 7-10 days after having had a radiological study involving the use of radiological contrast. (Myelograms, Barium swallow or enema study). . Heart attacks: Avoid any elective procedures or surgeries for the initial 6 months after a "Myocardial Infarction" (Heart Attack). . Blood thinners: It is imperative that you stop these medications before procedures. Let us know if you if you take any blood thinner.  . Infection: Avoid procedures during or within two weeks of an infection (including chest colds or gastrointestinal problems). Symptoms associated with infections include: Localized redness, fever, chills, night sweats or profuse sweating, burning sensation when voiding, cough, congestion, stuffiness, runny nose, sore throat, diarrhea, nausea, vomiting, cold or Flu symptoms, recent or current infections. It is specially important if the infection is over the area that we intend to treat. Marland Kitchen Heart and lung problems:  Symptoms that may suggest an active cardiopulmonary problem include: cough, chest pain, breathing difficulties or shortness of breath, dizziness, ankle swelling, uncontrolled high or unusually low blood pressure, and/or palpitations. If you are experiencing any of these symptoms, cancel your procedure and contact your primary care physician for an evaluation.  Remember:  Regular Business hours are:  Monday to Thursday 8:00 AM to 4:00 PM  Provider's Schedule: Milinda Pointer, MD:  Procedure days: Tuesday and Thursday 7:30 AM to 4:00 PM  Gillis Santa, MD:  Procedure days: Monday and Wednesday 7:30 AM to 4:00 PM ____________________________________________________________________________________________

## 2018-04-16 ENCOUNTER — Ambulatory Visit: Payer: BLUE CROSS/BLUE SHIELD | Attending: Nurse Practitioner | Admitting: Physical Therapy

## 2018-04-16 ENCOUNTER — Encounter: Payer: Self-pay | Admitting: Physical Therapy

## 2018-04-16 DIAGNOSIS — M542 Cervicalgia: Secondary | ICD-10-CM | POA: Insufficient documentation

## 2018-04-16 NOTE — Therapy (Addendum)
[facility] [address] [place], [address] Phone: [phone]   Fax:  [phone]  Physical Therapy Evaluation  Patient Details  Name: Tony Benson MRN: [id number] Date of Birth: 1960-06-18 Referring Provider (PT): Holley Raring   Encounter Date: 04/16/2018    Past Medical History:  Diagnosis Date  . Asthma   . Hypertension     Past Surgical History:  Procedure Laterality Date  . CHOLECYSTECTOMY    . LUNG SURGERY     "ligation of thoracic duct"    There were no vitals filed for this visit.     OBJECTIVE  Mental Status Patient is oriented to person, place and time.  Recent memory is intact.  Remote memory is intact.  Attention span and concentration are intact.  Expressive speech is intact.  Patient's fund of knowledge is within normal limits for educational level.  SENSATION: Grossly intact to light touch bilateral UE as determined by testing dermatomes C2-T2 Proprioception and hot/cold testing deferred on this date   MUSCULOSKELETAL: Tremor: None Bulk: Normal Tone: Normal  Posture Elevated shoulders bilat, slight forward head  Gait Gait assessment deferred on this date   Palpation  Concordant sign with pain relief following STM with trpDN to R levator and R UT. Noted trigger points and tension at these areas   Strength R/L 5/5 Shoulder flexion (anterior deltoid/pec major/coracobrachialis, axillary n. (C5/6) and musculocutaneous n. (C5-7)) 5/5 Shoulder abduction (deltoid/supraspinatus, axillary/suprascapular n, C5) 5/5 Shoulder external rotation (infraspinatus/teres minor) 5/5 Shoulder internal rotation (subcapularis/lats/pec major) 5/5 Shoulder extension (posterior deltoid, lats, teres major, axillary/thoracodorsal n.) 5/5 Elbow flexion (biceps brachii, brachialis, brachioradialis, musculoskeletal n, C5/6) 5/5 Elbow extension (triceps, radial n, C7) 2+/3+ Upper trap (Y  position) 4+/4+ Mid trap (T position) 5/5 Lower trap (I position) Cervical isometrics are strong in all directions;  AROM R/L 50 Cervical Flexion 80 Cervical Extension 30/38 Cervical Lateral Flexion 59/70 Cervical Rotation C5/C8 Shoulder ER T10/T10 Shoulder IR 180/180 shoulder Flexion 180/180 shoulder abd *Indicates pain, overpressure performed unless otherwise indicated   PROM All passive shoulder and cervical motion wnl without pain   Passive Accessory Intervertebral Motion (PAIVM) Pt denies reproduction of neck pain with CPA C2-T7 and UPA bilaterally C1-T7. Generally hypomobile throughout  Passive Physiological Intervertebral Motion (PPIVM) Normal flexion and extension with PPIVM testing  Reflex Testing Biceps (C5/6): diminished bilat Brachioradialis (C5/6): diminished bilat Triceps (C7): diminished bilat Patellar reflexes: intact bilat  SPECIAL TESTS Spurlings A (ipsilateral lateral flexion/axial compression): negative bilat Spurlings B (ipsilateral lateral flexion/contralateral rotation/axial compression): negative bilat Distraction Test: Positive Hoffman Sign (cervical cord compression):negative bilat ULTT Median: negative bilat ULTT Ulnar: negative bilat ULTT Radial: negative bilat   Dry Needling: (4) 22mm .25 needles placed along the R UT and rhoboid and  to decrease increased muscular spasms and trigger points with the patient positioned in prone. Patient was educated on risks and benefits of therapy and verbally consents to PT.   Ther-Ex - UT stretch demo and return bilat 30sec hold (HEP) - Levator stretch demo and return bilat 30sec hold (HEP) - Scap retractions with shoulder lowering x10 with education to complete x20 every hour while at work/in car (HEP) - Education on muscle over activity and weakness d/t inability to contract muscle in chronic contracted state. Education on stretching for carry over of pain relief with TDN and importance of neutral  posture without shoulder hiking for carry over; verbalized understanding.       Marland Kitchen  Objective measurements completed on examination: See above findings.                PT Short Term Goals - 04/16/18 1717      PT SHORT TERM GOAL #1   Title  Pt will be independent with HEP in order to improve strength and decrease cervical pain in order to improve pain-free function at home and work.       Time  4    Period  Weeks    Status  New        PT Long Term Goals - 04/16/18 1717      PT LONG TERM GOAL #1   Title  Patient will increase FOTO score to 67 to demonstrate predicted increase in functional mobility to complete ADLs    Baseline  04/16/18 58    Time  8    Period  Weeks    Status  New      PT LONG TERM GOAL #2   Title  Pt will decrease worst neck pain as reported on NPRS by at least 2 points in order to demonstrate clinically significant reduction in back pain    Baseline  04/16/18 worst pain 8/10 best 3/10    Time  8    Period  Weeks    Status  New      PT LONG TERM GOAL #3   Title  Pt will increase strength UT in Y position of by at least 1/2 MMT grade in order to demonstrate improvement in strength and function.    Baseline  04/16/17 R 2+/5 L 3+/5    Time  8    Period  Weeks    Status  New      PT LONG TERM GOAL #4   Title  Patient will demonstrate active cervical rotation and lateral bending wnl bilat to increase safety with driving and ADLs    Baseline  04/16/17 see eval    Time  8    Period  Weeks    Status  New               Patient will benefit from skilled therapeutic intervention in order to improve the following deficits and impairments:  Decreased activity tolerance, Decreased endurance, Decreased range of motion, Decreased strength, Increased fascial restricitons, Impaired UE functional use, Improper body mechanics, Pain, Postural dysfunction, Impaired tone, Impaired flexibility, Increased muscle  spasms, Hypomobility, Decreased mobility  Visit Diagnosis: Cervicalgia - Plan: PT plan of care cert/re-cert     Problem List Patient Active Problem List   Diagnosis Date Noted  . Cervical facet joint syndrome (C2,3,4,5) 02/05/2018  . Arthropathy of cervical facet joint 02/05/2018  . Cervicalgia 02/05/2018  . Chronic pain syndrome 02/05/2018  . Hypertension   . Cellulitis and abscess of right thigh s/p I&D 09/24/2017 09/24/2017  . Asthma, chronic 08/04/2013   Shelton Silvas PT, DPT Shelton Silvas 04/21/2018, 9:32 AM  [facility] [address] [place], [address] Phone: [phone]   Fax:  [phone]  Name: Tony Benson MRN: [id number] Date of Birth: [birth year]

## 2018-04-21 ENCOUNTER — Encounter: Payer: Self-pay | Admitting: Physical Therapy

## 2018-04-21 ENCOUNTER — Ambulatory Visit: Payer: BLUE CROSS/BLUE SHIELD | Admitting: Physical Therapy

## 2018-04-21 DIAGNOSIS — M542 Cervicalgia: Secondary | ICD-10-CM

## 2018-04-21 NOTE — Therapy (Signed)
[facility] [address], [place], [address] Phone: [phone]   Fax:  [phone]  Physical Therapy Treatment  Patient Details  Name: Tony Benson MRN: [id number] Date of Birth: 01/05/1961 Referring Provider (PT): Lateef   Encounter Date: 04/21/2018  PT End of Session - 04/21/18 1316    Visit Number  2    Number of Visits  17    Date for PT Re-Evaluation  06/11/18    PT Start Time  0907    PT Stop Time  0950    PT Time Calculation (min)  43 min    Activity Tolerance  Patient tolerated treatment well    Behavior During Therapy  [facility] for tasks assessed/performed       Past Medical History:  Diagnosis Date  . Asthma   . Hypertension     Past Surgical History:  Procedure Laterality Date  . CHOLECYSTECTOMY    . LUNG SURGERY     "ligation of thoracic duct"    There were no vitals filed for this visit.  Subjective Assessment - 04/21/18 0912    Subjective  Patient reports improvement with TDN with pain reporting pain decreased to 2/10 in the days following TDN and began to hurt again in the past 24 hours 6/10. Reports compliance with HEP with no questions or concerns.     Pertinent History  Patient is a 58 year old male presenting post nerve block C2,3,4,5 02/16/18 following years of cervical pain from cervical facet joint syndrome. Patient reports the nerve block significantly helped his pain for a couple weeks, but his pain has returned. Patient reports his pain used to be very sharp and debilitating, but since nerve block is a dull, constant pain that is worse with "bad weather". Patient reports the nerve block decreased his pain 40%. Worst pain in the past week: 8/10 best: 3/10. Patient reports pain "where his R shoulder connects to his neck and runs up the neck occasionally all the way up to the back of his head". Reports pain is a dull, muscle cramp, ache, with occasional muscle spasm of R UT. Denies any  L sided cervical/shoulder pain. Patient denies tingling, burning, loss of sensation. 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 Pt denies N/V, unexplained weight fluctuation, B&B changes, saddle paresthesia, fever, night sweats, or unrelenting night pain at this time.    Limitations  House hold activities;Lifting    How long can you sit comfortably?  2 hours    How long can you stand comfortably?  unlimited    How long can you walk comfortably?  unlmited    Diagnostic tests  Xray and MRI    Patient Stated Goals  Decrease pain! increase mobility    Pain Onset  More than a month ago           Manual - Manual cervical traction 10sec traction 10sec relax x10 - STM with trigger point release to R UT and periscapular musculature. Following:  Dry Needling: (4) 78mm .25 needles placed along the R UT and rhomboid and  to decrease increased muscular spasms and trigger points with the patient positioned in prone. Following pec streching, TDN to pec minor with pt in prone, with pincer grasp, ensuring 2in from coracoid. Patient was educated on risks and benefits of therapy  and verbally consents to PT. In supine, decreased shoulder height with shoulder able to touch mat table following (before rounded 3in from table.    Ther-Ex - Lat pulldown x10 35#; 2 x10 55# with demo and max cuing initially with good carry over following - Standing rows 25# 3x 10 with demo and max TC/VC for scapular retraction without UT involvement with 75% carry over following - Doorway pec stretch 2x 30sec hold (HEP) - Supine pec stretch on rolled towel 95min - Postural education with education on purpose of posterior strengthening and ant stretching to demonstrate length/tension relationship and this effect on over/under activation, which creates postural dysfunction, leads to  pain                    PT Education - 04/21/18 1315    Education Details  Postural education, exercise form/technique    Person(s) Educated  Patient    Methods  Explanation;Demonstration;Tactile cues;Verbal cues    Comprehension  Returned demonstration;Verbalized understanding;Verbal cues required;Tactile cues required       PT Short Term Goals - 04/16/18 1717      PT SHORT TERM GOAL #1   Title  Pt will be independent with HEP in order to improve strength and decrease cervical pain in order to improve pain-free function at home and work.       Time  4    Period  Weeks    Status  New        PT Long Term Goals - 04/16/18 1717      PT LONG TERM GOAL #1   Title  Patient will increase FOTO score to 67 to demonstrate predicted increase in functional mobility to complete ADLs    Baseline  04/16/18 58    Time  8    Period  Weeks    Status  New      PT LONG TERM GOAL #2   Title  Pt will decrease worst neck pain as reported on NPRS by at least 2 points in order to demonstrate clinically significant reduction in back pain    Baseline  04/16/18 worst pain 8/10 best 3/10    Time  8    Period  Weeks    Status  New      PT LONG TERM GOAL #3   Title  Pt will increase strength UT in Y position of by at least 1/2 MMT grade in order to demonstrate improvement in strength and function.    Baseline  04/16/17 R 2+/5 L 3+/5    Time  8    Period  Weeks    Status  New      PT LONG TERM GOAL #4   Title  Patient will demonstrate active cervical rotation and lateral bending wnl bilat to increase safety with driving and ADLs    Baseline  04/16/17 see eval    Time  8    Period  Weeks    Status  New            Plan - 04/21/18 1321    Clinical Impression Statement  PT led patient through therex for postural strengthening with max cuing needed to prevent shoulder hiking, and to ensure full scapular retraction. Patient is able comply with cuing for the most part to correct posture  following manual techniques and stretching. PT educated patient on proper postural habits and purpose of therex for periscapular musculature, with patient verbalizing understanding of all provided education.  Clinical Presentation  Evolving    Clinical Decision Making  Moderate    Rehab Potential  Good    Clinical Impairments Affecting Rehab Potential  (+) motivation, fairly young age, social support (-) chronicity of symptoms, other comorbidities (chronic pain), sedentary lifestyle    PT Frequency  2x / week    PT Duration  8 weeks    PT Treatment/Interventions  Taping;Passive range of motion;Dry needling;Manual techniques;Patient/family education;Neuromuscular re-education;Therapeutic exercise;Functional mobility training;Therapeutic activities;DME Instruction;Ultrasound;Traction;Moist Heat;Electrical Stimulation;Aquatic Therapy;Cryotherapy;ADLs/Self Care Home Management    PT Next Visit Plan  decrease soft tissue restrictions, postural re-training    PT Home Exercise Plan  levator stretch, UT stretch, scap retractions, ergonomic/postural carry over    Consulted and Agree with Plan of Care  Patient       Patient will benefit from skilled therapeutic intervention in order to improve the following deficits and impairments:  Decreased activity tolerance, Decreased endurance, Decreased range of motion, Decreased strength, Increased fascial restricitons, Impaired UE functional use, Improper body mechanics, Pain, Postural dysfunction, Impaired tone, Impaired flexibility, Increased muscle spasms, Hypomobility, Decreased mobility  Visit Diagnosis: Cervicalgia     Problem List Patient Active Problem List   Diagnosis Date Noted  . Cervical facet joint syndrome (C2,3,4,5) 02/05/2018  . Arthropathy of cervical facet joint 02/05/2018  . Cervicalgia 02/05/2018  . Chronic pain syndrome 02/05/2018  . Hypertension   . Cellulitis and abscess of right thigh s/p I&D 09/24/2017 09/24/2017  . Asthma,  chronic 08/04/2013   Shelton Silvas PT, DPT Shelton Silvas 04/21/2018, 1:27 PM  [facility] [address] [place], [address] Phone: [phone]   Fax:  [phone]  Name: MACKLEN WILHOITE MRN: [id number] Date of Birth: [birth year]

## 2018-04-23 ENCOUNTER — Encounter: Payer: Self-pay | Admitting: Physical Therapy

## 2018-04-23 ENCOUNTER — Ambulatory Visit: Payer: BLUE CROSS/BLUE SHIELD | Admitting: Physical Therapy

## 2018-04-23 DIAGNOSIS — M542 Cervicalgia: Secondary | ICD-10-CM | POA: Diagnosis not present

## 2018-04-23 NOTE — Therapy (Signed)
[facility] [address] [place], [address] Phone: [phone]   Fax:  [phone]  Physical Therapy Treatment  Patient Details  Name: Tony Benson MRN: [id number] Date of Birth: Aug 16, 1960 Referring Provider (PT): Holley Raring   Encounter Date: 04/23/2018  PT End of Session - 04/23/18 0928    Visit Number  3    Number of Visits  17    Date for PT Re-Evaluation  06/11/18    PT Start Time  0909    PT Stop Time  0947    PT Time Calculation (min)  38 min    Activity Tolerance  Patient tolerated treatment well    Behavior During Therapy  [facility] for tasks assessed/performed       Past Medical History:  Diagnosis Date  . Asthma   . Hypertension     Past Surgical History:  Procedure Laterality Date  . CHOLECYSTECTOMY    . LUNG SURGERY     "ligation of thoracic duct"    There were no vitals filed for this visit.  Subjective Assessment - 04/23/18 0913    Subjective  Patient reports 4/10 pain this am with pain subsided following last session. Patient reports he no longer has any shooting sharp pain, which he is very happy with. Patient reports his pain is doing better overall and he is deligently completing his HEP.     Pertinent History  Patient is a 58 year old male presenting post nerve block C2,3,4,5 02/16/18 following years of cervical pain from cervical facet joint syndrome. Patient reports the nerve block significantly helped his pain for a couple weeks, but his pain has returned. Patient reports his pain used to be very sharp and debilitating, but since nerve block is a dull, constant pain that is worse with "bad weather". Patient reports the nerve block decreased his pain 40%. Worst pain in the past week: 8/10 best: 3/10. Patient reports pain "where his R shoulder connects to his neck and runs up the neck occasionally all the way up to the back of his head". Reports pain is a dull, muscle cramp, ache, with  occasional muscle spasm of R UT. Denies any L sided cervical/shoulder pain. Patient denies tingling, burning, loss of sensation. 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 Pt denies N/V, unexplained weight fluctuation, B&B changes, saddle paresthesia, fever, night sweats, or unrelenting night pain at this time.    Limitations  House hold activities;Lifting    How long can you sit comfortably?  2 hours    How long can you stand comfortably?  unlimited    How long can you walk comfortably?  unlmited    Diagnostic tests  Xray and MRI    Patient Stated Goals  Decrease pain! increase mobility    Pain Onset  More than a month ago       Manual - Manual cervical traction 10sec traction 10sec relax x10 - STM with trigger point release to R UT and periscapular musculature. Following:  Dry Needling: (4) 90mm .30 needles placed along the b/lUT and R rhomboid andto decrease increased muscular spasms and trigger points with the patient positioned in prone. Following pec streching, TDN to pec minor with pt in prone, with pincer grasp, ensuring 2in from coracoid. Patient was educated on risks and benefits of therapy  and verbally consents to PT.In supine, decreased shoulder height b/l with shoulder able to touch mat table following    Ther-Ex - Lat pulldown 2 x10 55# with good carry over and only min, occasional cuing at the end of sets to prevent shoulder hiking - Seated rows 55# 3x 10 with cuing for proper posture without thoracic kyphosis with good postural carry over following TC  - Scaption 4# DB each UE x10; 5# 2x 10  with TC initially for proper posture with maintained scapular retraction with good carry over following - Low rows with elbow ext BTB 3x 10 with initial TC to decrease shoulder shrug b/l with good carry over following                          PT Education - 04/23/18 0928    Education Details  Exercise form with continued postural education    Person(s) Educated  Patient    Methods  Explanation;Demonstration;Verbal cues    Comprehension  Verbalized understanding;Returned demonstration;Verbal cues required       PT Short Term Goals - 04/16/18 1717      PT SHORT TERM GOAL #1   Title  Pt will be independent with HEP in order to improve strength and decrease cervical pain in order to improve pain-free function at home and work.       Time  4    Period  Weeks    Status  New        PT Long Term Goals - 04/16/18 1717      PT LONG TERM GOAL #1   Title  Patient will increase FOTO score to 67 to demonstrate predicted increase in functional mobility to complete ADLs    Baseline  04/16/18 58    Time  8    Period  Weeks    Status  New      PT LONG TERM GOAL #2   Title  Pt will decrease worst neck pain as reported on NPRS by at least 2 points in order to demonstrate clinically significant reduction in back pain    Baseline  04/16/18 worst pain 8/10 best 3/10    Time  8    Period  Weeks    Status  New      PT LONG TERM GOAL #3   Title  Pt will increase strength UT in Y position of by at least 1/2 MMT grade in order to demonstrate improvement in strength and function.    Baseline  04/16/17 R 2+/5 L 3+/5    Time  8    Period  Weeks    Status  New      PT LONG TERM GOAL #4   Title  Patient will demonstrate active cervical rotation and lateral bending wnl bilat to increase safety with driving and ADLs    Baseline  04/16/17 see eval    Time  8    Period  Weeks    Status  New            Plan - 04/23/18 7616    Clinical Impression Statement  PT continued manual + TDN to decrease muscle tension, wich patient continues to respond well to with decreased pain to 0/10 following and noted decreased shoulder height/improved posture following. PT continued therex progression for postural  strengthening, which patient is able to tolerate well with no increased pain, only noted muscle fatigue, requring some cuing for accuracy/proper posture.  Clinical Presentation  Evolving    Clinical Decision Making  Moderate    Rehab Potential  Good    Clinical Impairments Affecting Rehab Potential  (+) motivation, fairly young age, social support (-) chronicity of symptoms, other comorbidities (chronic pain), sedentary lifestyle    PT Frequency  2x / week    PT Duration  8 weeks    PT Treatment/Interventions  Taping;Passive range of motion;Dry needling;Manual techniques;Patient/family education;Neuromuscular re-education;Therapeutic exercise;Functional mobility training;Therapeutic activities;DME Instruction;Ultrasound;Traction;Moist Heat;Electrical Stimulation;Aquatic Therapy;Cryotherapy;ADLs/Self Care Home Management    PT Next Visit Plan  decrease soft tissue restrictions, postural re-training    PT Home Exercise Plan  levator stretch, UT stretch, scap retractions, ergonomic/postural carry over    Consulted and Agree with Plan of Care  Patient       Patient will benefit from skilled therapeutic intervention in order to improve the following deficits and impairments:  Decreased activity tolerance, Decreased endurance, Decreased range of motion, Decreased strength, Increased fascial restricitons, Impaired UE functional use, Improper body mechanics, Pain, Postural dysfunction, Impaired tone, Impaired flexibility, Increased muscle spasms, Hypomobility, Decreased mobility  Visit Diagnosis: Cervicalgia     Problem List Patient Active Problem List   Diagnosis Date Noted  . Cervical facet joint syndrome (C2,3,4,5) 02/05/2018  . Arthropathy of cervical facet joint 02/05/2018  . Cervicalgia 02/05/2018  . Chronic pain syndrome 02/05/2018  . Hypertension   . Cellulitis and abscess of right thigh s/p I&D 09/24/2017 09/24/2017  . Asthma, chronic 08/04/2013   Shelton Silvas PT, DPT Shelton Silvas 04/23/2018, 10:13 AM  [facility] [address], [place], [address] Phone: [phone]   Fax:  [phone]  Name: VASHAWN EKSTEIN MRN: [id number] Date of Birth: 1960/05/06

## 2018-04-28 ENCOUNTER — Ambulatory Visit: Payer: BLUE CROSS/BLUE SHIELD | Admitting: Physical Therapy

## 2018-04-28 ENCOUNTER — Encounter: Payer: Self-pay | Admitting: Physical Therapy

## 2018-04-28 DIAGNOSIS — M542 Cervicalgia: Secondary | ICD-10-CM | POA: Diagnosis not present

## 2018-04-28 NOTE — Therapy (Signed)
[facility] [address], [place], [address] Phone: [phone]   Fax:  [phone]  Physical Therapy Treatment  Patient Details  Name: Tony Benson MRN: [id number] Date of Birth: 1960/11/20 Referring Provider (PT): Lateef   Encounter Date: 04/28/2018  PT End of Session - 04/28/18 0911    Visit Number  4    Number of Visits  17    Date for PT Re-Evaluation  06/11/18    PT Start Time  0905    PT Stop Time  0945    PT Time Calculation (min)  40 min    Activity Tolerance  Patient tolerated treatment well    Behavior During Therapy  [facility] for tasks assessed/performed       Past Medical History:  Diagnosis Date  . Asthma   . Hypertension     Past Surgical History:  Procedure Laterality Date  . CHOLECYSTECTOMY    . LUNG SURGERY     "ligation of thoracic duct"    There were no vitals filed for this visit.  Subjective Assessment - 04/28/18 0909    Subjective  Patient reports he felt good following last session, with pain that came on this am following working. Patient reports 8/10 this morning. Patient reports he had to work through the night at his computer and feels like this has contributed to his increased pain.     Pertinent History  Patient is a 58 year old male presenting post nerve block C2,3,4,5 02/16/18 following years of cervical pain from cervical facet joint syndrome. Patient reports the nerve block significantly helped his pain for a couple weeks, but his pain has returned. Patient reports his pain used to be very sharp and debilitating, but since nerve block is a dull, constant pain that is worse with "bad weather". Patient reports the nerve block decreased his pain 40%. Worst pain in the past week: 8/10 best: 3/10. Patient reports pain "where his R shoulder connects to his neck and runs up the neck occasionally all the way up to the back of his head". Reports pain is a dull, muscle cramp, ache,  with occasional muscle spasm of R UT. Denies any L sided cervical/shoulder pain. Patient denies tingling, burning, loss of sensation. 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 Pt denies N/V, unexplained weight fluctuation, B&B changes, saddle paresthesia, fever, night sweats, or unrelenting night pain at this time.    Limitations  House hold activities;Lifting    How long can you sit comfortably?  2 hours    How long can you stand comfortably?  unlimited    How long can you walk comfortably?  unlmited    Diagnostic tests  Xray and MRI    Patient Stated Goals  Decrease pain! increase mobility    Pain Onset  More than a month ago         Manual - Manual cervical traction 10sec traction 10sec relax x10 - STM withtrigger point releaseto B/L UT, R pec minor,  and periscapular musculature. Following: Dry Needling: (4) 55mm .30 needles placed along the b/lUT to decrease increased muscular spasms and trigger points with the patient positioned in prone utilizing pincer grasp - T6-T10 grade III mobs 30sec bouts 4 bouts per segment for inc ROM   Ther-Ex - Supine pec stretch towel roll  58min  - Doorway pec stretch 2x 45sec hold - Seated thoracic ext x20 with towel roll with hands behind head 2sec hold  - Lat pulldown 2 x10 55# with good carry over and only min, occasional cuing at the end of sets to prevent shoulder hiking - Low rows with elbow ext BTB 3x 10 with initial TC to decrease shoulder shrug b/l with good carry over following                         PT Education - 04/28/18 1256    Education Details  Exercise form    Person(s) Educated  Patient    Methods  Explanation;Demonstration;Tactile cues;Verbal cues    Comprehension  Verbalized understanding;Returned demonstration       PT Short Term Goals -  04/16/18 1717      PT SHORT TERM GOAL #1   Title  Pt will be independent with HEP in order to improve strength and decrease cervical pain in order to improve pain-free function at home and work.       Time  4    Period  Weeks    Status  New        PT Long Term Goals - 04/16/18 1717      PT LONG TERM GOAL #1   Title  Patient will increase FOTO score to 67 to demonstrate predicted increase in functional mobility to complete ADLs    Baseline  04/16/18 58    Time  8    Period  Weeks    Status  New      PT LONG TERM GOAL #2   Title  Pt will decrease worst neck pain as reported on NPRS by at least 2 points in order to demonstrate clinically significant reduction in back pain    Baseline  04/16/18 worst pain 8/10 best 3/10    Time  8    Period  Weeks    Status  New      PT LONG TERM GOAL #3   Title  Pt will increase strength UT in Y position of by at least 1/2 MMT grade in order to demonstrate improvement in strength and function.    Baseline  04/16/17 R 2+/5 L 3+/5    Time  8    Period  Weeks    Status  New      PT LONG TERM GOAL #4   Title  Patient will demonstrate active cervical rotation and lateral bending wnl bilat to increase safety with driving and ADLs    Baseline  04/16/17 see eval    Time  8    Period  Weeks    Status  New            Plan - 04/28/18 1256    Clinical Impression Statement  Patient with increased pain this session, requiring increased manual techniques to resolve. Following manual with therex to encourage compliance, patient reports decreased pain, with better posture noted. PT discussed sleep hygeine to decrease muscle tension as well, patient verbalized understanding. Patient will continue to benefit from skilled PT to address deficits    Rehab Potential  Good    Clinical Impairments Affecting Rehab Potential  (+) motivation, fairly young age, social support (-) chronicity of symptoms, other comorbidities (chronic pain), sedentary lifestyle    PT  Frequency  2x / week    PT Treatment/Interventions  Taping;Passive range of motion;Dry needling;Manual techniques;Patient/family education;Neuromuscular re-education;Therapeutic exercise;Functional mobility training;Therapeutic activities;DME  Instruction;Ultrasound;Traction;Moist Heat;Electrical Stimulation;Aquatic Therapy;Cryotherapy;ADLs/Self Care Home Management    PT Next Visit Plan  decrease soft tissue restrictions, postural re-training    PT Home Exercise Plan  levator stretch, UT stretch, scap retractions, ergonomic/postural carry over    Consulted and Agree with Plan of Care  Patient       Patient will benefit from skilled therapeutic intervention in order to improve the following deficits and impairments:  Decreased activity tolerance, Decreased endurance, Decreased range of motion, Decreased strength, Increased fascial restricitons, Impaired UE functional use, Improper body mechanics, Pain, Postural dysfunction, Impaired tone, Impaired flexibility, Increased muscle spasms, Hypomobility, Decreased mobility  Visit Diagnosis: Cervicalgia     Problem List Patient Active Problem List   Diagnosis Date Noted  . Cervical facet joint syndrome (C2,3,4,5) 02/05/2018  . Arthropathy of cervical facet joint 02/05/2018  . Cervicalgia 02/05/2018  . Chronic pain syndrome 02/05/2018  . Hypertension   . Cellulitis and abscess of right thigh s/p I&D 09/24/2017 09/24/2017  . Asthma, chronic 08/04/2013   Tony Benson PT, DPT Tony Benson 04/28/2018, 1:03 PM  [facility] [address], [place], [address] Phone: [phone]   Fax:  [phone]  Name: Tony Benson MRN: [id number] Date of Birth: Mar 07, 1961

## 2018-04-30 ENCOUNTER — Ambulatory Visit: Payer: BLUE CROSS/BLUE SHIELD | Admitting: Physical Therapy

## 2018-05-05 ENCOUNTER — Encounter: Payer: Self-pay | Admitting: Physical Therapy

## 2018-05-05 ENCOUNTER — Ambulatory Visit: Payer: BLUE CROSS/BLUE SHIELD | Admitting: Physical Therapy

## 2018-05-05 DIAGNOSIS — M542 Cervicalgia: Secondary | ICD-10-CM

## 2018-05-05 NOTE — Therapy (Signed)
[facility] [address] [place], [address] Phone: [phone]   Fax:  [phone]  Physical Therapy Treatment  Patient Details  Name: Tony Benson MRN: [id number] Date of Birth: [birth year] Referring Provider (PT): Lateef   Encounter Date: 05/05/2018  PT End of Session - 05/05/18 0928    Visit Number  5    Number of Visits  17    Date for PT Re-Evaluation  06/11/18    PT Start Time  0903    PT Stop Time  0945    PT Time Calculation (min)  42 min    Activity Tolerance  Patient tolerated treatment well    Behavior During Therapy  [facility] for tasks assessed/performed       Past Medical History:  Diagnosis Date  . Asthma   . Hypertension     Past Surgical History:  Procedure Laterality Date  . CHOLECYSTECTOMY    . LUNG SURGERY     "ligation of thoracic duct"    There were no vitals filed for this visit.  Subjective Assessment - 05/05/18 0907    Subjective  Patient reports he felt good following last session, but that after flying to [facility] [place] it has increased. Reports 7/10 today. Reports compliance with his HEP with no questions or concerns.     Pertinent History  Patient is a 58 year old male presenting post nerve block C2,3,4,5 02/16/18 following years of cervical pain from cervical facet joint syndrome. Patient reports the nerve block significantly helped his pain for a couple weeks, but his pain has returned. Patient reports his pain used to be very sharp and debilitating, but since nerve block is a dull, constant pain that is worse with "bad weather". Patient reports the nerve block decreased his pain 40%. Worst pain in the past week: 8/10 best: 3/10. Patient reports pain "where his R shoulder connects to his neck and runs up the neck occasionally all the way up to the back of his head". Reports pain is a dull, muscle cramp, ache, with occasional muscle spasm of R UT. Denies any L sided  cervical/shoulder pain. Patient denies tingling, burning, loss of sensation. 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 Pt denies N/V, unexplained weight fluctuation, B&B changes, saddle paresthesia, fever, night sweats, or unrelenting night pain at this time.    Limitations  House hold activities;Lifting    How long can you sit comfortably?  2 hours    How long can you stand comfortably?  unlimited    How long can you walk comfortably?  unlmited    Diagnostic tests  Xray and MRI    Patient Stated Goals  Decrease pain! increase mobility    Pain Onset  More than a month ago        Manual - Manual cervical traction 10sec traction 10sec relax x10 - STM withtrigger point releaseto B/L UT, R pec minor,  and periscapular musculature. Following: Dry Needling: (4) 95mm .30needles placed along the RUT and levatorto decrease increased muscular spasms and trigger points with the patient positioned in prone utilizing pincer grasp - T6-T10 grade III mobs 30sec bouts 4 bouts per segment for inc ROM   Ther-Ex - Scaption with 3# 3x 10 with TC initially to prevent shoulder hiking, and VC for breath control - Standing  rows 20# 3x 10 with good carry over from previous session, with min cuing to prevent shoulder hiking - Scapular punches supine 3# DB b/l with TC initially for full scapular retraction and VC for eccentric control, with good carry over following                           PT Education - 05/05/18 0927    Education Details  Exercise form    Person(s) Educated  Patient    Methods  Explanation;Demonstration;Verbal cues    Comprehension  Verbalized understanding;Returned demonstration;Verbal cues required       PT Short Term Goals - 04/16/18 1717      PT SHORT TERM GOAL #1   Title  Pt will be independent  with HEP in order to improve strength and decrease cervical pain in order to improve pain-free function at home and work.       Time  4    Period  Weeks    Status  New        PT Long Term Goals - 04/16/18 1717      PT LONG TERM GOAL #1   Title  Patient will increase FOTO score to 67 to demonstrate predicted increase in functional mobility to complete ADLs    Baseline  04/16/18 58    Time  8    Period  Weeks    Status  New      PT LONG TERM GOAL #2   Title  Pt will decrease worst neck pain as reported on NPRS by at least 2 points in order to demonstrate clinically significant reduction in back pain    Baseline  04/16/18 worst pain 8/10 best 3/10    Time  8    Period  Weeks    Status  New      PT LONG TERM GOAL #3   Title  Pt will increase strength UT in Y position of by at least 1/2 MMT grade in order to demonstrate improvement in strength and function.    Baseline  04/16/17 R 2+/5 L 3+/5    Time  8    Period  Weeks    Status  New      PT LONG TERM GOAL #4   Title  Patient will demonstrate active cervical rotation and lateral bending wnl bilat to increase safety with driving and ADLs    Baseline  04/16/17 see eval    Time  8    Period  Weeks    Status  New            Plan - 05/05/18 0943    Clinical Impression Statement  Patient with increased pain following flight, sleeping on uncomfortable mattress and work stress. PT encouraged patient that increased neck/shoulder tension following long flight is normal, and to not be discouraged by this. Patient continues to respond well to manual + TDN techniques, and is able to complete all therex with accuracy following cuing without increased pain. FOllowing session patient reports only 4/10 pain. PT encouraged HEP compliance to maintain session gains.     Rehab Potential  Good    Clinical Impairments Affecting Rehab Potential  (+) motivation, fairly young age, social support (-) chronicity of symptoms, other comorbidities (chronic  pain), sedentary lifestyle    PT Frequency  2x / week    PT Duration  8 weeks    PT Treatment/Interventions  Taping;Passive range of motion;Dry needling;Manual techniques;Patient/family education;Neuromuscular re-education;Therapeutic  exercise;Functional mobility training;Therapeutic activities;DME Instruction;Ultrasound;Traction;Moist Heat;Electrical Stimulation;Aquatic Therapy;Cryotherapy;ADLs/Self Care Home Management    PT Next Visit Plan  decrease soft tissue restrictions, postural re-training    PT Home Exercise Plan  levator stretch, UT stretch, scap retractions, ergonomic/postural carry over    Consulted and Agree with Plan of Care  Patient       Patient will benefit from skilled therapeutic intervention in order to improve the following deficits and impairments:  Decreased activity tolerance, Decreased endurance, Decreased range of motion, Decreased strength, Increased fascial restricitons, Impaired UE functional use, Improper body mechanics, Pain, Postural dysfunction, Impaired tone, Impaired flexibility, Increased muscle spasms, Hypomobility, Decreased mobility  Visit Diagnosis: Cervicalgia     Problem List Patient Active Problem List   Diagnosis Date Noted  . Cervical facet joint syndrome (C2,3,4,5) 02/05/2018  . Arthropathy of cervical facet joint 02/05/2018  . Cervicalgia 02/05/2018  . Chronic pain syndrome 02/05/2018  . Hypertension   . Cellulitis and abscess of right thigh s/p I&D 09/24/2017 09/24/2017  . Asthma, chronic 08/04/2013   Shelton Silvas PT, DPT Shelton Silvas 05/05/2018, 10:22 AM  [facility] [address], [place], [address] Phone: [phone]   Fax:  [phone]  Name: Tony Benson MRN: [id number] Date of Birth: 18-Feb-1961

## 2018-05-07 ENCOUNTER — Ambulatory Visit: Payer: BLUE CROSS/BLUE SHIELD | Admitting: Physical Therapy

## 2018-05-07 ENCOUNTER — Encounter: Payer: Self-pay | Admitting: Physical Therapy

## 2018-05-07 DIAGNOSIS — M542 Cervicalgia: Secondary | ICD-10-CM | POA: Diagnosis not present

## 2018-05-07 NOTE — Therapy (Signed)
[facility] [address], [place], [address] Phone: [phone]   Fax:  [phone]  Physical Therapy Treatment  Patient Details  Name: Tony Benson MRN: [id number] Date of Birth: 21-Nov-1960 Referring Provider (PT): Holley Raring   Encounter Date: 05/07/2018  PT End of Session - 05/07/18 0938    Visit Number  6    Number of Visits  17    Date for PT Re-Evaluation  06/11/18    PT Start Time  0910    PT Stop Time  0949    PT Time Calculation (min)  39 min    Activity Tolerance  Patient tolerated treatment well    Behavior During Therapy  [facility] for tasks assessed/performed       Past Medical History:  Diagnosis Date  . Asthma   . Hypertension     Past Surgical History:  Procedure Laterality Date  . CHOLECYSTECTOMY    . LUNG SURGERY     "ligation of thoracic duct"    There were no vitals filed for this visit.  Subjective Assessment - 05/07/18 0914    Subjective  Patient reports since last night he has had increased pain/tension along medial L shoulder blade, and UT that woke him up from his sleep. Reports 10/10 pain from this. Patient reports LBP starting over this week to after being back from vacation. Patient reports this pain is 6/10.     Pertinent History  Patient is a 58 year old male presenting post nerve block C2,3,4,5 02/16/18 following years of cervical pain from cervical facet joint syndrome. Patient reports the nerve block significantly helped his pain for a couple weeks, but his pain has returned. Patient reports his pain used to be very sharp and debilitating, but since nerve block is a dull, constant pain that is worse with "bad weather". Patient reports the nerve block decreased his pain 40%. Worst pain in the past week: 8/10 best: 3/10. Patient reports pain "where his R shoulder connects to his neck and runs up the neck occasionally all the way up to the back of his head". Reports pain is a dull,  muscle cramp, ache, with occasional muscle spasm of R UT. Denies any L sided cervical/shoulder pain. Patient denies tingling, burning, loss of sensation. 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 Pt denies N/V, unexplained weight fluctuation, B&B changes, saddle paresthesia, fever, night sweats, or unrelenting night pain at this time.    Limitations  House hold activities;Lifting    How long can you sit comfortably?  2 hours    How long can you stand comfortably?  unlimited    How long can you walk comfortably?  unlmited    Diagnostic tests  Xray and MRI    Patient Stated Goals  Decrease pain! increase mobility    Pain Onset  More than a month ago          Manual - STM withtrigger point releasetoLUT,and periscapular musculature. Inc time spent on L periscapular musculature(rhomboids, levator, mid trap) per patients c/o pain. Following: Dry Needling: (4) 58mm .30needles placed along the RUT and levatorto decrease increased muscular spasms and trigger points with the patient positioned in prone utilizing pincer grasp - T1-T10 grade II mobs 30sec bouts 4 bouts per segment for pain management; Inc to grade III for same  range to increase ROM - During ESTIM (63mins) L3-S1 grade II mobs 30sec bouts 4 bouts each segment    ESTIM + heat pack [employer] ESTIM 15 min at patient tolerated 160V increased to 190V through treatment at L scapular midline, and 150V increased to 170V at L UT . Attempted to decrease muscle tension at this area and decrease man. With PT assessing patient tolerance throughout (increasing intensity as needed), monitoring skin integrity (normal), with decreased pain noted from patient. Manual techniques used at the beginning, postural education continued for remaining minutes.                          PT Education - 05/07/18 0936    Education Details  ESTIM education, continued posture education    Person(s) Educated  Patient    Methods  Explanation    Comprehension  Verbalized understanding       PT Short Term Goals - 04/16/18 1717      PT SHORT TERM GOAL #1   Title  Pt will be independent with HEP in order to improve strength and decrease cervical pain in order to improve pain-free function at home and work.       Time  4    Period  Weeks    Status  New        PT Long Term Goals - 04/16/18 1717      PT LONG TERM GOAL #1   Title  Patient will increase FOTO score to 67 to demonstrate predicted increase in functional mobility to complete ADLs    Baseline  04/16/18 58    Time  8    Period  Weeks    Status  New      PT LONG TERM GOAL #2   Title  Pt will decrease worst neck pain as reported on NPRS by at least 2 points in order to demonstrate clinically significant reduction in back pain    Baseline  04/16/18 worst pain 8/10 best 3/10    Time  8    Period  Weeks    Status  New      PT LONG TERM GOAL #3   Title  Pt will increase strength UT in Y position of by at least 1/2 MMT grade in order to demonstrate improvement in strength and function.    Baseline  04/16/17 R 2+/5 L 3+/5    Time  8    Period  Weeks    Status  New      PT LONG TERM GOAL #4   Title  Patient will demonstrate active cervical rotation and lateral bending wnl bilat to increase safety with driving and ADLs    Baseline  04/16/17 see eval    Time  8    Period  Weeks    Status  New            Plan - 05/07/18 2694    Clinical Impression Statement  Patient with increased pain this session, requiring prolonged manual ltechniques to subside. PT utilized modalities in conjunction for pain modulation. PT held off on therex progression this session d/t excessive pain from patient and time constraints (d/t pt being late). PT encouraged HEP compliance to maintain  strength gains until next session, and stretching to maintain session gains. Reports 5/10 pain at the end of session. Will continue to benefit from skilled PT to address impairments to return to PLOF.    Clinical Presentation  Evolving  Clinical Decision Making  Moderate    Rehab Potential  Good    Clinical Impairments Affecting Rehab Potential  (+) motivation, fairly young age, social support (-) chronicity of symptoms, other comorbidities (chronic pain), sedentary lifestyle    PT Frequency  2x / week    PT Duration  8 weeks    PT Treatment/Interventions  Taping;Passive range of motion;Dry needling;Manual techniques;Patient/family education;Neuromuscular re-education;Therapeutic exercise;Functional mobility training;Therapeutic activities;DME Instruction;Ultrasound;Traction;Moist Heat;Electrical Stimulation;Aquatic Therapy;Cryotherapy;ADLs/Self Care Home Management    PT Next Visit Plan  decrease soft tissue restrictions, postural re-training    PT Home Exercise Plan  levator stretch, UT stretch, scap retractions, ergonomic/postural carry over    Consulted and Agree with Plan of Care  Patient       Patient will benefit from skilled therapeutic intervention in order to improve the following deficits and impairments:  Decreased activity tolerance, Decreased endurance, Decreased range of motion, Decreased strength, Increased fascial restricitons, Impaired UE functional use, Improper body mechanics, Pain, Postural dysfunction, Impaired tone, Impaired flexibility, Increased muscle spasms, Hypomobility, Decreased mobility  Visit Diagnosis: Cervicalgia     Problem List Patient Active Problem List   Diagnosis Date Noted  . Cervical facet joint syndrome (C2,3,4,5) 02/05/2018  . Arthropathy of cervical facet joint 02/05/2018  . Cervicalgia 02/05/2018  . Chronic pain syndrome 02/05/2018  . Hypertension   . Cellulitis and abscess of right thigh s/p I&D 09/24/2017 09/24/2017  . Asthma, chronic  08/04/2013    Shelton Silvas 05/07/2018, 9:48 AM  [facility] [address] [place], [address] Phone: [phone]   Fax:  [phone]  Name: Tony Benson MRN: [id number] Date of Birth: August 02, 1960

## 2018-05-12 ENCOUNTER — Encounter: Payer: Self-pay | Admitting: Physical Therapy

## 2018-05-12 ENCOUNTER — Ambulatory Visit: Payer: BLUE CROSS/BLUE SHIELD | Admitting: Physical Therapy

## 2018-05-12 DIAGNOSIS — M542 Cervicalgia: Secondary | ICD-10-CM | POA: Diagnosis not present

## 2018-05-12 NOTE — Therapy (Signed)
[facility] [address] [place], [address] Phone: [phone]   Fax:  [phone]  Physical Therapy Treatment  Patient Details  Name: Tony Benson MRN: [id number] Date of Birth: December 01, 1960 Referring Provider (PT): Lateef   Encounter Date: 05/12/2018  PT End of Session - 05/12/18 0939    Visit Number  7    Number of Visits  17    Date for PT Re-Evaluation  06/11/18    PT Start Time  0912    PT Stop Time  0945    PT Time Calculation (min)  33 min    Activity Tolerance  Patient tolerated treatment well    Behavior During Therapy  [facility] for tasks assessed/performed       Past Medical History:  Diagnosis Date  . Asthma   . Hypertension     Past Surgical History:  Procedure Laterality Date  . CHOLECYSTECTOMY    . LUNG SURGERY     "ligation of thoracic duct"    There were no vitals filed for this visit.  Subjective Assessment - 05/12/18 0916    Subjective  Patient reports he felt much better after last session. Reports 5/10 pain today and abolishment of LBP. Patient reports compliance with HEP with no questions or concerns.     Pertinent History  Patient is a 58 year old male presenting post nerve block C2,3,4,5 02/16/18 following years of cervical pain from cervical facet joint syndrome. Patient reports the nerve block significantly helped his pain for a couple weeks, but his pain has returned. Patient reports his pain used to be very sharp and debilitating, but since nerve block is a dull, constant pain that is worse with "bad weather". Patient reports the nerve block decreased his pain 40%. Worst pain in the past week: 8/10 best: 3/10. Patient reports pain "where his R shoulder connects to his neck and runs up the neck occasionally all the way up to the back of his head". Reports pain is a dull, muscle cramp, ache, with occasional muscle spasm of R UT. Denies any L sided cervical/shoulder pain. Patient  denies tingling, burning, loss of sensation. 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 Pt denies N/V, unexplained weight fluctuation, B&B changes, saddle paresthesia, fever, night sweats, or unrelenting night pain at this time.    Limitations  House hold activities;Lifting    How long can you sit comfortably?  2 hours    How long can you stand comfortably?  unlimited    How long can you walk comfortably?  unlmited    Diagnostic tests  Xray and MRI    Patient Stated Goals  Decrease pain! increase mobility    Pain Onset  More than a month ago       Manual - Manual cervical traction 10sec traction 10sec relax x10 - STM withtrigger point releasetoB/LUT,and periscapular musculature. Following: Dry Needling: (4) 8mm .60needles placed along the b/lto decrease increased muscular spasms and trigger points with the patient positioned in prone utilizing pincer grasp   Ther-Ex -Lat pull downs 3x 10 35# with min cuing for set up with good carry over following - Seated rows 35# 3x 10 with min TC initially for full scapular retraction with good carry over following  - Farmers carry 2 10# DB 4x 17ft with max cuing  initially for proper form with scapular activation and neutral shoulder height                       PT Education - 05/12/18 0939    Education Details  exercise form    Person(s) Educated  Patient    Methods  Explanation;Demonstration;Tactile cues;Verbal cues    Comprehension  Verbalized understanding;Returned demonstration;Tactile cues required;Verbal cues required       PT Short Term Goals - 04/16/18 1717      PT SHORT TERM GOAL #1   Title  Pt will be independent with HEP in order to improve strength and decrease cervical pain in order to improve pain-free function at home and work.       Time  4     Period  Weeks    Status  New        PT Long Term Goals - 04/16/18 1717      PT LONG TERM GOAL #1   Title  Patient will increase FOTO score to 67 to demonstrate predicted increase in functional mobility to complete ADLs    Baseline  04/16/18 58    Time  8    Period  Weeks    Status  New      PT LONG TERM GOAL #2   Title  Pt will decrease worst neck pain as reported on NPRS by at least 2 points in order to demonstrate clinically significant reduction in back pain    Baseline  04/16/18 worst pain 8/10 best 3/10    Time  8    Period  Weeks    Status  New      PT LONG TERM GOAL #3   Title  Pt will increase strength UT in Y position of by at least 1/2 MMT grade in order to demonstrate improvement in strength and function.    Baseline  04/16/17 R 2+/5 L 3+/5    Time  8    Period  Weeks    Status  New      PT LONG TERM GOAL #4   Title  Patient will demonstrate active cervical rotation and lateral bending wnl bilat to increase safety with driving and ADLs    Baseline  04/16/17 see eval    Time  8    Period  Weeks    Status  New            Plan - 05/12/18 1142    Clinical Impression Statement  Patient arrives 12 min late, session shortened accordingly. Patient with decreased muscle tension this session, with only b/l UT with remaining trigger points. Pt continues to respond well to manual and TDN with good postural carry over through therex following. PT continued progression to more functional/compound movements, which patient is able to complete with accuracy following PT cuing with decent carry over between sessions.     Rehab Potential  Good    Clinical Impairments Affecting Rehab Potential  (+) motivation, fairly young age, social support (-) chronicity of symptoms, other comorbidities (chronic pain), sedentary lifestyle    PT Frequency  2x / week    PT Duration  8 weeks    PT Treatment/Interventions  Taping;Passive range of motion;Dry needling;Manual techniques;Patient/family  education;Neuromuscular re-education;Therapeutic exercise;Functional mobility training;Therapeutic activities;DME Instruction;Ultrasound;Traction;Moist Heat;Electrical Stimulation;Aquatic Therapy;Cryotherapy;ADLs/Self Care Home Management    PT Next Visit Plan  decrease soft tissue restrictions, postural re-training    PT Home Exercise Plan  levator stretch, UT stretch, scap retractions,  ergonomic/postural carry over    Consulted and Agree with Plan of Care  Patient       Patient will benefit from skilled therapeutic intervention in order to improve the following deficits and impairments:  Decreased activity tolerance, Decreased endurance, Decreased range of motion, Decreased strength, Increased fascial restricitons, Impaired UE functional use, Improper body mechanics, Pain, Postural dysfunction, Impaired tone, Impaired flexibility, Increased muscle spasms, Hypomobility, Decreased mobility  Visit Diagnosis: Cervicalgia     Problem List Patient Active Problem List   Diagnosis Date Noted  . Cervical facet joint syndrome (C2,3,4,5) 02/05/2018  . Arthropathy of cervical facet joint 02/05/2018  . Cervicalgia 02/05/2018  . Chronic pain syndrome 02/05/2018  . Hypertension   . Cellulitis and abscess of right thigh s/p I&D 09/24/2017 09/24/2017  . Asthma, chronic 08/04/2013   Shelton Silvas PT, DPT Shelton Silvas 05/12/2018, 11:56 AM  [facility] [address] [place], [address] Phone: [phone]   Fax:  [phone]  Name: ROGERICK BALDWIN MRN: [id number] Date of Birth: 06-22-1960

## 2018-05-14 ENCOUNTER — Encounter: Payer: Self-pay | Admitting: Physical Therapy

## 2018-05-14 ENCOUNTER — Ambulatory Visit: Payer: BLUE CROSS/BLUE SHIELD | Admitting: Physical Therapy

## 2018-05-14 DIAGNOSIS — M542 Cervicalgia: Secondary | ICD-10-CM | POA: Diagnosis not present

## 2018-05-14 NOTE — Therapy (Signed)
[facility] [address] [place], [address] Phone: [phone]   Fax:  [phone]  Physical Therapy Treatment  Patient Details  Name: Tony Benson MRN: [id number] Date of Birth: 1960/07/04 Referring Provider (PT): Lateef   Encounter Date: 05/14/2018  PT End of Session - 05/14/18 0905    Visit Number  8    Number of Visits  17    Date for PT Re-Evaluation  06/11/18    PT Start Time  0900    PT Stop Time  0945    PT Time Calculation (min)  45 min    Activity Tolerance  Patient tolerated treatment well    Behavior During Therapy  [facility] for tasks assessed/performed       Past Medical History:  Diagnosis Date  . Asthma   . Hypertension     Past Surgical History:  Procedure Laterality Date  . CHOLECYSTECTOMY    . LUNG SURGERY     "ligation of thoracic duct"    There were no vitals filed for this visit.  Subjective Assessment - 05/14/18 0903    Subjective  Patient reports he is having some tension on the R UT but that it is very tolerable and "nothing like it was". Patient reports he is having some soreness "between the shoulder blades" which he attributes to exercise. Patient reports he does have increased pain at RUT following working a full day. Patient reports he feels 80% better, which he is very pleased with but reports he feels as though he has plateau'd as he has changed his diet, exercised more, been focused on his posture, and attempted all pain relief techniques- would like to explore more evasive options.     Pertinent History  Patient is a 58 year old male presenting post nerve block C2,3,4,5 02/16/18 following years of cervical pain from cervical facet joint syndrome. Patient reports the nerve block significantly helped his pain for a couple weeks, but his pain has returned. Patient reports his pain used to be very sharp and debilitating, but since nerve block is a dull, constant pain that is  worse with "bad weather". Patient reports the nerve block decreased his pain 40%. Worst pain in the past week: 8/10 best: 3/10. Patient reports pain "where his R shoulder connects to his neck and runs up the neck occasionally all the way up to the back of his head". Reports pain is a dull, muscle cramp, ache, with occasional muscle spasm of R UT. Denies any L sided cervical/shoulder pain. Patient denies tingling, burning, loss of sensation. 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 Pt denies N/V, unexplained weight fluctuation, B&B changes, saddle paresthesia, fever, night sweats, or unrelenting night pain at this time.    Limitations  House hold activities;Lifting    How long can you sit comfortably?  2 hours    How long can you stand comfortably?  unlimited    How long can you walk comfortably?  unlmited    Diagnostic tests  Xray and MRI    Patient Stated Goals  Decrease pain! increase mobility    Pain Onset  More than a month ago         Manual - Manual cervical traction 10sec traction 10sec relax x10 - STM withtrigger point releasetoB/LUT,and periscapular musculature. Following: Dry Needling: (  4) 94mm .60needles placed along theb/lto decrease increased muscular spasms and trigger points with the patient positioned in prone utilizing pincer grasp   Ther-Ex -Seated rows 35# 3x 10 with min VC for scapular retraction with good carry over from previous sessions - High rows 20# 3x 10 with min TC initially for set up with good carry over following - Scaption with 4# DB 3x 10 with min cuing for set up for scapular positioning with good carry over  - Prone T and I 3x 10 in [facility] position with TC initially for proper form with good carry over following                       PT Education - 05/14/18 0918     Education Details  Exercise form    Person(s) Educated  Patient    Methods  Explanation;Demonstration;Verbal cues    Comprehension  Verbalized understanding;Returned demonstration;Verbal cues required       PT Short Term Goals - 04/16/18 1717      PT SHORT TERM GOAL #1   Title  Pt will be independent with HEP in order to improve strength and decrease cervical pain in order to improve pain-free function at home and work.       Time  4    Period  Weeks    Status  New        PT Long Term Goals - 05/14/18 0908      PT LONG TERM GOAL #1   Title  Patient will increase FOTO score to 67 to demonstrate predicted increase in functional mobility to complete ADLs    Baseline  04/16/18 58    Time  8    Period  Weeks    Status  New      PT LONG TERM GOAL #2   Title  Pt will decrease worst neck pain as reported on NPRS by at least 2 points in order to demonstrate clinically significant reduction in back pain    Baseline  04/16/18 worst pain 8/10 best 3/10    Time  8    Period  Weeks    Status  New      PT LONG TERM GOAL #3   Title  Pt will increase strength UT in Y position of by at least 1/2 MMT grade in order to demonstrate improvement in strength and function.    Baseline  04/16/17 R 2+/5 L 3+/5    Time  8    Period  Weeks    Status  New      PT LONG TERM GOAL #4   Title  Patient will demonstrate active cervical rotation and lateral bending wnl bilat to increase safety with driving and ADLs    Baseline  04/16/17 see eval    Time  8    Period  Weeks    Status  New            Plan - 05/14/18 8315    Clinical Impression Statement  Patient is continuing to report progress with manual + TDN techniques, allowing for increased therex progression for postural muscle strengthening. Patient reports he feels 80% better, but despite best efforts, feels as though his R sided neck/shoulder pain returns at the end of his work days. PT an patient discussed possible DC to HEP should he continue to  demonstrate good form/carry over of therex, with possible next step interventions depending on MD assessment.     Rehab Potential  Good    Clinical Impairments Affecting Rehab Potential  (+) motivation, fairly young age, social support (-) chronicity of symptoms, other comorbidities (chronic pain), sedentary lifestyle    PT Frequency  2x / week    PT Duration  8 weeks    PT Treatment/Interventions  Taping;Passive range of motion;Dry needling;Manual techniques;Patient/family education;Neuromuscular re-education;Therapeutic exercise;Functional mobility training;Therapeutic activities;DME Instruction;Ultrasound;Traction;Moist Heat;Electrical Stimulation;Aquatic Therapy;Cryotherapy;ADLs/Self Care Home Management    PT Next Visit Plan  decrease soft tissue restrictions, postural re-training    PT Home Exercise Plan  levator stretch, UT stretch, scap retractions, ergonomic/postural carry over    Consulted and Agree with Plan of Care  Patient       Patient will benefit from skilled therapeutic intervention in order to improve the following deficits and impairments:  Decreased activity tolerance, Decreased endurance, Decreased range of motion, Decreased strength, Increased fascial restricitons, Impaired UE functional use, Improper body mechanics, Pain, Postural dysfunction, Impaired tone, Impaired flexibility, Increased muscle spasms, Hypomobility, Decreased mobility  Visit Diagnosis: Cervicalgia     Problem List Patient Active Problem List   Diagnosis Date Noted  . Cervical facet joint syndrome (C2,3,4,5) 02/05/2018  . Arthropathy of cervical facet joint 02/05/2018  . Cervicalgia 02/05/2018  . Chronic pain syndrome 02/05/2018  . Hypertension   . Cellulitis and abscess of right thigh s/p I&D 09/24/2017 09/24/2017  . Asthma, chronic 08/04/2013   Shelton Silvas PT, DPT Shelton Silvas 05/14/2018, 9:54 AM  [facility] [address] [place], [address] Phone: [phone]   Fax:  [phone]  Name: NASEER HEARN MRN: [id number] Date of Birth: 12/01/1960

## 2018-05-18 ENCOUNTER — Ambulatory Visit: Payer: BLUE CROSS/BLUE SHIELD | Attending: Nurse Practitioner | Admitting: Physical Therapy

## 2018-05-18 DIAGNOSIS — M542 Cervicalgia: Secondary | ICD-10-CM | POA: Insufficient documentation

## 2018-05-20 ENCOUNTER — Encounter: Payer: Self-pay | Admitting: Physical Therapy

## 2018-05-20 ENCOUNTER — Ambulatory Visit: Payer: BLUE CROSS/BLUE SHIELD | Admitting: Physical Therapy

## 2018-05-20 DIAGNOSIS — M542 Cervicalgia: Secondary | ICD-10-CM

## 2018-05-20 NOTE — Therapy (Signed)
[facility] [address], [place], [address] Phone: [phone]   Fax:  [phone]  Physical Therapy Treatment/ Discharge Summary  Patient Details  Name: Tony Benson MRN: [id number] Date of Birth: Jan 09, 1961 Referring Provider (PT): Lateef   Encounter Date: 05/20/2018  PT End of Session - 05/20/18 0917    Visit Number  9    Number of Visits  17    Date for PT Re-Evaluation  06/11/18    PT Start Time  0900    PT Stop Time  0945    PT Time Calculation (min)  45 min    Activity Tolerance  Patient tolerated treatment well    Behavior During Therapy  [facility] for tasks assessed/performed       Past Medical History:  Diagnosis Date  . Asthma   . Hypertension     Past Surgical History:  Procedure Laterality Date  . CHOLECYSTECTOMY    . LUNG SURGERY     "ligation of thoracic duct"    There were no vitals filed for this visit.  Subjective Assessment - 05/20/18 0907    Subjective  Patient reports his pain has improved since beginning PT and reports feeling 90% better, with some remaining pain at b/l UT R>L, and is beginning to plateau with remaining pain/progress. Reports baseline 5/10 pain remaining. Compliance with HEP    Pertinent History  Patient is a 58 year old male presenting post nerve block C2,3,4,5 02/16/18 following years of cervical pain from cervical facet joint syndrome. Patient reports the nerve block significantly helped his pain for a couple weeks, but his pain has returned. Patient reports his pain used to be very sharp and debilitating, but since nerve block is a dull, constant pain that is worse with "bad weather". Patient reports the nerve block decreased his pain 40%. Worst pain in the past week: 8/10 best: 3/10. Patient reports pain "where his R shoulder connects to his neck and runs up the neck occasionally all the way up to the back of his head". Reports pain is a dull, muscle cramp, ache,  with occasional muscle spasm of R UT. Denies any L sided cervical/shoulder pain. Patient denies tingling, burning, loss of sensation. 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 Pt denies N/V, unexplained weight fluctuation, B&B changes, saddle paresthesia, fever, night sweats, or unrelenting night pain at this time.    Limitations  House hold activities;Lifting    How long can you sit comfortably?  2 hours    How long can you stand comfortably?  unlimited    How long can you walk comfortably?  unlmited    Diagnostic tests  Xray and MRI    Patient Stated Goals  Decrease pain! increase mobility    Pain Onset  More than a month ago        Manual - Manual cervical traction 10sec traction 10sec relax x10 - STM withtrigger point releasetoB/LUT,and periscapular musculature. Following: Dry Needling: (4) 34m .60needles placed along theb/lto decrease increased muscular spasms and trigger points with the patient positioned in prone utilizing pincer grasp  Ther-Ex - Review of stretching HEP with education on parameters. Demonstration of 1 set of 10 of the following with education on parameters with blue and black tband given with education on how and when to progress Prone  Scapular Retraction Y - 10 reps - 3 sets - 1x daily - 3-4x weekly  Prone Scapular Retraction Y - 10 reps - 3 sets - 1x daily - 3-4x weekly  Prone Shoulder Horizontal Abduction with Thumbs Up - 10 reps - 3 sets - 1x daily - 3-4x weekly  Prone Shoulder Extension - 10 reps - 3 sets - 1x daily - 3-4x weekly  Standing Shoulder Row with Anchored Resistance - 10 reps - 3 sets - 1x daily - 3-4x weekly  Standing High Row with Resistance - 10 reps - 3 sets - 1x daily - 3-4x weekly  Scaption with Resistance - 10 reps - 3 sets - 1x daily - 3-4x weekly                             PT Education - 05/20/18 0917    Education Details  Exercise form, HEP and DC recommendations    Person(s) Educated  Patient    Methods  Explanation;Demonstration;Verbal cues;Handout    Comprehension  Verbalized understanding;Returned demonstration;Verbal cues required       PT Short Term Goals - 05/20/18 0908      PT SHORT TERM GOAL #1   Title  Pt will be independent with HEP in order to improve strength and decrease cervical pain in order to improve pain-free function at home and work.       Time  4    Period  Weeks    Status  Achieved        PT Long Term Goals - 05/20/18 0909      PT LONG TERM GOAL #1   Title  Patient will increase FOTO score to 67 to demonstrate predicted increase in functional mobility to complete ADLs    Baseline  05/20/18 73    Time  8    Period  Weeks    Status  Achieved      PT LONG TERM GOAL #2   Title  Pt will decrease worst neck pain as reported on NPRS by at least 2 points in order to demonstrate clinically significant reduction in back pain    Baseline  05/20/18 between 3-5/10     Time  8    Period  Weeks    Status  Achieved      PT LONG TERM GOAL #3   Title  Pt will increase strength UT in Y position of by at least 1/2 MMT grade in order to demonstrate improvement in strength and function.    Baseline  05/20/18 R 3+/5 L 4-/5    Time  8    Period  Weeks    Status  Achieved      PT LONG TERM GOAL #4   Title  Patient will demonstrate active cervical rotation and lateral bending wnl bilat to increase safety with driving and ADLs    Baseline  05/20/18 lateral bending: 40d bilat without pain Rotation: 76d b/l without pain     Time  8    Period  Weeks    Status  Partially Met            Plan - 05/20/18 3419    Clinical Impression Statement  PT re-assessed patient's goals this session, with noted progress in ROM and decreased pain, improving patient's ability to complete ADLs, work . Patient  has some remaining pain that is difficult to resolve and patient is beginning to plateau with progress with min pain  remaining, inhibiting complete pain resolution to complete ADLs without pain. Patient would like to return to referring doctor to address possible more invasive information, and PT agrees this may be necessary at this point to resolve remaining pain. PT reviewed all HEP recommendations with education on parameters for maintaining muscle lengthening and postural strengthening. Patient demonstrated and verbalized understanding of all therex with accuracy.     Clinical Presentation  Evolving    Clinical Decision Making  Moderate    Rehab Potential  Good    Clinical Impairments Affecting Rehab Potential  (+) motivation, fairly young age, social support (-) chronicity of symptoms, other comorbidities (chronic pain), sedentary lifestyle    PT Frequency  2x / week    PT Duration  8 weeks    PT Treatment/Interventions  Taping;Passive range of motion;Dry needling;Manual techniques;Patient/family education;Neuromuscular re-education;Therapeutic exercise;Functional mobility training;Therapeutic activities;DME Instruction;Ultrasound;Traction;Moist Heat;Electrical Stimulation;Aquatic Therapy;Cryotherapy;ADLs/Self Care Home Management    PT Next Visit Plan  decrease soft tissue restrictions, postural re-training    PT Home Exercise Plan  levator stretch, UT stretch, scap retractions, ergonomic/postural carry over    Consulted and Agree with Plan of Care  Patient       Patient will benefit from skilled therapeutic intervention in order to improve the following deficits and impairments:  Decreased activity tolerance, Decreased endurance, Decreased range of motion, Decreased strength, Increased fascial restricitons, Impaired UE functional use, Improper body mechanics, Pain, Postural dysfunction, Impaired tone, Impaired flexibility, Increased muscle spasms, Hypomobility, Decreased mobility  Visit  Diagnosis: Cervicalgia     Problem List Patient Active Problem List   Diagnosis Date Noted  . Cervical facet joint syndrome (C2,3,4,5) 02/05/2018  . Arthropathy of cervical facet joint 02/05/2018  . Cervicalgia 02/05/2018  . Chronic pain syndrome 02/05/2018  . Hypertension   . Cellulitis and abscess of right thigh s/p I&D 09/24/2017 09/24/2017  . Asthma, chronic 08/04/2013   Shelton Silvas PT, DPT Shelton Silvas 05/20/2018, 9:46 AM  [facility] [address] [place], [address] Phone: [phone]   Fax:  [phone]  Name: Tony Benson MRN: [id number] Date of Birth: 10/29/1960

## 2018-05-25 ENCOUNTER — Ambulatory Visit: Payer: BLUE CROSS/BLUE SHIELD | Admitting: Physical Therapy

## 2018-05-29 ENCOUNTER — Ambulatory Visit: Payer: BLUE CROSS/BLUE SHIELD | Admitting: Physical Therapy

## 2018-07-09 ENCOUNTER — Telehealth: Payer: Self-pay

## 2018-07-09 ENCOUNTER — Other Ambulatory Visit: Payer: Self-pay

## 2018-07-09 DIAGNOSIS — Z1211 Encounter for screening for malignant neoplasm of colon: Secondary | ICD-10-CM

## 2018-07-09 NOTE — Telephone Encounter (Signed)
Gastroenterology Pre-Procedure Review  Request Date: 09/21/18 Requesting Physician: Dr. Vicente Males  PATIENT REVIEW QUESTIONS: The patient responded to the following health history questions as indicated:    1. Are you having any GI issues? no 2. Do you have a personal history of Polyps? no 3. Do you have a family history of Colon Cancer or Polyps? yes (mom polyps) 4. Diabetes Mellitus? no 5. Joint replacements in the past 12 months?no 6. Major health problems in the past 3 months?no 7. Any artificial heart valves, MVP, or defibrillator?no    MEDICATIONS & ALLERGIES:    Patient reports the following regarding taking any anticoagulation/antiplatelet therapy:   Plavix, Coumadin, Eliquis, Xarelto, Lovenox, Pradaxa, Brilinta, or Effient? no Aspirin? no  Patient confirms/reports the following medications:  Current Outpatient Medications  Medication Sig Dispense Refill  . amLODipine (NORVASC) 10 MG tablet Take 10 mg by mouth daily.  1  . celecoxib (CELEBREX) 200 MG capsule Take 200 mg by mouth 2 (two) times daily.    Marland Kitchen gabapentin (NEURONTIN) 300 MG capsule 1 CAPSULE EVERY 12 HOURS  1  . glipiZIDE (GLUCOTROL) 5 MG tablet Take 5 mg by mouth 2 (two) times daily before a meal.    . losartan (COZAAR) 100 MG tablet Take 100 mg by mouth daily.  1  . metFORMIN (GLUCOPHAGE) 500 MG tablet Take 1,000 mg by mouth 2 (two) times daily with a meal.     . metoprolol tartrate (LOPRESSOR) 25 MG tablet Take 25 mg by mouth 2 (two) times daily.    . Omega-3 Fatty Acids (FISH OIL PO) Take 1 capsule by mouth daily.    . Vitamin D, Ergocalciferol, (DRISDOL) 50000 units CAPS capsule Take 50,000 Units by mouth every 7 (seven) days.    . vitamin E 100 UNIT capsule Take 100 Units by mouth daily.     No current facility-administered medications for this visit.     Patient confirms/reports the following allergies:  Allergies  Allergen Reactions  . Other Anaphylaxis    peanuts  . Morphine And Related Itching    No  orders of the defined types were placed in this encounter.   AUTHORIZATION INFORMATION Primary Insurance: 1D#: Group #:  Secondary Insurance: 1D#: Group #:  SCHEDULE INFORMATION: Date: 09/21/18 Time: Location:[facility]

## 2018-09-14 ENCOUNTER — Telehealth: Payer: Self-pay

## 2018-09-14 NOTE — Telephone Encounter (Signed)
Patients procedure has been rescheduled from 09/21/18 to 10/27/18 due to limited scheduling availability related to [place].  Thanks [employer]

## 2018-09-16 ENCOUNTER — Other Ambulatory Visit: Payer: BLUE CROSS/BLUE SHIELD

## 2018-10-23 ENCOUNTER — Other Ambulatory Visit: Admission: RE | Admit: 2018-10-23 | Payer: BC Managed Care – PPO | Source: Ambulatory Visit

## 2018-11-13 ENCOUNTER — Other Ambulatory Visit
Admission: RE | Admit: 2018-11-13 | Discharge: 2018-11-13 | Disposition: A | Discharge status: Home / Self Care | Payer: BC Managed Care – PPO | Source: Ambulatory Visit | Attending: Gastroenterology | Admitting: Gastroenterology

## 2018-11-13 ENCOUNTER — Other Ambulatory Visit: Payer: Self-pay

## 2018-11-13 DIAGNOSIS — Z01812 Encounter for preprocedural laboratory examination: Secondary | ICD-10-CM | POA: Insufficient documentation

## 2018-11-13 DIAGNOSIS — Z20828 Contact with and (suspected) exposure to other viral communicable diseases: Secondary | ICD-10-CM | POA: Insufficient documentation

## 2018-11-13 LAB — SARS CORONAVIRUS 2 (TAT 6-24 HRS): SARS Coronavirus 2: NEGATIVE

## 2018-11-17 ENCOUNTER — Other Ambulatory Visit: Payer: Self-pay

## 2018-11-17 MED ORDER — NA SULFATE-K SULFATE-MG SULF 17.5-3.13-1.6 GM/177ML PO SOLN: 1.0000 | Freq: Once | ORAL | 0 refills | Status: AC

## 2018-11-18 ENCOUNTER — Encounter
Admission: RE | Disposition: A | Discharge status: Home / Self Care | Payer: Self-pay | Source: Home / Self Care | Attending: Gastroenterology

## 2018-11-18 ENCOUNTER — Encounter: Payer: Self-pay | Admitting: Emergency Medicine

## 2018-11-18 ENCOUNTER — Ambulatory Visit: Payer: BC Managed Care – PPO | Admitting: Anesthesiology

## 2018-11-18 ENCOUNTER — Other Ambulatory Visit: Payer: Self-pay

## 2018-11-18 ENCOUNTER — Ambulatory Visit
Admission: RE | Admit: 2018-11-18 | Discharge: 2018-11-18 | Disposition: A | Discharge status: Home / Self Care | Payer: BC Managed Care – PPO | Attending: Gastroenterology | Admitting: Gastroenterology

## 2018-11-18 DIAGNOSIS — Z79899 Other long term (current) drug therapy: Secondary | ICD-10-CM | POA: Diagnosis not present

## 2018-11-18 DIAGNOSIS — Z7984 Long term (current) use of oral hypoglycemic drugs: Secondary | ICD-10-CM | POA: Insufficient documentation

## 2018-11-18 DIAGNOSIS — Z791 Long term (current) use of non-steroidal anti-inflammatories (NSAID): Secondary | ICD-10-CM | POA: Diagnosis not present

## 2018-11-18 DIAGNOSIS — K5730 Diverticulosis of large intestine without perforation or abscess without bleeding: Secondary | ICD-10-CM

## 2018-11-18 DIAGNOSIS — I10 Essential (primary) hypertension: Secondary | ICD-10-CM | POA: Insufficient documentation

## 2018-11-18 DIAGNOSIS — D128 Benign neoplasm of rectum: Secondary | ICD-10-CM | POA: Diagnosis not present

## 2018-11-18 DIAGNOSIS — K621 Rectal polyp: Secondary | ICD-10-CM | POA: Diagnosis not present

## 2018-11-18 DIAGNOSIS — Z1211 Encounter for screening for malignant neoplasm of colon: Secondary | ICD-10-CM

## 2018-11-18 DIAGNOSIS — Z800 Family history of malignant neoplasm of digestive organs: Secondary | ICD-10-CM

## 2018-11-18 DIAGNOSIS — E119 Type 2 diabetes mellitus without complications: Secondary | ICD-10-CM | POA: Insufficient documentation

## 2018-11-18 DIAGNOSIS — J45909 Unspecified asthma, uncomplicated: Secondary | ICD-10-CM | POA: Insufficient documentation

## 2018-11-18 DIAGNOSIS — K635 Polyp of colon: Secondary | ICD-10-CM

## 2018-11-18 DIAGNOSIS — D123 Benign neoplasm of transverse colon: Secondary | ICD-10-CM | POA: Diagnosis not present

## 2018-11-18 HISTORY — DX: Type 2 diabetes mellitus without complications: E11.9

## 2018-11-18 HISTORY — PX: COLONOSCOPY WITH PROPOFOL: SHX5780

## 2018-11-18 LAB — GLUCOSE, CAPILLARY: Glucose-Capillary: 104 mg/dL — ABNORMAL HIGH (ref 70–99)

## 2018-11-18 SURGERY — COLONOSCOPY WITH PROPOFOL
Anesthesia: General

## 2018-11-18 MED ORDER — PROPOFOL 10 MG/ML IV BOLUS
INTRAVENOUS | Status: DC | PRN
  Administered 2018-11-18 (×2): 20 mg via INTRAVENOUS

## 2018-11-18 MED ORDER — SODIUM CHLORIDE 0.9 % IV SOLN
INTRAVENOUS | Status: DC
  Administered 2018-11-18: 11:00:00 via INTRAVENOUS
  Administered 2018-11-18: 1000 mL via INTRAVENOUS

## 2018-11-18 MED ORDER — PROPOFOL 500 MG/50ML IV EMUL
INTRAVENOUS | Status: DC | PRN
  Administered 2018-11-18: 150 ug/kg/min via INTRAVENOUS

## 2018-11-18 NOTE — H&P (Addendum)
Tony Antigua, MD [address], [place], [place], [place], [address] [address], [place], [place], [place], [address] Phone: [phone]  Fax: [phone]  Primary Care Physician:  Casilda Carls, MD   Pre-Procedure History & Physical: HPI:  Tony Benson is a 58 y.o. male is here for a colonoscopy.   Past Medical History:  Diagnosis Date  . Asthma   . Diabetes mellitus without complication ([place])   . Hypertension     Past Surgical History:  Procedure Laterality Date  . CHOLECYSTECTOMY    . LUNG SURGERY     "ligation of thoracic duct"    Prior to Admission medications   Medication Sig Start Date End Date Taking? Authorizing Provider  amLODipine (NORVASC) 10 MG tablet Take 10 mg by mouth daily. 09/10/17  Yes [provider]  celecoxib (CELEBREX) 200 MG capsule Take 200 mg by mouth 2 (two) times daily.   Yes [provider]  gabapentin (NEURONTIN) 300 MG capsule 1 CAPSULE EVERY 12 HOURS 03/06/16  Yes [provider]  glipiZIDE (GLUCOTROL) 5 MG tablet Take 5 mg by mouth 2 (two) times daily before a meal.   Yes [provider]  losartan (COZAAR) 100 MG tablet Take 100 mg by mouth daily. 08/15/17  Yes [provider]  metFORMIN (GLUCOPHAGE) 500 MG tablet Take 1,000 mg by mouth 2 (two) times daily with a meal.    Yes [provider]  metoprolol tartrate (LOPRESSOR) 25 MG tablet Take 25 mg by mouth 2 (two) times daily.   Yes [provider]  Omega-3 Fatty Acids (FISH OIL PO) Take 1 capsule by mouth daily.   Yes [provider]  Vitamin D, Ergocalciferol, (DRISDOL) 50000 units CAPS capsule Take 50,000 Units by mouth every 7 (seven) days.   Yes [provider]  vitamin E 100 UNIT capsule Take 100 Units by mouth daily.   Yes [provider]    Allergies as of 07/13/2018 - Review Complete 05/20/2018  Allergen Reaction Noted  . Other Anaphylaxis 09/24/2017  . Morphine and related Itching  08/04/2013    Family History  Problem Relation Age of Onset  . Allergies Sister     Social History   Socioeconomic History  . Marital status: Married    Spouse name: Not on file  . Number of children: Not on file  . Years of education: Not on file  . Highest education level: Not on file  Occupational History  . Occupation: [employer]  . Financial resource strain: Not on file  . Food insecurity    Worry: Not on file    Inability: Not on file  . Transportation needs    Medical: Not on file    Non-medical: Not on file  Tobacco Use  . Smoking status: Never Smoker  . Smokeless tobacco: Never Used  Substance and Sexual Activity  . Alcohol use: No  . Drug use: No  . Sexual activity: Not on file  Lifestyle  . Physical activity    Days per week: Not on file    Minutes per session: Not on file  . Stress: Not on file  Relationships  . Social Herbalist on phone: Not on file    Gets together: Not on file    Attends religious service: Not on file    Active member of club or organization: Not on file    Attends meetings of clubs or organizations: Not on file  Relationship status: Not on file  . Intimate partner violence    Fear of current or ex partner: Not on file    Emotionally abused: Not on file    Physically abused: Not on file    Forced sexual activity: Not on file  Other Topics Concern  . Not on file  Social History Narrative  . Not on file    Review of Systems: See HPI, otherwise negative ROS  Physical Exam: BP (!) 161/115   Pulse 94   Temp (!) 96.6 F (35.9 C) (Tympanic)   Resp 18   Ht 5\' 10"  (1.778 m)   Wt 99.8 kg   SpO2 99%   BMI 31.57 kg/m  General:   Alert,  pleasant and cooperative in NAD Head:  Normocephalic and atraumatic. Neck:  Supple; no masses or thyromegaly. Lungs:  Clear throughout to auscultation, normal respiratory effort.    Heart:  +S1, +S2, Regular rate and rhythm, No edema. Abdomen:  Soft,  nontender and nondistended. Normal bowel sounds, without guarding, and without rebound.   Neurologic:  Alert and  oriented x4;  grossly normal neurologically.  Impression/Plan: Tony Benson is here for a colonoscopy to be performed for high risk screening. Mother diagnosed with colon cancer at around 58 years of age as per pt.  Risks, benefits, limitations, and alternatives regarding  colonoscopy have been reviewed with the patient.  Questions have been answered.  All parties agreeable.   Virgel Manifold, MD  11/18/2018, 11:07 AM

## 2018-11-18 NOTE — Anesthesia Post-op Follow-up Note (Signed)
Anesthesia QCDR form completed.        

## 2018-11-18 NOTE — Anesthesia Preprocedure Evaluation (Addendum)
Anesthesia Evaluation  Patient identified by MRN, date of birth, ID band Patient awake    Reviewed: Allergy & Precautions, NPO status , Patient's Chart, lab work & pertinent test results, reviewed documented beta blocker date and time   History of Anesthesia Complications Negative for: history of anesthetic complications  Airway Mallampati: III       Dental   Pulmonary asthma , sleep apnea (not using CPAP) , neg COPD,           Cardiovascular hypertension, Pt. on medications and Pt. on home beta blockers (-) Past MI and (-) CHF (-) dysrhythmias (-) Valvular Problems/Murmurs     Neuro/Psych neg Seizures    GI/Hepatic Neg liver ROS, neg GERD  ,  Endo/Other  diabetes, Type 2, Oral Hypoglycemic Agents  Renal/GU negative Renal ROS     Musculoskeletal   Abdominal   Peds  Hematology   Anesthesia Other Findings   Reproductive/Obstetrics                            Anesthesia Physical Anesthesia Plan  ASA: III  Anesthesia Plan: General   Post-op Pain Management:    Induction: Intravenous  PONV Risk Score and Plan: 2 and Propofol infusion and TIVA  Airway Management Planned: Nasal Cannula  Additional Equipment:   Intra-op Plan:   Post-operative Plan:   Informed Consent: I have reviewed the patients History and Physical, chart, labs and discussed the procedure including the risks, benefits and alternatives for the proposed anesthesia with the patient or authorized representative who has indicated his/her understanding and acceptance.       Plan Discussed with:   Anesthesia Plan Comments:         Anesthesia Quick Evaluation

## 2018-11-18 NOTE — Transfer of Care (Signed)
Immediate Anesthesia Transfer of Care Note  Patient: Tony Benson  Procedure(s) Performed: COLONOSCOPY WITH PROPOFOL (N/A )  Patient Location: PACU and Endoscopy Unit  Anesthesia Type:MAC  Level of Consciousness: awake  Airway & Oxygen Therapy: Patient Spontanous Breathing  Post-op Assessment: Report given to RN  Post vital signs: Reviewed  Last Vitals:  Vitals Value Taken Time  BP 157/116 11/18/18 1213  Temp 36.3 C 11/18/18 1152  Pulse 78 11/18/18 1213  Resp 22 11/18/18 1213  SpO2 98 % 11/18/18 1213  Vitals shown include unvalidated device data.  Last Pain:  Vitals:   11/18/18 1212  TempSrc:   PainSc: 0-No pain         Complications: No apparent anesthesia complications

## 2018-11-18 NOTE — Anesthesia Postprocedure Evaluation (Signed)
Anesthesia Post Note  Patient: Tony Benson  Procedure(s) Performed: COLONOSCOPY WITH PROPOFOL (N/A )  Patient location during evaluation: Endoscopy Anesthesia Type: General Level of consciousness: awake and alert Pain management: pain level controlled Vital Signs Assessment: post-procedure vital signs reviewed and stable Respiratory status: spontaneous breathing and respiratory function stable Cardiovascular status: stable Anesthetic complications: no     Last Vitals:  Vitals:   11/18/18 1152 11/18/18 1212  BP: 119/73 (!) 157/116  Pulse: 92   Resp: (!) 23   Temp: (!) 36.3 C   SpO2: 100%     Last Pain:  Vitals:   11/18/18 1212  TempSrc:   PainSc: 0-No pain                 Sly Parlee K

## 2018-11-18 NOTE — Op Note (Signed)
[facility] Gastroenterology Patient Name: Tony Benson Procedure Date: 11/18/2018 11:19 AM MRN: [id number] Account #: [id number] Date of Birth: 07-May-1960 Admit Type: Outpatient Age: 58 Room: [facility] ENDO ROOM 4 Gender: Male Note Status: Finalized Procedure:            Colonoscopy Indications:          Screening in patient at increased risk: Family history                        of 1st-degree relative with colorectal cancer before                        age 18 years Providers:            Nadean Montanaro B. Bonna Gains MD, MD Referring MD:         Casilda Carls, MD (Referring MD) Medicines:            Monitored Anesthesia Care Complications:        No immediate complications. Procedure:            Pre-Anesthesia Assessment:                       - ASA Grade Assessment: II - A patient with mild                        systemic disease.                       - Prior to the procedure, a History and Physical was                        performed, and patient medications, allergies and                        sensitivities were reviewed. The patient's tolerance of                        previous anesthesia was reviewed.                       - The risks and benefits of the procedure and the                        sedation options and risks were discussed with the                        patient. All questions were answered and informed                        consent was obtained.                       - Patient identification and proposed procedure were                        verified prior to the procedure by the physician, the                        nurse, the anesthesiologist, the anesthetist and the  technician. The procedure was verified in the procedure                        room.                       After obtaining informed consent, the colonoscope was                        passed under direct vision. Throughout the procedure,   the patient's blood pressure, pulse, and oxygen                        saturations were monitored continuously. The                        Colonoscope was introduced through the anus and                        advanced to the the cecum, identified by appendiceal                        orifice and ileocecal valve. The colonoscopy was                        performed with ease. The patient tolerated the                        procedure well. The quality of the bowel preparation                        was good. Findings:      The perianal and digital rectal examinations were normal.      Three sessile polyps were found in the rectum and transverse colon. The       polyps were 3 to 4 mm in size. These polyps were removed with a jumbo       cold forceps. Resection and retrieval were complete.      Multiple diverticula were found in the sigmoid colon.      The exam was otherwise without abnormality.      The rectum, sigmoid colon, descending colon, transverse colon, ascending       colon and cecum appeared normal.      No additional abnormalities were found on retroflexion. Impression:           - Three 3 to 4 mm polyps in the rectum and in the                        transverse colon, removed with a jumbo cold forceps.                        Resected and retrieved.                       - Diverticulosis in the sigmoid colon.                       - The examination was otherwise normal.                       - The rectum, sigmoid colon, descending colon,  transverse colon, ascending colon and cecum are normal. Recommendation:       - Discharge patient to home (with escort).                       - High fiber diet.                       - Advance diet as tolerated.                       - Continue present medications.                       - Await pathology results.                       - Repeat colonoscopy in 5 years.                       - The findings and  recommendations were discussed with                        the patient.                       - The findings and recommendations were discussed with                        the patient's family.                       - Return to primary care physician as previously                        scheduled. Procedure Code(s):    --- Professional ---                       [id number], Colonoscopy, flexible; with biopsy, single or                        multiple Diagnosis Code(s):    --- Professional ---                       Z80.0, Family history of malignant neoplasm of                        digestive organs                       K62.1, Rectal polyp                       K63.5, Polyp of colon                       K57.30, Diverticulosis of large intestine without                        perforation or abscess without bleeding CPT copyright 2019 American Medical Association. All rights reserved. The codes documented in this report are preliminary and upon coder review may  be revised to meet current compliance requirements.  [name], MD Margretta Sidle B. Bonna Gains MD, MD 11/18/2018 11:54:16 AM This report has been signed electronically. Number  of Addenda: 0 Note Initiated On: 11/18/2018 11:19 AM Scope Withdrawal Time: 0 hours 18 minutes 37 seconds  Total Procedure Duration: 0 hours 21 minutes 45 seconds  Estimated Blood Loss: Estimated blood loss: none.      [facility]

## 2018-11-19 ENCOUNTER — Encounter: Payer: Self-pay | Admitting: Gastroenterology

## 2018-11-19 LAB — SURGICAL PATHOLOGY

## 2018-11-23 ENCOUNTER — Telehealth: Payer: Self-pay | Admitting: Gastroenterology

## 2018-11-23 NOTE — Telephone Encounter (Signed)
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

## 2018-11-24 ENCOUNTER — Other Ambulatory Visit: Payer: Self-pay

## 2018-11-24 ENCOUNTER — Encounter: Payer: Self-pay | Admitting: Gastroenterology

## 2018-11-24 NOTE — Telephone Encounter (Signed)
Left vm for pt to return my call regarding getting a work note for being out for colonoscopy.

## 2018-11-26 NOTE — Telephone Encounter (Signed)
Pt called back and work note has been completed and emailed to pt.

## 2018-11-30 ENCOUNTER — Telehealth: Payer: Self-pay

## 2018-11-30 DIAGNOSIS — M47812 Spondylosis without myelopathy or radiculopathy, cervical region: Secondary | ICD-10-CM

## 2018-11-30 NOTE — Telephone Encounter (Signed)
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 He has finished PT. His last visit was December so We need a new order to schedule. His prior [name] is good until oct1st that [name] had approved in the past.

## 2018-12-01 NOTE — Telephone Encounter (Signed)
Patient has completed 2 cervical diagnostic cervical facet medial branch nerve blocks at C3, C4, C5 bilaterally on 01/07/2018 as well as 02/16/2018.  In the interim, patient has completed physical therapy.  She would like to pursue cervical facet radiofrequency ablation as these blocks provided him with significant pain relief in regards to his neck pain and shoulder pain and he would like to pursue long-term pain relief with the possibility of radiofrequency ablation.  Start with 1 side at a time.

## 2018-12-16 ENCOUNTER — Other Ambulatory Visit: Payer: Self-pay

## 2018-12-16 ENCOUNTER — Ambulatory Visit
Admission: RE | Admit: 2018-12-16 | Discharge: 2018-12-16 | Disposition: A | Discharge status: Home / Self Care | Payer: BC Managed Care – PPO | Source: Ambulatory Visit | Attending: Student in an Organized Health Care Education/Training Program | Admitting: Student in an Organized Health Care Education/Training Program

## 2018-12-16 ENCOUNTER — Ambulatory Visit (HOSPITAL_BASED_OUTPATIENT_CLINIC_OR_DEPARTMENT_OTHER): Payer: BC Managed Care – PPO | Admitting: Student in an Organized Health Care Education/Training Program

## 2018-12-16 ENCOUNTER — Encounter: Payer: Self-pay | Admitting: Student in an Organized Health Care Education/Training Program

## 2018-12-16 ENCOUNTER — Ambulatory Visit: Payer: BC Managed Care – PPO | Admitting: Student in an Organized Health Care Education/Training Program

## 2018-12-16 DIAGNOSIS — M47812 Spondylosis without myelopathy or radiculopathy, cervical region: Secondary | ICD-10-CM | POA: Insufficient documentation

## 2018-12-16 MED ORDER — LIDOCAINE HCL 2 % IJ SOLN
20.0000 mL | Freq: Once | INTRAMUSCULAR | Status: AC
  Administered 2018-12-16: 400 mg

## 2018-12-16 MED ORDER — DEXAMETHASONE SODIUM PHOSPHATE 10 MG/ML IJ SOLN
10.0000 mg | Freq: Once | INTRAMUSCULAR | Status: AC
  Administered 2018-12-16: 10 mg

## 2018-12-16 MED ORDER — FENTANYL CITRATE (PF) 100 MCG/2ML IJ SOLN
INTRAMUSCULAR | Status: AC
  Filled 2018-12-16: qty 2

## 2018-12-16 MED ORDER — HYDROCODONE-ACETAMINOPHEN 5-325 MG PO TABS: 1.0000 | ORAL_TABLET | Freq: Four times a day (QID) | ORAL | 0 refills | Status: DC | PRN

## 2018-12-16 MED ORDER — ROPIVACAINE HCL 2 MG/ML IJ SOLN
1.0000 mL | Freq: Once | INTRAMUSCULAR | Status: AC
  Administered 2018-12-16: 09:00:00 1 mL via EPIDURAL

## 2018-12-16 MED ORDER — MIDAZOLAM HCL 5 MG/5ML IJ SOLN
1.0000 mg | INTRAMUSCULAR | Status: DC | PRN
  Administered 2018-12-16: 1 mg via INTRAVENOUS
  Filled 2018-12-16: qty 5

## 2018-12-16 MED ORDER — DEXAMETHASONE SODIUM PHOSPHATE 10 MG/ML IJ SOLN
INTRAMUSCULAR | Status: AC
  Filled 2018-12-16: qty 1

## 2018-12-16 MED ORDER — ROPIVACAINE HCL 2 MG/ML IJ SOLN
INTRAMUSCULAR | Status: AC
  Filled 2018-12-16: qty 10

## 2018-12-16 MED ORDER — FENTANYL CITRATE (PF) 100 MCG/2ML IJ SOLN
25.0000 ug | INTRAMUSCULAR | Status: DC | PRN
  Administered 2018-12-16: 50 ug via INTRAVENOUS

## 2018-12-16 MED ORDER — HYDROCODONE-ACETAMINOPHEN 5-325 MG PO TABS: 1.0000 | ORAL_TABLET | Freq: Four times a day (QID) | ORAL | 0 refills | Status: AC | PRN

## 2018-12-16 MED ORDER — TIZANIDINE HCL 4 MG PO TABS: 4.0000 mg | ORAL_TABLET | Freq: Two times a day (BID) | ORAL | 0 refills | Status: DC | PRN

## 2018-12-16 MED ORDER — LIDOCAINE HCL 2 % IJ SOLN
INTRAMUSCULAR | Status: AC
  Filled 2018-12-16: qty 20

## 2018-12-16 NOTE — Progress Notes (Signed)
Patient's Name: Tony Benson  MRN: [id number]  Referring Provider: Gillis Santa, MD  DOB: [birth year]  PCP: Casilda Carls, MD  DOS: 12/16/2018  Note by: Gillis Santa, MD  Service setting: Ambulatory outpatient  Specialty: Interventional Pain Management  Patient type: Established  Location: [facility] (AMB) Pain Management Facility  Visit type: Interventional Procedure   Primary Reason for Visit: Interventional Pain Management Treatment. CC: Shoulder Pain (right) and Neck Pain (right)  Procedure:          Anesthesia, Analgesia, Anxiolysis:  Type: Cervical Facet, Medial Branch Radiofrequency Ablation  #1  Primary Purpose: Therapeutic Region: Posterolateral cervical spine region Level: C3, C4, C5,Medial Branch Level(s). Lesioning of these levels should completely denervate the C3-4, C4-5, cervical facet joints. Laterality: Right Paraspinal  Type: Moderate (Conscious) Sedation combined with Local Anesthesia Indication(s): Analgesia and Anxiety Route: Intravenous (IV) IV Access: Secured Sedation: Meaningful verbal contact was maintained at all times during the procedure  Local Anesthetic: Lidocaine 1-2%  Position: Prone with head of the table was raised to facilitate breathing.   Indications: 1. Cervical facet joint syndrome (C2,3,4,5)    Tony Benson has been dealing with the above chronic pain for longer than three months and has either failed to respond, was unable to tolerate, or simply did not get enough benefit from other more conservative therapies including, but not limited to: 1. Over-the-counter medications 2. Anti-inflammatory medications 3. Muscle relaxants 4. Membrane stabilizers 5. Opioids 6. Physical therapy and/or chiropractic manipulation 7. Modalities (Heat, ice, etc.) 8. Invasive techniques such as nerve blocks. Tony Benson has attained more than 50% relief of the pain from a series of diagnostic injections conducted in separate occasions.  Pain Score: Pre-procedure: 8  /10 Post-procedure: 0-No pain/10  Pre-op Assessment:  Tony Benson is a 58 y.o. (year old), male patient, seen today for interventional treatment. He  has a past surgical history that includes Lung surgery; Cholecystectomy; and Colonoscopy with propofol (N/A, 11/18/2018). Tony Benson has a current medication list which includes the following prescription(s): amlodipine, celecoxib, gabapentin, glipizide, losartan, metformin, metoprolol tartrate, omega-3 fatty acids, vitamin d (ergocalciferol), vitamin e, hydrocodone-acetaminophen, and tizanidine, and the following Facility-Administered Medications: fentanyl and midazolam. His primarily concern today is the Shoulder Pain (right) and Neck Pain (right)  Initial Vital Signs:  Pulse/HCG Rate: 77ECG Heart Rate: 73 Temp: 98.3 F (36.8 C) Resp: 18 BP: (!) 172/112 SpO2: 99 %  BMI: Estimated body mass index is 31.57 kg/m as calculated from the following:   Height as of this encounter: 5\' 10"  (1.778 m).   Weight as of this encounter: 220 lb (99.8 kg).  Risk Assessment: Allergies: Reviewed. He is allergic to other and morphine and related.  Allergy Precautions: None required Coagulopathies: Reviewed. None identified.  Blood-thinner therapy: None at this time Active Infection(s): Reviewed. None identified. Tony Benson is afebrile  Site Confirmation: Tony Benson was asked to confirm the procedure and laterality before marking the site Procedure checklist: Completed Consent: Before the procedure and under the influence of no sedative(s), amnesic(s), or anxiolytics, the patient was informed of the treatment options, risks and possible complications. To fulfill our ethical and legal obligations, as recommended by the American Medical Association's Code of Ethics, I have informed the patient of my clinical impression; the nature and purpose of the treatment or procedure; the risks, benefits, and possible complications of the intervention; the alternatives,  including doing nothing; the risk(s) and benefit(s) of the alternative treatment(s) or procedure(s); and the risk(s) and benefit(s) of doing nothing. The patient  was provided information about the general risks and possible complications associated with the procedure. These may include, but are not limited to: failure to achieve desired goals, infection, bleeding, organ or nerve damage, allergic reactions, paralysis, and death. In addition, the patient was informed of those risks and complications associated to Spine-related procedures, such as failure to decrease pain; infection (i.e.: Meningitis, epidural or intraspinal abscess); bleeding (i.e.: epidural hematoma, subarachnoid hemorrhage, or any other type of intraspinal or peri-dural bleeding); organ or nerve damage (i.e.: Any type of peripheral nerve, nerve root, or spinal cord injury) with subsequent damage to sensory, motor, and/or autonomic systems, resulting in permanent pain, numbness, and/or weakness of one or several areas of the body; allergic reactions; (i.e.: anaphylactic reaction); and/or death. Furthermore, the patient was informed of those risks and complications associated with the medications. These include, but are not limited to: allergic reactions (i.e.: anaphylactic or anaphylactoid reaction(s)); adrenal axis suppression; blood sugar elevation that in diabetics may result in ketoacidosis or comma; water retention that in patients with history of congestive heart failure may result in shortness of breath, pulmonary edema, and decompensation with resultant heart failure; weight gain; swelling or edema; medication-induced neural toxicity; particulate matter embolism and blood vessel occlusion with resultant organ, and/or nervous system infarction; and/or aseptic necrosis of one or more joints. Finally, the patient was informed that Medicine is not an exact science; therefore, there is also the possibility of unforeseen or unpredictable risks  and/or possible complications that may result in a catastrophic outcome. The patient indicated having understood very clearly. We have given the patient no guarantees and we have made no promises. Enough time was given to the patient to ask questions, all of which were answered to the patient's satisfaction. Mr. Barahona has indicated that he wanted to continue with the procedure. Attestation: I, the ordering provider, attest that I have discussed with the patient the benefits, risks, side-effects, alternatives, likelihood of achieving goals, and potential problems during recovery for the procedure that I have provided informed consent. Date   Time: 12/16/2018  7:52 AM  Pre-Procedure Preparation:  Monitoring: As per clinic protocol. Respiration, ETCO2, SpO2, BP, heart rate and rhythm monitor placed and checked for adequate function Safety Precautions: Patient was assessed for positional comfort and pressure points before starting the procedure. Time-out: I initiated and conducted the "Time-out" before starting the procedure, as per protocol. The patient was asked to participate by confirming the accuracy of the "Time Out" information. Verification of the correct person, site, and procedure were performed and confirmed by me, the nursing staff, and the patient. "Time-out" conducted as per Joint Commission's Universal Protocol (UP.01.01.01). Time: 0833  Description of Procedure:          Laterality: Right Level: C3, C4, C5,Medial Branch Level(s). Area Prepped: Entire Posterior Cervico-thoracic Region Prepping solution: DuraPrep (Iodine Povacrylex [0.7% available iodine] and Isopropyl Alcohol, 74% w/w) Safety Precautions: Aspiration looking for blood return was conducted prior to all injections. At no point did we inject any substances, as a needle was being advanced. Before injecting, the patient was told to immediately notify me if he was experiencing any new onset of "ringing in the ears, or metallic  taste in the mouth". No attempts were made at seeking any paresthesias. Safe injection practices and needle disposal techniques used. Medications properly checked for expiration dates. SDV (single dose vial) medications used. After the completion of the procedure, all disposable equipment used was discarded in the proper designated medical waste containers. Local Anesthesia: Protocol  guidelines were followed. The patient was positioned over the fluoroscopy table. The area was prepped in the usual manner. The time-out was completed. The target area was identified using fluoroscopy. A 12-in long, straight, sterile hemostat was used with fluoroscopic guidance to locate the targets for each level blocked. Once located, the skin was marked with an approved surgical skin marker. Once all sites were marked, the skin (epidermis, dermis, and hypodermis), as well as deeper tissues (fat, connective tissue and muscle) were infiltrated with a small amount of a short-acting local anesthetic, loaded on a 10cc syringe with a 25G, 1.5-in  Needle. An appropriate amount of time was allowed for local anesthetics to take effect before proceeding to the next step. Local Anesthetic: Lidocaine 2.0% The unused portion of the local anesthetic was discarded in the proper designated containers. Technical explanation of process:   Radiofrequency Ablation (RFA)  C3 Medial Branch Nerve RFA: The target area for the C3 dorsal medial articular branch is the lateral concave waist of the articular pillar of C3. Under fluoroscopic guidance, a Radiofrequency needle was inserted until contact was made with os over the postero-lateral aspect of the articular pillar of C3 (target area). Sensory and motor testing was conducted to properly adjust the position of the needle. Once satisfactory placement of the needle was achieved, the numbing solution was slowly injected after negative aspiration for blood. 2.0 mL of the nerve block solution was  injected without difficulty or complication. After waiting for at least 3 minutes, the ablation was performed. Once completed, the needle was removed intact. C4 Medial Branch Nerve RFA: The target area for the C4 dorsal medial articular branch is the lateral concave waist of the articular pillar of C4. Under fluoroscopic guidance, a Radiofrequency needle was inserted until contact was made with os over the postero-lateral aspect of the articular pillar of C4 (target area). Sensory and motor testing was conducted to properly adjust the position of the needle. Once satisfactory placement of the needle was achieved, the numbing solution was slowly injected after negative aspiration for blood. 2.0 mL of the nerve block solution was injected without difficulty or complication. After waiting for at least 3 minutes, the ablation was performed. Once completed, the needle was removed intact. C5 Medial Branch Nerve RFA: The target area for the C5 dorsal medial articular branch is the lateral concave waist of the articular pillar of C5. Under fluoroscopic guidance, a Radiofrequency needle was inserted until contact was made with os over the postero-lateral aspect of the articular pillar of C5 (target area). Sensory and motor testing was conducted to properly adjust the position of the needle. Once satisfactory placement of the needle was achieved, the numbing solution was slowly injected after negative aspiration for blood. 2.0 mL of the nerve block solution was injected without difficulty or complication. After waiting for at least 3 minutes, the ablation was performed. Once completed, the needle was removed intact.  Radiofrequency lesioning (ablation):  Radiofrequency Generator: NeuroTherm [id number] Sensory Stimulation Parameters: 50 Hz was used to locate & identify the nerve, making sure that the needle was positioned such that there was no sensory stimulation below 0.3 V or above 0.7 V. Motor Stimulation Parameters: 2  Hz was used to evaluate the motor component. Care was taken not to lesion any nerves that demonstrated motor stimulation of the lower extremities at an output of less than 2.5 times that of the sensory threshold, or a maximum of 2.0 V. Lesioning Technique Parameters: Standard Radiofrequency settings. (Not bipolar or  pulsed.) Temperature Settings: 80 degrees C Lesioning time: 60 seconds Intra-operative Compliance: Compliant Materials & Medications: Needle(s) (Electrode/Cannula) Type: Teflon-coated, curved tip, Radiofrequency needle(s) Gauge: 22G Length: 10cm Numbing solution: 5 cc solution made of 4 cc of 0.2% ropivacaine, 1 cc of Decadron 10 mg/cc.  1.5 cc injected at each level above on the right after sensorimotor testing prior to lesioning.  The unused portion of the solution was discarded in the proper designated containers.  Once the entire procedure was completed, the treated area was cleaned, making sure to leave some of the prepping solution back to take advantage of its long term bactericidal properties. Intra-operative Compliance: Compliant  Anatomy Reference Guide:          Vitals:   12/16/18 0911 12/16/18 0917 12/16/18 0927 12/16/18 0937  BP: 123/90 124/77 (!) 149/85 (!) 149/87  Pulse:      Resp: 16 16 16 16   Temp:  (!) 97 F (36.1 C)    TempSrc:      SpO2: 99% 99% 93% 95%  Weight:      Height:        Start Time: 0833 hrs. End Time: 0911 hrs.  Imaging Guidance (Spinal):          Type of Imaging Technique: Fluoroscopy Guidance (Spinal) Indication(s): Assistance in needle guidance and placement for procedures requiring needle placement in or near specific anatomical locations not easily accessible without such assistance. Exposure Time: Please see nurses notes. Contrast: None used. Fluoroscopic Guidance: I was personally present during the use of fluoroscopy. "Tunnel Vision Technique" used to obtain the best possible view of the target area. Parallax error corrected  before commencing the procedure. "Direction-depth-direction" technique used to introduce the needle under continuous pulsed fluoroscopy. Once target was reached, antero-posterior, oblique, and lateral fluoroscopic projection used confirm needle placement in all planes. Images permanently stored in EMR. Interpretation: No contrast injected. I personally interpreted the imaging intraoperatively. Adequate needle placement confirmed in multiple planes. Permanent images saved into the patient's record.   Antibiotic Prophylaxis:   Anti-infectives (From admission, onward)   None     Indication(s): None identified  Post-operative Assessment:  Post-procedure Vital Signs:  Pulse/HCG Rate: 7771 Temp: (!) 97 F (36.1 C) Resp: 16 BP: (!) 149/87 SpO2: 95 %  EBL: None  Complications: No immediate post-treatment complications observed by team, or reported by patient.  Note: The patient tolerated the entire procedure well. A repeat set of vitals were taken after the procedure and the patient was kept under observation following institutional policy, for this type of procedure. Post-procedural neurological assessment was performed, showing return to baseline, prior to discharge. The patient was provided with post-procedure discharge instructions, including a section on how to identify potential problems. Should any problems arise concerning this procedure, the patient was given instructions to immediately contact us, at any time, without hesitation. In any case, we plan to contact the patient by telephone for a follow-up status report regarding this interventional procedure.  Comments:  No additional relevant information. 5 out of 5 strength bilateral upper extremity: Shoulder abduction, elbow flexion, elbow extension, thumb extension.  Plan of Care  Orders:  Orders Placed This Encounter  Procedures   Radiofrequency,Cervical    Standing Status:   Future    Standing Expiration Date:   06/15/2019     Scheduling Instructions:     Side(s): LEFT     Level(s): C3, C4, C5,  Medial Branch Nerve(s)     Sedation: With Sedation     Scheduling  Timeframe: As soon as pre-approved    Order Specific Question:   Where will this procedure be performed?    Answer:   [facility]   DG PA[facility] C-ARM 1-60 MIN NO REPORT    Intraoperative interpretation by procedural physician at [place].    Standing Status:   Standing    Number of Occurrences:   1    Order Specific Question:   Reason for exam:    Answer:   Assistance in needle guidance and placement for procedures requiring needle placement in or near specific anatomical locations not easily accessible without such assistance.   Medications ordered for procedure: Meds ordered this encounter  Medications   lidocaine (XYLOCAINE) 2 % (with pres) injection 400 mg   midazolam (VERSED) 5 MG/5ML injection 1-2 mg    Make sure Flumazenil is available in the pyxis when using this medication. If oversedation occurs, administer 0.2 mg IV over 15 sec. If after 45 sec no response, administer 0.2 mg again over 1 min; may repeat at 1 min intervals; not to exceed 4 doses (1 mg)   fentaNYL (SUBLIMAZE) injection 25-50 mcg    Make sure Narcan is available in the pyxis when using this medication. In the event of respiratory depression (RR< 8/min): Titrate NARCAN (naloxone) in increments of 0.1 to 0.2 mg IV at 2-3 minute intervals, until desired degree of reversal.   ropivacaine (PF) 2 mg/mL (0.2%) (NAROPIN) injection 1 mL   dexamethasone (DECADRON) injection 10 mg   tiZANidine (ZANAFLEX) 4 MG tablet    Sig: Take 1 tablet (4 mg total) by mouth 2 (two) times daily as needed for muscle spasms.    Dispense:  60 tablet    Refill:  0   HYDROcodone-acetaminophen (NORCO) 5-325 MG tablet    Sig: Take 1 tablet by mouth every 6 (six) hours as needed for up to 5 days for moderate pain.    Dispense:  20 tablet    Refill:  0    For acute  post-operative pain. Not to be refilled. To last 7 days.   Medications administered: We administered lidocaine, midazolam, fentaNYL, ropivacaine (PF) 2 mg/mL (0.2%), and dexamethasone.  See the medical record for exact dosing, route, and time of administration.  Follow-up plan:   Return in about 2 weeks (around 12/30/2018) for Contra-lateral RFA: R C3,4,5 w sedation.      Status post right C3, C4, C5 RFA on 12/16/2018, follow-up for left in 2 weeks.   Recent Visits No visits were found meeting these conditions.  Showing recent visits within past 90 days and meeting all other requirements   Today's Visits Date Type Provider Dept  12/16/18 Procedure visit Gillis Santa, MD [facility]  Showing today's visits and meeting all other requirements   Future Appointments Date Type Provider Dept  01/13/19 Appointment Gillis Santa, MD [facility]  Showing future appointments within next 90 days and meeting all other requirements   Disposition: Discharge home  Discharge Date & Time: 12/16/2018; 0938 hrs.   Primary Care Physician: Casilda Carls, MD Location: [facility] Note by: Gillis Santa, MD Date: 12/16/2018; Time: 10:30 AM  Disclaimer:  Medicine is not an exact science. The only guarantee in medicine is that nothing is guaranteed. It is important to note that the decision to proceed with this intervention was based on the information collected from the patient. The Data and conclusions were drawn from the patient's questionnaire, the interview, and the physical  examination. Because the information was provided in large part by the patient, it cannot be guaranteed that it has not been purposely or unconsciously manipulated. Every effort has been made to obtain as much relevant data as possible for this evaluation. It is important to note that the conclusions that lead to this procedure are derived in large part from the available data. Always take  into account that the treatment will also be dependent on availability of resources and existing treatment guidelines, considered by other Pain Management Practitioners as being common knowledge and practice, at the time of the intervention. For Medico-Legal purposes, it is also important to point out that variation in procedural techniques and pharmacological choices are the acceptable norm. The indications, contraindications, technique, and results of the above procedure should only be interpreted and judged by a Board-Certified Interventional Pain Specialist with extensive familiarity and expertise in the same exact procedure and technique.

## 2018-12-16 NOTE — Addendum Note (Signed)
Addended by: Gillis Santa on: 12/16/2018 10:44 AM   Modules accepted: Orders

## 2018-12-16 NOTE — Patient Instructions (Signed)
Post-procedure Information What to expect: Most procedures involve the use of a local anesthetic (numbing medicine), and a steroid (anti-inflammatory medicine).  The local anesthetics may cause temporary numbness and weakness of the legs or arms, depending on the location of the block. This numbness/weakness may last 4-6 hours, depending on the local anesthetic used. In rare instances, it can last up to 24 hours. While numb, you must be very careful not to injure the extremity.  After any procedure, you could expect the pain to get better within 15-20 minutes. This relief is temporary and may last 4-6 hours. Once the local anesthetics wears off, you could experience discomfort, possibly more than usual, for up to 10 (ten) days. In the case of radiofrequencies, it may last up to 6 weeks. Surgeries may take up to 8 weeks for the healing process. The discomfort is due to the irritation caused by needles going through skin and muscle. To minimize the discomfort, we recommend using ice the first day, and heat from then on. The ice should be applied for 15 minutes on, and 15 minutes off. Keep repeating this cycle until bedtime. Avoid applying the ice directly to the skin, to prevent frostbite. Heat should be used daily, until the pain improves (4-10 days). Be careful not to burn yourself.  Occasionally you may experience muscle spasms or cramps. These occur as a consequence of the irritation caused by the needle sticks to the muscle and the blood that will inevitably be lost into the surrounding muscle tissue. Blood tends to be very irritating to tissues, which tend to react by going into spasm. These spasms may start the same day of your procedure, but they may also take days to develop. This late onset type of spasm or cramp is usually caused by electrolyte imbalances triggered by the steroids, at the level of the kidney. Cramps and spasms tend to respond well to muscle relaxants, multivitamins (some are  triggered by the procedure, but may have their origins in vitamin deficiencies), and "Gatorade", or any sports drinks that can replenish any electrolyte imbalances. (If you are a diabetic, ask your pharmacist to get you a sugar-free brand.) Warm showers or baths may also be helpful. Stretching exercises are highly recommended. General Instructions:  Be alert for signs of possible infection: redness, swelling, heat, red streaks, elevated temperature, and/or fever. These typically appear 4 to 6 days after the procedure. Immediately notify your doctor if you experience unusual bleeding, difficulty breathing, or loss of bowel or bladder control. If you experience increased pain, do not increase your pain medicine intake, unless instructed by your pain physician. Post-Procedure Care:  Be careful in moving about. Muscle spasms in the area of the injection may occur. Applying ice or heat to the area is often helpful. The incidence of spinal headaches after epidural injections ranges between 1.4% and 6%. If you develop a headache that does not seem to respond to conservative therapy, please let your physician know. This can be treated with an epidural blood patch.   Post-procedure numbness or redness is to be expected, however it should average 4 to 6 hours. If numbness and weakness of your extremities begins to develop 4 to 6 hours after your procedure, and is felt to be progressing and worsening, immediately contact your physician.   Diet:  If you experience nausea, do not eat until this sensation goes away. If you had a "Stellate Ganglion Block" for upper extremity "Reflex Sympathetic Dystrophy", do not eat or drink until your   hoarseness goes away. In any case, always start with liquids first and if you tolerate them well, then slowly progress to more solid foods. Activity:  For the first 4 to 6 hours after the procedure, use caution in moving about as you may experience numbness and/or weakness. Use caution in  cooking, using household electrical appliances, and climbing steps. If you need to reach your Doctor call our office: [phone] Monday-Thursday 8:00 am - 4:00 PM    Fridays: Closed     In case of an emergency: In case of emergency, call 911 or go to the nearest emergency room and have the physician there call us.  Interpretation of Procedure Every nerve block has two components: a diagnostic component, and a treatment component. Unrealistic expectations are the most common causes of "perceived failure".  In a perfect world, a single nerve block should be able to completely and permanently eliminate the pain. Sadly, the world is not perfect.  Most pain management nerve blocks are performed using local anesthetics and steroids. Steroids are responsible for any long-term benefit that you may experience. Their purpose is to decrease any chronic swelling that may exist in the area. Steroids begin to work immediately after being injected. However, most patients will not experience any benefits until 5 to 10 days after the injection, when the swelling has come down to the point where they can tell a difference. Steroids will only help if there is swelling to be treated. As such, they can assist with the diagnosis. If effective, they suggest an inflammatory component to the pain, and if ineffective, they rule out inflammation as the main cause or component of the problem. If the problem is one of mechanical compression, you will get no benefit from those steroids.   In the case of local anesthetics, they have a crucial role in the diagnosis of your condition. Most will begin to work within15 to 20 minutes after injection. The duration will depend on the type used (short- vs. Long-acting). It is of outmost importance that patients keep tract of their pain, after the procedure. To assist with this matter, a "Post-procedure Pain Diary" is provided. Make sure to complete it and to bring it back to your  follow-up appointment.  As long as the patient keeps accurate, detailed records of their symptoms after every procedure, and returns to have those interpreted, every procedure will provide us with invaluable information. Even a block that does not provide the patient with any relief, will always provide us with information about the mechanism and the origin of the pain. The only time a nerve block can be considered a waste of time is when patients do not keep track of the results, or do not keep their post-procedure appointment.  Reporting the results back to your physician The Pain Score  Pain is a subjective complaint. It cannot be seen, touched, or measured. We depend entirely on the patient's report of the pain in order to assess your condition and treatment. To evaluate the pain, we use a pain scale, where "0" means "No Pain", and a "10" is "the worst possible pain that you can even imagine" (i.e. something like been eaten alive by a shark or being torn apart by a lion).   You will frequently be asked to rate your pain. Please be as accurate, remember that medical decisions will be based on your responses. Please do not rate your pain above a 10. Doing so is actually interpreted as "symptom magnification" (exaggeration), as   well as lack of understanding with regards to the scale. To put this into perspective, when you tell us that your pain is at a 10 (ten), what you are saying is that there is nothing we can do to make this pain any worse. (Carefully think about that.) Preparing for Procedure with Sedation Instructions: . Oral Intake: Do not eat or drink anything for at least 8 hours prior to your procedure. . Transportation: Public transportation is not allowed. Bring an adult driver. The driver must be physically present in our waiting room before any procedure can be started. . Physical Assistance: Bring an adult capable of physically assisting you, in the event you need help. . Blood Pressure  Medicine: Take your blood pressure medicine with a sip of water the morning of the procedure. . Insulin: Take only  of your normal insulin dose. . Preventing infections: Shower with an antibacterial soap the morning of your procedure. . Build-up your immune system: Take 1000 mg of Vitamin C with every meal (3 times a day) the day prior to your procedure. . Pregnancy: If you are pregnant, call and cancel the procedure. . Sickness: If you have a cold, fever, or any active infections, call and cancel the procedure. . Arrival: You must be in the facility at least 30 minutes prior to your scheduled procedure. . Children: Do not bring children with you. . Dress appropriately: Bring dark clothing that you would not mind if they get stained. . Valuables: Do not bring any jewelry or valuables. Procedure appointments are reserved for interventional treatments only. . No Prescription Refills. . No medication changes will be discussed during procedure appointments. . No disability issues will be discussed. .  

## 2018-12-16 NOTE — Progress Notes (Signed)
Safety precautions to be maintained throughout the outpatient stay will include: orient to surroundings, keep bed in low position, maintain call bell within reach at all times, provide assistance with transfer out of bed and ambulation.  

## 2018-12-17 ENCOUNTER — Telehealth: Payer: Self-pay | Admitting: *Deleted

## 2018-12-17 NOTE — Telephone Encounter (Signed)
Voicemail left with patient re; procedure on yesterday.  

## 2019-01-07 ENCOUNTER — Other Ambulatory Visit: Payer: Self-pay | Admitting: Student in an Organized Health Care Education/Training Program

## 2019-01-13 ENCOUNTER — Encounter: Payer: Self-pay | Admitting: Student in an Organized Health Care Education/Training Program

## 2019-01-13 ENCOUNTER — Ambulatory Visit (HOSPITAL_BASED_OUTPATIENT_CLINIC_OR_DEPARTMENT_OTHER): Payer: BC Managed Care – PPO | Admitting: Student in an Organized Health Care Education/Training Program

## 2019-01-13 ENCOUNTER — Other Ambulatory Visit: Payer: Self-pay

## 2019-01-13 ENCOUNTER — Ambulatory Visit
Admission: RE | Admit: 2019-01-13 | Discharge: 2019-01-13 | Disposition: A | Discharge status: Home / Self Care | Payer: BC Managed Care – PPO | Source: Ambulatory Visit | Attending: Student in an Organized Health Care Education/Training Program | Admitting: Student in an Organized Health Care Education/Training Program

## 2019-01-13 VITALS — BP 160/97 | HR 82 | Temp 98.2°F | Resp 18 | Ht 70.0 in | Wt 218.0 lb

## 2019-01-13 DIAGNOSIS — M47812 Spondylosis without myelopathy or radiculopathy, cervical region: Secondary | ICD-10-CM

## 2019-01-13 MED ORDER — DEXAMETHASONE SODIUM PHOSPHATE 10 MG/ML IJ SOLN
10.0000 mg | Freq: Once | INTRAMUSCULAR | Status: AC
  Administered 2019-01-13: 10 mg

## 2019-01-13 MED ORDER — ROPIVACAINE HCL 2 MG/ML IJ SOLN: 1.0000 mL | Freq: Once | INTRAMUSCULAR | Status: DC

## 2019-01-13 MED ORDER — PREDNISONE 20 MG PO TABS: ORAL_TABLET | ORAL | 0 refills | Status: AC

## 2019-01-13 MED ORDER — LIDOCAINE HCL 2 % IJ SOLN
INTRAMUSCULAR | Status: AC
  Filled 2019-01-13: qty 20

## 2019-01-13 MED ORDER — DEXAMETHASONE SODIUM PHOSPHATE 10 MG/ML IJ SOLN
INTRAMUSCULAR | Status: AC
  Filled 2019-01-13: qty 1

## 2019-01-13 MED ORDER — TIZANIDINE HCL 4 MG PO TABS: 4.0000 mg | ORAL_TABLET | Freq: Two times a day (BID) | ORAL | 0 refills | Status: AC | PRN

## 2019-01-13 MED ORDER — HYDROCODONE-ACETAMINOPHEN 5-325 MG PO TABS: 1.0000 | ORAL_TABLET | Freq: Four times a day (QID) | ORAL | 0 refills | Status: AC | PRN

## 2019-01-13 MED ORDER — MIDAZOLAM HCL 5 MG/5ML IJ SOLN
1.0000 mg | INTRAMUSCULAR | Status: DC | PRN
  Administered 2019-01-13: 1 mg via INTRAVENOUS
  Filled 2019-01-13: qty 5

## 2019-01-13 MED ORDER — FENTANYL CITRATE (PF) 100 MCG/2ML IJ SOLN
25.0000 ug | INTRAMUSCULAR | Status: DC | PRN
  Administered 2019-01-13: 75 ug via INTRAVENOUS

## 2019-01-13 MED ORDER — FENTANYL CITRATE (PF) 100 MCG/2ML IJ SOLN
INTRAMUSCULAR | Status: AC
  Filled 2019-01-13: qty 2

## 2019-01-13 MED ORDER — ROPIVACAINE HCL 2 MG/ML IJ SOLN
INTRAMUSCULAR | Status: AC
  Filled 2019-01-13: qty 10

## 2019-01-13 NOTE — Progress Notes (Signed)
Safety precautions to be maintained throughout the outpatient stay will include: orient to surroundings, keep bed in low position, maintain call bell within reach at all times, provide assistance with transfer out of bed and ambulation.  

## 2019-01-13 NOTE — Patient Instructions (Signed)

## 2019-01-13 NOTE — Progress Notes (Signed)
Patient's Name: Tony Benson  MRN: [id number]  Referring Provider: Casilda Carls, MD  DOB: Apr 17, 1960  PCP: Casilda Carls, MD  DOS: 01/13/2019  Note by: Gillis Santa, MD  Service setting: Ambulatory outpatient  Specialty: Interventional Pain Management  Patient type: Established  Location: [facility] (AMB) Pain Management Facility  Visit type: Interventional Procedure   Primary Reason for Visit: Interventional Pain Management Treatment. CC: Neck Pain (right and left)  Procedure:          Anesthesia, Analgesia, Anxiolysis:  Type: Cervical Facet, Medial Branch Radiofrequency Ablation  #1  Primary Purpose: Therapeutic Region: Posterolateral cervical spine region Level: C3, C4, C5,Medial Branch Level(s). Lesioning of these levels should completely denervate the C3-4, C4-5, cervical facet joints. Laterality: Left Paraspinal  Type: Moderate (Conscious) Sedation combined with Local Anesthesia Indication(s): Analgesia and Anxiety Route: Intravenous (IV) IV Access: Secured Sedation: Meaningful verbal contact was maintained at all times during the procedure  Local Anesthetic: Lidocaine 1-2%  Position: Prone with head of the table was raised to facilitate breathing.   Indications: 1. Cervical facet joint syndrome (C2,3,4,5)    Mr. Hafen has been dealing with the above chronic pain for longer than three months and has either failed to respond, was unable to tolerate, or simply did not get enough benefit from other more conservative therapies including, but not limited to: 1. Over-the-counter medications 2. Anti-inflammatory medications 3. Muscle relaxants 4. Membrane stabilizers 5. Opioids 6. Physical therapy and/or chiropractic manipulation 7. Modalities (Heat, ice, etc.) 8. Invasive techniques such as nerve blocks. Mr. Facenda has attained more than 50% relief of the pain from a series of diagnostic injections conducted in separate occasions.  Pain Score: Pre-procedure: 0-No  pain/10 Post-procedure: 0-No pain/10  Pre-op Assessment:  Mr. Rozon is a 58 y.o. (year old), male patient, seen today for interventional treatment. He  has a past surgical history that includes Lung surgery; Cholecystectomy; and Colonoscopy with propofol (N/A, 11/18/2018). Mr. Constanza has a current medication list which includes the following prescription(s): amlodipine, celecoxib, diazepam, gabapentin, glipizide, losartan, metformin, metoprolol tartrate, omega-3 fatty acids, tizanidine, vitamin d (ergocalciferol), vitamin e, hydrocodone-acetaminophen, and prednisone, and the following Facility-Administered Medications: fentanyl, midazolam, and ropivacaine (pf) 2 mg/ml (0.2%). His primarily concern today is the Neck Pain (right and left)  Initial Vital Signs:  Pulse/HCG Rate: 82ECG Heart Rate: 79 Temp: 98.2 F (36.8 C) Resp: 16 BP: (!) 162/101 SpO2: 100 %  BMI: Estimated body mass index is 31.28 kg/m as calculated from the following:   Height as of this encounter: 5\' 10"  (1.778 m).   Weight as of this encounter: 218 lb (98.9 kg).  Risk Assessment: Allergies: Reviewed. He is allergic to other and morphine and related.  Allergy Precautions: None required Coagulopathies: Reviewed. None identified.  Blood-thinner therapy: None at this time Active Infection(s): Reviewed. None identified. Mr. Hayenga is afebrile  Site Confirmation: Mr. Metzker was asked to confirm the procedure and laterality before marking the site Procedure checklist: Completed Consent: Before the procedure and under the influence of no sedative(s), amnesic(s), or anxiolytics, the patient was informed of the treatment options, risks and possible complications. To fulfill our ethical and legal obligations, as recommended by the American Medical Association's Code of Ethics, I have informed the patient of my clinical impression; the nature and purpose of the treatment or procedure; the risks, benefits, and possible complications of  the intervention; the alternatives, including doing nothing; the risk(s) and benefit(s) of the alternative treatment(s) or procedure(s); and the risk(s) and benefit(s) of doing  nothing. The patient was provided information about the general risks and possible complications associated with the procedure. These may include, but are not limited to: failure to achieve desired goals, infection, bleeding, organ or nerve damage, allergic reactions, paralysis, and death. In addition, the patient was informed of those risks and complications associated to Spine-related procedures, such as failure to decrease pain; infection (i.e.: Meningitis, epidural or intraspinal abscess); bleeding (i.e.: epidural hematoma, subarachnoid hemorrhage, or any other type of intraspinal or peri-dural bleeding); organ or nerve damage (i.e.: Any type of peripheral nerve, nerve root, or spinal cord injury) with subsequent damage to sensory, motor, and/or autonomic systems, resulting in permanent pain, numbness, and/or weakness of one or several areas of the body; allergic reactions; (i.e.: anaphylactic reaction); and/or death. Furthermore, the patient was informed of those risks and complications associated with the medications. These include, but are not limited to: allergic reactions (i.e.: anaphylactic or anaphylactoid reaction(s)); adrenal axis suppression; blood sugar elevation that in diabetics may result in ketoacidosis or comma; water retention that in patients with history of congestive heart failure may result in shortness of breath, pulmonary edema, and decompensation with resultant heart failure; weight gain; swelling or edema; medication-induced neural toxicity; particulate matter embolism and blood vessel occlusion with resultant organ, and/or nervous system infarction; and/or aseptic necrosis of one or more joints. Finally, the patient was informed that Medicine is not an exact science; therefore, there is also the possibility  of unforeseen or unpredictable risks and/or possible complications that may result in a catastrophic outcome. The patient indicated having understood very clearly. We have given the patient no guarantees and we have made no promises. Enough time was given to the patient to ask questions, all of which were answered to the patient's satisfaction. Mr. Neet has indicated that he wanted to continue with the procedure. Attestation: I, the ordering provider, attest that I have discussed with the patient the benefits, risks, side-effects, alternatives, likelihood of achieving goals, and potential problems during recovery for the procedure that I have provided informed consent. Date   Time: 01/13/2019  7:50 AM  Pre-Procedure Preparation:  Monitoring: As per clinic protocol. Respiration, ETCO2, SpO2, BP, heart rate and rhythm monitor placed and checked for adequate function Safety Precautions: Patient was assessed for positional comfort and pressure points before starting the procedure. Time-out: I initiated and conducted the "Time-out" before starting the procedure, as per protocol. The patient was asked to participate by confirming the accuracy of the "Time Out" information. Verification of the correct person, site, and procedure were performed and confirmed by me, the nursing staff, and the patient. "Time-out" conducted as per Joint Commission's Universal Protocol (UP.01.01.01). Time: 0833  Description of Procedure:          Laterality: Left Level: C3, C4, C5,Medial Branch Level(s). Area Prepped: Entire Posterior Cervico-thoracic Region Prepping solution: DuraPrep (Iodine Povacrylex [0.7% available iodine] and Isopropyl Alcohol, 74% w/w) Safety Precautions: Aspiration looking for blood return was conducted prior to all injections. At no point did we inject any substances, as a needle was being advanced. Before injecting, the patient was told to immediately notify me if he was experiencing any new onset of  "ringing in the ears, or metallic taste in the mouth". No attempts were made at seeking any paresthesias. Safe injection practices and needle disposal techniques used. Medications properly checked for expiration dates. SDV (single dose vial) medications used. After the completion of the procedure, all disposable equipment used was discarded in the proper designated medical waste containers.  Local Anesthesia: Protocol guidelines were followed. The patient was positioned over the fluoroscopy table. The area was prepped in the usual manner. The time-out was completed. The target area was identified using fluoroscopy. A 12-in long, straight, sterile hemostat was used with fluoroscopic guidance to locate the targets for each level blocked. Once located, the skin was marked with an approved surgical skin marker. Once all sites were marked, the skin (epidermis, dermis, and hypodermis), as well as deeper tissues (fat, connective tissue and muscle) were infiltrated with a small amount of a short-acting local anesthetic, loaded on a 10cc syringe with a 25G, 1.5-in  Needle. An appropriate amount of time was allowed for local anesthetics to take effect before proceeding to the next step. Local Anesthetic: Lidocaine 2.0% The unused portion of the local anesthetic was discarded in the proper designated containers. Technical explanation of process:   Radiofrequency Ablation (RFA)  C3 Medial Branch Nerve RFA: The target area for the C3 dorsal medial articular branch is the lateral concave waist of the articular pillar of C3. Under fluoroscopic guidance, a Radiofrequency needle was inserted until contact was made with os over the postero-lateral aspect of the articular pillar of C3 (target area). Sensory and motor testing was conducted to properly adjust the position of the needle. Once satisfactory placement of the needle was achieved, the numbing solution was slowly injected after negative aspiration for blood. 2.0 mL of  the nerve block solution was injected without difficulty or complication. After waiting for at least 3 minutes, the ablation was performed. Once completed, the needle was removed intact. C4 Medial Branch Nerve RFA: The target area for the C4 dorsal medial articular branch is the lateral concave waist of the articular pillar of C4. Under fluoroscopic guidance, a Radiofrequency needle was inserted until contact was made with os over the postero-lateral aspect of the articular pillar of C4 (target area). Sensory and motor testing was conducted to properly adjust the position of the needle. Once satisfactory placement of the needle was achieved, the numbing solution was slowly injected after negative aspiration for blood. 2.0 mL of the nerve block solution was injected without difficulty or complication. After waiting for at least 3 minutes, the ablation was performed. Once completed, the needle was removed intact. C5 Medial Branch Nerve RFA: The target area for the C5 dorsal medial articular branch is the lateral concave waist of the articular pillar of C5. Under fluoroscopic guidance, a Radiofrequency needle was inserted until contact was made with os over the postero-lateral aspect of the articular pillar of C5 (target area). Sensory and motor testing was conducted to properly adjust the position of the needle. Once satisfactory placement of the needle was achieved, the numbing solution was slowly injected after negative aspiration for blood. 2.0 mL of the nerve block solution was injected without difficulty or complication. After waiting for at least 3 minutes, the ablation was performed. Once completed, the needle was removed intact.  Radiofrequency lesioning (ablation):  Radiofrequency Generator: NeuroTherm [id number] Sensory Stimulation Parameters: 50 Hz was used to locate & identify the nerve, making sure that the needle was positioned such that there was no sensory stimulation below 0.3 V or above 0.7  V. Motor Stimulation Parameters: 2 Hz was used to evaluate the motor component. Care was taken not to lesion any nerves that demonstrated motor stimulation of the lower extremities at an output of less than 2.5 times that of the sensory threshold, or a maximum of 2.0 V. Lesioning Technique Parameters: Standard Radiofrequency settings. (  Not bipolar or pulsed.) Temperature Settings: 80 degrees C Lesioning time: 60 seconds Intra-operative Compliance: Compliant Materials & Medications: Needle(s) (Electrode/Cannula) Type: Teflon-coated, curved tip, Radiofrequency needle(s) Gauge: 22G Length: 10cm Numbing solution: 5 cc solution made of 4 cc of 0.2% ropivacaine, 1 cc of Decadron 10 mg/cc.  1.5 cc injected at each level above on the right after sensorimotor testing prior to lesioning.  The unused portion of the solution was discarded in the proper designated containers.  Once the entire procedure was completed, the treated area was cleaned, making sure to leave some of the prepping solution back to take advantage of its long term bactericidal properties. Intra-operative Compliance: Compliant  Anatomy Reference Guide:          Vitals:   01/13/19 0901 01/13/19 0910 01/13/19 0921 01/13/19 0931  BP: (!) 161/96 (!) 149/86 (!) 163/100 (!) 160/97  Pulse:      Resp: 20 20 18 18   Temp:  98.2 F (36.8 C)    SpO2: 96% 100% 99% 99%  Weight:      Height:        Start Time: 0833 hrs. End Time: 0901 hrs.  Imaging Guidance (Spinal):          Type of Imaging Technique: Fluoroscopy Guidance (Spinal) Indication(s): Assistance in needle guidance and placement for procedures requiring needle placement in or near specific anatomical locations not easily accessible without such assistance. Exposure Time: Please see nurses notes. Contrast: None used. Fluoroscopic Guidance: I was personally present during the use of fluoroscopy. "Tunnel Vision Technique" used to obtain the best possible view of the target  area. Parallax error corrected before commencing the procedure. "Direction-depth-direction" technique used to introduce the needle under continuous pulsed fluoroscopy. Once target was reached, antero-posterior, oblique, and lateral fluoroscopic projection used confirm needle placement in all planes. Images permanently stored in EMR. Interpretation: No contrast injected. I personally interpreted the imaging intraoperatively. Adequate needle placement confirmed in multiple planes. Permanent images saved into the patient's record.   Antibiotic Prophylaxis:   Anti-infectives (From admission, onward)   None     Indication(s): None identified  Post-operative Assessment:  Post-procedure Vital Signs:  Pulse/HCG Rate: 8277 Temp: 98.2 F (36.8 C) Resp: 18 BP: (!) 160/97 SpO2: 99 %  EBL: None  Complications: No immediate post-treatment complications observed by team, or reported by patient.  Note: The patient tolerated the entire procedure well. A repeat set of vitals were taken after the procedure and the patient was kept under observation following institutional policy, for this type of procedure. Post-procedural neurological assessment was performed, showing return to baseline, prior to discharge. The patient was provided with post-procedure discharge instructions, including a section on how to identify potential problems. Should any problems arise concerning this procedure, the patient was given instructions to immediately contact us, at any time, without hesitation. In any case, we plan to contact the patient by telephone for a follow-up status report regarding this interventional procedure.  Comments:  No additional relevant information. 5 out of 5 strength bilateral upper extremity: Shoulder abduction, elbow flexion, elbow extension, thumb extension.  Plan of Care  Orders:  Orders Placed This Encounter  Procedures   DG PAIN CLIN[facility] C-ARM 1-60 MIN NO REPORT    Intraoperative  interpretation by procedural physician at [facility].    Standing Status:   Standing    Number of Occurrences:   1    Order Specific Question:   Reason for exam:    Answer:   Assistance in  needle guidance and placement for procedures requiring needle placement in or near specific anatomical locations not easily accessible without such assistance.   Medications ordered for procedure: Meds ordered this encounter  Medications   fentaNYL (SUBLIMAZE) injection 25-50 mcg    Make sure Narcan is available in the pyxis when using this medication. In the event of respiratory depression (RR< 8/min): Titrate NARCAN (naloxone) in increments of 0.1 to 0.2 mg IV at 2-3 minute intervals, until desired degree of reversal.   midazolam (VERSED) 5 MG/5ML injection 1-2 mg    Make sure Flumazenil is available in the pyxis when using this medication. If oversedation occurs, administer 0.2 mg IV over 15 sec. If after 45 sec no response, administer 0.2 mg again over 1 min; may repeat at 1 min intervals; not to exceed 4 doses (1 mg)   dexamethasone (DECADRON) injection 10 mg   ropivacaine (PF) 2 mg/mL (0.2%) (NAROPIN) injection 1 mL   dexamethasone (DECADRON) injection 10 mg   tiZANidine (ZANAFLEX) 4 MG tablet    Sig: Take 1 tablet (4 mg total) by mouth 2 (two) times daily as needed for muscle spasms.    Dispense:  60 tablet    Refill:  0   HYDROcodone-acetaminophen (NORCO/VICODIN) 5-325 MG tablet    Sig: Take 1 tablet by mouth every 6 (six) hours as needed for up to 5 days for severe pain. Must last 7 days.    Dispense:  20 tablet    Refill:  0    For acute post-procedural pain. Not to be refilled.   predniSONE (DELTASONE) 20 MG tablet    Sig: Take 3 tablets (60 mg total) by mouth daily with breakfast for 3 days, THEN 2 tablets (40 mg total) daily with breakfast for 3 days, THEN 1 tablet (20 mg total) daily with breakfast for 3 days.    Dispense:  18 tablet    Refill:  0   Medications  administered: We administered fentaNYL, midazolam, dexamethasone, and dexamethasone.  See the medical record for exact dosing, route, and time of administration.  Follow-up plan:   Return in about 6 weeks (around 02/24/2019) for Post Procedure Evaluation, virtual.      Status post right C3, C4, C5 RFA on 12/16/2018, left 01/13/2019.   Recent Visits Date Type Provider Dept  12/16/18 Procedure visit Gillis Santa, MD [facility]  Showing recent visits within past 90 days and meeting all other requirements   Today's Visits Date Type Provider Dept  01/13/19 Procedure visit Gillis Santa, MD [facility]  Showing today's visits and meeting all other requirements   Future Appointments Date Type Provider Dept  02/24/19 Appointment Gillis Santa, MD [facility]  Showing future appointments within next 90 days and meeting all other requirements   Disposition: Discharge home  Discharge Date & Time: 01/13/2019; 0932 hrs.   Primary Care Physician: Casilda Carls, MD Location: [facility] Note by: Gillis Santa, MD Date: 01/13/2019; Time: 3:49 PM  Disclaimer:  Medicine is not an exact science. The only guarantee in medicine is that nothing is guaranteed. It is important to note that the decision to proceed with this intervention was based on the information collected from the patient. The Data and conclusions were drawn from the patient's questionnaire, the interview, and the physical examination. Because the information was provided in large part by the patient, it cannot be guaranteed that it has not been purposely or unconsciously manipulated. Every effort has been made to obtain  as much relevant data as possible for this evaluation. It is important to note that the conclusions that lead to this procedure are derived in large part from the available data. Always take into account that the treatment will also be dependent on availability of  resources and existing treatment guidelines, considered by other Pain Management Practitioners as being common knowledge and practice, at the time of the intervention. For Medico-Legal purposes, it is also important to point out that variation in procedural techniques and pharmacological choices are the acceptable norm. The indications, contraindications, technique, and results of the above procedure should only be interpreted and judged by a Board-Certified Interventional Pain Specialist with extensive familiarity and expertise in the same exact procedure and technique.

## 2019-01-14 ENCOUNTER — Telehealth: Payer: Self-pay

## 2019-01-14 NOTE — Telephone Encounter (Signed)
Attempted to call patient. Went to AM on the first ring and left message to call if any complications.

## 2019-02-05 ENCOUNTER — Other Ambulatory Visit: Payer: Self-pay | Admitting: Student in an Organized Health Care Education/Training Program

## 2019-02-23 ENCOUNTER — Encounter: Payer: Self-pay | Admitting: Student in an Organized Health Care Education/Training Program

## 2019-02-23 ENCOUNTER — Telehealth: Payer: Self-pay

## 2019-02-23 ENCOUNTER — Telehealth: Payer: Self-pay | Admitting: *Deleted

## 2019-02-23 NOTE — Telephone Encounter (Signed)
LM for patient to call us back to go over VV appointment.

## 2019-02-24 ENCOUNTER — Encounter: Payer: Self-pay | Admitting: Student in an Organized Health Care Education/Training Program

## 2019-02-24 ENCOUNTER — Other Ambulatory Visit: Payer: Self-pay

## 2019-02-24 ENCOUNTER — Ambulatory Visit
Payer: BC Managed Care – PPO | Attending: Student in an Organized Health Care Education/Training Program | Admitting: Student in an Organized Health Care Education/Training Program

## 2019-02-24 DIAGNOSIS — G8929 Other chronic pain: Secondary | ICD-10-CM | POA: Insufficient documentation

## 2019-02-24 DIAGNOSIS — M7918 Myalgia, other site: Secondary | ICD-10-CM

## 2019-02-24 DIAGNOSIS — M25511 Pain in right shoulder: Secondary | ICD-10-CM | POA: Diagnosis not present

## 2019-02-24 DIAGNOSIS — G894 Chronic pain syndrome: Secondary | ICD-10-CM | POA: Diagnosis not present

## 2019-02-24 DIAGNOSIS — M542 Cervicalgia: Secondary | ICD-10-CM | POA: Diagnosis not present

## 2019-02-24 DIAGNOSIS — M47812 Spondylosis without myelopathy or radiculopathy, cervical region: Secondary | ICD-10-CM

## 2019-02-24 NOTE — Progress Notes (Signed)
Pain Management Virtual Encounter Note - Virtual Visit via Scurry (real-time audio visits between healthcare provider and patient).   Patient's Phone No. & Preferred Pharmacy:  [phone] (home); [phone] (mobile); (Preferred) [phone] [email or web address]  [id number] [address], [place] [address] Phone: [phone] Fax: [phone]    Pre-screening note:  Our staff contacted Mr. Hassebrock and offered him an "in person", "face-to-face" appointment versus a telephone encounter. He indicated preferring the telephone encounter, at this time.   Reason for Virtual Visit: COVID-19*  Social distancing based on CDC and AMA recommendations.   I contacted Pilar Jarvis on 02/24/2019 via video conference.      I clearly identified myself as Gillis Santa, MD. I verified that I was speaking with the correct person using two identifiers (Name: ADARRYLL LARRIMORE, and date of birth: Dec 02, 1960).  Advanced Informed Consent I sought verbal advanced consent from Pilar Jarvis for virtual visit interactions. I informed Mr. Rayas of possible security and privacy concerns, risks, and limitations associated with providing "not-in-person" medical evaluation and management services. I also informed Mr. Basha of the availability of "in-person" appointments. Finally, I informed him that there would be a charge for the virtual visit and that he could be  personally, fully or partially, financially responsible for it. Mr. Petrossian expressed understanding and agreed to proceed.   Historic Elements   Mr. LANNIE MOGUL is a 58 y.o. year old, male patient evaluated today after his last encounter by our practice on 02/23/2019. Mr. Hardaway  has a past medical history of Asthma, Diabetes mellitus without complication ([place]), and Hypertension. He also  has a past surgical history that includes Lung surgery; Cholecystectomy; and Colonoscopy with propofol  (N/A, 11/18/2018). Mr. Grzeskowiak has a current medication list which includes the following prescription(s): amlodipine, celecoxib, diazepam, gabapentin, glipizide, losartan, metformin, metoprolol tartrate, omega-3 fatty acids, vitamin d (ergocalciferol), and vitamin e. He  reports that he has never smoked. He has never used smokeless tobacco. He reports that he does not drink alcohol or use drugs. Mr. Lundwall is allergic to other and morphine and related.   HPI  Today, he is being contacted for a post-procedure assessment.  Evaluation of last interventional procedure  02/05/2019 Procedure: Left C3, C4, C5 radiofrequency ablation 01/13/2019, right C3, C4, C5 radiofrequency ablation on 12/16/2018.   Sedation: Please see nurses note for DOS. When no sedatives are used, the analgesic levels obtained are directly associated to the effectiveness of the local anesthetics. However, when sedation is provided, the level of analgesia obtained during the initial 1 hour following the intervention, is believed to be the result of a combination of factors. These factors may include, but are not limited to: 1. The effectiveness of the local anesthetics used. 2. The effects of the analgesic(s) and/or anxiolytic(s) used. 3. The degree of discomfort experienced by the patient at the time of the procedure. 4. The patients ability and reliability in recalling and recording the events. 5. The presence and influence of possible secondary gains and/or psychosocial factors. Reported result: Relief experienced during the 1st hour after the procedure: 100 % (Ultra-Short Term Relief)            Interpretative annotation: Clinically appropriate result. Analgesia during this period is likely to be Local Anesthetic and/or IV Sedative (Analgesic/Anxiolytic) related.          Effects of local anesthetic: The analgesic effects attained during this period are directly associated to  the localized infiltration of local anesthetics and  therefore cary significant diagnostic value as to the etiological location, or anatomical origin, of the pain. Expected duration of relief is directly dependent on the pharmacodynamics of the local anesthetic used. Long-acting (4-6 hours) anesthetics used.  Reported result: Relief during the next 4 to 6 hour after the procedure: 100 % (Short-Term Relief)            Interpretative annotation: Clinically appropriate result. Analgesia during this period is likely to be Local Anesthetic-related.          Long-term benefit: Defined as the period of time past the expected duration of local anesthetics (1 hour for short-acting and 4-6 hours for long-acting). With the possible exception of prolonged sympathetic blockade from the local anesthetics, benefits during this period are typically attributed to, or associated with, other factors such as analgesic sensory neuropraxia, antiinflammatory effects, or beneficial biochemical changes provided by agents other than the local anesthetics.  Reported result: Extended relief following procedure: 100 % (Long-Term Relief)            Interpretative annotation: Clinically appropriate result. Good relief. Therapeutic success.    Benefit could signal adequate RF ablation.  Successful bilateral cervical radiofrequency ablation.  Laboratory Chemistry Profile (12 mo)  Renal: No results found for requested labs within last 8760 hours.  Lab Results  Component Value Date   GFRAA >60 09/24/2017   GFRNONAA >60 09/24/2017   Hepatic: No results found for requested labs within last 8760 hours. Lab Results  Component Value Date   AST 18 09/24/2017   ALT 24 09/24/2017   Other: No results found for requested labs within last 8760 hours. Note: Above Lab results reviewed.  Imaging  Last 90 days:  Dg Pain Clinic C-arm 1-60 Min No Report  Result Date: 01/13/2019 Fluoro was used, but no Radiologist interpretation will be provided. Please refer to "NOTES" tab for provider  progress note.  Dg Pain Clinic C-arm 1-60 Min No Report  Result Date: 12/16/2018 Fluoro was used, but no Radiologist interpretation will be provided. Please refer to "NOTES" tab for provider progress note.   Assessment  The primary encounter diagnosis was Cervical facet joint syndrome (C2,3,4,5). Diagnoses of Arthropathy of cervical facet joint, Cervicalgia, Chronic pain syndrome, Chronic right shoulder pain, and Chronic myofascial pain were also pertinent to this visit.  Plan of Care  I am having Pilar Jarvis maintain his metFORMIN, gabapentin, amLODipine, losartan, metoprolol tartrate, glipiZIDE, Omega-3 Fatty Acids (FISH OIL PO), vitamin E, Vitamin D (Ergocalciferol), celecoxib, and diazepam.  Virtual follow-up status post right and left C3, C4, C5 radiofrequency ablation.  Patient states that he is noticing moderate to significant benefit in regards to his neck pain after the ablation.  Intermittently, he will have severe right shoulder pain.  This comes upon spontaneously without any inciting event or a specific movement.  No recent right shoulder x-ray.  Will obtain right x-ray and plan for right sided trapezius and periscapular trigger point injections.  Risks and benefits reviewed and patient would like to proceed.  Orders Placed This Encounter  Procedures  . MNB (Schedule)    Standing Status:   Future    Standing Expiration Date:   03/26/2019    Scheduling Instructions:     Right trapezeius, cervical w/o sed    Order Specific Question:   Where will this procedure be performed?    Answer:   [facility]  . DG Shoulder Right    Standing Status:  Future    Standing Expiration Date:   04/25/2020    Order Specific Question:   Reason for Exam (SYMPTOM  OR DIAGNOSIS REQUIRED)    Answer:   Right shoulder pain    Order Specific Question:   Preferred imaging location?    Answer:   [facility]    Order Specific Question:   Call Results- Best Contact Number?    AnswerZN:[phone]     Follow-up plan:   Return in about 2 weeks (around 03/10/2019) for TPI (right trap).     Status post right C3, C4, C5 RFA on 12/16/2018, left 01/13/2019.    Recent Visits Date Type Provider Dept  01/13/19 Procedure visit Gillis Santa, MD [facility]  12/16/18 Procedure visit Gillis Santa, MD [facility]  Showing recent visits within past 90 days and meeting all other requirements   Today's Visits Date Type Provider Dept  02/24/19 Office Visit Gillis Santa, MD [facility]  Showing today's visits and meeting all other requirements   Future Appointments No visits were found meeting these conditions.  Showing future appointments within next 90 days and meeting all other requirements   I discussed the assessment and treatment plan with the patient. The patient was provided an opportunity to ask questions and all were answered. The patient agreed with the plan and demonstrated an understanding of the instructions.  Patient advised to call back or seek an in-person evaluation if the symptoms or condition worsens.  Total duration of non-face-to-face encounter: 25 minutes.  Note by: Gillis Santa, MD Date: 02/24/2019; Time: 2:21 PM  Note: This dictation was prepared with Dragon dictation. Any transcriptional errors that may result from this process are unintentional.  Disclaimer:  * Given the special circumstances of the COVID-19 pandemic, the federal government has announced that the Office for Civil Rights (OCR) will exercise its enforcement discretion and will not impose penalties on physicians using telehealth in the event of noncompliance with regulatory requirements under the [place] and [place] (HIPAA) in connection with the good faith provision of telehealth during the XX123456 national public health emergency. ([place])

## 2019-03-05 ENCOUNTER — Ambulatory Visit
Admission: RE | Admit: 2019-03-05 | Discharge: 2019-03-05 | Disposition: A | Discharge status: Home / Self Care | Payer: BC Managed Care – PPO | Source: Ambulatory Visit | Attending: Student in an Organized Health Care Education/Training Program | Admitting: Student in an Organized Health Care Education/Training Program

## 2019-03-05 DIAGNOSIS — M25511 Pain in right shoulder: Secondary | ICD-10-CM | POA: Diagnosis not present

## 2019-03-05 DIAGNOSIS — G8929 Other chronic pain: Secondary | ICD-10-CM | POA: Diagnosis present

## 2019-03-08 NOTE — Progress Notes (Signed)
Xray of shoulder was negative Tony Benson

## 2019-03-10 ENCOUNTER — Ambulatory Visit
Payer: BC Managed Care – PPO | Attending: Student in an Organized Health Care Education/Training Program | Admitting: Student in an Organized Health Care Education/Training Program

## 2019-03-10 ENCOUNTER — Other Ambulatory Visit: Payer: Self-pay

## 2019-03-10 ENCOUNTER — Encounter: Payer: Self-pay | Admitting: Student in an Organized Health Care Education/Training Program

## 2019-03-10 VITALS — BP 156/104 | HR 78 | Temp 97.2°F | Resp 16 | Ht 70.0 in | Wt 210.0 lb

## 2019-03-10 DIAGNOSIS — M7918 Myalgia, other site: Secondary | ICD-10-CM | POA: Diagnosis present

## 2019-03-10 DIAGNOSIS — G8929 Other chronic pain: Secondary | ICD-10-CM | POA: Diagnosis present

## 2019-03-10 MED ORDER — ROPIVACAINE HCL 2 MG/ML IJ SOLN
9.0000 mL | Freq: Once | INTRAMUSCULAR | Status: AC
  Administered 2019-03-10: 10 mL

## 2019-03-10 MED ORDER — ROPIVACAINE HCL 2 MG/ML IJ SOLN
9.0000 mL | Freq: Once | INTRAMUSCULAR | Status: AC
  Administered 2019-03-10: 10:00:00 10 mL

## 2019-03-10 MED ORDER — ROPIVACAINE HCL 2 MG/ML IJ SOLN
INTRAMUSCULAR | Status: AC
  Filled 2019-03-10: qty 20

## 2019-03-10 NOTE — Patient Instructions (Signed)
Pain Management Discharge Instructions  General Discharge Instructions :  If you need to reach your doctor call: Monday-Friday 8:00 am - 4:00 pm at [phone] or toll free [phone].  After clinic hours [phone] to have operator reach doctor.  Bring all of your medication bottles to all your appointments in the pain clinic.  To cancel or reschedule your appointment with Pain Management please remember to call 24 hours in advance to avoid a fee.  Refer to the educational materials which you have been given on: General Risks, I had my Procedure. Discharge Instructions, Post Sedation.  Post Procedure Instructions:  The drugs you were given will stay in your system until tomorrow, so for the next 24 hours you should not drive, make any legal decisions or drink any alcoholic beverages.  You may eat anything you prefer, but it is better to start with liquids then soups and crackers, and gradually work up to solid foods.  Please notify your doctor immediately if you have any unusual bleeding, trouble breathing or pain that is not related to your normal pain.  Depending on the type of procedure that was done, some parts of your body may feel week and/or numb.  This usually clears up by tonight or the next day.  Walk with the use of an assistive device or accompanied by an adult for the 24 hours.  You may use ice on the affected area for the first 24 hours.  Put ice in a Ziploc bag and cover with a towel and place against area 15 minutes on 15 minutes off.  You may switch to heat after 24 hours.Trigger Point Injection Trigger points are areas where you have pain. A trigger point injection is a shot given in the trigger point to help relieve pain for a few days to a few months. Common places for trigger points include:  The neck.  The shoulders.  The upper back.  The lower back. A trigger point injection will not cure long-term (chronic) pain permanently. These injections do not  always work for every person. For some people, they can help to relieve pain for a few days to a few months. Tell a health care provider about:  Any allergies you have.  All medicines you are taking, including vitamins, herbs, eye drops, creams, and over-the-counter medicines.  Any problems you or family members have had with anesthetic medicines.  Any blood disorders you have.  Any surgeries you have had.  Any medical conditions you have. What are the risks? Generally, this is a safe procedure. However, problems may occur, including:  Infection.  Bleeding or bruising.  Allergic reaction to the injected medicine.  Irritation of the skin around the injection site. What happens before the procedure? Ask your health care provider about:  Changing or stopping your regular medicines. This is especially important if you are taking diabetes medicines or blood thinners.  Taking medicines such as aspirin and ibuprofen. These medicines can thin your blood. Do not take these medicines unless your health care provider tells you to take them.  Taking over-the-counter medicines, vitamins, herbs, and supplements. What happens during the procedure?   Your health care provider will feel for trigger points. A marker may be used to circle the area for the injection.  The skin over the trigger point will be washed with a germ-killing (antiseptic) solution.  A thin needle is used for the injection. You may feel pain or a twitching feeling when the needle enters the trigger point.    A numbing solution may be injected into the trigger point. Sometimes a medicine to keep down inflammation is also injected.  Your health care provider may move the needle around the area where the trigger point is located until the tightness and twitching goes away.  After the injection, your health care provider may put gentle pressure over the injection site.  The injection site will be covered with a bandage  (dressing). The procedure may vary among health care providers and hospitals. What can I expect after treatment? After treatment, you may have:  Soreness and stiffness for 1-2 days.  A dressing. This can be taken off in a few hours or as told by your health care provider. Follow these instructions at home: Injection site care  Remove your dressing as told by your health care provider.  Check your injection site every day for signs of infection. Check for: ? Redness, swelling, or pain. ? Fluid or blood. ? Warmth. ? Pus or a bad smell. Managing pain, stiffness, and swelling  If directed, put ice on the affected area. ? Put ice in a plastic bag. ? Place a towel between your skin and the bag. ? Leave the ice on for 20 minutes, 2-3 times a day. General instructions  If you were asked to stop your regular medicines, ask your health care provider when you may start taking them again.  Return to your normal activities as told by your health care provider. Ask your health care provider what activities are safe for you.  Do not take baths, swim, or use a hot tub until your health care provider approves.  You may be asked to see an occupational or physical therapist for exercises that reduce muscle strain and stretch the area of the trigger point.  Keep all follow-up visits as told by your health care provider. This is important. Contact a health care provider if:  Your pain comes back, and it is worse than before the injection. You may need more injections.  You have chills or a fever.  The injection site becomes more painful, red, swollen, or warm to the touch. Summary  A trigger point injection is a shot given in the trigger point to help relieve pain for a few days to a few months.  Common places for trigger point injections are the neck, shoulder, upper back, and lower back.  These injections do not always work for every person, but for some people, the injections can help  to relieve pain for a few days to a few months.  Contact a health care provider if symptoms come back or they are worse than before treatment. Also, get help if the injection site becomes more painful, red, swollen, or warm to the touch. This information is not intended to replace advice given to you by your health care provider. Make sure you discuss any questions you have with your health care provider. Document Released: 03/21/2011 Document Revised: 05/13/2018 Document Reviewed: 05/13/2018 Elsevier Patient Education  2020 [employer].

## 2019-03-10 NOTE — Progress Notes (Signed)
Safety precautions to be maintained throughout the outpatient stay will include: orient to surroundings, keep bed in low position, maintain call bell within reach at all times, provide assistance with transfer out of bed and ambulation.  

## 2019-03-10 NOTE — Progress Notes (Signed)
Patient's Name: Tony Benson  MRN: [id number]  Referring Provider: Casilda Carls, MD  DOB: October 22, 1960  PCP: Casilda Carls, MD  DOS: 03/10/2019  Note by: Gillis Santa, MD  Service setting: Ambulatory outpatient  Specialty: Interventional Pain Management  Patient type: Established  Location: [facility] (AMB) Pain Management Facility  Visit type: Interventional Procedure   Primary Reason for Visit: Interventional Pain Management Treatment. CC: Shoulder Pain  Procedure:          Anesthesia, Analgesia, Anxiolysis:  Type: Trigger Point Injection (3+) #1  CPT: 20553 Right cervical myofascial pain syndrome Injection of right trapezius, levator scapulae, rhomboid minor, rhomboid major   Type: Local Anesthesia Indication(s): Analgesia         Local Anesthetic: Lidocaine 1-2% Route: Infiltration ([place]/IM) IV Access: Declined Sedation: Declined   Position: Sitting   Indications: 1. Chronic myofascial pain    Pain Score: Pre-procedure: 8 /10 Post-procedure: 4 /10   Pre-op Assessment:  Tony Benson is a 58 y.o. (year old), male patient, seen today for interventional treatment. He  has a past surgical history that includes Lung surgery; Cholecystectomy; and Colonoscopy with propofol (N/A, 11/18/2018). Tony Benson has a current medication list which includes the following prescription(s): amlodipine, celecoxib, diazepam, gabapentin, glipizide, losartan, metformin, metoprolol tartrate, omega-3 fatty acids, vitamin d (ergocalciferol), and vitamin e. His primarily concern today is the Shoulder Pain  Initial Vital Signs:  Pulse/HCG Rate: 78ECG Heart Rate: 70 Temp: (!) 97.2 F (36.2 C) Resp: 16 BP: (!) 165/107 SpO2: 99 %  BMI: Estimated body mass index is 30.13 kg/m as calculated from the following:   Height as of this encounter: 5\' 10"  (1.778 m).   Weight as of this encounter: 210 lb (95.3 kg).  Risk Assessment: Allergies: Reviewed. He is allergic to other and morphine and related.  Allergy  Precautions: None required Coagulopathies: Reviewed. None identified.  Blood-thinner therapy: None at this time Active Infection(s): Reviewed. None identified. Tony Benson is afebrile  Site Confirmation: Tony Benson was asked to confirm the procedure and laterality before marking the site Procedure checklist: Completed Consent: Before the procedure and under the influence of no sedative(s), amnesic(s), or anxiolytics, the patient was informed of the treatment options, risks and possible complications. To fulfill our ethical and legal obligations, as recommended by the American Medical Association's Code of Ethics, I have informed the patient of my clinical impression; the nature and purpose of the treatment or procedure; the risks, benefits, and possible complications of the intervention; the alternatives, including doing nothing; the risk(s) and benefit(s) of the alternative treatment(s) or procedure(s); and the risk(s) and benefit(s) of doing nothing. The patient was provided information about the general risks and possible complications associated with the procedure. These may include, but are not limited to: failure to achieve desired goals, infection, bleeding, organ or nerve damage, allergic reactions, paralysis, and death. In addition, the patient was informed of those risks and complications associated to the procedure, such as failure to decrease pain; infection; bleeding; organ or nerve damage with subsequent damage to sensory, motor, and/or autonomic systems, resulting in permanent pain, numbness, and/or weakness of one or several areas of the body; allergic reactions; (i.e.: anaphylactic reaction); and/or death. Furthermore, the patient was informed of those risks and complications associated with the medications. These include, but are not limited to: allergic reactions (i.e.: anaphylactic or anaphylactoid reaction(s)); adrenal axis suppression; blood sugar elevation that in diabetics may result  in ketoacidosis or comma; water retention that in patients with history of congestive heart  failure may result in shortness of breath, pulmonary edema, and decompensation with resultant heart failure; weight gain; swelling or edema; medication-induced neural toxicity; particulate matter embolism and blood vessel occlusion with resultant organ, and/or nervous system infarction; and/or aseptic necrosis of one or more joints. Finally, the patient was informed that Medicine is not an exact science; therefore, there is also the possibility of unforeseen or unpredictable risks and/or possible complications that may result in a catastrophic outcome. The patient indicated having understood very clearly. We have given the patient no guarantees and we have made no promises. Enough time was given to the patient to ask questions, all of which were answered to the patient's satisfaction. Tony Benson has indicated that he wanted to continue with the procedure. Attestation: I, the ordering provider, attest that I have discussed with the patient the benefits, risks, side-effects, alternatives, likelihood of achieving goals, and potential problems during recovery for the procedure that I have provided informed consent. Date  Time: 03/10/2019  8:53 AM  Pre-Procedure Preparation:  Monitoring: As per clinic protocol. Respiration, ETCO2, SpO2, BP, heart rate and rhythm monitor placed and checked for adequate function Safety Precautions: Patient was assessed for positional comfort and pressure points before starting the procedure. Time-out: I initiated and conducted the "Time-out" before starting the procedure, as per protocol. The patient was asked to participate by confirming the accuracy of the "Time Out" information. Verification of the correct person, site, and procedure were performed and confirmed by me, the nursing staff, and the patient. "Time-out" conducted as per Joint Commission's Universal Protocol  (UP.01.01.01). Time: 0930  Description of Procedure:          Area Prepped: Entire RIGHT cervical, thoracic region Prepping solution: DuraPrep (Iodine Povacrylex [0.7% available iodine] and Isopropyl Alcohol, 74% w/w) Safety Precautions: Aspiration looking for blood return was conducted prior to all injections. At no point did we inject any substances, as a needle was being advanced. No attempts were made at seeking any paresthesias. Safe injection practices and needle disposal techniques used. Medications properly checked for expiration dates. SDV (single dose vial) medications used. Description of the Procedure: Protocol guidelines were followed. The patient was placed in position over the fluoroscopy table. The target area was identified and the area prepped in the usual manner. Skin & deeper tissues infiltrated with local anesthetic. Appropriate amount of time allowed to pass for local anesthetics to take effect. The procedure needles were then advanced to the target area. Proper needle placement secured. Negative aspiration confirmed. Solution injected in intermittent fashion, asking for systemic symptoms every 0.5cc of injectate. The needles were then removed and the area cleansed, making sure to leave some of the prepping solution back to take advantage of its long term bactericidal properties.  Vitals:   03/10/19 0859 03/10/19 0938  BP: (!) 165/107 (!) 156/104  Pulse: 78   Resp:  16  Temp: (!) 97.2 F (36.2 C)   SpO2: 99% 99%  Weight: 210 lb (95.3 kg)   Height: 5\' 10"  (1.778 m)     Start Time: 0930 hrs. End Time: 0936 hrs. Materials:  Needle(s) Type: Regular needle Gauge: 25G Length: 1.5-in Medication(s): Please see orders for medications and dosing details. Approximately 18 trigger points injected with 0.5 to 1 cc of 0.2% ropivacaine along with dry needling performed Imaging Guidance:          Type of Imaging Technique: None used Indication(s): N/A Exposure Time: No patient  exposure Contrast: None used. Fluoroscopic Guidance: N/A Ultrasound Guidance: N/A  Interpretation: N/A  Antibiotic Prophylaxis:   Anti-infectives (From admission, onward)   None     Indication(s): None identified  Post-operative Assessment:  Post-procedure Vital Signs:  Pulse/HCG Rate: 7870 Temp: (!) 97.2 F (36.2 C) Resp: 16 BP: (!) 156/104 SpO2: 99 %  EBL: None  Complications: No immediate post-treatment complications observed by team, or reported by patient.  Note: The patient tolerated the entire procedure well. A repeat set of vitals were taken after the procedure and the patient was kept under observation following institutional policy, for this type of procedure. Post-procedural neurological assessment was performed, showing return to baseline, prior to discharge. The patient was provided with post-procedure discharge instructions, including a section on how to identify potential problems. Should any problems arise concerning this procedure, the patient was given instructions to immediately contact us, at any time, without hesitation. In any case, we plan to contact the patient by telephone for a follow-up status report regarding this interventional procedure.  Comments:  No additional relevant information.  Plan of Care  Orders:  Orders Placed This Encounter  Procedures  . MNB (PRN)    Standing Status:   Standing    Number of Occurrences:   10    Standing Expiration Date:   03/09/2020    Scheduling Instructions:     Right trap    Order Specific Question:   Where will this procedure be performed?    Answer:   [facility]    Medications ordered for procedure: Meds ordered this encounter  Medications  . ropivacaine (PF) 2 mg/mL (0.2%) (NAROPIN) injection 9 mL  . ropivacaine (PF) 2 mg/mL (0.2%) (NAROPIN) injection 9 mL   Medications administered: We administered ropivacaine (PF) 2 mg/mL (0.2%) and ropivacaine (PF) 2 mg/mL (0.2%).  See the medical  record for exact dosing, route, and time of administration.  Follow-up plan:   Return if symptoms worsen or fail to improve.      Status post right C3, C4, C5 RFA on 12/16/2018, left 01/13/2019.     Recent Visits Date Type Provider Dept  02/24/19 Office Visit Gillis Santa, MD [facility]  01/13/19 Procedure visit Gillis Santa, MD [facility]  12/16/18 Procedure visit Gillis Santa, MD [facility]  Showing recent visits within past 90 days and meeting all other requirements   Today's Visits Date Type Provider Dept  03/10/19 Procedure visit Gillis Santa, MD [facility]  Showing today's visits and meeting all other requirements   Future Appointments No visits were found meeting these conditions.  Showing future appointments within next 90 days and meeting all other requirements   Disposition: Discharge home  Discharge Date & Time: 03/10/2019; 0950 hrs.   Primary Care Physician: Casilda Carls, MD Location: [facility] Note by: Gillis Santa, MD Date: 03/10/2019; Time: 10:26 AM  Disclaimer:  Medicine is not an exact science. The only guarantee in medicine is that nothing is guaranteed. It is important to note that the decision to proceed with this intervention was based on the information collected from the patient. The Data and conclusions were drawn from the patient's questionnaire, the interview, and the physical examination. Because the information was provided in large part by the patient, it cannot be guaranteed that it has not been purposely or unconsciously manipulated. Every effort has been made to obtain as much relevant data as possible for this evaluation. It is important to note that the conclusions that lead to this procedure are derived in large  part from the available data. Always take into account that the treatment will also be dependent on availability of resources and existing treatment guidelines,  considered by other Pain Management Practitioners as being common knowledge and practice, at the time of the intervention. For Medico-Legal purposes, it is also important to point out that variation in procedural techniques and pharmacological choices are the acceptable norm. The indications, contraindications, technique, and results of the above procedure should only be interpreted and judged by a Board-Certified Interventional Pain Specialist with extensive familiarity and expertise in the same exact procedure and technique.

## 2019-04-27 ENCOUNTER — Telehealth: Payer: Self-pay | Admitting: *Deleted

## 2019-04-27 ENCOUNTER — Encounter: Payer: Self-pay | Admitting: Student in an Organized Health Care Education/Training Program

## 2019-04-27 NOTE — Telephone Encounter (Signed)
Instructed patient per voicemail to call our office to make appointment, per orders from last visit.

## 2019-04-28 ENCOUNTER — Ambulatory Visit
Payer: BC Managed Care – PPO | Attending: Student in an Organized Health Care Education/Training Program | Admitting: Student in an Organized Health Care Education/Training Program

## 2019-04-28 ENCOUNTER — Other Ambulatory Visit: Payer: Self-pay

## 2019-04-28 ENCOUNTER — Encounter: Payer: Self-pay | Admitting: Student in an Organized Health Care Education/Training Program

## 2019-04-28 DIAGNOSIS — M47812 Spondylosis without myelopathy or radiculopathy, cervical region: Secondary | ICD-10-CM | POA: Diagnosis not present

## 2019-04-28 DIAGNOSIS — G8929 Other chronic pain: Secondary | ICD-10-CM

## 2019-04-28 DIAGNOSIS — M7918 Myalgia, other site: Secondary | ICD-10-CM

## 2019-04-28 DIAGNOSIS — G894 Chronic pain syndrome: Secondary | ICD-10-CM

## 2019-04-28 MED ORDER — PREDNISONE 20 MG PO TABS: ORAL_TABLET | ORAL | 0 refills | Status: AC

## 2019-04-28 NOTE — Progress Notes (Signed)
Patient: Tony Benson  Service Category: E/M  Provider: Gillis Santa, MD  DOB: [birth year]  DOS: 04/28/2019  Location: Office  MRN: [id number]  Setting: Ambulatory outpatient  Referring Provider: Casilda Carls, MD  Type: Established Patient  Specialty: Interventional Pain Management  PCP: Casilda Carls, MD  Location: Home  Delivery: TeleHealth     Virtual Encounter - Pain Management PROVIDER NOTE: Information contained herein reflects review and annotations entered in association with encounter. Interpretation of such information and data should be left to medically-trained personnel. Information provided to patient can be located elsewhere in the medical record under "Patient Instructions". Document created using STT-dictation technology, any transcriptional errors that may result from process are unintentional.    Contact & Pharmacy Preferred: [place]: [phone] (home) Mobile: [phone] (mobile) E-mail: [email or web address]  [id number] [address], [place] [address] Phone: [phone] Fax: [phone]   Pre-screening  Tony Benson offered "in-person" vs "virtual" encounter. He indicated preferring virtual for this encounter.   Reason COVID-19*  Social distancing based on CDC and AMA recommendations.   I contacted Tony Benson on 04/28/2019 via telephone.      I clearly identified myself as Gillis Santa, MD. I verified that I was speaking with the correct person using two identifiers (Name: Tony Benson, and date of birth: 1960-07-01).  This visit was completed via telephone due to the restrictions of the COVID-19 pandemic. All issues as above were discussed and addressed but no physical exam was performed. If it was felt that the patient should be evaluated in the office, they were directed there. The patient verbally consented to this visit. Patient was unable to complete an audio/visual visit due to Technical difficulties  and/or Lack of internet. Due to the catastrophic nature of the COVID-19 pandemic, this visit was done through audio contact only.  Location of the patient: home address (see Epic for details)  Location of the provider: office  Consent I sought verbal advanced consent from Tony Benson for virtual visit interactions. I informed Mr. Windon of possible security and privacy concerns, risks, and limitations associated with providing "not-in-person" medical evaluation and management services. I also informed Mr. Mitra of the availability of "in-person" appointments. Finally, I informed him that there would be a charge for the virtual visit and that he could be  personally, fully or partially, financially responsible for it. Mr. Dhaliwal expressed understanding and agreed to proceed.   Historic Elements   Tony Benson is a 59 y.o. year old, male patient evaluated today after his last encounter by our practice on 04/27/2019. Tony Benson  has a past medical history of Asthma, Diabetes mellitus without complication ([place]), and Hypertension. He also  has a past surgical history that includes Lung surgery; Cholecystectomy; and Colonoscopy with propofol (N/A, 11/18/2018). Tony Benson has a current medication list which includes the following prescription(s): amlodipine, celecoxib, diazepam, gabapentin, glipizide, losartan, metformin, metoprolol tartrate, omega-3 fatty acids, vitamin d (ergocalciferol), vitamin e, and prednisone. He  reports that he has never smoked. He has never used smokeless tobacco. He reports that he does not drink alcohol or use drugs. Tony Benson is allergic to other and morphine and related.   HPI  Today, he is being contacted for worsening of previously known (established) problem  Patient is endorsing neck and shoulder discomfort.  Denies any vision changes.  Occasional headaches.  No change with position.  He states that has become more apparent and  frequent.  Patient is status post left  C3, C4, C5 RFA on 01/13/2019 and right C3, C4, C5 RFA on 12/16/2018.  Patient could be having cervical medial branch nerve regeneration that could be contributing to his increased pain.  Discussed repeating cervical facet medial branch nerve blocks and holding off on the ablation for time being.   We Benson also prescribe steroid taper as below. Previous steroid taper Rx by me was in September 2020. No PO steroids since per pt.   Laboratory Chemistry Profile (12 mo)  Renal: No results found for requested labs within last 8760 hours.  Lab Results  Component Value Date   GFRAA >60 09/24/2017   GFRNONAA >60 09/24/2017   Hepatic: No results found for requested labs within last 8760 hours. Lab Results  Component Value Date   AST 18 09/24/2017   ALT 24 09/24/2017   Other: No results found for requested labs within last 8760 hours. Note: Above Lab results reviewed.  Imaging  DG Shoulder Right CLINICAL DATA:  Right shoulder pain.  Decreased range of motion.  EXAM: RIGHT SHOULDER - 2+ VIEW  COMPARISON:  None.  FINDINGS: There is no evidence of fracture or dislocation. There is no evidence of arthropathy or other focal bone abnormality. Soft tissues are unremarkable.  IMPRESSION: Negative radiographs of the right shoulder.  Electronically Signed   By: Keith Rake M.D.   On: 03/06/2019 03:16   Assessment  The primary encounter diagnosis was Cervical facet joint syndrome (C2,3,4,5). Diagnoses of Arthropathy of cervical facet joint, Chronic myofascial pain, and Chronic pain syndrome were also pertinent to this visit.  Plan of Care  I am having Tony Benson start on predniSONE. I am also having him maintain his metFORMIN, gabapentin, amLODipine, losartan, metoprolol tartrate, glipiZIDE, Omega-3 Fatty Acids (FISH OIL PO), vitamin E, Vitamin D (Ergocalciferol), celecoxib, and diazepam.  Pharmacotherapy (Medications Ordered): Meds ordered this encounter  Medications  . predniSONE  (DELTASONE) 20 MG tablet    Sig: Take 3 tablets (60 mg total) by mouth daily with breakfast for 3 days, THEN 2 tablets (40 mg total) daily with breakfast for 3 days, THEN 1 tablet (20 mg total) daily with breakfast for 3 days.    Dispense:  18 tablet    Refill:  0   Orders:  Orders Placed This Encounter  Procedures  . C-FCT Blk (Schedule)    Standing Status:   Future    Standing Expiration Date:   05/29/2019    Scheduling Instructions:     Side: Bilateral     Level: C3-4, C4-5, Facet joints (C3, C4, C5,  Medial Branch Nerves)     Sedation: With Sedation.     Timeframe: 2 weeks    Order Specific Question:   Where Benson this procedure be performed?    Answer:   [facility]   Follow-up plan:   Return in about 12 days (around 05/10/2019) for B/L C3, 4, 5 Fcts , with sedation.     Status post right C3, C4, C5 RFA on 12/16/2018, left 01/13/2019.      Recent Visits Date Type Provider Dept  03/10/19 Procedure visit Gillis Santa, MD [facility]  02/24/19 Office Visit Gillis Santa, MD [facility]  Showing recent visits within past 90 days and meeting all other requirements   Today's Visits Date Type Provider Dept  04/28/19 Office Visit Gillis Santa, MD [facility]  Showing today's visits and meeting all other requirements   Future Appointments  No visits were found meeting these conditions.  Showing future appointments within next 90 days and meeting all other requirements   I discussed the assessment and treatment plan with the patient. The patient was provided an opportunity to ask questions and all were answered. The patient agreed with the plan and demonstrated an understanding of the instructions.  Patient advised to call back or seek an in-person evaluation if the symptoms or condition worsens.  Total duration of non-face-to-face encounter: 30 minutes.  Note by: Gillis Santa, MD Date: 04/28/2019; Time: 1:35 PM

## 2019-05-10 ENCOUNTER — Other Ambulatory Visit: Payer: Self-pay

## 2019-05-10 ENCOUNTER — Encounter: Payer: Self-pay | Admitting: Student in an Organized Health Care Education/Training Program

## 2019-05-10 ENCOUNTER — Ambulatory Visit (HOSPITAL_BASED_OUTPATIENT_CLINIC_OR_DEPARTMENT_OTHER): Payer: BC Managed Care – PPO | Admitting: Student in an Organized Health Care Education/Training Program

## 2019-05-10 ENCOUNTER — Ambulatory Visit
Admission: RE | Admit: 2019-05-10 | Discharge: 2019-05-10 | Disposition: A | Discharge status: Home / Self Care | Payer: BC Managed Care – PPO | Source: Ambulatory Visit | Attending: Student in an Organized Health Care Education/Training Program | Admitting: Student in an Organized Health Care Education/Training Program

## 2019-05-10 VITALS — BP 174/93 | HR 76 | Resp 16

## 2019-05-10 DIAGNOSIS — M47812 Spondylosis without myelopathy or radiculopathy, cervical region: Secondary | ICD-10-CM | POA: Insufficient documentation

## 2019-05-10 MED ORDER — DEXAMETHASONE SODIUM PHOSPHATE 10 MG/ML IJ SOLN
10.0000 mg | Freq: Once | INTRAMUSCULAR | Status: AC
  Administered 2019-05-10: 10:00:00 10 mg
  Filled 2019-05-10: qty 1

## 2019-05-10 MED ORDER — LIDOCAINE HCL 2 % IJ SOLN
20.0000 mL | Freq: Once | INTRAMUSCULAR | Status: AC
  Administered 2019-05-10: 10:00:00 400 mg
  Filled 2019-05-10: qty 20

## 2019-05-10 MED ORDER — ROPIVACAINE HCL 2 MG/ML IJ SOLN
2.0000 mL | Freq: Once | INTRAMUSCULAR | Status: AC
  Administered 2019-05-10: 10:00:00 2 mL via EPIDURAL
  Filled 2019-05-10: qty 10

## 2019-05-10 MED ORDER — MIDAZOLAM HCL 5 MG/5ML IJ SOLN
1.0000 mg | INTRAMUSCULAR | Status: DC | PRN
  Administered 2019-05-10: 10:00:00 0.5 mg via INTRAVENOUS
  Filled 2019-05-10: qty 5

## 2019-05-10 MED ORDER — FENTANYL CITRATE (PF) 100 MCG/2ML IJ SOLN
25.0000 ug | INTRAMUSCULAR | Status: AC | PRN
  Administered 2019-05-10 (×2): 25 ug via INTRAVENOUS
  Filled 2019-05-10: qty 2

## 2019-05-10 NOTE — Progress Notes (Signed)
Patient's Name: Tony Benson  MRN: [id number]  Referring Provider: Casilda Carls, MD  DOB: Oct 24, 1960  PCP: Casilda Carls, MD  DOS: 05/10/2019  Note by: Gillis Santa, MD  Service setting: Ambulatory outpatient  Specialty: Interventional Pain Management  Patient type: Established  Location: [facility] (AMB) Pain Management Facility  Visit type: Interventional Procedure   Primary Reason for Visit: Interventional Pain Management Treatment. CC: Neck Pain (right )  Procedure:          Anesthesia, Analgesia, Anxiolysis:  Type: Cervical Facet Medial Branch Block(s)  #1 (in 2021; s/p R C3,4,5 RFA on 12/16/2018. L C3,4,5 RFA on 01/13/2019 ) Primary Purpose: Therapeutic Region: Posterolateral cervical spine Level: C3, C4, C5,Medial Branch Level(s). Injecting these levels blocks the C3-4, C4-5, cervical facet joints. Laterality: Bilateral  Type: Moderate (Conscious) Sedation combined with Local Anesthesia Indication(s): Analgesia and Anxiety Route: Intravenous (IV) IV Access: Secured Sedation: Meaningful verbal contact was maintained at all times during the procedure  Local Anesthetic: Lidocaine 1-2%   Position: Prone with head of the table raised to facilitate breathing.   Indications: 1. Cervical facet joint syndrome (C2,3,4,5)    Pain Score: Pre-procedure: 4 /10 Post-procedure: 0-No pain/10  Pre-op Assessment:  Tony Benson is a 59 y.o. (year old), male patient, seen today for interventional treatment. He  has a past surgical history that includes Lung surgery; Cholecystectomy; and Colonoscopy with propofol (N/A, 11/18/2018). Tony Benson has a current medication list which includes the following prescription(s): amlodipine, celecoxib, diazepam, gabapentin, glipizide, losartan, metformin, metoprolol tartrate, omega-3 fatty acids, vitamin d (ergocalciferol), and vitamin e, and the following Facility-Administered Medications: midazolam. His primarily concern today is the Neck Pain (right )  Initial Vital  Signs:  Pulse/HCG Rate: 85ECG Heart Rate: 79 Temp:   Resp: 16 BP: (!) 159/108(MD awatre of BP) SpO2: 99 %  BMI: Estimated body mass index is 30.13 kg/m as calculated from the following:   Height as of 03/10/19: 5\' 10"  (1.778 m).   Weight as of 03/10/19: 210 lb (95.3 kg).  Risk Assessment: Allergies: Reviewed. He is allergic to other and morphine and related.  Allergy Precautions: None required Coagulopathies: Reviewed. None identified.  Blood-thinner therapy: None at this time Active Infection(s): Reviewed. None identified. Tony Benson is afebrile  Site Confirmation: Tony Benson was asked to confirm the procedure and laterality before marking the site Procedure checklist: Completed Consent: Before the procedure and under the influence of no sedative(s), amnesic(s), or anxiolytics, the patient was informed of the treatment options, risks and possible complications. To fulfill our ethical and legal obligations, as recommended by the American Medical Association's Code of Ethics, I have informed the patient of my clinical impression; the nature and purpose of the treatment or procedure; the risks, benefits, and possible complications of the intervention; the alternatives, including doing nothing; the risk(s) and benefit(s) of the alternative treatment(s) or procedure(s); and the risk(s) and benefit(s) of doing nothing. The patient was provided information about the general risks and possible complications associated with the procedure. These may include, but are not limited to: failure to achieve desired goals, infection, bleeding, organ or nerve damage, allergic reactions, paralysis, and death. In addition, the patient was informed of those risks and complications associated to Spine-related procedures, such as failure to decrease pain; infection (i.e.: Meningitis, epidural or intraspinal abscess); bleeding (i.e.: epidural hematoma, subarachnoid hemorrhage, or any other type of intraspinal or  peri-dural bleeding); organ or nerve damage (i.e.: Any type of peripheral nerve, nerve root, or spinal cord injury) with subsequent  damage to sensory, motor, and/or autonomic systems, resulting in permanent pain, numbness, and/or weakness of one or several areas of the body; allergic reactions; (i.e.: anaphylactic reaction); and/or death. Furthermore, the patient was informed of those risks and complications associated with the medications. These include, but are not limited to: allergic reactions (i.e.: anaphylactic or anaphylactoid reaction(s)); adrenal axis suppression; blood sugar elevation that in diabetics may result in ketoacidosis or comma; water retention that in patients with history of congestive heart failure may result in shortness of breath, pulmonary edema, and decompensation with resultant heart failure; weight gain; swelling or edema; medication-induced neural toxicity; particulate matter embolism and blood vessel occlusion with resultant organ, and/or nervous system infarction; and/or aseptic necrosis of one or more joints. Finally, the patient was informed that Medicine is not an exact science; therefore, there is also the possibility of unforeseen or unpredictable risks and/or possible complications that may result in a catastrophic outcome. The patient indicated having understood very clearly. We have given the patient no guarantees and we have made no promises. Enough time was given to the patient to ask questions, all of which were answered to the patient's satisfaction. Tony Benson has indicated that he wanted to continue with the procedure. Attestation: I, the ordering provider, attest that I have discussed with the patient the benefits, risks, side-effects, alternatives, likelihood of achieving goals, and potential problems during recovery for the procedure that I have provided informed consent. Date  Time: 05/10/2019  9:27 AM  Pre-Procedure Preparation:  Monitoring: As per clinic  protocol. Respiration, ETCO2, SpO2, BP, heart rate and rhythm monitor placed and checked for adequate function Safety Precautions: Patient was assessed for positional comfort and pressure points before starting the procedure. Time-out: I initiated and conducted the "Time-out" before starting the procedure, as per protocol. The patient was asked to participate by confirming the accuracy of the "Time Out" information. Verification of the correct person, site, and procedure were performed and confirmed by me, the nursing staff, and the patient. "Time-out" conducted as per Joint Commission's Universal Protocol (UP.01.01.01). Time: 1005  Description of Procedure:          Laterality: Bilateral. The procedure was performed in identical fashion on both sides. Level: C3, C4, C5, Medial Branch Level(s). Area Prepped: Posterior Cervico-thoracic Region Prepping solution: ChloraPrep (2% chlorhexidine gluconate and 70% isopropyl alcohol) Safety Precautions: Aspiration looking for blood return was conducted prior to all injections. At no point did we inject any substances, as a needle was being advanced. Before injecting, the patient was told to immediately notify me if he was experiencing any new onset of "ringing in the ears, or metallic taste in the mouth". No attempts were made at seeking any paresthesias. Safe injection practices and needle disposal techniques used. Medications properly checked for expiration dates. SDV (single dose vial) medications used. After the completion of the procedure, all disposable equipment used was discarded in the proper designated medical waste containers. Local Anesthesia: Protocol guidelines were followed. The patient was positioned over the fluoroscopy table. The area was prepped in the usual manner. The time-out was completed. The target area was identified using fluoroscopy. A 12-in long, straight, sterile hemostat was used with fluoroscopic guidance to locate the targets for  each level blocked. Once located, the skin was marked with an approved surgical skin marker. Once all sites were marked, the skin (epidermis, dermis, and hypodermis), as well as deeper tissues (fat, connective tissue and muscle) were infiltrated with a small amount of a short-acting local anesthetic,  loaded on a 10cc syringe with a 25G, 1.5-in  Needle. An appropriate amount of time was allowed for local anesthetics to take effect before proceeding to the next step. Local Anesthetic: Lidocaine 2.0% The unused portion of the local anesthetic was discarded in the proper designated containers. Technical explanation of process:   C3 Medial Branch Nerve Block (MBB): The target area for the C3 dorsal medial articular branch is the lateral concave waist of the articular pillar of C3. Under fluoroscopic guidance, a Quincke needle was inserted until contact was made with os over the postero-lateral aspect of the articular pillar of C3 (target area). After negative aspiration for blood, 1 mL of the nerve block solution was injected without difficulty or complication. The needle was removed intact. C4 Medial Branch Nerve Block (MBB): The target area for the C4 dorsal medial articular branch is the lateral concave waist of the articular pillar of C4. Under fluoroscopic guidance, a Quincke needle was inserted until contact was made with os over the postero-lateral aspect of the articular pillar of C4 (target area). After negative aspiration for blood, 59mL of the nerve block solution was injected without difficulty or complication. The needle was removed intact. C5 Medial Branch Nerve Block (MBB): The target area for the C5 dorsal medial articular branch is the lateral concave waist of the articular pillar of C5. Under fluoroscopic guidance, a Quincke needle was inserted until contact was made with os over the postero-lateral aspect of the articular pillar of C5 (target area). After negative aspiration for blood, 29mL of the  nerve block solution was injected without difficulty or complication. The needle was removed intact.   Nerve block solution: 6 cc solution made of 4 cc of 0.2% ropivacaine, 2 cc of Decadron 10 mg/cc.  1 cc injected at each level above bilaterally.   Once the entire procedure was completed, the treated area was cleaned, making sure to leave some of the prepping solution back to take advantage of its long term bactericidal properties.  Vitals:   05/10/19 1020 05/10/19 1029 05/10/19 1039 05/10/19 1050  BP: (!) 166/105 (!) 169/92 (!) 176/95 (!) 174/93  Pulse: 76     Resp: 17 16 16 16   SpO2: 96% 97% 97% 97%    Start Time: 1005 hrs. End Time: 1020 hrs.  Imaging Guidance (Spinal):          Type of Imaging Technique: Fluoroscopy Guidance (Spinal) Indication(s): Assistance in needle guidance and placement for procedures requiring needle placement in or near specific anatomical locations not easily accessible without such assistance. Exposure Time: Please see nurses notes. Contrast: None used. Fluoroscopic Guidance: I was personally present during the use of fluoroscopy. "Tunnel Vision Technique" used to obtain the best possible view of the target area. Parallax error corrected before commencing the procedure. "Direction-depth-direction" technique used to introduce the needle under continuous pulsed fluoroscopy. Once target was reached, antero-posterior, oblique, and lateral fluoroscopic projection used confirm needle placement in all planes. Images permanently stored in EMR. Interpretation: No contrast injected. I personally interpreted the imaging intraoperatively. Adequate needle placement confirmed in multiple planes. Permanent images saved into the patient's record.  Antibiotic Prophylaxis:   Anti-infectives (From admission, onward)   None     Indication(s): None identified  Post-operative Assessment:  Post-procedure Vital Signs:  Pulse/HCG Rate: 7682 Temp:   Resp: 16 BP: (!)  174/93 SpO2: 97 %  EBL: None  Complications: No immediate post-treatment complications observed by team, or reported by patient.  Note: The patient tolerated the  entire procedure well. A repeat set of vitals were taken after the procedure and the patient was kept under observation following institutional policy, for this type of procedure. Post-procedural neurological assessment was performed, showing return to baseline, prior to discharge. The patient was provided with post-procedure discharge instructions, including a section on how to identify potential problems. Should any problems arise concerning this procedure, the patient was given instructions to immediately contact us, at any time, without hesitation. In any case, we plan to contact the patient by telephone for a follow-up status report regarding this interventional procedure.  Comments:  No additional relevant information. 5 out of 5 strength bilateral upper extremity: Shoulder abduction, elbow flexion, elbow extension, thumb extension.  Plan of Care    Imaging Orders     DG PAIN CLINIC C-ARM 1-60 MIN NO REPORT Procedure Orders    No procedure(s) ordered today    Medications ordered for procedure: Meds ordered this encounter  Medications  . lidocaine (XYLOCAINE) 2 % (with pres) injection 400 mg  . midazolam (VERSED) 5 MG/5ML injection 1-2 mg    Make sure Flumazenil is available in the pyxis when using this medication. If oversedation occurs, administer 0.2 mg IV over 15 sec. If after 45 sec no response, administer 0.2 mg again over 1 min; may repeat at 1 min intervals; not to exceed 4 doses (1 mg)  . fentaNYL (SUBLIMAZE) injection 25-50 mcg    Make sure Narcan is available in the pyxis when using this medication. In the event of respiratory depression (RR< 8/min): Titrate NARCAN (naloxone) in increments of 0.1 to 0.2 mg IV at 2-3 minute intervals, until desired degree of reversal.  . ropivacaine (PF) 2 mg/mL (0.2%) (NAROPIN)  injection 2 mL  . dexamethasone (DECADRON) injection 10 mg  . dexamethasone (DECADRON) injection 10 mg   Medications administered: We administered lidocaine, midazolam, fentaNYL, ropivacaine (PF) 2 mg/mL (0.2%), dexamethasone, and dexamethasone.  See the medical record for exact dosing, route, and time of administration.  Disposition: Discharge home  Discharge Date & Time: 05/10/2019; 1052 hrs.   Physician-requested Follow-up: Return in about 10 weeks (around 07/19/2019) for Post Procedure Evaluation, virtual.  Future Appointments  Date Time Provider [place]  07/20/2019  8:45 AM Gillis Santa, MD [facility] None   Primary Care Physician: Casilda Carls, MD Location: [facility] Note by: Gillis Santa, MD Date: 05/10/2019; Time: 12:35 PM  Disclaimer:  Medicine is not an exact science. The only guarantee in medicine is that nothing is guaranteed. It is important to note that the decision to proceed with this intervention was based on the information collected from the patient. The Data and conclusions were drawn from the patient's questionnaire, the interview, and the physical examination. Because the information was provided in large part by the patient, it cannot be guaranteed that it has not been purposely or unconsciously manipulated. Every effort has been made to obtain as much relevant data as possible for this evaluation. It is important to note that the conclusions that lead to this procedure are derived in large part from the available data. Always take into account that the treatment will also be dependent on availability of resources and existing treatment guidelines, considered by other Pain Management Practitioners as being common knowledge and practice, at the time of the intervention. For Medico-Legal purposes, it is also important to point out that variation in procedural techniques and pharmacological choices are the acceptable norm. The  indications, contraindications, technique, and results of the above  procedure should only be interpreted and judged by a Board-Certified Interventional Pain Specialist with extensive familiarity and expertise in the same exact procedure and technique.

## 2019-05-10 NOTE — Patient Instructions (Signed)

## 2019-05-10 NOTE — Progress Notes (Signed)
Safety precautions to be maintained throughout the outpatient stay will include: orient to surroundings, keep bed in low position, maintain call bell within reach at all times, provide assistance with transfer out of bed and ambulation.  

## 2019-05-11 ENCOUNTER — Telehealth: Payer: Self-pay | Admitting: *Deleted

## 2019-05-11 NOTE — Telephone Encounter (Signed)
Spoke with patient's wife, no problems post procedure. 

## 2019-05-12 ENCOUNTER — Telehealth: Payer: Self-pay | Admitting: Student in an Organized Health Care Education/Training Program

## 2019-05-12 NOTE — Telephone Encounter (Signed)
Message left on voicemail informing patient that work note will be mailed.

## 2019-05-12 NOTE — Telephone Encounter (Signed)
Needs Work Note giving him off for 1-25 and 1-26 Can mail to him or email  [email or web address]

## 2019-07-19 ENCOUNTER — Telehealth: Payer: Self-pay

## 2019-07-19 NOTE — Telephone Encounter (Signed)
Attempted to call patient. No answer. Left message on AM to call us  so we can review PP /follow up on 05/10/19 Cervical facet C345.

## 2019-07-20 ENCOUNTER — Ambulatory Visit: Payer: BC Managed Care – PPO | Admitting: Student in an Organized Health Care Education/Training Program

## 2019-07-21 ENCOUNTER — Telehealth: Payer: Self-pay | Admitting: *Deleted

## 2019-07-21 NOTE — Telephone Encounter (Signed)
0915 Attempted to call patient for pre visit info. No answer. LVM. No answer on mobile number as well.

## 2019-07-22 ENCOUNTER — Telehealth: Payer: Self-pay | Admitting: *Deleted

## 2019-07-22 ENCOUNTER — Encounter: Payer: Self-pay | Admitting: Student in an Organized Health Care Education/Training Program

## 2019-07-22 ENCOUNTER — Other Ambulatory Visit: Payer: Self-pay

## 2019-07-22 ENCOUNTER — Ambulatory Visit
Payer: BC Managed Care – PPO | Attending: Student in an Organized Health Care Education/Training Program | Admitting: Student in an Organized Health Care Education/Training Program

## 2019-07-22 DIAGNOSIS — M7918 Myalgia, other site: Secondary | ICD-10-CM | POA: Diagnosis not present

## 2019-07-22 DIAGNOSIS — M47812 Spondylosis without myelopathy or radiculopathy, cervical region: Secondary | ICD-10-CM

## 2019-07-22 DIAGNOSIS — G8929 Other chronic pain: Secondary | ICD-10-CM

## 2019-07-22 DIAGNOSIS — G894 Chronic pain syndrome: Secondary | ICD-10-CM | POA: Diagnosis not present

## 2019-07-22 DIAGNOSIS — M542 Cervicalgia: Secondary | ICD-10-CM

## 2019-07-22 DIAGNOSIS — M25511 Pain in right shoulder: Secondary | ICD-10-CM

## 2019-07-22 NOTE — Telephone Encounter (Signed)
Attempted to call for pre appointment review of allergies/meds. Messages left at both numbers. 

## 2019-07-22 NOTE — Progress Notes (Signed)
Pain relief after procedure (treated area only): (Questions asked to patient) 1. Starting about 15 minutes after the procedure, and "while the area was still numb" (from the local anesthetics), were you having any of your usual pain "in that area" (the treated area)?  (NOTE: NOT including the discomfort from the needle sticks.) First 1 hour: 100 % better. First 4-6 hours: 100 % better. 2. How long did the numbness from the local anesthetics last? (More than 6 hours?) Duration:8hours.  3. How much better is your pain now, when compared to before the procedure? Current benefit: 40 % better. 4. Can you move better now? Improvement in ROM (Range of Motion): Yes. 5. Can you do more now? Improvement in function: Yes. 4. Did you have any problems with the procedure? Side-effects/Complications: No.

## 2019-07-22 NOTE — Progress Notes (Signed)
Patient: Tony Benson  Service Category: E/M  Provider: Gillis Santa, MD  DOB: [birth year]  DOS: 07/22/2019  Location: Office  MRN: [id number]  Setting: Ambulatory outpatient  Referring Provider: Casilda Carls, MD  Type: Established Patient  Specialty: Interventional Pain Management  PCP: Casilda Carls, MD  Location: Home  Delivery: TeleHealth     Virtual Encounter - Pain Management PROVIDER NOTE: Information contained herein reflects review and annotations entered in association with encounter. Interpretation of such information and data should be left to medically-trained personnel. Information provided to patient can be located elsewhere in the medical record under "Patient Instructions". Document created using STT-dictation technology, any transcriptional errors that may result from process are unintentional.    Contact & Pharmacy Preferred: [place]: [phone] (home) Mobile: [phone] (mobile) E-mail: [email or web address]  [id number] [place]: [phone]: [phone]  Pre-screening  Mr. MSciandraoffered "in-person" vs "virtual" encounter. He indicated preferring virtual for this encounter.   Reason COVID-19*  Social distancing based on CDC and AMA recommendations.   I contacted JPilar Jarvison 07/22/2019 via telephone.      I clearly identified myself as BGillis Santa MD. I verified that I was speaking with the correct person using two identifiers (Name: JTAYVEON LOMBARDO and date of birth: 112/11/1960.  This visit was completed via telephone due to the restrictions of the COVID-19 pandemic. All issues as above were discussed and addressed but no physical exam was performed. If it was felt that the patient should be evaluated in the office, they were directed there. The patient verbally consented to this visit. Patient was unable to complete an audio/visual visit due to Technical difficulties  and/or Lack of internet. Due to the catastrophic nature of the COVID-19 pandemic, this visit was done through audio contact only.  Location of the patient: home address (see Epic for details)  Location of the provider: office Consent I sought verbal advanced consent from JPilar Jarvisfor virtual visit interactions. I informed Mr. MKilleof possible security and privacy concerns, risks, and limitations associated with providing "not-in-person" medical evaluation and management services. I also informed Mr. MSiedschlagof the availability of "in-person" appointments. Finally, I informed him that there would be a charge for the virtual visit and that he could be  personally, fully or partially, financially responsible for it. Mr. MAroraexpressed understanding and agreed to proceed.   Historic Elements   Mr. JADYEN BIFULCOis a 59y.o. year old, male patient evaluated today after his last contact with our practice on 07/22/2019. Mr. MCedar has a past medical history of Asthma, Diabetes mellitus without complication (HBerkey, and Hypertension. He also  has a past surgical history that includes Lung surgery; Cholecystectomy; and Colonoscopy with propofol (N/A, 11/18/2018). Mr. MResorhas a current medication list which includes the following prescription(s): amlodipine, celecoxib, diazepam, gabapentin, glipizide, losartan, metformin, metoprolol tartrate, omega-3 fatty acids, vitamin d (ergocalciferol), and vitamin e. He  reports that he has never smoked. He has never used smokeless tobacco. He reports that he does not drink alcohol or use drugs. Mr. MMosleyis allergic to other and morphine and related.   HPI  Today, he is being contacted for a post-procedure assessment.   Bilateral C3, C4, C5 facet medial branch nerve block, 05/10/2019.  (#1 in 2021)  Pain relief after procedure (treated area only): (Questions asked to patient) 1. Starting about 15 minutes after the  procedure, and "while the area was still numb"  (from the local anesthetics), were you having any of your usual pain "in that area" (the treated area)?  (NOTE: NOT including the discomfort from the needle sticks.) First 1 hour: 100 % better. First 4-6 hours: 100 % better. 2. How long did the numbness from the local anesthetics last? (More than 6 hours?) Duration:8hours.  3. How much better is your pain now, when compared to before the procedure? Current benefit: 40 % better. 4. Can you move better now? Improvement in ROM (Range of Motion): Yes. 5. Can you do more now? Improvement in function: Yes. 4. Did you have any problems with the procedure? Side-effects/Complications: No.  Laboratory Chemistry Profile   Renal Lab Results  Component Value Date   BUN 13 09/24/2017   CREATININE 1.11 09/24/2017   GFRAA >60 09/24/2017   GFRNONAA >60 09/24/2017     Hepatic Lab Results  Component Value Date   AST 18 09/24/2017   ALT 24 09/24/2017   ALBUMIN 3.8 09/24/2017   ALKPHOS 62 09/24/2017     Electrolytes Lab Results  Component Value Date   NA 141 09/24/2017   K 4.1 09/24/2017   CL 106 09/24/2017   CALCIUM 9.7 09/24/2017     Bone No results found for: VD25OH, VD125OH2TOT, SF6812XN1, ZG0174BS4, 25OHVITD1, 25OHVITD2, 25OHVITD3, TESTOFREE, TESTOSTERONE   Inflammation (CRP: Acute Phase) (ESR: Chronic Phase) No results found for: CRP, ESRSEDRATE, LATICACIDVEN     Note: Above Lab results reviewed.  Assessment  The primary encounter diagnosis was Cervical facet joint syndrome (C2,3,4,5). Diagnoses of Arthropathy of cervical facet joint, Chronic myofascial pain, Chronic pain syndrome, Cervicalgia, and Chronic right shoulder pain were also pertinent to this visit.  Plan of Care  Mr. SIRRON FRANCESCONI has a current medication list which includes the following long-term medication(s): amlodipine, gabapentin, glipizide, losartan, metformin, and metoprolol tartrate.  Orders:  Orders Placed This Encounter  Procedures  . CERVICAL FACET  (MEDIAL BRANCH NERVE BLOCK)     CLINICAL INDICATIONS: Neck pain. Indications: Cervical Facet Syndrome.    Standing Status:   Standing    Number of Occurrences:   2    Standing Expiration Date:   01/20/2021    Scheduling Instructions:     LATERALITY: TBD     Level: C3-4, C4-5, Facet joints (C3, C4, C5Medial Branch Nerves)     SEDATION: Patient's choice.     TIMEFRAME: PRN procedure. (Mr. Ricke will call when needed.)    Order Specific Question:   Where will this procedure be performed?    Answer:   [facility]   Follow-up plan:   Return if symptoms worsen or fail to improve.     Status post right C3, C4, C5 RFA on 12/16/2018, left 01/13/2019.  Better benefit with fct blocks vs RFA, repeat c-fct blocks q3-4 months prn     Recent Visits Date Type Provider Dept  05/10/19 Procedure visit Gillis Santa, MD [facility]  04/28/19 Office Visit Gillis Santa, MD [facility]  Showing recent visits within past 90 days and meeting all other requirements   Today's Visits Date Type Provider Dept  07/22/19 Office Visit Gillis Santa, MD [facility]  Showing today's visits and meeting all other requirements   Future Appointments No visits were found meeting these conditions.  Showing future appointments within next 90 days and meeting all other requirements   I discussed the assessment and treatment plan with the patient. The patient was provided  an opportunity to ask questions and all were answered. The patient agreed with the plan and demonstrated an understanding of the instructions.  Patient advised to call back or seek an in-person evaluation if the symptoms or condition worsens.  Duration of encounter: 15 minutes.  Note by: Gillis Santa, MD Date: 07/22/2019; Time: 9:43 AM

## 2020-02-23 ENCOUNTER — Other Ambulatory Visit: Payer: Self-pay

## 2020-02-23 ENCOUNTER — Ambulatory Visit
Admission: RE | Admit: 2020-02-23 | Discharge: 2020-02-23 | Disposition: A | Discharge status: Home / Self Care | Payer: BC Managed Care – PPO | Source: Ambulatory Visit | Attending: Student in an Organized Health Care Education/Training Program | Admitting: Student in an Organized Health Care Education/Training Program

## 2020-02-23 ENCOUNTER — Encounter: Payer: Self-pay | Admitting: Student in an Organized Health Care Education/Training Program

## 2020-02-23 ENCOUNTER — Ambulatory Visit (HOSPITAL_BASED_OUTPATIENT_CLINIC_OR_DEPARTMENT_OTHER): Payer: BC Managed Care – PPO | Admitting: Student in an Organized Health Care Education/Training Program

## 2020-02-23 DIAGNOSIS — G894 Chronic pain syndrome: Secondary | ICD-10-CM | POA: Insufficient documentation

## 2020-02-23 DIAGNOSIS — M47812 Spondylosis without myelopathy or radiculopathy, cervical region: Secondary | ICD-10-CM | POA: Insufficient documentation

## 2020-02-23 MED ORDER — DEXAMETHASONE SODIUM PHOSPHATE 10 MG/ML IJ SOLN
INTRAMUSCULAR | Status: AC
  Filled 2020-02-23: qty 1

## 2020-02-23 MED ORDER — DEXAMETHASONE SODIUM PHOSPHATE 10 MG/ML IJ SOLN
10.0000 mg | Freq: Once | INTRAMUSCULAR | Status: AC
  Administered 2020-02-23: 10 mg

## 2020-02-23 MED ORDER — FENTANYL CITRATE (PF) 100 MCG/2ML IJ SOLN
25.0000 ug | INTRAMUSCULAR | Status: DC | PRN
  Administered 2020-02-23: 50 ug via INTRAVENOUS

## 2020-02-23 MED ORDER — ROPIVACAINE HCL 2 MG/ML IJ SOLN
9.0000 mL | Freq: Once | INTRAMUSCULAR | Status: AC
  Administered 2020-02-23: 9 mL via PERINEURAL

## 2020-02-23 MED ORDER — FENTANYL CITRATE (PF) 100 MCG/2ML IJ SOLN
INTRAMUSCULAR | Status: AC
  Filled 2020-02-23: qty 2

## 2020-02-23 MED ORDER — LIDOCAINE HCL 2 % IJ SOLN
INTRAMUSCULAR | Status: AC
  Filled 2020-02-23: qty 20

## 2020-02-23 MED ORDER — LIDOCAINE HCL 2 % IJ SOLN
20.0000 mL | Freq: Once | INTRAMUSCULAR | Status: AC
  Administered 2020-02-23: 400 mg

## 2020-02-23 MED ORDER — ROPIVACAINE HCL 2 MG/ML IJ SOLN
INTRAMUSCULAR | Status: AC
  Filled 2020-02-23: qty 10

## 2020-02-23 NOTE — Patient Instructions (Signed)
____________________________________________________________________________________________  Post-Procedure Discharge Instructions  Instructions:  Apply ice:   Purpose: This will minimize any swelling and discomfort after procedure.   When: Day of procedure, as soon as you get home.  How: Fill a plastic sandwich bag with crushed ice. Cover it with a small towel and apply to injection site.  How long: (15 min on, 15 min off) Apply for 15 minutes then remove x 15 minutes.  Repeat sequence on day of procedure, until you go to bed.  Apply heat:   Purpose: To treat any soreness and discomfort from the procedure.  When: Starting the next day after the procedure.  How: Apply heat to procedure site starting the day following the procedure.  How long: May continue to repeat daily, until discomfort goes away.  Food intake: Start with clear liquids (like water) and advance to regular food, as tolerated.   Physical activities: Keep activities to a minimum for the first 8 hours after the procedure. After that, then as tolerated.  Driving: If you have received any sedation, be responsible and do not drive. You are not allowed to drive for 24 hours after having sedation.  Blood thinner: (Applies only to those taking blood thinners) You may restart your blood thinner 6 hours after your procedure.  Insulin: (Applies only to Diabetic patients taking insulin) As soon as you can eat, you may resume your normal dosing schedule.  Infection prevention: Keep procedure site clean and dry. Shower daily and clean area with soap and water.  Post-procedure Pain Diary: Extremely important that this be done correctly and accurately. Recorded information will be used to determine the next step in treatment. For the purpose of accuracy, follow these rules:  Evaluate only the area treated. Do not report or include pain from an untreated area. For the purpose of this evaluation, ignore all other areas of pain,  except for the treated area.  After your procedure, avoid taking a long nap and attempting to complete the pain diary after you wake up. Instead, set your alarm clock to go off every hour, on the hour, for the initial 8 hours after the procedure. Document the duration of the numbing medicine, and the relief you are getting from it.  Do not go to sleep and attempt to complete it later. It will not be accurate. If you received sedation, it is likely that you were given a medication that may cause amnesia. Because of this, completing the diary at a later time may cause the information to be inaccurate. This information is needed to plan your care.  Follow-up appointment: Keep your post-procedure follow-up evaluation appointment after the procedure (usually 2 weeks for most procedures, 6 weeks for radiofrequencies). DO NOT FORGET to bring you pain diary with you.   Expect: (What should I expect to see with my procedure?)  From numbing medicine (AKA: Local Anesthetics): Numbness or decrease in pain. You may also experience some weakness, which if present, could last for the duration of the local anesthetic.  Onset: Full effect within 15 minutes of injected.  Duration: It will depend on the type of local anesthetic used. On the average, 1 to 8 hours.   From steroids (Applies only if steroids were used): Decrease in swelling or inflammation. Once inflammation is improved, relief of the pain will follow.  Onset of benefits: Depends on the amount of swelling present. The more swelling, the longer it will take for the benefits to be seen. In some cases, up to 10 days.    Duration: Steroids will stay in the system x 2 weeks. Duration of benefits will depend on multiple posibilities including persistent irritating factors.  Side-effects: If present, they may typically last 2 weeks (the duration of the steroids).  Frequent: Cramps (if they occur, drink Gatorade and take over-the-counter Magnesium 450-500 mg  once to twice a day); water retention with temporary weight gain; increases in blood sugar; decreased immune system response; increased appetite.  Occasional: Facial flushing (red, warm cheeks); mood swings; menstrual changes.  Uncommon: Long-term decrease or suppression of natural hormones; bone thinning. (These are more common with higher doses or more frequent use. This is why we prefer that our patients avoid having any injection therapies in other practices.)   Very Rare: Severe mood changes; psychosis; aseptic necrosis.  From procedure: Some discomfort is to be expected once the numbing medicine wears off. This should be minimal if ice and heat are applied as instructed.  Call if: (When should I call?)  You experience numbness and weakness that gets worse with time, as opposed to wearing off.  New onset bowel or bladder incontinence. (Applies only to procedures done in the spine)  Emergency Numbers:  Durning business hours (Monday - Thursday, 8:00 AM - 4:00 PM) (Friday, 9:00 AM - 12:00 Noon): [phone]  After hours: [phone]  NOTE: If you are having a problem and are unable connect with, or to talk to a provider, then go to your nearest urgent care or emergency department. If the problem is serious and urgent, please call 911. ____________________________________________________________________________________________  Sacroiliac (SI) Joint Injection Patient Information  Description: The sacroiliac joint connects the scrum (very low back and tailbone) to the ilium (a pelvic bone which also forms half of the hip joint).  Normally this joint experiences very little motion.  When this joint becomes inflamed or unstable low back and or hip and pelvis pain may result.  Injection of this joint with local anesthetics (numbing medicines) and steroids can provide diagnostic information and reduce pain.  This injection is performed with the aid of x-ray guidance into the tailbone  area while you are lying on your stomach.   You may experience an electrical sensation down the leg while this is being done.  You may also experience numbness.  We also may ask if we are reproducing your normal pain during the injection.  Conditions which may be treated SI injection:   Low back, buttock, hip or leg pain  Preparation for the Injection:  1. Do not eat any solid food or dairy products within 8 hours of your appointment.  2. You may drink clear liquids up to 3 hours before appointment.  Clear liquids include water, black coffee, juice or soda.  No milk or cream please. 3. You may take your regular medications, including pain medications with a sip of water before your appointment.  Diabetics should hold regular insulin (if take separately) and take 1/2 normal NPH dose the morning of the procedure.  Carry some sugar containing items with you to your appointment. 4. A driver must accompany you and be prepared to drive you home after your procedure. 5. Bring all of your current medications with you. 6. An IV may be inserted and sedation may be given at the discretion of the physician. 7. A blood pressure cuff, EKG and other monitors will often be applied during the procedure.  Some patients may need to have extra oxygen administered for a short period.  8. You will be asked to  provide medical information, including your allergies, prior to the procedure.  We must know immediately if you are taking blood thinners (like Coumadin/Warfarin) or if you are allergic to IV iodine contrast (dye).  We must know if you could possible be pregnant.  Possible side effects:   Bleeding from needle site  Infection (rare, may require surgery)  Nerve injury (rare)  Numbness & tingling (temporary)  A brief convulsion or seizure  Light-headedness (temporary)  Pain at injection site (several days)  Decreased blood pressure (temporary)  Weakness in the leg (temporary)   Call if you  experience:   New onset weakness or numbness of an extremity below the injection site that last more than 8 hours.  Hives or difficulty breathing ( go to the emergency room)  Inflammation or drainage at the injection site  Any new symptoms which are concerning to you  Please note:  Although the local anesthetic injected can often make your back/ hip/ buttock/ leg feel good for several hours after the injections, the pain will likely return.  It takes 3-7 days for steroids to work in the sacroiliac area.  You may not notice any pain relief for at least that one week.  If effective, we will often do a series of three injections spaced 3-6 weeks apart to maximally decrease your pain.  After the initial series, we generally will wait some months before a repeat injection of the same type.  If you have any questions, please call [phone] [facility]

## 2020-02-23 NOTE — Progress Notes (Signed)
Safety precautions to be maintained throughout the outpatient stay will include: orient to surroundings, keep bed in low position, maintain call bell within reach at all times, provide assistance with transfer out of bed and ambulation.  

## 2020-02-23 NOTE — Progress Notes (Signed)
Patient's Name: Tony Benson  MRN: [id number]  Referring Provider: Gillis Santa, MD  DOB: Mar 17, 1961  PCP: Casilda Carls, MD  DOS: 02/23/2020  Note by: Gillis Santa, MD  Service setting: Ambulatory outpatient  Specialty: Interventional Pain Management  Patient type: Established  Location: [facility] (AMB) Pain Management Facility  Visit type: Interventional Procedure   Primary Reason for Visit: Interventional Pain Management Treatment. CC: Neck Pain (right )  Procedure:          Anesthesia, Analgesia, Anxiolysis:  Type: Cervical Facet Medial Branch Block(s)  #2 (previously done Jan 2021) Primary Purpose: Therapeutic Region: Posterolateral cervical spine Level: C3, C4, C5,Medial Branch Level(s). Injecting these levels blocks the C3-4, C4-5, cervical facet joints. Laterality: Right  Type: Moderate (Conscious) Sedation combined with Local Anesthesia Indication(s): Analgesia and Anxiety Route: Intravenous (IV) IV Access: Secured Sedation: Meaningful verbal contact was maintained at all times during the procedure  Local Anesthetic: Lidocaine 1-2%   Position: Prone with head of the table raised to facilitate breathing.   Indications: 1. Cervical facet joint syndrome (C2,3,4,5)   2. Chronic pain syndrome   3. Arthropathy of cervical facet joint    Pain Score: Pre-procedure: 8 /10 Post-procedure: 0-No pain/10  Pre-op Assessment:  Tony Benson is a 59 y.o. (year old), male patient, seen today for interventional treatment. He  has a past surgical history that includes Lung surgery; Cholecystectomy; and Colonoscopy with propofol (N/A, 11/18/2018). Tony Benson has a current medication list which includes the following prescription(s): amlodipine, celecoxib, diazepam, gabapentin, glipizide, losartan, metformin, metoprolol tartrate, omega-3 fatty acids, vitamin d (ergocalciferol), and vitamin e, and the following Facility-Administered Medications: fentanyl. His primarily concern today is the Neck Pain  (right )  Initial Vital Signs:  Pulse/HCG Rate: 93ECG Heart Rate: 84 Temp: 97.9 F (36.6 C) Resp: 16 BP: (!) 157/98 SpO2: 99 %  BMI: Estimated body mass index is 30.13 kg/m as calculated from the following:   Height as of this encounter: 5\' 10"  (1.778 m).   Weight as of this encounter: 210 lb (95.3 kg).  Risk Assessment: Allergies: Reviewed. He is allergic to other and morphine and related.  Allergy Precautions: None required Coagulopathies: Reviewed. None identified.  Blood-thinner therapy: None at this time Active Infection(s): Reviewed. None identified. Tony Benson is afebrile  Site Confirmation: Tony Benson was asked to confirm the procedure and laterality before marking the site Procedure checklist: Completed Consent: Before the procedure and under the influence of no sedative(s), amnesic(s), or anxiolytics, the patient was informed of the treatment options, risks and possible complications. To fulfill our ethical and legal obligations, as recommended by the American Medical Association's Code of Ethics, I have informed the patient of my clinical impression; the nature and purpose of the treatment or procedure; the risks, benefits, and possible complications of the intervention; the alternatives, including doing nothing; the risk(s) and benefit(s) of the alternative treatment(s) or procedure(s); and the risk(s) and benefit(s) of doing nothing. The patient was provided information about the general risks and possible complications associated with the procedure. These may include, but are not limited to: failure to achieve desired goals, infection, bleeding, organ or nerve damage, allergic reactions, paralysis, and death. In addition, the patient was informed of those risks and complications associated to Spine-related procedures, such as failure to decrease pain; infection (i.e.: Meningitis, epidural or intraspinal abscess); bleeding (i.e.: epidural hematoma, subarachnoid hemorrhage, or  any other type of intraspinal or peri-dural bleeding); organ or nerve damage (i.e.: Any type of peripheral nerve, nerve root, or  spinal cord injury) with subsequent damage to sensory, motor, and/or autonomic systems, resulting in permanent pain, numbness, and/or weakness of one or several areas of the body; allergic reactions; (i.e.: anaphylactic reaction); and/or death. Furthermore, the patient was informed of those risks and complications associated with the medications. These include, but are not limited to: allergic reactions (i.e.: anaphylactic or anaphylactoid reaction(s)); adrenal axis suppression; blood sugar elevation that in diabetics may result in ketoacidosis or comma; water retention that in patients with history of congestive heart failure may result in shortness of breath, pulmonary edema, and decompensation with resultant heart failure; weight gain; swelling or edema; medication-induced neural toxicity; particulate matter embolism and blood vessel occlusion with resultant organ, and/or nervous system infarction; and/or aseptic necrosis of one or more joints. Finally, the patient was informed that Medicine is not an exact science; therefore, there is also the possibility of unforeseen or unpredictable risks and/or possible complications that may result in a catastrophic outcome. The patient indicated having understood very clearly. We have given the patient no guarantees and we have made no promises. Enough time was given to the patient to ask questions, all of which were answered to the patient's satisfaction. Tony Benson has indicated that he wanted to continue with the procedure. Attestation: I, the ordering provider, attest that I have discussed with the patient the benefits, risks, side-effects, alternatives, likelihood of achieving goals, and potential problems during recovery for the procedure that I have provided informed consent. Date  Time: 02/23/2020 10:40 AM  Pre-Procedure  Preparation:  Monitoring: As per clinic protocol. Respiration, ETCO2, SpO2, BP, heart rate and rhythm monitor placed and checked for adequate function Safety Precautions: Patient was assessed for positional comfort and pressure points before starting the procedure. Time-out: I initiated and conducted the "Time-out" before starting the procedure, as per protocol. The patient was asked to participate by confirming the accuracy of the "Time Out" information. Verification of the correct person, site, and procedure were performed and confirmed by me, the nursing staff, and the patient. "Time-out" conducted as per Joint Commission's Universal Protocol (UP.01.01.01). Time: 1117  Description of Procedure:          Laterality: Right Level: C3, C4, C5, Medial Branch Level(s). Area Prepped: Posterior Cervico-thoracic Region Prepping solution: ChloraPrep (2% chlorhexidine gluconate and 70% isopropyl alcohol) Safety Precautions: Aspiration looking for blood return was conducted prior to all injections. At no point did we inject any substances, as a needle was being advanced. Before injecting, the patient was told to immediately notify me if he was experiencing any new onset of "ringing in the ears, or metallic taste in the mouth". No attempts were made at seeking any paresthesias. Safe injection practices and needle disposal techniques used. Medications properly checked for expiration dates. SDV (single dose vial) medications used. After the completion of the procedure, all disposable equipment used was discarded in the proper designated medical waste containers. Local Anesthesia: Protocol guidelines were followed. The patient was positioned over the fluoroscopy table. The area was prepped in the usual manner. The time-out was completed. The target area was identified using fluoroscopy. A 12-in long, straight, sterile hemostat was used with fluoroscopic guidance to locate the targets for each level blocked. Once  located, the skin was marked with an approved surgical skin marker. Once all sites were marked, the skin (epidermis, dermis, and hypodermis), as well as deeper tissues (fat, connective tissue and muscle) were infiltrated with a small amount of a short-acting local anesthetic, loaded on a 10cc syringe with  a 25G, 1.5-in  Needle. An appropriate amount of time was allowed for local anesthetics to take effect before proceeding to the next step. Local Anesthetic: Lidocaine 2.0% The unused portion of the local anesthetic was discarded in the proper designated containers. Technical explanation of process:   C3 Medial Branch Nerve Block (MBB): The target area for the C3 dorsal medial articular branch is the lateral concave waist of the articular pillar of C3. Under fluoroscopic guidance, a Quincke needle was inserted until contact was made with os over the postero-lateral aspect of the articular pillar of C3 (target area). After negative aspiration for blood, 1 mL of the nerve block solution was injected without difficulty or complication. The needle was removed intact. C4 Medial Branch Nerve Block (MBB): The target area for the C4 dorsal medial articular branch is the lateral concave waist of the articular pillar of C4. Under fluoroscopic guidance, a Quincke needle was inserted until contact was made with os over the postero-lateral aspect of the articular pillar of C4 (target area). After negative aspiration for blood, 31mL of the nerve block solution was injected without difficulty or complication. The needle was removed intact. C5 Medial Branch Nerve Block (MBB): The target area for the C5 dorsal medial articular branch is the lateral concave waist of the articular pillar of C5. Under fluoroscopic guidance, a Quincke needle was inserted until contact was made with os over the postero-lateral aspect of the articular pillar of C5 (target area). After negative aspiration for blood, 62mL of the nerve block solution was  injected without difficulty or complication. The needle was removed intact.   Nerve block solution: 6 cc solution made of 4 cc of 0.2% ropivacaine, 2 cc of Decadron 10 mg/cc.  2 cc injected at each level above bilaterally.   Once the entire procedure was completed, the treated area was cleaned, making sure to leave some of the prepping solution back to take advantage of its long term bactericidal properties.  Vitals:   02/23/20 1129 02/23/20 1139 02/23/20 1149 02/23/20 1158  BP: 131/77 134/78 136/86 (!) 142/84  Pulse:      Resp: (!) 21 17 16 18   Temp:      TempSrc:      SpO2: 99% 100% 100% 100%  Weight:      Height:        Start Time: 1117 hrs. End Time: 1128 hrs.  Imaging Guidance (Spinal):          Type of Imaging Technique: Fluoroscopy Guidance (Spinal) Indication(s): Assistance in needle guidance and placement for procedures requiring needle placement in or near specific anatomical locations not easily accessible without such assistance. Exposure Time: Please see nurses notes. Contrast: None used. Fluoroscopic Guidance: I was personally present during the use of fluoroscopy. "Tunnel Vision Technique" used to obtain the best possible view of the target area. Parallax error corrected before commencing the procedure. "Direction-depth-direction" technique used to introduce the needle under continuous pulsed fluoroscopy. Once target was reached, antero-posterior, oblique, and lateral fluoroscopic projection used confirm needle placement in all planes. Images permanently stored in EMR. Interpretation: No contrast injected. I personally interpreted the imaging intraoperatively. Adequate needle placement confirmed in multiple planes. Permanent images saved into the patient's record.  Antibiotic Prophylaxis:   Anti-infectives (From admission, onward)   None     Indication(s): None identified  Post-operative Assessment:  Post-procedure Vital Signs:  Pulse/HCG Rate: 9380 Temp: 97.9  F (36.6 C) Resp: 18 BP: (!) 142/84 SpO2: 100 %  EBL: None  Complications: No immediate post-treatment complications observed by team, or reported by patient.  Note: The patient tolerated the entire procedure well. A repeat set of vitals were taken after the procedure and the patient was kept under observation following institutional policy, for this type of procedure. Post-procedural neurological assessment was performed, showing return to baseline, prior to discharge. The patient was provided with post-procedure discharge instructions, including a section on how to identify potential problems. Should any problems arise concerning this procedure, the patient was given instructions to immediately contact us, at any time, without hesitation. In any case, we plan to contact the patient by telephone for a follow-up status report regarding this interventional procedure.  Comments:  No additional relevant information. 5 out of 5 strength bilateral upper extremity: Shoulder abduction, elbow flexion, elbow extension, thumb extension.  Plan of Care    Imaging Orders     DG PAIN CLINIC C-ARM 1-60 MIN NO REPORT Procedure Orders    No procedure(s) ordered today    Medications ordered for procedure: Meds ordered this encounter  Medications  . lidocaine (XYLOCAINE) 2 % (with pres) injection 400 mg  . fentaNYL (SUBLIMAZE) injection 25-50 mcg    Make sure Narcan is available in the pyxis when using this medication. In the event of respiratory depression (RR< 8/min): Titrate NARCAN (naloxone) in increments of 0.1 to 0.2 mg IV at 2-3 minute intervals, until desired degree of reversal.  . ropivacaine (PF) 2 mg/mL (0.2%) (NAROPIN) injection 9 mL  . dexamethasone (DECADRON) injection 10 mg  . dexamethasone (DECADRON) injection 10 mg   Medications administered: We administered lidocaine, fentaNYL, ropivacaine (PF) 2 mg/mL (0.2%), dexamethasone, and dexamethasone.  See the medical record for exact  dosing, route, and time of administration.  Disposition: Discharge home  Discharge Date & Time: 02/23/2020; 1159 hrs.   Physician-requested Follow-up: Return in about 6 weeks (around 04/05/2020) for Post Procedure Evaluation, virtual.  Future Appointments  Date Time Provider [place]  04/05/2020  3:00 PM Gillis Santa, MD [facility] None   Primary Care Physician: Casilda Carls, MD Location: [facility] Note by: Gillis Santa, MD Date: 02/23/2020; Time: 1:37 PM  Disclaimer:  Medicine is not an exact science. The only guarantee in medicine is that nothing is guaranteed. It is important to note that the decision to proceed with this intervention was based on the information collected from the patient. The Data and conclusions were drawn from the patient's questionnaire, the interview, and the physical examination. Because the information was provided in large part by the patient, it cannot be guaranteed that it has not been purposely or unconsciously manipulated. Every effort has been made to obtain as much relevant data as possible for this evaluation. It is important to note that the conclusions that lead to this procedure are derived in large part from the available data. Always take into account that the treatment will also be dependent on availability of resources and existing treatment guidelines, considered by other Pain Management Practitioners as being common knowledge and practice, at the time of the intervention. For Medico-Legal purposes, it is also important to point out that variation in procedural techniques and pharmacological choices are the acceptable norm. The indications, contraindications, technique, and results of the above procedure should only be interpreted and judged by a Board-Certified Interventional Pain Specialist with extensive familiarity and expertise in the same exact procedure and technique.

## 2020-02-24 ENCOUNTER — Telehealth: Payer: Self-pay

## 2020-02-24 NOTE — Telephone Encounter (Signed)
Post procedure follow up.  Patient states he is doing well 

## 2020-04-05 ENCOUNTER — Ambulatory Visit
Payer: BC Managed Care – PPO | Attending: Student in an Organized Health Care Education/Training Program | Admitting: Student in an Organized Health Care Education/Training Program

## 2020-04-05 ENCOUNTER — Encounter: Payer: Self-pay | Admitting: Student in an Organized Health Care Education/Training Program

## 2020-04-05 ENCOUNTER — Other Ambulatory Visit: Payer: Self-pay

## 2020-04-05 DIAGNOSIS — G894 Chronic pain syndrome: Secondary | ICD-10-CM

## 2020-04-05 DIAGNOSIS — M47812 Spondylosis without myelopathy or radiculopathy, cervical region: Secondary | ICD-10-CM

## 2020-04-05 NOTE — Progress Notes (Signed)
Patient: Tony Benson  Service Category: E/M  Provider: Gillis Santa, MD  DOB: Dec 31, 1960  DOS: 04/05/2020  Location: Office  MRN: [id number]  Setting: Ambulatory outpatient  Referring Provider: Casilda Carls, MD  Type: Established Patient  Specialty: Interventional Pain Management  PCP: Casilda Carls, MD  Location: Home  Delivery: TeleHealth     Virtual Encounter - Pain Management PROVIDER NOTE: Information contained herein reflects review and annotations entered in association with encounter. Interpretation of such information and data should be left to medically-trained personnel. Information provided to patient can be located elsewhere in the medical record under "Patient Instructions". Document created using STT-dictation technology, any transcriptional errors that may result from process are unintentional.    Contact & Pharmacy Preferred: [place]: [phone] (home) Mobile: [phone] (mobile) E-mail: [email or web address]  [id number] [id number]: [phone]: [phone]  Pre-screening  Mr. MNobletoffered "in-person" vs "virtual" encounter. He indicated preferring virtual for this encounter.   Reason COVID-19*  Social distancing based on CDC and AMA recommendations.   I contacted JPilar Jarvison 04/05/2020 via telephone.      I clearly identified myself as BGillis Santa MD. I verified that I was speaking with the correct person using two identifiers (Name: JWENDY MIKLES and date of birth: [birth year].  Consent I sought verbal advanced consent from JPilar Jarvisfor virtual visit interactions. I informed Mr. MHandsof possible security and privacy concerns, risks, and limitations associated with providing "not-in-person" medical evaluation and management services. I also informed Mr. MYuanof the availability of "in-person" appointments. Finally, I informed him that there would be a charge  for the virtual visit and that he could be  personally, fully or partially, financially responsible for it. Mr. MDingleyexpressed understanding and agreed to proceed.   Historic Elements   Mr. JJYDEN KROMERis a 59y.o. year old, male patient evaluated today after our last contact on 02/23/2020. Mr. MLancour has a past medical history of Asthma, Diabetes mellitus without complication ([place], and Hypertension. He also  has a past surgical history that includes Lung surgery; Cholecystectomy; and Colonoscopy with propofol (N/A, 11/18/2018). Mr. MMacqueenhas a current medication list which includes the following prescription(s): amlodipine, celecoxib, diazepam, gabapentin, glipizide, losartan, metformin, metoprolol tartrate, omega-3 fatty acids, vitamin d (ergocalciferol), and vitamin e. He  reports that he has never smoked. He has never used smokeless tobacco. He reports that he does not drink alcohol and does not use drugs. Mr. MFegginsis allergic to other and morphine and related.   HPI  Today, he is being contacted for a post-procedure assessment.  Post-Procedure Evaluation  Procedure (02/23/2020):  Type: Cervical Facet Medial Branch Block(s)  #2 (previously done Jan 2021) Primary Purpose: Therapeutic Region: Posterolateral cervical spine Level: C3, C4, C5,Medial Branch Level(s). Injecting these levels blocks the C3-4, C4-5, cervical facet joints. Laterality: Right  Sedation: Please see nurses note.  Effectiveness during initial hour after procedure(Ultra-Short Term Relief): 100%  Local anesthetic used: Long-acting (4-6 hours) Effectiveness: Defined as any analgesic benefit obtained secondary to the administration of local anesthetics. This carries significant diagnostic value as to the etiological location, or anatomical origin, of the pain. Duration of benefit is expected to coincide with the duration of the local anesthetic used.  Effectiveness during initial 4-6 hours after procedure(Short-Term  Relief): 100%   Long-term benefit: Defined as any relief past the pharmacologic duration of the local  anesthetics.  Effectiveness past the initial 6 hours after procedure(Long-Term Relief): 100% for 2 weeks  Current benefits: Defined as benefit that persist at this time.   Analgesia:  50% improved Function: Mr. Vallier reports improvement in function ROM: Back to baseline   Laboratory Chemistry Profile   Renal Lab Results  Component Value Date   BUN 13 09/24/2017   CREATININE 1.11 09/24/2017   GFRAA >60 09/24/2017   GFRNONAA >60 09/24/2017     Hepatic Lab Results  Component Value Date   AST 18 09/24/2017   ALT 24 09/24/2017   ALBUMIN 3.8 09/24/2017   ALKPHOS 62 09/24/2017     Electrolytes Lab Results  Component Value Date   NA 141 09/24/2017   K 4.1 09/24/2017   CL 106 09/24/2017   CALCIUM 9.7 09/24/2017     Bone No results found for: VD25OH, VD125OH2TOT, QA0601VI1, BP7943EX6, 25OHVITD1, 25OHVITD2, 25OHVITD3, TESTOFREE, TESTOSTERONE   Inflammation (CRP: Acute Phase) (ESR: Chronic Phase) No results found for: CRP, ESRSEDRATE, LATICACIDVEN     Note: Above Lab results reviewed.   Assessment  The primary encounter diagnosis was Cervical facet joint syndrome (C2,3,4,5). Diagnoses of Chronic pain syndrome and Arthropathy of cervical facet joint were also pertinent to this visit.  Plan of Care  Mr. OLAOLUWA GRIEDER has a current medication list which includes the following long-term medication(s): amlodipine, gabapentin, glipizide, losartan, metformin, and metoprolol tartrate.  Orders:  Orders Placed This Encounter  Procedures  . CERVICAL FACET (MEDIAL BRANCH NERVE BLOCK)     CLINICAL INDICATIONS: Neck pain. Indications: Cervical Facet Syndrome.    Standing Status:   Standing    Number of Occurrences:   2    Standing Expiration Date:   04/05/2021    Scheduling Instructions:     LATERALITY: Right     Level: C3-4, C4-5,Facet joints (C3, C4, C5,  Medial Branch Nerves)      SEDATION: Patient's choice.     TIMEFRAME: PRN procedure. (Mr. Stairs will call when needed.)    Order Specific Question:   Where will this procedure be performed?    Answer:   [facility]   Follow-up plan:   Return for As needed right C3, C4, C5 cervical facet medial branch nerve block.     Status post right C3, C4, C5 RFA on 12/16/2018, left 01/13/2019.  Better benefit with fct blocks vs RFA, repeat c-fct blocks q3-4 months prn      Recent Visits Date Type Provider Dept  02/23/20 Procedure visit Gillis Santa, MD [facility]  Showing recent visits within past 90 days and meeting all other requirements Today's Visits Date Type Provider Dept  04/05/20 Telemedicine Gillis Santa, MD [facility]  Showing today's visits and meeting all other requirements Future Appointments No visits were found meeting these conditions. Showing future appointments within next 90 days and meeting all other requirements  I discussed the assessment and treatment plan with the patient. The patient was provided an opportunity to ask questions and all were answered. The patient agreed with the plan and demonstrated an understanding of the instructions.  Patient advised to call back or seek an in-person evaluation if the symptoms or condition worsens.  Duration of encounter: 15 minutes.  Note by: Gillis Santa, MD Date: 04/05/2020; Time: 9:33 AM

## 2020-07-12 ENCOUNTER — Other Ambulatory Visit: Payer: Self-pay | Admitting: Urology

## 2020-07-12 DIAGNOSIS — R972 Elevated prostate specific antigen [PSA]: Secondary | ICD-10-CM

## 2020-07-25 ENCOUNTER — Ambulatory Visit
Admission: RE | Admit: 2020-07-25 | Discharge: 2020-07-25 | Disposition: A | Discharge status: Home / Self Care | Payer: BC Managed Care – PPO | Source: Ambulatory Visit | Attending: Urology | Admitting: Urology

## 2020-07-25 ENCOUNTER — Other Ambulatory Visit: Payer: Self-pay

## 2020-07-25 DIAGNOSIS — R972 Elevated prostate specific antigen [PSA]: Secondary | ICD-10-CM | POA: Insufficient documentation

## 2020-07-25 MED ORDER — GADOBUTROL 1 MMOL/ML IV SOLN
9.0000 mL | Freq: Once | INTRAVENOUS | Status: AC | PRN
  Administered 2020-07-25: 9 mL via INTRAVENOUS

## 2020-08-17 ENCOUNTER — Inpatient Hospital Stay: Admission: RE | Admit: 2020-08-17 | Payer: BC Managed Care – PPO | Source: Ambulatory Visit

## 2020-08-22 ENCOUNTER — Other Ambulatory Visit: Payer: BC Managed Care – PPO

## 2020-09-12 NOTE — H&P (Addendum)
NAMEDIETER, HANE MEDICAL RECORD NO: [id number] ACCOUNT NO: [id number] DATE OF BIRTH: May 25, 1960 PHYSICIAN: Otelia Limes. Yves Dill, MD  History and Physical   DATE OF ADMISSION: 09/21/2020  Same day surgery 09/21/2020.  CHIEF COMPLAINT:  Elevated PSA and abnormal prostate MRI scan.  HISTORY OF PRESENT ILLNESS:  The patient is a 60 year old [place] male, found to have an elevated PSA of 5.5 ng/mL.  This was subsequently evaluated with prostate MRI scan on 04/12 indicating a prostate volume of 36.79 mL. He also had a PI-RADS  category 5 lesion on the left side and another PI-RADS category 4 lesion on the right side.  He comes in now for UroNav fusion biopsy of the prostate.  PAST MEDICAL HISTORY  ALLERGIES:  THE PATIENT IS ALLERGIC TO MORPHINE.  CURRENT MEDICATIONS:  Metformin, glipizide, amlodipine, metoprolol, vitamin D, losartan, Dulera, gabapentin, and chlorthalidone.  PAST SURGICAL HISTORY:    1.  Ligation of thoracic duct in 1995. 2.  Cholecystectomy in 2002.  PAST AND CURRENT MEDICAL CONDITIONS:    1.  Hypertension. 2.  Hypercholesterolemia. 3.  COPD. 4.  Diabetes. 5.  Chronic right shoulder pain. 6.  Sleep apnea.  REVIEW OF SYSTEMS:  The patient denies chest pain, shortness of breath, stroke, or heart disease.  SOCIAL HISTORY:  The patient denies tobacco or alcohol use.  FAMILY HISTORY:  Father died at age 76 of unknown reasons.  Mother died at age 11 of unknown reasons.  There is no family history of prostate cancer.  PHYSICAL EXAMINATION: VITAL SIGNS:  Height was 5 feet 8 inches, weight 193 pounds, BMI 29. GENERAL:  Well-nourished Afro American male in no acute distress. HEENT:  Sclerae are clear.  Pupils are equally round, reactive to light.  Extraocular motions are intact. NECK:  No palpable masses and no audible carotid bruits. LYMPHATIC:  No palpable cervical or inguinal adenopathy. PULMONARY:  Lungs clear to auscultation. CARDIOVASCULAR:  Regular rhythm  and rate. ABDOMEN:  Soft, nontender abdomen.  No CVA tenderness. GENITOURINARY:  He was circumcised.  Testes were smooth, nontender, approximately 20 mL in size each. RECTAL: 20 gram smooth, nontender prostate. NEUROMUSCULAR:  Alert and oriented x3.  Exosome IntelliScore was 20.47, which was above the cut off for higher risk of high-grade prostate cancer.  IMPRESSION:    1.  Elevated PSA. 2.  High-grade PI-RADS lesions of the prostate. 3.  Elevated exosome IntelliScore.  PLAN:  UroNav fusion biopsy of prostate.   ROH D: 09/07/2020 5:46:18 pm T: 09/07/2020 6:48:00 pm  JOB: [id number]/ [id number]

## 2020-09-13 ENCOUNTER — Other Ambulatory Visit: Payer: Self-pay

## 2020-09-13 ENCOUNTER — Other Ambulatory Visit
Admission: RE | Admit: 2020-09-13 | Discharge: 2020-09-13 | Disposition: A | Discharge status: Home / Self Care | Payer: BC Managed Care – PPO | Source: Ambulatory Visit | Attending: Urology | Admitting: Urology

## 2020-09-13 HISTORY — DX: Sleep apnea, unspecified: G47.30

## 2020-09-13 NOTE — Patient Instructions (Addendum)
Your procedure is scheduled on: Thursday September 22, 2020. Report to Day Surgery inside [facility] 2nd floor (top by admissions desk first before getting on elevator). To find out your arrival time please call [phone] between 1PM - 3PM on Wednesday September 21, 2020.  Remember: Instructions that are not followed completely may result in serious medical risk,  up to and including death, or upon the discretion of your surgeon and anesthesiologist your  surgery may need to be rescheduled.     _X__ 1. Do not eat food after midnight the night before your procedure.                 No chewing gum or hard candies. You may drink clear liquids up to 2 hours                 before you are scheduled to arrive for your surgery- DO not drink clear                 liquids within 2 hours of the start of your surgery.                 Clear Liquids include:  water, apple juice without pulp, clear Gatorade, G2 or                  Gatorade Zero (avoid Red/Purple/Blue), Black Coffee or Tea (Do not add                 anything to coffee or tea).  __X__2.  On the morning of surgery brush your teeth with toothpaste and water, you                may rinse your mouth with mouthwash if you wish.  Do not swallow any toothpaste of mouthwash.     _X__ 3.  No Alcohol for 24 hours before or after surgery.   _X__ 4.  Do Not Smoke or use e-cigarettes For 24 Hours Prior to Your Surgery.                 Do not use any chewable tobacco products for at least 6 hours prior to                 Surgery.  _X__  5.  Do not use any recreational drugs (marijuana, cocaine, heroin, ecstasy, MDMA or other)                For at least one week prior to your surgery.  Combination of these drugs with anesthesia                May have life threatening results..   __X__ 6.  Notify your doctor if there is any change in your medical condition      (cold, fever, infections).     Do not wear jewelry, make-up,  hairpins, clips or nail polish. Do not wear lotions, powders, or perfumes. You may wear deodorant. Do not shave 48 hours prior to surgery. Men may shave face and neck. Do not bring valuables to the hospital.    [facility] is not responsible for any belongings or valuables.  Contacts, dentures or bridgework may not be worn into surgery. Leave your suitcase in the car. After surgery it may be brought to your room. For patients admitted to the hospital, discharge time is determined by your treatment team.   Patients discharged the day of surgery will not be allowed to  drive home.   Make arrangements for someone to be with you for the first 24 hours of your Same Day Discharge.    __X__ Take these medicines the morning of surgery with A SIP OF WATER:    1. amLODipine (NORVASC) 10 MG   2. atorvastatin (LIPITOR) 10 MG  3. acetaminophen (TYLENOL) 500 MG (if needed)  4.  5.  6.  __X__ Fleet Enema (as directed)   ____ Use CHG Soap (or wipes) as directed  ____ Use Benzoyl Peroxide Gel as instructed  __X__ Use inhalers on the day of surgery mometasone-formoterol (DULERA) 100-5 MCG/ACT AERO   __X__ Stop metFORMIN (GLUCOPHAGE) 1000 2 days prior to surgery (last day to take will be Monday 09/18/20)    ____ Take 1/2 of usual insulin dose the night before surgery. No insulin the morning          of surgery.   ____ Call your PCP, cardiologist, or Pulmonologist if taking Coumadin/Plavix/aspirin and ask when to stop before your surgery.   __X__ One Week prior to surgery- Stop Anti-inflammatories such as Ibuprofen, Aleve, Advil, Motrin, meloxicam (MOBIC), diclofenac, etodolac, ketorolac, Toradol, Daypro, piroxicam, Goody's or BC powders. OK TO USE TYLENOL IF NEEDED   __X__ Stop supplements until after surgery.  Omega-3 Fatty Acids (FISH OIL) and Beet Root Extract   __X__ Bring C-Pap to the hospital.    If you have any questions regarding your pre-procedure instructions,  Please call  Pre-admit Testing at [phone].

## 2020-09-15 ENCOUNTER — Other Ambulatory Visit
Admission: RE | Admit: 2020-09-15 | Discharge: 2020-09-15 | Disposition: A | Discharge status: Home / Self Care | Payer: BC Managed Care – PPO | Source: Ambulatory Visit | Attending: Urology | Admitting: Urology

## 2020-09-15 ENCOUNTER — Other Ambulatory Visit: Payer: Self-pay

## 2020-09-15 DIAGNOSIS — I10 Essential (primary) hypertension: Secondary | ICD-10-CM | POA: Insufficient documentation

## 2020-09-15 DIAGNOSIS — Z01818 Encounter for other preprocedural examination: Secondary | ICD-10-CM | POA: Diagnosis not present

## 2020-09-15 LAB — CBC
HCT: 42.1 % (ref 39.0–52.0)
Hemoglobin: 14.6 g/dL (ref 13.0–17.0)
MCH: 31.1 pg (ref 26.0–34.0)
MCHC: 34.7 g/dL (ref 30.0–36.0)
MCV: 89.6 fL (ref 80.0–100.0)
Platelets: 323 10*3/uL (ref 150–400)
RBC: 4.7 MIL/uL (ref 4.22–5.81)
RDW: 11.9 % (ref 11.5–15.5)
WBC: 5.3 10*3/uL (ref 4.0–10.5)
nRBC: 0 % (ref 0.0–0.2)

## 2020-09-15 LAB — BASIC METABOLIC PANEL
Anion gap: 11 (ref 5–15)
BUN: 18 mg/dL (ref 6–20)
CO2: 27 mmol/L (ref 22–32)
Calcium: 9.6 mg/dL (ref 8.9–10.3)
Chloride: 100 mmol/L (ref 98–111)
Creatinine, Ser: 0.94 mg/dL (ref 0.61–1.24)
GFR, Estimated: >60 mL/min (ref >60)
Glucose, Bld: 171 mg/dL — ABNORMAL HIGH (ref 70–99)
Potassium: 3.3 mmol/L — ABNORMAL LOW (ref 3.5–5.1)
Sodium: 138 mmol/L (ref 135–145)

## 2020-09-19 ENCOUNTER — Other Ambulatory Visit: Payer: BC Managed Care – PPO

## 2020-09-21 ENCOUNTER — Ambulatory Visit: Payer: BC Managed Care – PPO | Admitting: Urgent Care

## 2020-09-21 ENCOUNTER — Ambulatory Visit
Admission: RE | Admit: 2020-09-21 | Discharge: 2020-09-21 | Disposition: A | Discharge status: Home / Self Care | Payer: BC Managed Care – PPO | Attending: Urology | Admitting: Urology

## 2020-09-21 ENCOUNTER — Encounter: Payer: Self-pay | Admitting: Urology

## 2020-09-21 ENCOUNTER — Encounter
Admission: RE | Disposition: A | Discharge status: Home / Self Care | Payer: Self-pay | Source: Home / Self Care | Attending: Urology

## 2020-09-21 DIAGNOSIS — Z885 Allergy status to narcotic agent status: Secondary | ICD-10-CM | POA: Insufficient documentation

## 2020-09-21 DIAGNOSIS — Z7984 Long term (current) use of oral hypoglycemic drugs: Secondary | ICD-10-CM | POA: Insufficient documentation

## 2020-09-21 DIAGNOSIS — N4231 Prostatic intraepithelial neoplasia: Secondary | ICD-10-CM | POA: Diagnosis not present

## 2020-09-21 DIAGNOSIS — C61 Malignant neoplasm of prostate: Secondary | ICD-10-CM | POA: Insufficient documentation

## 2020-09-21 DIAGNOSIS — Z79899 Other long term (current) drug therapy: Secondary | ICD-10-CM | POA: Diagnosis not present

## 2020-09-21 DIAGNOSIS — Z7951 Long term (current) use of inhaled steroids: Secondary | ICD-10-CM | POA: Diagnosis not present

## 2020-09-21 DIAGNOSIS — R972 Elevated prostate specific antigen [PSA]: Secondary | ICD-10-CM | POA: Diagnosis present

## 2020-09-21 HISTORY — PX: PROSTATE BIOPSY: SHX241

## 2020-09-21 LAB — POCT I-STAT, CHEM 8
BUN: 15 mg/dL (ref 6–20)
Calcium, Ion: 1.27 mmol/L (ref 1.15–1.40)
Chloride: 102 mmol/L (ref 98–111)
Creatinine, Ser: 0.9 mg/dL (ref 0.61–1.24)
Glucose, Bld: 167 mg/dL — ABNORMAL HIGH (ref 70–99)
HCT: 44 % (ref 39.0–52.0)
Hemoglobin: 15 g/dL (ref 13.0–17.0)
Potassium: 3.3 mmol/L — ABNORMAL LOW (ref 3.5–5.1)
Sodium: 139 mmol/L (ref 135–145)
TCO2: 25 mmol/L (ref 22–32)

## 2020-09-21 LAB — GLUCOSE, CAPILLARY: Glucose-Capillary: 144 mg/dL — ABNORMAL HIGH (ref 70–99)

## 2020-09-21 SURGERY — BIOPSY, PROSTATE
Anesthesia: General

## 2020-09-21 MED ORDER — OXYCODONE HCL 5 MG/5ML PO SOLN: 5.0000 mg | Freq: Once | ORAL | Status: DC | PRN

## 2020-09-21 MED ORDER — MIDAZOLAM HCL 2 MG/2ML IJ SOLN
INTRAMUSCULAR | Status: AC
  Filled 2020-09-21: qty 2

## 2020-09-21 MED ORDER — SODIUM CHLORIDE 0.9 % IV SOLN
INTRAVENOUS | Status: AC
  Filled 2020-09-21: qty 10

## 2020-09-21 MED ORDER — DEXMEDETOMIDINE (PRECEDEX) IN NS 20 MCG/5ML (4 MCG/ML) IV SYRINGE
PREFILLED_SYRINGE | INTRAVENOUS | Status: AC
  Filled 2020-09-21: qty 5

## 2020-09-21 MED ORDER — MIDAZOLAM HCL 2 MG/2ML IJ SOLN
INTRAMUSCULAR | Status: DC | PRN
  Administered 2020-09-21: 2 mg via INTRAVENOUS

## 2020-09-21 MED ORDER — ONDANSETRON HCL 4 MG/2ML IJ SOLN
4.0000 mg | Freq: Once | INTRAMUSCULAR | Status: DC | PRN
Start: 2020-09-21 — End: 2020-09-21

## 2020-09-21 MED ORDER — FAMOTIDINE 20 MG PO TABS
20.0000 mg | ORAL_TABLET | Freq: Once | ORAL | Status: AC
  Administered 2020-09-21: 20 mg via ORAL

## 2020-09-21 MED ORDER — ORAL CARE MOUTH RINSE: 15.0000 mL | Freq: Once | OROMUCOSAL | Status: AC

## 2020-09-21 MED ORDER — LEVOFLOXACIN 500 MG PO TABS: 500.0000 mg | ORAL_TABLET | Freq: Every day | ORAL | 1 refills | Status: DC

## 2020-09-21 MED ORDER — PROPOFOL 10 MG/ML IV BOLUS
INTRAVENOUS | Status: DC | PRN
  Administered 2020-09-21: 200 mg via INTRAVENOUS

## 2020-09-21 MED ORDER — SODIUM CHLORIDE 0.9 % IV SOLN
1.0000 g | Freq: Once | INTRAVENOUS | Status: AC
  Administered 2020-09-21: 1 g via INTRAVENOUS

## 2020-09-21 MED ORDER — ROCURONIUM BROMIDE 100 MG/10ML IV SOLN
INTRAVENOUS | Status: DC | PRN
  Administered 2020-09-21: 50 mg via INTRAVENOUS

## 2020-09-21 MED ORDER — FENTANYL CITRATE (PF) 100 MCG/2ML IJ SOLN
INTRAMUSCULAR | Status: AC
  Filled 2020-09-21: qty 2

## 2020-09-21 MED ORDER — MEPERIDINE HCL 25 MG/ML IJ SOLN: 6.2500 mg | INTRAMUSCULAR | Status: DC | PRN

## 2020-09-21 MED ORDER — CHLORHEXIDINE GLUCONATE 0.12 % MT SOLN: 15.0000 mL | Freq: Once | OROMUCOSAL | Status: AC

## 2020-09-21 MED ORDER — CHLORHEXIDINE GLUCONATE 0.12 % MT SOLN
OROMUCOSAL | Status: AC
  Administered 2020-09-21: 15 mL via OROMUCOSAL
  Filled 2020-09-21: qty 15

## 2020-09-21 MED ORDER — LIDOCAINE HCL (CARDIAC) PF 100 MG/5ML IV SOSY
PREFILLED_SYRINGE | INTRAVENOUS | Status: DC | PRN
  Administered 2020-09-21: 100 mg via INTRAVENOUS

## 2020-09-21 MED ORDER — ONDANSETRON HCL 4 MG/2ML IJ SOLN
INTRAMUSCULAR | Status: DC | PRN
  Administered 2020-09-21: 4 mg via INTRAVENOUS

## 2020-09-21 MED ORDER — LACTATED RINGERS IV SOLN: INTRAVENOUS | Status: DC | PRN

## 2020-09-21 MED ORDER — FENTANYL CITRATE (PF) 100 MCG/2ML IJ SOLN: 25.0000 ug | INTRAMUSCULAR | Status: DC | PRN

## 2020-09-21 MED ORDER — FENTANYL CITRATE (PF) 100 MCG/2ML IJ SOLN
INTRAMUSCULAR | Status: DC | PRN
  Administered 2020-09-21: 100 ug via INTRAVENOUS

## 2020-09-21 MED ORDER — SODIUM CHLORIDE 0.9 % IV SOLN: INTRAVENOUS | Status: DC

## 2020-09-21 MED ORDER — FLEET ENEMA 7-19 GM/118ML RE ENEM: 1.0000 | ENEMA | Freq: Once | RECTAL | Status: DC

## 2020-09-21 MED ORDER — GENTAMICIN IN SALINE 1.6-0.9 MG/ML-% IV SOLN
80.0000 mg | Freq: Once | INTRAVENOUS | Status: AC
  Administered 2020-09-21: 80 mg via INTRAVENOUS
  Filled 2020-09-21: qty 50

## 2020-09-21 MED ORDER — SUGAMMADEX SODIUM 200 MG/2ML IV SOLN
INTRAVENOUS | Status: DC | PRN
  Administered 2020-09-21: 200 mg via INTRAVENOUS

## 2020-09-21 MED ORDER — ACETAMINOPHEN 10 MG/ML IV SOLN
INTRAVENOUS | Status: AC
  Filled 2020-09-21: qty 100

## 2020-09-21 MED ORDER — FAMOTIDINE 20 MG PO TABS
ORAL_TABLET | ORAL | Status: AC
  Filled 2020-09-21: qty 1

## 2020-09-21 MED ORDER — LACTATED RINGERS IV SOLN: INTRAVENOUS | Status: DC

## 2020-09-21 MED ORDER — OXYCODONE HCL 5 MG PO TABS: 5.0000 mg | ORAL_TABLET | Freq: Once | ORAL | Status: DC | PRN

## 2020-09-21 MED ORDER — DEXAMETHASONE SODIUM PHOSPHATE 10 MG/ML IJ SOLN
INTRAMUSCULAR | Status: DC | PRN
  Administered 2020-09-21: 10 mg via INTRAVENOUS

## 2020-09-21 MED ORDER — ACETAMINOPHEN 10 MG/ML IV SOLN
INTRAVENOUS | Status: DC | PRN
  Administered 2020-09-21: 1000 mg via INTRAVENOUS

## 2020-09-21 SURGICAL SUPPLY — 17 items
COVER MAYO STAND REUSABLE (DRAPES) ×2 IMPLANT
COVER TRANSDUCER ULTRASOUND (MISCELLANEOUS) ×2 IMPLANT
GLOVE SURG ENC MOIS LTX SZ7 (GLOVE) ×4 IMPLANT
GUIDE NEEDLE ENDOCAV 16-18 CVR (NEEDLE) IMPLANT
GUIDE NEEDLE URONAV ULTRASND S (MISCELLANEOUS) ×1 IMPLANT
INST BIOPSY MAXCORE 18GX25 (NEEDLE) ×2 IMPLANT
MANIFOLD NEPTUNE II (INSTRUMENTS) IMPLANT
NEEDLE GUIDE BIOPSY 644068 (NEEDLE) IMPLANT
PROBE BIOSP ALOKA ALPHA6 PROST (MISCELLANEOUS) ×2 IMPLANT
PROBE URONAV BK 8808E 8818 HLD (MISCELLANEOUS) ×1 IMPLANT
STRAP SAFETY 5IN WIDE (MISCELLANEOUS) ×2 IMPLANT
SURGILUBE 2OZ TUBE FLIPTOP (MISCELLANEOUS) ×2 IMPLANT
TOWEL OR 17X26 4PK STRL BLUE (TOWEL DISPOSABLE) ×2 IMPLANT
URONAV BK 8808E 8818 PROBE HLD (MISCELLANEOUS) ×2
URONAV MRI FUSION TWO PATIENTS (MISCELLANEOUS) ×2 IMPLANT
URONAV ULTRASOUND (MISCELLANEOUS) ×2 IMPLANT
URONAV ULTRASOUND NDL GUIDE S (MISCELLANEOUS) ×2

## 2020-09-21 NOTE — Op Note (Signed)
Preoperative diagnosis: 1.  Elevated PSA (R97.2)                                           2.  Abnormal prostate gland MRI scan (D40.0)  Postoperative diagnosis: Same  Procedure: Transrectal Uronav fusion biopsy of the prostate gland ([facility] [place], [phone])  Surgeon: Otelia Limes. Yves Dill MD  Anesthesia: General  Indications:See the history and physical. After informed consent the above procedure(s) were requested     Technique and findings: After adequate general anesthesia been obtained the patient was placed into left lateral decubitus position.  Finger sweep of the rectum was performed and the rectal vault was noted to be clear.  The ultrasound probe was placed and ultrasound images acquired.  The ultrasound images were then fused with the MRI images.  Region of interest #1 was identified on the left side and 3 core biopsies taken.  Region of interest #2 on the right side was identified and 3 core biopsies taken here.  At this point standard 12 core systematic core biopsies were performed.  The ultrasound probe was removed.  Blood loss was minimal.  The procedure was then terminated and patient transferred to the recovery room in stable condition.

## 2020-09-21 NOTE — Transfer of Care (Signed)
Immediate Anesthesia Transfer of Care Note  Patient: Tony Benson  Procedure(s) Performed: PROSTATE BIOPSY URONAV  Patient Location: PACU  Anesthesia Type:General  Level of Consciousness: drowsy  Airway & Oxygen Therapy: Patient Spontanous Breathing and Patient connected to face mask oxygen  Post-op Assessment: Report given to RN and Post -op Vital signs reviewed and stable  Post vital signs: Reviewed and stable  Last Vitals:  Vitals Value Taken Time  BP 128/77   Temp    Pulse 89 09/21/20 1020  Resp 22 09/21/20 1020  SpO2 100 % 09/21/20 1020  Vitals shown include unvalidated device data.  Last Pain:  Vitals:   09/21/20 0826  TempSrc: Temporal  PainSc: 5          Complications: No notable events documented.

## 2020-09-21 NOTE — Discharge Instructions (Addendum)
Transrectal Ultrasound-Guided Prostate Biopsy, Care After This sheet gives you information about how to care for yourself after your procedure. Your doctor may also give you more specific instructions. If you have problems or questions, contact your doctor. What can I expect after the procedure? After the procedure, it is common to have: Pain and discomfort in your butt, especially while sitting. Pink-colored pee (urine), due to small amounts of blood in the pee. Burning while peeing (urinating). Blood in your poop (stool). Bleeding from your butt. Blood in your semen. Follow these instructions at home: Medicines Take over-the-counter and prescription medicines only as told by your doctor. If you were prescribed antibiotic medicine, take it as told by your doctor. Do not stop taking the antibiotic even if you start to feel better.     Activity Do not drive for 24 hours if you were given a medicine to help you relax (sedative) during your procedure. Return to your normal activities as told by your doctor. Ask your doctor what activities are safe for you. Ask your doctor when it is okay for you to have sex. Do not lift anything that is heavier than 10 lb (4.5 kg), or the limit that you are told, until your doctor says that it is safe.   General instructions Drink enough water to keep your pee pale yellow. Watch your pee, poop, and semen for new bleeding or bleeding that gets worse. Keep all follow-up visits as told by your doctor. This is important.   Contact a doctor if you: Have blood clots in your pee or poop. Notice that your pee smells bad or unusual. Have very bad belly pain. Have trouble peeing. Notice that your lower belly feels firm. Have blood in your pee for more than 2 weeks after the procedure. Have blood in your semen for more than 2 months after the procedure. Have problems getting an erection. Feel sick to your stomach (nauseous) or throw up (vomit). Have new or worse  bleeding in your pee, poop, or semen. Get help right away if you: Have a fever or chills. Have bright red pee. Have very bad pain that does not get better with medicine. Cannot pee. Summary After this procedure, it is common to have pain and discomfort around your butt, especially while sitting. You may have blood in your pee and poop. It is common to have blood in your semen for 1-2 months. If you were prescribed antibiotic medicine, take it as told by your doctor. Do not stop taking the antibiotic even if you start to feel better. Get help right away if you have a fever or chills. This information is not intended to replace advice given to you by your health care provider. Make sure you discuss any questions you have with your health care provider. Document Revised: 02/14/2020 Document Reviewed: 12/16/2019 Elsevier Patient Education  2021 [place]   The drugs that you were given will stay in your system until tomorrow so for the next 24 hours you should not:  Drive an automobile Make any legal decisions Drink any alcoholic beverage   You may resume regular meals tomorrow.  Today it is better to start with liquids and gradually work up to solid foods.  You may eat anything you prefer, but it is better to start with liquids, then soup and crackers, and gradually work up to solid foods.   Please notify your doctor immediately if you have any unusual bleeding,  trouble breathing, redness and pain at the surgery site, drainage, fever, or pain not relieved by medication.    Additional Instructions:        Please contact your physician with any problems or Same Day Surgery at [phone], Monday through Friday 6 am to 4 pm, or [place] at [facility] number at [phone].

## 2020-09-21 NOTE — Anesthesia Preprocedure Evaluation (Addendum)
Anesthesia Evaluation  Patient identified by MRN, date of birth, ID band Patient awake    History of Anesthesia Complications Negative for: history of anesthetic complications  Airway Mallampati: II  TM Distance: >3 FB     Dental no notable dental hx. (+) Teeth Intact   Pulmonary asthma (last flare 1 yr ago; steroids > 1 yr) , sleep apnea and Continuous Positive Airway Pressure Ventilation ,    Pulmonary exam normal breath sounds clear to auscultation       Cardiovascular Exercise Tolerance: Good METS: 3 - Mets hypertension (controlled), Pt. on medications Normal cardiovascular exam Rhythm:Regular Rate:Normal     Neuro/Psych negative neurological ROS  negative psych ROS   GI/Hepatic negative GI ROS, Neg liver ROS,   Endo/Other  diabetes, Well Controlled  Renal/GU negative Renal ROS  negative genitourinary   Musculoskeletal negative musculoskeletal ROS (+)   Abdominal Normal abdominal exam  (+)   Peds  Hematology negative hematology ROS (+)   Anesthesia Other Findings   Reproductive/Obstetrics                            Anesthesia Physical Anesthesia Plan  ASA: 2  Anesthesia Plan: General   Post-op Pain Management:    Induction: Intravenous  PONV Risk Score and Plan: Ondansetron  Airway Management Planned: Oral ETT  Additional Equipment:   Intra-op Plan:   Post-operative Plan: Extubation in OR  Informed Consent: I have reviewed the patients History and Physical, chart, labs and discussed the procedure including the risks, benefits and alternatives for the proposed anesthesia with the patient or authorized representative who has indicated his/her understanding and acceptance.       Plan Discussed with: CRNA  Anesthesia Plan Comments: (Avoid morphine d/t allergy)       Anesthesia Quick Evaluation

## 2020-09-21 NOTE — Anesthesia Procedure Notes (Signed)
Procedure Name: Intubation Date/Time: 09/21/2020 9:47 AM Performed by: Lily Peer, Tinzlee Craker, CRNA Pre-anesthesia Checklist: Patient identified, Emergency Drugs available, Suction available and Patient being monitored Patient Re-evaluated:Patient Re-evaluated prior to induction Oxygen Delivery Method: Circle system utilized Preoxygenation: Pre-oxygenation with 100% oxygen Induction Type: IV induction Ventilation: Mask ventilation without difficulty Laryngoscope Size: McGraph and 4 Grade View: Grade I Tube type: Oral Number of attempts: 1 Airway Equipment and Method: Stylet and Oral airway Placement Confirmation: ETT inserted through vocal cords under direct vision, positive ETCO2 and breath sounds checked- equal and bilateral Secured at: 22 cm Tube secured with: Tape Dental Injury: Teeth and Oropharynx as per pre-operative assessment

## 2020-09-22 LAB — SURGICAL PATHOLOGY

## 2020-09-22 NOTE — Anesthesia Postprocedure Evaluation (Signed)
Anesthesia Post Note  Patient: Tony Benson  Procedure(s) Performed: PROSTATE BIOPSY URONAV  Patient location during evaluation: PACU Anesthesia Type: General Level of consciousness: awake and alert Pain management: pain level controlled Vital Signs Assessment: post-procedure vital signs reviewed and stable Respiratory status: spontaneous breathing, nonlabored ventilation, respiratory function stable and patient connected to nasal cannula oxygen Cardiovascular status: blood pressure returned to baseline and stable Postop Assessment: no apparent nausea or vomiting Anesthetic complications: no   No notable events documented.   Last Vitals:  Vitals:   09/21/20 1045 09/21/20 1057  BP: 108/80 120/75  Pulse: 84 75  Resp: (!) 25 16  Temp: (!) 36.2 C (!) 36.1 C  SpO2: 97% 99%    Last Pain:  Vitals:   09/22/20 0813  TempSrc:   PainSc: 0-No pain                 Tonny Bollman

## 2020-09-25 ENCOUNTER — Encounter: Payer: Self-pay | Admitting: Urology

## 2020-12-11 ENCOUNTER — Other Ambulatory Visit: Payer: Self-pay | Admitting: Orthopedic Surgery

## 2020-12-11 DIAGNOSIS — G8929 Other chronic pain: Secondary | ICD-10-CM

## 2020-12-11 DIAGNOSIS — M7918 Myalgia, other site: Secondary | ICD-10-CM

## 2021-05-21 DIAGNOSIS — C61 Malignant neoplasm of prostate: Secondary | ICD-10-CM | POA: Insufficient documentation

## 2021-05-21 DIAGNOSIS — E119 Type 2 diabetes mellitus without complications: Secondary | ICD-10-CM | POA: Insufficient documentation

## 2021-08-17 DIAGNOSIS — Z01818 Encounter for other preprocedural examination: Secondary | ICD-10-CM | POA: Insufficient documentation

## 2022-09-03 LAB — LAB REPORT - SCANNED
A1c: 14.5
EGFR: 86

## 2022-10-30 IMAGING — MR MR PROSTATE WO/W CM
56 series · 56 of 56 positions shown · IV contrast (9ml Gadavist)
Comparison: None.

CLINICAL DATA: Elevated PSA level of 5.5 on 07/20/2020.

EXAM:
MR PROSTATE WITHOUT AND WITH CONTRAST
TECHNIQUE: Multiplanar multisequence MRI images were obtained of the pelvis
centered about the prostate. Pre and post contrast images were
obtained.
CONTRAST:  9mL GADAVIST GADOBUTROL 1 MMOL/ML IV SOLN

[Series 3: ax in&out whole · axial · 6.0mm · 0.74mm/px · 1 of 35 slices shown (1 of 2)]
[im 1/35]
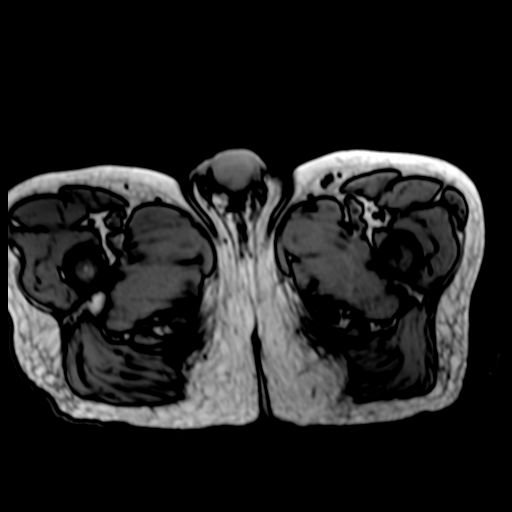

[Series 3: ax in&out whole · axial · 6.0mm · 0.74mm/px · 1 of 35 slices shown (2 of 2)]
[im 1/35]
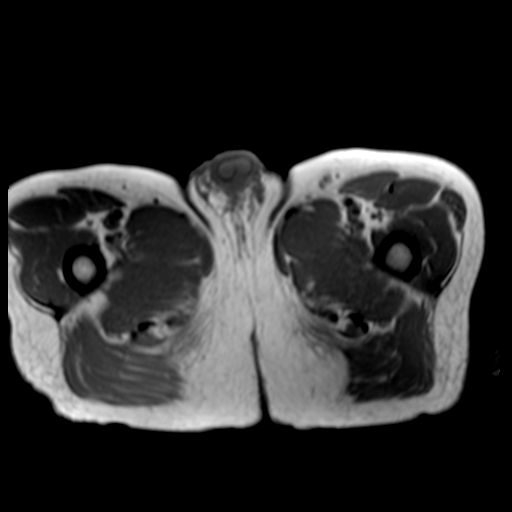

[Series 4: T2 · axial · 3.0mm · 0.56mm/px · 1 of 26 slices shown (1 of 3)]
[im 1/26]
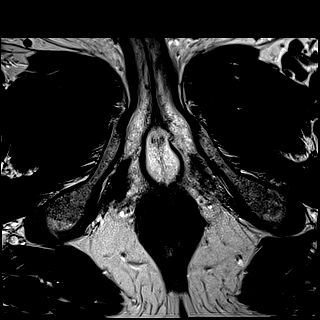

[Series 5: T2 · coronal · 3.0mm · 0.70mm/px · 1 of 35 slices shown (2 of 3)]
[im 1/35]
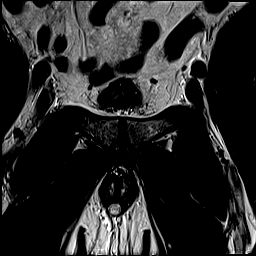

[Series 6: DWI · axial · 3.0mm · 0.86mm/px · 1 of 75 slices shown (1 of 3)]
[im 1/75]
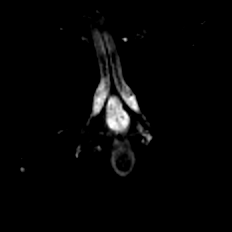

[Series 7: DWI · axial · 3.0mm · 0.86mm/px · 1 of 25 slices shown (2 of 3)]
[im 1/25]
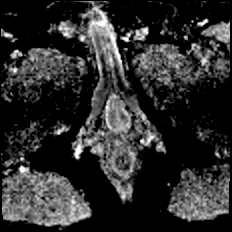

[Series 8: DWI · axial · 3.0mm · 0.86mm/px · 1 of 25 slices shown (3 of 3)]
[im 1/25]
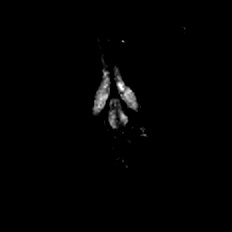

[Series 9: T2 · axial · 1.0mm · 1.04mm/px · 1 of 80 slices shown (3 of 3)]
[im 1/80]
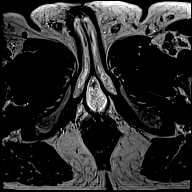

[Series 10: T1 · axial · 3.0mm · 1.15mm/px · 1 of 28 slices shown (1 of 48)]
[im 1/28]
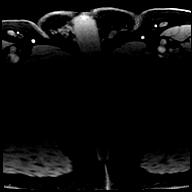

[Series 11: T1 · axial · 3.0mm · 1.15mm/px · 1 of 28 slices shown (2 of 48)]
[im 1/28]
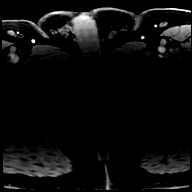

[Series 12: T1 · axial · 3.0mm · 1.15mm/px · 1 of 28 slices shown (3 of 48)]
[im 1/28]
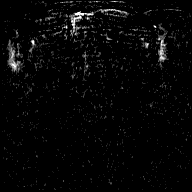

[Series 13: T1 · axial · 3.0mm · 1.15mm/px · 1 of 28 slices shown (4 of 48)]
[im 1/28]
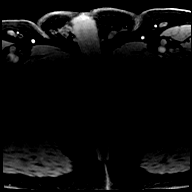

[Series 14: T1 · axial · 3.0mm · 1.15mm/px · 1 of 28 slices shown (5 of 48)]
[im 1/28]
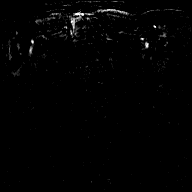

[Series 15: T1 · axial · 3.0mm · 1.15mm/px · 1 of 28 slices shown (6 of 48)]
[im 1/28]
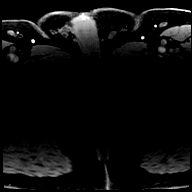

[Series 16: T1 · axial · 3.0mm · 1.15mm/px · 1 of 28 slices shown (7 of 48)]
[im 1/28]
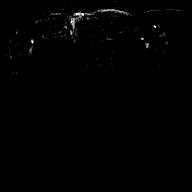

[Series 17: T1 · axial · 3.0mm · 1.15mm/px · 1 of 28 slices shown (8 of 48)]
[im 1/28]
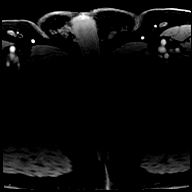

[Series 18: T1 · axial · 3.0mm · 1.15mm/px · 1 of 27 slices shown (9 of 48)]
[im 1/27]
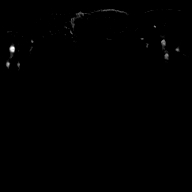

[Series 19: T1 · axial · 3.0mm · 1.15mm/px · 1 of 28 slices shown (10 of 48)]
[im 1/28]
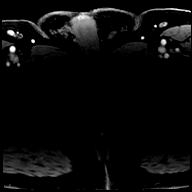

[Series 20: T1 · axial · 3.0mm · 1.15mm/px · 1 of 28 slices shown (11 of 48)]
[im 1/28]
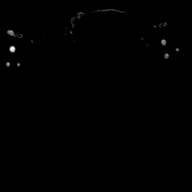

[Series 21: T1 · axial · 3.0mm · 1.15mm/px · 1 of 28 slices shown (12 of 48)]
[im 1/28]
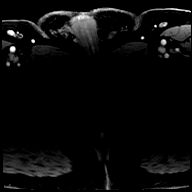

[Series 22: T1 · axial · 3.0mm · 1.15mm/px · 1 of 28 slices shown (13 of 48)]
[im 1/28]
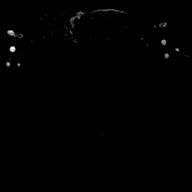

[Series 23: T1 · axial · 3.0mm · 1.15mm/px · 1 of 28 slices shown (14 of 48)]
[im 1/28]
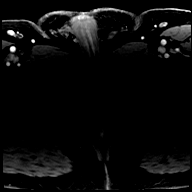

[Series 24: T1 · axial · 3.0mm · 1.15mm/px · 1 of 28 slices shown (15 of 48)]
[im 1/28]
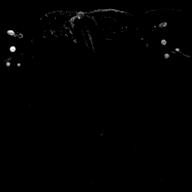

[Series 25: T1 · axial · 3.0mm · 1.15mm/px · 1 of 28 slices shown (16 of 48)]
[im 1/28]
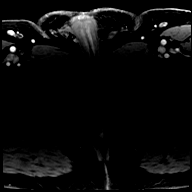

[Series 26: T1 · axial · 3.0mm · 1.15mm/px · 1 of 28 slices shown (17 of 48)]
[im 1/28]
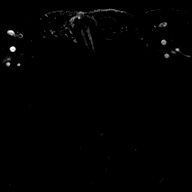

[Series 27: T1 · axial · 3.0mm · 1.15mm/px · 1 of 28 slices shown (18 of 48)]
[im 1/28]
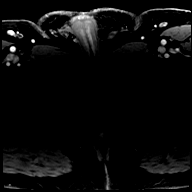

[Series 28: T1 · axial · 3.0mm · 1.15mm/px · 1 of 28 slices shown (19 of 48)]
[im 1/28]
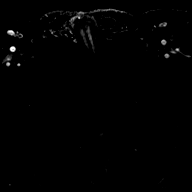

[Series 29: T1 · axial · 3.0mm · 1.15mm/px · 1 of 28 slices shown (20 of 48)]
[im 1/28]
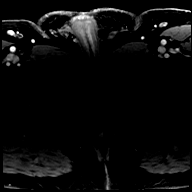

[Series 30: T1 · axial · 3.0mm · 1.15mm/px · 1 of 28 slices shown (21 of 48)]
[im 1/28]
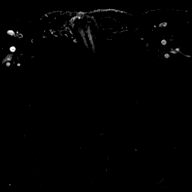

[Series 31: T1 · axial · 3.0mm · 1.15mm/px · 1 of 28 slices shown (22 of 48)]
[im 1/28]
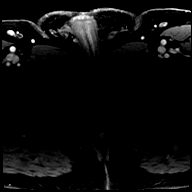

[Series 32: T1 · axial · 3.0mm · 1.15mm/px · 1 of 28 slices shown (23 of 48)]
[im 1/28]
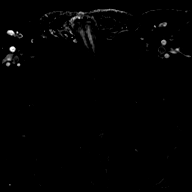

[Series 33: T1 · axial · 3.0mm · 1.15mm/px · 1 of 28 slices shown (24 of 48)]
[im 1/28]
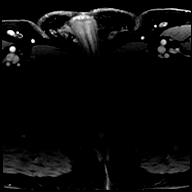

[Series 34: T1 · axial · 3.0mm · 1.15mm/px · 1 of 28 slices shown (25 of 48)]
[im 1/28]
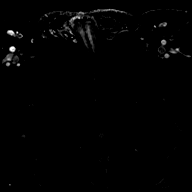

[Series 35: T1 · axial · 3.0mm · 1.15mm/px · 1 of 28 slices shown (26 of 48)]
[im 1/28]
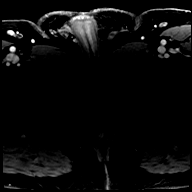

[Series 36: T1 · axial · 3.0mm · 1.15mm/px · 1 of 28 slices shown (27 of 48)]
[im 1/28]
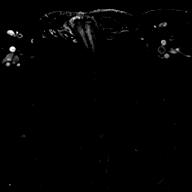

[Series 37: T1 · axial · 3.0mm · 1.15mm/px · 1 of 28 slices shown (28 of 48)]
[im 1/28]
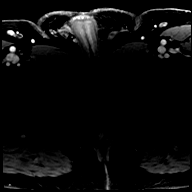

[Series 38: T1 · axial · 3.0mm · 1.15mm/px · 1 of 28 slices shown (29 of 48)]
[im 1/28]
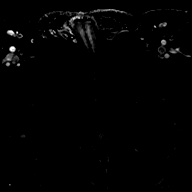

[Series 39: T1 · axial · 3.0mm · 1.15mm/px · 1 of 28 slices shown (30 of 48)]
[im 1/28]
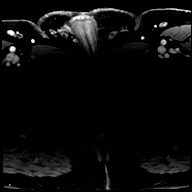

[Series 40: T1 · axial · 3.0mm · 1.15mm/px · 1 of 28 slices shown (31 of 48)]
[im 1/28]
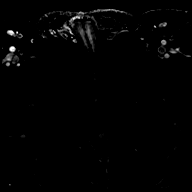

[Series 41: T1 · axial · 3.0mm · 1.15mm/px · 1 of 28 slices shown (32 of 48)]
[im 1/28]
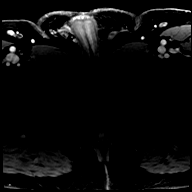

[Series 42: T1 · axial · 3.0mm · 1.15mm/px · 1 of 28 slices shown (33 of 48)]
[im 1/28]
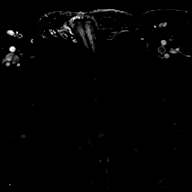

[Series 43: T1 · axial · 3.0mm · 1.15mm/px · 1 of 28 slices shown (34 of 48)]
[im 1/28]
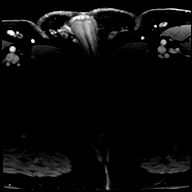

[Series 44: T1 · axial · 3.0mm · 1.15mm/px · 1 of 28 slices shown (35 of 48)]
[im 1/28]
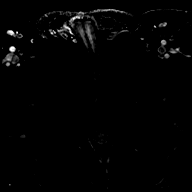

[Series 45: T1 · axial · 3.0mm · 1.15mm/px · 1 of 28 slices shown (36 of 48)]
[im 1/28]
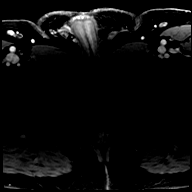

[Series 46: T1 · axial · 3.0mm · 1.15mm/px · 1 of 28 slices shown (37 of 48)]
[im 1/28]
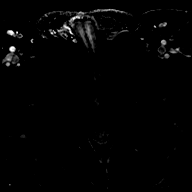

[Series 47: T1 · axial · 3.0mm · 1.15mm/px · 1 of 28 slices shown (38 of 48)]
[im 1/28]
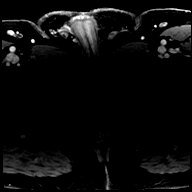

[Series 48: T1 · axial · 3.0mm · 1.15mm/px · 1 of 28 slices shown (39 of 48)]
[im 1/28]
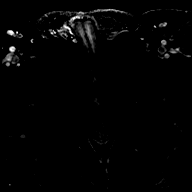

[Series 49: T1 · axial · 3.0mm · 1.15mm/px · 1 of 28 slices shown (40 of 48)]
[im 1/28]
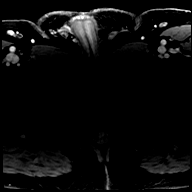

[Series 50: T1 · axial · 3.0mm · 1.15mm/px · 1 of 28 slices shown (41 of 48)]
[im 1/28]
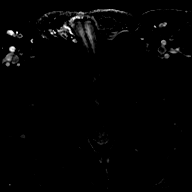

[Series 51: T1 · axial · 3.0mm · 1.15mm/px · 1 of 28 slices shown (42 of 48)]
[im 1/28]
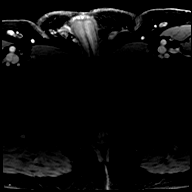

[Series 52: T1 · axial · 3.0mm · 1.15mm/px · 1 of 28 slices shown (43 of 48)]
[im 1/28]
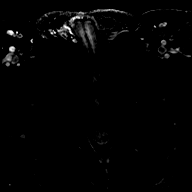

[Series 53: T1 · axial · 3.0mm · 1.15mm/px · 1 of 28 slices shown (44 of 48)]
[im 1/28]
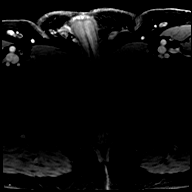

[Series 54: T1 · axial · 3.0mm · 1.15mm/px · 1 of 28 slices shown (45 of 48)]
[im 1/28]
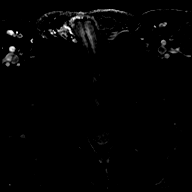

[Series 55: T1 · axial · 3.0mm · 1.15mm/px · 1 of 28 slices shown (46 of 48)]
[im 1/28]
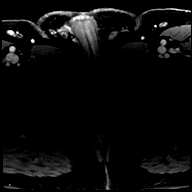

[Series 56: T1 · axial · 3.0mm · 1.15mm/px · 1 of 28 slices shown (47 of 48)]
[im 1/28]
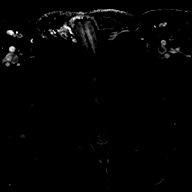

[Series 57: T1 · axial · 3.0mm · 1.15mm/px · 1 of 28 slices shown (48 of 48)]
[im 1/28]
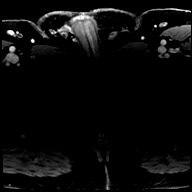

[56 of 56 positions shown; findings below may reference images not displayed]

FINDINGS: Prostate:

Region of interest # 1: PI-RADS category 5 lesion of the left
peripheral zone involving the anterior, posterolateral, and
posteromedial peripheral zone in the apex and base, and the
posteromedial and posterolateral peripheral zone in the mid gland,
likely with involvement of the central zone on the left at the base.
This lesion measures 4.09 cubic cm (2.9 by 1.0 by 2.3 cm) with
appearance concerning for early seminal vesicle invasion, and some
bulging of the posterior prostate contour raising the possibility of
transcapsular spread. Mild indistinctness along left neurovascular
bundle. This lesion has early enhancement and restriction of
diffusion. The lesion is appreciable for example on image 48 of
series 9.

Region of interest # 2: PI-RADS category 4 lesion of the right
posterolateral and posteromedial peripheral zone at the apex with
low T2 signal and mild early enhancement. This measures 0.75 cubic
cm (1.9 by 1.1 by 0.7 cm) and is shown for example on image 50 of
series 9.

Volume: 3D volumetric analysis: Prostate volume 36.79 cubic cm (5.8
by 3.9 by 3.1 cm).

Transcapsular spread: Likely present associated with region of
interest # 1 given the slight bulging capsular contour for example
on image 50 of series 9.

Seminal vesicle involvement: Suspicious for mild left seminal
vesicle involvement associated with region of interest # 1.

Neurovascular bundle involvement: Suspicious for left neurovascular
bundle involvement associated with region of interest # 1

Pelvic adenopathy: Absent

Bone metastasis: Absent

Other findings: No supplemental non-categorized findings.
IMPRESSION: 1. PI-RADS category 5 lesion of the left peripheral zone with
concern for potential transcapsular spread and possibly early
seminal vesicle involvement and neurovascular bundle involvement.
2. PI-RADS category 4 lesion of the right peripheral zone.
3. Targeting data sent to [facility].

## 2022-12-27 ENCOUNTER — Other Ambulatory Visit: Payer: Self-pay

## 2022-12-27 ENCOUNTER — Telehealth: Payer: Self-pay

## 2022-12-27 DIAGNOSIS — Z800 Family history of malignant neoplasm of digestive organs: Secondary | ICD-10-CM

## 2022-12-27 DIAGNOSIS — Z86010 Personal history of colonic polyps: Secondary | ICD-10-CM

## 2022-12-27 MED ORDER — NA SULFATE-K SULFATE-MG SULF 17.5-3.13-1.6 GM/177ML PO SOLN
1.0000 | Freq: Once | ORAL | 0 refills | Status: AC
Start: 2022-12-27 — End: 2022-12-27

## 2022-12-27 NOTE — Telephone Encounter (Signed)
Gastroenterology Pre-Procedure Review  Request Date: 02/05/23 Requesting Physician: Dr. Allegra Lai  PATIENT REVIEW QUESTIONS: The patient responded to the following health history questions as indicated:    1. Are you having any GI issues? no 2. Do you have a personal history of Polyps? yes (Last colonoscopy performed by Dr. Maximino Greenland 11/18/18 recommended repeat in 5 years however family history of colon cancer (mother) colonoscopy q3 years ) 3. Do you have a family history of Colon Cancer or Polyps? yes (mother colon cancer) 4. Diabetes Mellitus? -STOP RYBELSUS (7) DAYS PRIOR TO COLONOSCOPY ON 10/16 -STOP JARDIANCE (3) DAYS PRIOR TO COLONOSCOPY ON 10/20 -STOP METFORMIN (2) DAYS PRIOR TO COLONOSCOPY ON 10/21 -TAKE HALF OF YOUR USUAL DOSE OF TOUJEO THE EVENING BEFORE COLONOSCOPY 5. Joint replacements in the past 12 months?no 6. Major health problems in the past 3 months?no 7. Any artificial heart valves, MVP, or defibrillator?no    MEDICATIONS & ALLERGIES:    Patient reports the following regarding taking any anticoagulation/antiplatelet therapy:   Plavix, Coumadin, Eliquis, Xarelto, Lovenox, Pradaxa, Brilinta, or Effient? no Aspirin? no  Patient confirms/reports the following medications:  Current Outpatient Medications  Medication Sig Dispense Refill   gabapentin (NEURONTIN) 100 MG capsule Take by mouth.     glipiZIDE (GLUCOTROL XL) 5 MG 24 hr tablet Take 5 mg by mouth 2 (two) times daily.     JARDIANCE 25 MG TABS tablet Take 25 mg by mouth daily.     metaxalone (SKELAXIN) 800 MG tablet Take by mouth.     Na Sulfate-K Sulfate-Mg Sulf 17.5-3.13-1.6 GM/177ML SOLN Take 1 kit by mouth once for 1 dose. 354 mL 0   oxybutynin (DITROPAN) 5 MG tablet Take by mouth.     polyethylene glycol (MIRALAX / GLYCOLAX) 17 g packet Take by mouth.     potassium chloride SA (KLOR-CON M20) 20 MEQ tablet Take by mouth.     RYBELSUS 7 MG TABS PLEASE SEE ATTACHED FOR DETAILED DIRECTIONS     senna-docusate  (SENOKOT-S) 8.6-50 MG tablet Take by mouth.     TOUJEO SOLOSTAR 300 UNIT/ML Solostar Pen SMARTSIG:25 Unit(s) SUB-Q Daily     acetaminophen (TYLENOL) 500 MG tablet Take 1,000 mg by mouth every 8 (eight) hours as needed for moderate pain.     amLODipine (NORVASC) 10 MG tablet Take 10 mg by mouth daily.  1   atorvastatin (LIPITOR) 10 MG tablet Take 10 mg by mouth daily.     celecoxib (CELEBREX) 200 MG capsule Take 200 mg by mouth 2 (two) times daily as needed.      chlorthalidone (HYGROTON) 25 MG tablet Take 25 mg by mouth daily.     diazepam (VALIUM) 5 MG tablet Take 5 mg by mouth every 12 (twelve) hours as needed for anxiety.     levofloxacin (LEVAQUIN) 500 MG tablet Take 1 tablet (500 mg total) by mouth daily. 7 tablet 1   losartan (COZAAR) 100 MG tablet Take 100 mg by mouth daily.  1   metFORMIN (GLUCOPHAGE) 1000 MG tablet Take 1,000 mg by mouth 2 (two) times daily with a meal.  (Patient not taking: Reported on 12/27/2022)     mometasone-formoterol (DULERA) 100-5 MCG/ACT AERO Inhale 2 puffs into the lungs 2 (two) times daily as needed for wheezing or shortness of breath.     Omega-3 Fatty Acids (FISH OIL) 1000 MG CAPS Take 1,000 mg by mouth daily.     sildenafil (VIAGRA) 50 MG tablet Take 50 mg by mouth every 3 (three) days.  Vitamin D, Ergocalciferol, (DRISDOL) 50000 units CAPS capsule Take 50,000 Units by mouth every 7 (seven) days.     No current facility-administered medications for this visit.    Patient confirms/reports the following allergies:  Allergies  Allergen Reactions   Other Anaphylaxis    peanuts   Morphine And Codeine Itching    No orders of the defined types were placed in this encounter.   AUTHORIZATION INFORMATION Primary Insurance: 1D#: Group #:  Secondary Insurance: 1D#: Group #:  SCHEDULE INFORMATION: Date: 02/05/23 Time: Location: [facility]

## 2023-02-04 ENCOUNTER — Encounter: Payer: Self-pay | Admitting: Gastroenterology

## 2023-02-04 ENCOUNTER — Other Ambulatory Visit: Payer: Self-pay | Admitting: Surgery

## 2023-02-04 DIAGNOSIS — M542 Cervicalgia: Secondary | ICD-10-CM

## 2023-02-05 ENCOUNTER — Ambulatory Visit
Admission: RE | Admit: 2023-02-05 | Discharge: 2023-02-05 | Disposition: A | Discharge status: Home / Self Care | Payer: BC Managed Care – PPO | Attending: Gastroenterology | Admitting: Gastroenterology

## 2023-02-05 ENCOUNTER — Encounter
Admission: RE | Disposition: A | Discharge status: Home / Self Care | Payer: Self-pay | Source: Home / Self Care | Attending: Gastroenterology

## 2023-02-05 ENCOUNTER — Ambulatory Visit: Payer: BC Managed Care – PPO | Admitting: Certified Registered"

## 2023-02-05 DIAGNOSIS — Z860100 Personal history of colon polyps, unspecified: Secondary | ICD-10-CM

## 2023-02-05 DIAGNOSIS — Z800 Family history of malignant neoplasm of digestive organs: Secondary | ICD-10-CM | POA: Insufficient documentation

## 2023-02-05 DIAGNOSIS — J45909 Unspecified asthma, uncomplicated: Secondary | ICD-10-CM | POA: Insufficient documentation

## 2023-02-05 DIAGNOSIS — Z7984 Long term (current) use of oral hypoglycemic drugs: Secondary | ICD-10-CM | POA: Diagnosis not present

## 2023-02-05 DIAGNOSIS — I10 Essential (primary) hypertension: Secondary | ICD-10-CM | POA: Insufficient documentation

## 2023-02-05 DIAGNOSIS — E1165 Type 2 diabetes mellitus with hyperglycemia: Secondary | ICD-10-CM | POA: Diagnosis not present

## 2023-02-05 DIAGNOSIS — Z794 Long term (current) use of insulin: Secondary | ICD-10-CM | POA: Insufficient documentation

## 2023-02-05 DIAGNOSIS — Z9049 Acquired absence of other specified parts of digestive tract: Secondary | ICD-10-CM | POA: Insufficient documentation

## 2023-02-05 DIAGNOSIS — G4730 Sleep apnea, unspecified: Secondary | ICD-10-CM | POA: Diagnosis not present

## 2023-02-05 DIAGNOSIS — Z79899 Other long term (current) drug therapy: Secondary | ICD-10-CM | POA: Insufficient documentation

## 2023-02-05 DIAGNOSIS — Z1211 Encounter for screening for malignant neoplasm of colon: Secondary | ICD-10-CM | POA: Diagnosis present

## 2023-02-05 DIAGNOSIS — Z860101 Personal history of adenomatous and serrated colon polyps: Secondary | ICD-10-CM | POA: Diagnosis not present

## 2023-02-05 DIAGNOSIS — Z8546 Personal history of malignant neoplasm of prostate: Secondary | ICD-10-CM | POA: Diagnosis not present

## 2023-02-05 HISTORY — PX: COLONOSCOPY WITH PROPOFOL: SHX5780

## 2023-02-05 HISTORY — DX: Malignant (primary) neoplasm, unspecified: C80.1

## 2023-02-05 LAB — GLUCOSE, CAPILLARY: Glucose-Capillary: 275 mg/dL — ABNORMAL HIGH (ref 70–99)

## 2023-02-05 SURGERY — COLONOSCOPY WITH PROPOFOL
Anesthesia: General

## 2023-02-05 MED ORDER — LIDOCAINE HCL (CARDIAC) PF 100 MG/5ML IV SOSY
PREFILLED_SYRINGE | INTRAVENOUS | Status: DC | PRN
  Administered 2023-02-05: 50 mg via INTRAVENOUS

## 2023-02-05 MED ORDER — SODIUM CHLORIDE 0.9 % IV SOLN: INTRAVENOUS | Status: DC

## 2023-02-05 MED ORDER — PROPOFOL 10 MG/ML IV BOLUS
INTRAVENOUS | Status: DC | PRN
  Administered 2023-02-05 (×5): 40 mg via INTRAVENOUS
  Administered 2023-02-05: 100 mg via INTRAVENOUS

## 2023-02-05 NOTE — Anesthesia Preprocedure Evaluation (Signed)
Anesthesia Evaluation  Patient identified by MRN, date of birth, ID band Patient awake    Reviewed: Allergy & Precautions, NPO status , Patient's Chart, lab work & pertinent test results  Airway Mallampati: II  TM Distance: >3 FB Neck ROM: full    Dental  (+) Teeth Intact   Pulmonary neg pulmonary ROS, asthma , sleep apnea and Continuous Positive Airway Pressure Ventilation    Pulmonary exam normal breath sounds clear to auscultation       Cardiovascular Exercise Tolerance: Good hypertension, Pt. on medications negative cardio ROS Normal cardiovascular exam Rhythm:Regular Rate:Normal     Neuro/Psych negative neurological ROS  negative psych ROS   GI/Hepatic negative GI ROS, Neg liver ROS,,,  Endo/Other  negative endocrine ROSdiabetes, Poorly Controlled, Type 1, Insulin Dependent    Renal/GU negative Renal ROS  negative genitourinary   Musculoskeletal   Abdominal Normal abdominal exam  (+)   Peds negative pediatric ROS (+)  Hematology negative hematology ROS (+)   Anesthesia Other Findings Past Medical History: No date: Asthma No date: Cancer (HCC)     Comment:  prostate No date: Diabetes mellitus without complication (HCC)     Comment:  type II No date: Hypertension No date: Sleep apnea     Comment:  uses Cpap machine   Past Surgical History: No date: CHOLECYSTECTOMY 11/18/2018: COLONOSCOPY WITH PROPOFOL; N/A     Comment:  Procedure: COLONOSCOPY WITH PROPOFOL;  Surgeon:               Pasty Spillers, MD;  Location: [facility] ENDOSCOPY;                Service: Gastroenterology;  Laterality: N/A; No date: LAPAROSCOPIC RETROPUBIC PROSTATECTOMY     Comment:  2023 No date: LUNG SURGERY     Comment:  "ligation of thoracic duct" 09/21/2020: PROSTATE BIOPSY; N/A     Comment:  Procedure: PROSTATE BIOPSY URONAV;  Surgeon: [name], MD;  Location: [facility] ORS;  Service: Urology;                 Laterality: N/A;  BMI    Body Mass Index: 21.67 kg/m      Reproductive/Obstetrics negative OB ROS                             Anesthesia Physical Anesthesia Plan  ASA: 2  Anesthesia Plan: General   Post-op Pain Management:    Induction: Intravenous  PONV Risk Score and Plan: Propofol infusion and TIVA  Airway Management Planned: Natural Airway and Nasal Cannula  Additional Equipment:   Intra-op Plan:   Post-operative Plan:   Informed Consent: I have reviewed the patients History and Physical, chart, labs and discussed the procedure including the risks, benefits and alternatives for the proposed anesthesia with the patient or authorized representative who has indicated his/her understanding and acceptance.     Dental Advisory Given  Plan Discussed with: CRNA and Surgeon  Anesthesia Plan Comments:        Anesthesia Quick Evaluation

## 2023-02-05 NOTE — Op Note (Signed)
[facility] Gastroenterology Patient Name: Tony Benson Procedure Date: 02/05/2023 8:36 AM MRN: [id number] Account #: [id number] Date of Birth: April 24, 1960 Admit Type: Outpatient Age: 62 Room: [facility] ENDO ROOM 1 Gender: Male Note Status: Finalized Instrument Name: Prentice Docker [id number] Procedure:             Colonoscopy Indications:           Screening in patient at increased risk: Family history                         of 1st-degree relative with colorectal cancer,                         Surveillance: Personal history of adenomatous polyps                         on last colonoscopy > 3 years ago, Last colonoscopy:                         August 2020 Providers:             Toney Reil MD, MD Referring MD:          Sherrie Mustache, MD (Referring MD) Medicines:             General Anesthesia Complications:         No immediate complications. Estimated blood loss: None. Procedure:             Pre-Anesthesia Assessment:                        - Prior to the procedure, a History and Physical was                         performed, and patient medications and allergies were                         reviewed. The patient is competent. The risks and                         benefits of the procedure and the sedation options and                         risks were discussed with the patient. All questions                         were answered and informed consent was obtained.                         Patient identification and proposed procedure were                         verified by the physician, the nurse, the                         anesthesiologist, the anesthetist and the technician                         in the pre-procedure area in the procedure room in the  endoscopy suite. Mental Status Examination: alert and                         oriented. Airway Examination: normal oropharyngeal                         airway and neck mobility.  Respiratory Examination:                         clear to auscultation. CV Examination: normal.                         Prophylactic Antibiotics: The patient does not require                         prophylactic antibiotics. Prior Anticoagulants: The                         patient has taken no anticoagulant or antiplatelet                         agents. ASA Grade Assessment: II - A patient with mild                         systemic disease. After reviewing the risks and                         benefits, the patient was deemed in satisfactory                         condition to undergo the procedure. The anesthesia                         plan was to use general anesthesia. Immediately prior                         to administration of medications, the patient was                         re-assessed for adequacy to receive sedatives. The                         heart rate, respiratory rate, oxygen saturations,                         blood pressure, adequacy of pulmonary ventilation, and                         response to care were monitored throughout the                         procedure. The physical status of the patient was                         re-assessed after the procedure.                        After obtaining informed consent, the colonoscope was  passed under direct vision. Throughout the procedure,                         the patient's blood pressure, pulse, and oxygen                         saturations were monitored continuously. The                         Colonoscope was introduced through the anus and                         advanced to the the cecum, identified by appendiceal                         orifice and ileocecal valve. The colonoscopy was                         performed without difficulty. The patient tolerated                         the procedure well. The quality of the bowel                         preparation was evaluated  using the BBPS [facility] Bowel                         Preparation Scale) with scores of: Right Colon = 3,                         Transverse Colon = 3 and Left Colon = 3 (entire mucosa                         seen well with no residual staining, small fragments                         of stool or opaque liquid). The total BBPS score                         equals 9. The ileocecal valve, appendiceal orifice,                         and rectum were photographed. Findings:      The perianal and digital rectal examinations were normal. Pertinent       negatives include normal sphincter tone and no palpable rectal lesions.      The entire examined colon appeared normal.      The retroflexed view of the distal rectum and anal verge was normal and       showed no anal or rectal abnormalities. Impression:            - The entire examined colon is normal.                        - The distal rectum and anal verge are normal on                         retroflexion view.                        -  No specimens collected. Recommendation:        - Discharge patient to home (with escort).                        - Resume previous diet today.                        - Continue present medications.                        - Repeat colonoscopy in 5 years for screening purposes. Procedure Code(s):     --- Professional ---                        Z6109, Colorectal cancer screening; colonoscopy on                         individual at high risk Diagnosis Code(s):     --- Professional ---                        Z80.0, Family history of malignant neoplasm of                         digestive organs                        Z86.010, Personal history of colonic polyps CPT copyright 2022 American Medical Association. All rights reserved. The codes documented in this report are preliminary and upon coder review may  be revised to meet current compliance requirements. Dr. Libby Maw Toney Reil MD,  MD 02/05/2023 9:27:15 AM This report has been signed electronically. Number of Addenda: 0 Note Initiated On: 02/05/2023 8:36 AM Scope Withdrawal Time: 0 hours 9 minutes 8 seconds  Total Procedure Duration: 0 hours 14 minutes 12 seconds  Estimated Blood Loss:  Estimated blood loss: none.      [facility]

## 2023-02-05 NOTE — H&P (Signed)
Tony Repress, MD [address]  [address]  [place], [place] [address]  Main: [phone]  Fax: [phone] Pager: [phone]  Primary Care Physician:  Tony Mustache, MD Primary Gastroenterologist:  Dr. Arlyss Benson  Pre-Procedure History & Physical: HPI:  Tony Benson is a 62 y.o. male is here for a colonoscopy.   Past Medical History:  Diagnosis Date   Asthma    Cancer (HCC)    prostate   Diabetes mellitus without complication (HCC)    type II   Hypertension    Sleep apnea    uses Cpap machine     Past Surgical History:  Procedure Laterality Date   CHOLECYSTECTOMY     COLONOSCOPY WITH PROPOFOL N/A 11/18/2018   Procedure: COLONOSCOPY WITH PROPOFOL;  Surgeon: Tony Spillers, MD;  Location: [facility] ENDOSCOPY;  Service: Gastroenterology;  Laterality: N/A;   LAPAROSCOPIC RETROPUBIC PROSTATECTOMY     2023   LUNG SURGERY     "ligation of thoracic duct"   PROSTATE BIOPSY N/A 09/21/2020   Procedure: PROSTATE BIOPSY Tony Benson;  Surgeon: Tony Ape, MD;  Location: [facility] ORS;  Service: Urology;  Laterality: N/A;    Prior to Admission medications   Medication Sig Start Date End Date Taking? Authorizing Provider  acetaminophen (TYLENOL) 500 MG tablet Take 1,000 mg by mouth every 8 (eight) hours as needed for moderate pain.    [provider]  amLODipine (NORVASC) 10 MG tablet Take 10 mg by mouth daily. 09/10/17   [provider]  atorvastatin (LIPITOR) 10 MG tablet Take 10 mg by mouth daily.    [provider]  celecoxib (CELEBREX) 200 MG capsule Take 200 mg by mouth 2 (two) times daily as needed.     [provider]  chlorthalidone (HYGROTON) 25 MG tablet Take 25 mg by mouth daily.    [provider]  diazepam (VALIUM) 5 MG tablet Take 5 mg by mouth every 12 (twelve) hours as needed for anxiety.    [provider]  gabapentin (NEURONTIN) 100 MG capsule Take by mouth. 11/16/21   [provider]   glipiZIDE (GLUCOTROL XL) 5 MG 24 hr tablet Take 5 mg by mouth 2 (two) times daily. 05/22/20   [provider]  JARDIANCE 25 MG TABS tablet Take 25 mg by mouth daily. 12/23/22   [provider]  levofloxacin (LEVAQUIN) 500 MG tablet Take 1 tablet (500 mg total) by mouth daily. 09/21/20   Tony Ape, MD  losartan (COZAAR) 100 MG tablet Take 100 mg by mouth daily. 08/15/17   [provider]  metaxalone (SKELAXIN) 800 MG tablet Take by mouth. 12/10/20   [provider]  metFORMIN (GLUCOPHAGE) 1000 MG tablet Take 1,000 mg by mouth 2 (two) times daily with a meal.  Patient not taking: Reported on 12/27/2022    [provider]  mometasone-formoterol (DULERA) 100-5 MCG/ACT AERO Inhale 2 puffs into the lungs 2 (two) times daily as needed for wheezing or shortness of breath.    [provider]  Omega-3 Fatty Acids (FISH OIL) 1000 MG CAPS Take 1,000 mg by mouth daily.    [provider]  oxybutynin (DITROPAN) 5 MG tablet Take by mouth. 11/16/21   [provider]  polyethylene glycol (MIRALAX / GLYCOLAX) 17 g packet Take by mouth. 11/16/21   [provider]  potassium chloride SA (KLOR-CON M20) 20 MEQ tablet Take by mouth. 09/15/20   [provider]  RYBELSUS 7 MG TABS PLEASE SEE ATTACHED  FOR DETAILED DIRECTIONS 12/21/22   [provider]  senna-docusate (SENOKOT-S) 8.6-50 MG tablet Take by mouth. 11/16/21   [provider]  sildenafil (VIAGRA) 50 MG tablet Take 50 mg by mouth every 3 (three) days.    [provider]  TOUJEO SOLOSTAR 300 UNIT/ML Solostar Pen SMARTSIG:25 Unit(s) SUB-Q Daily 12/02/22   [provider]  Vitamin D, Ergocalciferol, (DRISDOL) 50000 units CAPS capsule Take 50,000 Units by mouth every 7 (seven) days.    [provider]    Allergies as of 12/27/2022 - Review Complete 12/27/2022  Allergen Reaction Noted   Other Anaphylaxis 09/24/2017   Morphine and codeine  Itching 08/04/2013    Family History  Problem Relation Age of Onset   Allergies Sister     Social History   Socioeconomic History   Marital status: Married    Spouse name: Not on file   Number of children: Not on file   Years of education: Not on file   Highest education level: Not on file  Occupational History   Occupation: Automotive engineer   Tobacco Use   Smoking status: Never   Smokeless tobacco: Never  Vaping Use   Vaping status: Never Used  Substance and Sexual Activity   Alcohol use: No   Drug use: No   Sexual activity: Not on file  Other Topics Concern   Not on file  Social History Narrative   Not on file   Social Determinants of Health   Financial Resource Strain: Not on file  Food Insecurity: Not on file  Transportation Needs: Not on file  Physical Activity: Not on file  Stress: Not on file  Social Connections: Not on file  Intimate Partner Violence: Not on file    Review of Systems: See HPI, otherwise negative ROS  Physical Exam: BP 137/85   Pulse (!) 111   Resp 18   Ht 5\' 10"  (1.778 m)   Wt 68.5 kg   BMI 21.67 kg/m  General:   Alert,  pleasant and cooperative in NAD Head:  Normocephalic and atraumatic. Neck:  Supple; no masses or thyromegaly. Lungs:  Clear throughout to auscultation.    Heart:  Regular rate and rhythm. Abdomen:  Soft, nontender and nondistended. Normal bowel sounds, without guarding, and without rebound.   Neurologic:  Alert and  oriented x4;  grossly normal neurologically.  Impression/Plan: Tony Benson is here for an colonoscopy to be performed for h/o colon adenomas  Risks, benefits, limitations, and alternatives regarding  colonoscopy have been reviewed with the patient.  Questions have been answered.  All parties agreeable.   Tony Donath, MD  02/05/2023, 8:59 AM

## 2023-02-05 NOTE — Transfer of Care (Signed)
Immediate Anesthesia Transfer of Care Note  Patient: Tony Benson  Procedure(s) Performed: COLONOSCOPY WITH PROPOFOL  Patient Location: Endoscopy Unit  Anesthesia Type:General  Level of Consciousness: awake, alert , and oriented  Airway & Oxygen Therapy: Patient Spontanous Breathing and Patient connected to nasal cannula oxygen  Post-op Assessment: Report given to RN, Post -op Vital signs reviewed and stable, and Patient moving all extremities  Post vital signs: Reviewed and stable  Last Vitals:  Vitals Value Taken Time  BP    Temp    Pulse    Resp    SpO2      Last Pain:  Vitals:   02/05/23 0924  PainSc: 0-No pain         Complications: No notable events documented.

## 2023-02-05 NOTE — Anesthesia Postprocedure Evaluation (Signed)
Anesthesia Post Note  Patient: Tony Benson  Procedure(s) Performed: COLONOSCOPY WITH PROPOFOL  Patient location during evaluation: PACU Anesthesia Type: General Level of consciousness: awake and awake and alert Pain management: pain level controlled Vital Signs Assessment: post-procedure vital signs reviewed and stable Respiratory status: spontaneous breathing Cardiovascular status: stable Anesthetic complications: no   No notable events documented.   Last Vitals:  Vitals:   02/05/23 0934 02/05/23 0944  BP: (!) 153/93 (!) 133/100  Pulse: 89 93  Resp: 18 20  SpO2: 99% 99%    Last Pain:  Vitals:   02/05/23 0944  PainSc: 0-No pain                 VAN STAVEREN,Camille Thau

## 2023-02-06 ENCOUNTER — Encounter: Payer: Self-pay | Admitting: Gastroenterology

## 2023-02-18 ENCOUNTER — Other Ambulatory Visit: Payer: Self-pay | Admitting: Internal Medicine

## 2023-02-18 ENCOUNTER — Telehealth: Payer: Self-pay

## 2023-02-18 DIAGNOSIS — R634 Abnormal weight loss: Secondary | ICD-10-CM

## 2023-02-18 NOTE — Telephone Encounter (Signed)
Tony Benson from Dr. Dario Guardian office is calling because she states Dr. Dario Guardian would like you to give him a call about this patient. She states he recent had a appointment with him and had some weight loss and he wants to talk to you about it.   Call him at [phone]

## 2023-02-18 NOTE — Telephone Encounter (Signed)
Called Dr. Albertine Patricia office and he is with patient currently and I gave my cell phone number to Crystal, receptionist to call me back  RV

## 2023-02-19 ENCOUNTER — Encounter: Payer: Self-pay | Admitting: Surgery

## 2023-02-20 ENCOUNTER — Encounter: Payer: Self-pay | Admitting: Internal Medicine

## 2023-02-21 ENCOUNTER — Other Ambulatory Visit: Payer: Self-pay | Admitting: Surgery

## 2023-02-21 DIAGNOSIS — M542 Cervicalgia: Secondary | ICD-10-CM

## 2023-02-21 DIAGNOSIS — M7581 Other shoulder lesions, right shoulder: Secondary | ICD-10-CM

## 2023-02-21 DIAGNOSIS — S161XXA Strain of muscle, fascia and tendon at neck level, initial encounter: Secondary | ICD-10-CM

## 2023-02-24 ENCOUNTER — Encounter: Payer: BC Managed Care – PPO | Admitting: Oncology

## 2023-02-24 ENCOUNTER — Inpatient Hospital Stay: Payer: BC Managed Care – PPO

## 2023-02-24 ENCOUNTER — Encounter: Payer: Self-pay | Admitting: Surgery

## 2023-02-24 ENCOUNTER — Inpatient Hospital Stay: Payer: BC Managed Care – PPO | Attending: Internal Medicine | Admitting: Internal Medicine

## 2023-02-24 ENCOUNTER — Encounter: Payer: Self-pay | Admitting: Internal Medicine

## 2023-02-24 VITALS — BP 123/83 | HR 117 | Temp 97.5°F | Resp 17 | Ht 69.0 in | Wt 150.6 lb

## 2023-02-24 DIAGNOSIS — D72829 Elevated white blood cell count, unspecified: Secondary | ICD-10-CM | POA: Diagnosis not present

## 2023-02-24 DIAGNOSIS — Z8546 Personal history of malignant neoplasm of prostate: Secondary | ICD-10-CM | POA: Diagnosis not present

## 2023-02-24 DIAGNOSIS — Z7901 Long term (current) use of anticoagulants: Secondary | ICD-10-CM | POA: Insufficient documentation

## 2023-02-24 DIAGNOSIS — M542 Cervicalgia: Secondary | ICD-10-CM | POA: Diagnosis not present

## 2023-02-24 DIAGNOSIS — R970 Elevated carcinoembryonic antigen [CEA]: Secondary | ICD-10-CM | POA: Insufficient documentation

## 2023-02-24 DIAGNOSIS — R7989 Other specified abnormal findings of blood chemistry: Secondary | ICD-10-CM | POA: Insufficient documentation

## 2023-02-24 DIAGNOSIS — I82411 Acute embolism and thrombosis of right femoral vein: Secondary | ICD-10-CM | POA: Insufficient documentation

## 2023-02-24 DIAGNOSIS — I3139 Other pericardial effusion (noninflammatory): Secondary | ICD-10-CM | POA: Diagnosis not present

## 2023-02-24 DIAGNOSIS — I2699 Other pulmonary embolism without acute cor pulmonale: Secondary | ICD-10-CM | POA: Insufficient documentation

## 2023-02-24 DIAGNOSIS — I82432 Acute embolism and thrombosis of left popliteal vein: Secondary | ICD-10-CM | POA: Diagnosis not present

## 2023-02-24 DIAGNOSIS — R0600 Dyspnea, unspecified: Secondary | ICD-10-CM

## 2023-02-24 DIAGNOSIS — C250 Malignant neoplasm of head of pancreas: Secondary | ICD-10-CM | POA: Diagnosis not present

## 2023-02-24 DIAGNOSIS — R634 Abnormal weight loss: Secondary | ICD-10-CM | POA: Diagnosis not present

## 2023-02-24 DIAGNOSIS — R2243 Localized swelling, mass and lump, lower limb, bilateral: Secondary | ICD-10-CM | POA: Insufficient documentation

## 2023-02-24 DIAGNOSIS — M25511 Pain in right shoulder: Secondary | ICD-10-CM | POA: Diagnosis not present

## 2023-02-24 DIAGNOSIS — C787 Secondary malignant neoplasm of liver and intrahepatic bile duct: Secondary | ICD-10-CM | POA: Diagnosis not present

## 2023-02-24 DIAGNOSIS — Z9079 Acquired absence of other genital organ(s): Secondary | ICD-10-CM | POA: Diagnosis not present

## 2023-02-24 DIAGNOSIS — R609 Edema, unspecified: Secondary | ICD-10-CM | POA: Diagnosis not present

## 2023-02-24 DIAGNOSIS — Z5111 Encounter for antineoplastic chemotherapy: Secondary | ICD-10-CM | POA: Diagnosis present

## 2023-02-24 DIAGNOSIS — R109 Unspecified abdominal pain: Secondary | ICD-10-CM | POA: Insufficient documentation

## 2023-02-24 DIAGNOSIS — J90 Pleural effusion, not elsewhere classified: Secondary | ICD-10-CM | POA: Insufficient documentation

## 2023-02-24 DIAGNOSIS — R630 Anorexia: Secondary | ICD-10-CM | POA: Diagnosis not present

## 2023-02-24 DIAGNOSIS — E119 Type 2 diabetes mellitus without complications: Secondary | ICD-10-CM | POA: Diagnosis not present

## 2023-02-24 LAB — COMPREHENSIVE METABOLIC PANEL
ALT: 17 U/L (ref 0–44)
AST: 18 U/L (ref 15–41)
Albumin: 2.7 g/dL — ABNORMAL LOW (ref 3.5–5.0)
Alkaline Phosphatase: 277 U/L — ABNORMAL HIGH (ref 38–126)
Anion gap: 12 (ref 5–15)
BUN: 15 mg/dL (ref 8–23)
CO2: 24 mmol/L (ref 22–32)
Calcium: 10 mg/dL (ref 8.9–10.3)
Chloride: 100 mmol/L (ref 98–111)
Creatinine, Ser: 0.76 mg/dL (ref 0.61–1.24)
GFR, Estimated: >60 mL/min (ref >60)
Glucose, Bld: 207 mg/dL — ABNORMAL HIGH (ref 70–99)
Potassium: 4.9 mmol/L (ref 3.5–5.1)
Sodium: 136 mmol/L (ref 135–145)
Total Bilirubin: 0.9 mg/dL (ref <1.2)
Total Protein: 7.1 g/dL (ref 6.5–8.1)

## 2023-02-24 LAB — CBC WITH DIFFERENTIAL/PLATELET
Abs Immature Granulocytes: 0.27 10*3/uL — ABNORMAL HIGH (ref 0.00–0.07)
Basophils Absolute: 0.1 10*3/uL (ref 0.0–0.1)
Basophils Relative: 1 %
Eosinophils Absolute: 0.3 10*3/uL (ref 0.0–0.5)
Eosinophils Relative: 2 %
HCT: 37.2 % — ABNORMAL LOW (ref 39.0–52.0)
Hemoglobin: 11.4 g/dL — ABNORMAL LOW (ref 13.0–17.0)
Immature Granulocytes: 1 %
Lymphocytes Relative: 1 %
Lymphs Abs: 0.3 10*3/uL — ABNORMAL LOW (ref 0.7–4.0)
MCH: 27.2 pg (ref 26.0–34.0)
MCHC: 30.6 g/dL (ref 30.0–36.0)
MCV: 88.8 fL (ref 80.0–100.0)
Monocytes Absolute: 1.6 10*3/uL — ABNORMAL HIGH (ref 0.1–1.0)
Monocytes Relative: 8 %
Neutro Abs: 16.5 10*3/uL — ABNORMAL HIGH (ref 1.7–7.7)
Neutrophils Relative %: 87 %
Platelets: 366 10*3/uL (ref 150–400)
RBC: 4.19 MIL/uL — ABNORMAL LOW (ref 4.22–5.81)
RDW: 13.3 % (ref 11.5–15.5)
WBC: 19 10*3/uL — ABNORMAL HIGH (ref 4.0–10.5)
nRBC: 0 % (ref 0.0–0.2)

## 2023-02-24 LAB — BRAIN NATRIURETIC PEPTIDE: B Natriuretic Peptide: 35.9 pg/mL (ref 0.0–100.0)

## 2023-02-24 LAB — VITAMIN B12: Vitamin B-12: 1485 pg/mL — ABNORMAL HIGH (ref 180–914)

## 2023-02-24 LAB — LACTATE DEHYDROGENASE: LDH: 201 U/L — ABNORMAL HIGH (ref 98–192)

## 2023-02-24 NOTE — Progress Notes (Signed)
[facility] CONSULT NOTE  Patient Care Team: Sherrie Mustache, MD as PCP - General (Internal Medicine) Luretha Murphy, MD as Consulting Physician (General Surgery) Earna Coder, MD as Consulting Physician (Oncology)  CHIEF COMPLAINTS/PURPOSE OF CONSULTATION:  abnormal weight loss/ b12 elavated. Marland Kitchen   HISTORY OF PRESENTING ILLNESS: Patient ambulating-independently. Accompanied by wife.   Tony Benson 62 y.o.  male pleasant patient with a history of poorly controlled diabetes-and history of prostate cancer status post prostatectomy 2022 has been referred to [place] for elevated B12 and also profound weight loss.  States that he has lost significant weight.  He has lost 80 lb in a year. He initially started out trying to lose weight but not that much.  Appetite is low, forces himself to eat.  Of note patient has been on GLP-1 agonist approximately a year ago.  Pain in neck and shoulder. Abd pain is intermittent. No nausea. Bowels normal.   Swelling in ankles started 1 week ago.  Of note patient had a chest x-ray-recently through PCP that showed right-sided pleural effusion.  Review of Systems  Constitutional:  Positive for malaise/fatigue and weight loss. Negative for chills, diaphoresis and fever.  HENT:  Negative for nosebleeds and sore throat.   Eyes:  Negative for double vision.  Respiratory:  Positive for cough and shortness of breath. Negative for hemoptysis, sputum production and wheezing.   Cardiovascular:  Negative for chest pain, palpitations, orthopnea and leg swelling.  Gastrointestinal:  Negative for abdominal pain, blood in stool, constipation, diarrhea, heartburn, melena, nausea and vomiting.  Genitourinary:  Negative for dysuria, frequency and urgency.  Musculoskeletal:  Positive for back pain and joint pain.  Skin: Negative.  Negative for itching and rash.  Neurological:  Negative for dizziness, tingling, focal weakness, weakness and headaches.   Endo/Heme/Allergies:  Does not bruise/bleed easily.  Psychiatric/Behavioral:  Negative for depression. The patient is not nervous/anxious and does not have insomnia.     MEDICAL HISTORY:  Past Medical History:  Diagnosis Date   Asthma    Cancer (HCC)    prostate   Diabetes mellitus without complication (HCC)    type II   Hypertension    Sleep apnea    uses Cpap machine     SURGICAL HISTORY: Past Surgical History:  Procedure Laterality Date   CHOLECYSTECTOMY     COLONOSCOPY WITH PROPOFOL N/A 11/18/2018   Procedure: COLONOSCOPY WITH PROPOFOL;  Surgeon: Pasty Spillers, MD;  Location: [facility] ENDOSCOPY;  Service: Gastroenterology;  Laterality: N/A;   COLONOSCOPY WITH PROPOFOL N/A 02/05/2023   Procedure: COLONOSCOPY WITH PROPOFOL;  Surgeon: Toney Reil, MD;  Location: [facility] ENDOSCOPY;  Service: Gastroenterology;  Laterality: N/A;   LAPAROSCOPIC RETROPUBIC PROSTATECTOMY     2023   LUNG SURGERY     "ligation of thoracic duct"   PROSTATE BIOPSY N/A 09/21/2020   Procedure: PROSTATE BIOPSY [name];  Surgeon: [name], MD;  Location: [facility] ORS;  Service: Urology;  Laterality: N/A;    SOCIAL HISTORY: Social History   Socioeconomic History   Marital status: Married    Spouse name: Not on file   Number of children: Not on file   Years of education: Not on file   Highest education level: Not on file  Occupational History   Occupation: Automotive engineer   Tobacco Use   Smoking status: Never   Smokeless tobacco: Never  Vaping Use   Vaping status: Never Used  Substance and Sexual Activity   Alcohol use: No  Drug use: No   Sexual activity: Not on file  Other Topics Concern   Not on file  Social History Narrative   Not on file   Social Determinants of Health   Financial Resource Strain: Not on file  Food Insecurity: No Food Insecurity (02/24/2023)   Hunger Vital Sign    Worried About Running Out of Food in the Last Year: Never true    Ran Out of Food in the  Last Year: Never true  Transportation Needs: Not on file  Physical Activity: Not on file  Stress: Not on file  Social Connections: Not on file  Intimate Partner Violence: Not At Risk (02/24/2023)   Humiliation, Afraid, Rape, and Kick questionnaire    Fear of Current or Ex-Partner: No    Emotionally Abused: No    Physically Abused: No    Sexually Abused: No    FAMILY HISTORY: Family History  Problem Relation Age of Onset   Allergies Sister     ALLERGIES:  is allergic to other and morphine and codeine.  MEDICATIONS:  Current Outpatient Medications  Medication Sig Dispense Refill   amLODipine (NORVASC) 10 MG tablet Take 10 mg by mouth daily.  1   atorvastatin (LIPITOR) 10 MG tablet Take 10 mg by mouth daily.     JARDIANCE 25 MG TABS tablet Take 25 mg by mouth daily.     losartan (COZAAR) 100 MG tablet Take 100 mg by mouth daily.  1   metFORMIN (GLUCOPHAGE) 1000 MG tablet Take 1,000 mg by mouth 2 (two) times daily with a meal.     mometasone-formoterol (DULERA) 100-5 MCG/ACT AERO Inhale 2 puffs into the lungs 2 (two) times daily as needed for wheezing or shortness of breath.     Omega-3 Fatty Acids (FISH OIL) 1000 MG CAPS Take 1,000 mg by mouth daily.     RYBELSUS 7 MG TABS PLEASE SEE ATTACHED FOR DETAILED DIRECTIONS     senna-docusate (SENOKOT-S) 8.6-50 MG tablet Take by mouth.     sildenafil (VIAGRA) 50 MG tablet Take 50 mg by mouth every 3 (three) days.     TOUJEO SOLOSTAR 300 UNIT/ML Solostar Pen SMARTSIG:25 Unit(s) SUB-Q Daily     Vitamin D, Ergocalciferol, (DRISDOL) 50000 units CAPS capsule Take 50,000 Units by mouth every 7 (seven) days.     potassium chloride SA (KLOR-CON M20) 20 MEQ tablet Take by mouth. (Patient not taking: Reported on 02/24/2023)     No current facility-administered medications for this visit.    PHYSICAL EXAMINATION:   Vitals:   02/24/23 1356  BP: 123/83  Pulse: (!) 117  Resp: 17  Temp: (!) 97.5 F (36.4 C)  SpO2: 99%   Filed Weights    02/24/23 1356  Weight: 150 lb 9.6 oz (68.3 kg)    Physical Exam Vitals and nursing note reviewed.  HENT:     Head: Normocephalic and atraumatic.     Mouth/Throat:     Pharynx: Oropharynx is clear.  Eyes:     Extraocular Movements: Extraocular movements intact.     Pupils: Pupils are equal, round, and reactive to light.  Cardiovascular:     Rate and Rhythm: Normal rate and regular rhythm.  Pulmonary:     Comments: Decreased breath sounds bilaterally.  Abdominal:     Palpations: Abdomen is soft.  Musculoskeletal:        General: Normal range of motion.     Cervical back: Normal range of motion.  Skin:    General: Skin is warm.  Neurological:     General: No focal deficit present.     Mental Status: He is alert and oriented to person, place, and time.  Psychiatric:        Behavior: Behavior normal.        Judgment: Judgment normal.     LABORATORY DATA:  I have reviewed the data as listed Lab Results  Component Value Date   WBC 19.0 (H) 02/24/2023   HGB 11.4 (L) 02/24/2023   HCT 37.2 (L) 02/24/2023   MCV 88.8 02/24/2023   PLT 366 02/24/2023   Recent Labs    02/24/23 1447  NA 136  K 4.9  CL 100  CO2 24  GLUCOSE 207*  BUN 15  CREATININE 0.76  CALCIUM 10.0  GFRNONAA >60  PROT 7.1  ALBUMIN 2.7*  AST 18  ALT 17  ALKPHOS 277*  BILITOT 0.9    RADIOGRAPHIC STUDIES: I have personally reviewed the radiological images as listed and agreed with the findings in the report. No results found.   Weight loss, abnormal # Weight loss/ ? GLP-1 agonist vs- maligancy-  Right pleural effusion- [CXR nov 6th-PCP]- concerning for malignancy.  Recommended CT scan chest abdomen pelvis.  If significant findings noted on imaging consider thoracentesis and also biopsy.   # Elevated B12: off MV now; will repeat B12 levels.   # Poorly controlled Diabetes: Rybelsus [1 year]-MAY 2024 Hb A1C- 14 [130-170]- Jardiance; on metformin.    # Prostate cancer [2022]: s/p surgery at  [facility]-s/p No adjuvant; no RT- PSA- low as per patient.  Clinically less likely because of patient's above symptoms.  # Right LE swelling > Left- intermittently- US dopplers ASAP.;  Also check BNP to rule out any heart failure.  # Neck pain/right shoulder pain awaiting MRI with orthopedics Dr. Joice Lofts- await work up.   Thank you Dr.Jadali for allowing me to participate in the care of your pleasant patient. Please do not hesitate to contact me with questions or concerns in the interim.  # DISPOSITION: # labs- today # CT CAP ASAP # Bil LE dopplers ASAP # follow up in 1 week- MD: no labs- Dr.B   Above plan of care was discussed with patient/family in detail.  My contact information was given to the patient/family.     Earna Coder, MD 02/24/2023 4:32 PM

## 2023-02-24 NOTE — Progress Notes (Signed)
Patient states he is here because of weight loss. He has lost 80 lb in a year. He initially started out trying to lose weight but not that much.  Appetite is low, forces himself to eat. Pain in neck and shoulder. Abd pain is intermittent. No nausea. Bowels normal. Swelling in ankles started 1 week ago. PCP aware and watching.

## 2023-02-24 NOTE — Assessment & Plan Note (Addendum)
#   Weight loss/ ? GLP-1 agonist vs- maligancy-  Right pleural effusion- [CXR nov 6th-PCP]- concerning for malignancy.  Recommended CT scan chest abdomen pelvis.  If significant findings noted on imaging consider thoracentesis and also biopsy.   # Elevated B12: off MV now; will repeat B12 levels.   # Poorly controlled Diabetes: Rybelsus [1 year]-MAY 2024 Hb A1C- 14 [130-170]- Jardiance; on metformin.    # Prostate cancer [2022]: s/p surgery at [facility]-s/p No adjuvant; no RT- PSA- low as per patient.  Clinically less likely because of patient's above symptoms.  # Right LE swelling > Left- intermittently- US dopplers ASAP.;  Also check BNP to rule out any heart failure.  # Neck pain/right shoulder pain awaiting MRI with orthopedics Dr. Joice Lofts- await work up.   Thank you Dr.Jadali for allowing me to participate in the care of your pleasant patient. Please do not hesitate to contact me with questions or concerns in the interim.  # DISPOSITION: # labs- today # CT CAP ASAP # Bil LE dopplers ASAP # follow up in 1 week- MD: no labs- Dr.B

## 2023-02-25 ENCOUNTER — Other Ambulatory Visit: Payer: BC Managed Care – PPO

## 2023-02-25 ENCOUNTER — Ambulatory Visit (INDEPENDENT_AMBULATORY_CARE_PROVIDER_SITE_OTHER): Payer: BC Managed Care – PPO

## 2023-02-25 DIAGNOSIS — M8589 Other specified disorders of bone density and structure, multiple sites: Secondary | ICD-10-CM | POA: Diagnosis not present

## 2023-02-25 DIAGNOSIS — R634 Abnormal weight loss: Secondary | ICD-10-CM

## 2023-02-26 ENCOUNTER — Other Ambulatory Visit: Payer: Self-pay | Admitting: Internal Medicine

## 2023-02-26 ENCOUNTER — Ambulatory Visit
Admission: RE | Admit: 2023-02-26 | Discharge: 2023-02-26 | Disposition: A | Discharge status: Home / Self Care | Payer: BC Managed Care – PPO | Source: Ambulatory Visit | Attending: Internal Medicine | Admitting: Internal Medicine

## 2023-02-26 DIAGNOSIS — R0602 Shortness of breath: Secondary | ICD-10-CM | POA: Diagnosis not present

## 2023-02-26 DIAGNOSIS — R634 Abnormal weight loss: Secondary | ICD-10-CM | POA: Insufficient documentation

## 2023-02-26 DIAGNOSIS — R2243 Localized swelling, mass and lump, lower limb, bilateral: Secondary | ICD-10-CM | POA: Insufficient documentation

## 2023-02-26 DIAGNOSIS — A419 Sepsis, unspecified organism: Secondary | ICD-10-CM | POA: Diagnosis not present

## 2023-02-26 MED ORDER — APIXABAN (ELIQUIS) VTE STARTER PACK (10MG AND 5MG): ORAL_TABLET | ORAL | 0 refills | Status: DC

## 2023-02-26 NOTE — Progress Notes (Signed)
Pt not reachable. Spoke to pt's wife re: bil LE DVTs. As per wife- pt not having and symptoms of PE.  If any PE symtoms- pt will need to go to ER.   recommend eliquis. Sent the script.   Follow up as planned.   FYI-

## 2023-02-27 ENCOUNTER — Encounter: Payer: Self-pay | Admitting: Emergency Medicine

## 2023-02-27 ENCOUNTER — Emergency Department: Payer: BC Managed Care – PPO

## 2023-02-27 ENCOUNTER — Other Ambulatory Visit: Payer: Self-pay

## 2023-02-27 DIAGNOSIS — I10 Essential (primary) hypertension: Secondary | ICD-10-CM | POA: Diagnosis present

## 2023-02-27 DIAGNOSIS — E1165 Type 2 diabetes mellitus with hyperglycemia: Secondary | ICD-10-CM | POA: Diagnosis not present

## 2023-02-27 DIAGNOSIS — Z800 Family history of malignant neoplasm of digestive organs: Secondary | ICD-10-CM

## 2023-02-27 DIAGNOSIS — I82433 Acute embolism and thrombosis of popliteal vein, bilateral: Secondary | ICD-10-CM | POA: Diagnosis present

## 2023-02-27 DIAGNOSIS — E871 Hypo-osmolality and hyponatremia: Secondary | ICD-10-CM | POA: Diagnosis present

## 2023-02-27 DIAGNOSIS — C250 Malignant neoplasm of head of pancreas: Secondary | ICD-10-CM | POA: Diagnosis present

## 2023-02-27 DIAGNOSIS — Z7984 Long term (current) use of oral hypoglycemic drugs: Secondary | ICD-10-CM

## 2023-02-27 DIAGNOSIS — I3139 Other pericardial effusion (noninflammatory): Secondary | ICD-10-CM | POA: Diagnosis present

## 2023-02-27 DIAGNOSIS — E860 Dehydration: Secondary | ICD-10-CM | POA: Diagnosis present

## 2023-02-27 DIAGNOSIS — D638 Anemia in other chronic diseases classified elsewhere: Secondary | ICD-10-CM | POA: Diagnosis present

## 2023-02-27 DIAGNOSIS — J45909 Unspecified asthma, uncomplicated: Secondary | ICD-10-CM | POA: Diagnosis present

## 2023-02-27 DIAGNOSIS — Z794 Long term (current) use of insulin: Secondary | ICD-10-CM

## 2023-02-27 DIAGNOSIS — A419 Sepsis, unspecified organism: Principal | ICD-10-CM | POA: Diagnosis present

## 2023-02-27 DIAGNOSIS — Z86718 Personal history of other venous thrombosis and embolism: Secondary | ICD-10-CM

## 2023-02-27 DIAGNOSIS — E785 Hyperlipidemia, unspecified: Secondary | ICD-10-CM | POA: Diagnosis present

## 2023-02-27 DIAGNOSIS — I82413 Acute embolism and thrombosis of femoral vein, bilateral: Secondary | ICD-10-CM | POA: Diagnosis present

## 2023-02-27 DIAGNOSIS — I824Z3 Acute embolism and thrombosis of unspecified deep veins of distal lower extremity, bilateral: Secondary | ICD-10-CM | POA: Diagnosis present

## 2023-02-27 DIAGNOSIS — C787 Secondary malignant neoplasm of liver and intrahepatic bile duct: Secondary | ICD-10-CM | POA: Diagnosis present

## 2023-02-27 DIAGNOSIS — Z885 Allergy status to narcotic agent status: Secondary | ICD-10-CM

## 2023-02-27 DIAGNOSIS — Z6821 Body mass index (BMI) 21.0-21.9, adult: Secondary | ICD-10-CM

## 2023-02-27 DIAGNOSIS — Z7901 Long term (current) use of anticoagulants: Secondary | ICD-10-CM

## 2023-02-27 DIAGNOSIS — Z8546 Personal history of malignant neoplasm of prostate: Secondary | ICD-10-CM

## 2023-02-27 DIAGNOSIS — J189 Pneumonia, unspecified organism: Secondary | ICD-10-CM | POA: Diagnosis present

## 2023-02-27 DIAGNOSIS — F419 Anxiety disorder, unspecified: Secondary | ICD-10-CM | POA: Diagnosis present

## 2023-02-27 DIAGNOSIS — Z9049 Acquired absence of other specified parts of digestive tract: Secondary | ICD-10-CM

## 2023-02-27 DIAGNOSIS — D630 Anemia in neoplastic disease: Secondary | ICD-10-CM | POA: Diagnosis present

## 2023-02-27 DIAGNOSIS — Z79899 Other long term (current) drug therapy: Secondary | ICD-10-CM

## 2023-02-27 DIAGNOSIS — G4733 Obstructive sleep apnea (adult) (pediatric): Secondary | ICD-10-CM | POA: Diagnosis present

## 2023-02-27 DIAGNOSIS — E43 Unspecified severe protein-calorie malnutrition: Secondary | ICD-10-CM | POA: Diagnosis present

## 2023-02-27 DIAGNOSIS — Z7951 Long term (current) use of inhaled steroids: Secondary | ICD-10-CM

## 2023-02-27 DIAGNOSIS — Z9079 Acquired absence of other genital organ(s): Secondary | ICD-10-CM

## 2023-02-27 DIAGNOSIS — G894 Chronic pain syndrome: Secondary | ICD-10-CM | POA: Diagnosis present

## 2023-02-27 DIAGNOSIS — Z888 Allergy status to other drugs, medicaments and biological substances status: Secondary | ICD-10-CM

## 2023-02-27 DIAGNOSIS — R160 Hepatomegaly, not elsewhere classified: Secondary | ICD-10-CM | POA: Diagnosis present

## 2023-02-27 LAB — CBC
HCT: 34.9 % — ABNORMAL LOW (ref 39.0–52.0)
Hemoglobin: 11.2 g/dL — ABNORMAL LOW (ref 13.0–17.0)
MCH: 27.5 pg (ref 26.0–34.0)
MCHC: 32.1 g/dL (ref 30.0–36.0)
MCV: 85.7 fL (ref 80.0–100.0)
Platelets: 369 10*3/uL (ref 150–400)
RBC: 4.07 MIL/uL — ABNORMAL LOW (ref 4.22–5.81)
RDW: 13.4 % (ref 11.5–15.5)
WBC: 17.7 10*3/uL — ABNORMAL HIGH (ref 4.0–10.5)
nRBC: 0 % (ref 0.0–0.2)

## 2023-02-27 LAB — BASIC METABOLIC PANEL
Anion gap: 8 (ref 5–15)
BUN: 17 mg/dL (ref 8–23)
CO2: 23 mmol/L (ref 22–32)
Calcium: 9.5 mg/dL (ref 8.9–10.3)
Chloride: 101 mmol/L (ref 98–111)
Creatinine, Ser: 0.78 mg/dL (ref 0.61–1.24)
GFR, Estimated: >60 mL/min (ref >60)
Glucose, Bld: 301 mg/dL — ABNORMAL HIGH (ref 70–99)
Potassium: 4.2 mmol/L (ref 3.5–5.1)
Sodium: 132 mmol/L — ABNORMAL LOW (ref 135–145)

## 2023-02-27 LAB — TROPONIN I (HIGH SENSITIVITY): Troponin I (High Sensitivity): 9 ng/L (ref <18)

## 2023-02-27 MED ORDER — IOHEXOL 350 MG/ML SOLN
75.0000 mL | Freq: Once | INTRAVENOUS | Status: AC | PRN
  Administered 2023-02-27: 75 mL via INTRAVENOUS

## 2023-02-27 NOTE — ED Triage Notes (Signed)
Patient ambulatory to triage with complaints shortness of breath approx 1 hour PTA. Patient states he was recently diagnosed with blood clots in his legs and placed on eliquis yesterday.

## 2023-02-28 ENCOUNTER — Encounter: Payer: Self-pay | Admitting: Family Medicine

## 2023-02-28 ENCOUNTER — Inpatient Hospital Stay: Payer: BC Managed Care – PPO

## 2023-02-28 ENCOUNTER — Inpatient Hospital Stay
Admission: EM | Admit: 2023-02-28 | Discharge: 2023-03-07 | DRG: 871 | Disposition: A | Discharge status: Home / Self Care | Payer: BC Managed Care – PPO | Attending: Internal Medicine | Admitting: Internal Medicine

## 2023-02-28 DIAGNOSIS — E785 Hyperlipidemia, unspecified: Secondary | ICD-10-CM | POA: Diagnosis present

## 2023-02-28 DIAGNOSIS — J45909 Unspecified asthma, uncomplicated: Secondary | ICD-10-CM | POA: Diagnosis present

## 2023-02-28 DIAGNOSIS — J189 Pneumonia, unspecified organism: Secondary | ICD-10-CM | POA: Diagnosis present

## 2023-02-28 DIAGNOSIS — R0602 Shortness of breath: Principal | ICD-10-CM

## 2023-02-28 DIAGNOSIS — E43 Unspecified severe protein-calorie malnutrition: Secondary | ICD-10-CM | POA: Insufficient documentation

## 2023-02-28 DIAGNOSIS — E1165 Type 2 diabetes mellitus with hyperglycemia: Secondary | ICD-10-CM | POA: Diagnosis not present

## 2023-02-28 DIAGNOSIS — Z6821 Body mass index (BMI) 21.0-21.9, adult: Secondary | ICD-10-CM | POA: Diagnosis not present

## 2023-02-28 DIAGNOSIS — K8689 Other specified diseases of pancreas: Secondary | ICD-10-CM

## 2023-02-28 DIAGNOSIS — J45901 Unspecified asthma with (acute) exacerbation: Secondary | ICD-10-CM | POA: Diagnosis not present

## 2023-02-28 DIAGNOSIS — I10 Essential (primary) hypertension: Secondary | ICD-10-CM

## 2023-02-28 DIAGNOSIS — I82413 Acute embolism and thrombosis of femoral vein, bilateral: Secondary | ICD-10-CM | POA: Diagnosis present

## 2023-02-28 DIAGNOSIS — R160 Hepatomegaly, not elsewhere classified: Secondary | ICD-10-CM | POA: Diagnosis present

## 2023-02-28 DIAGNOSIS — G4733 Obstructive sleep apnea (adult) (pediatric): Secondary | ICD-10-CM | POA: Diagnosis present

## 2023-02-28 DIAGNOSIS — C787 Secondary malignant neoplasm of liver and intrahepatic bile duct: Secondary | ICD-10-CM | POA: Diagnosis present

## 2023-02-28 DIAGNOSIS — D649 Anemia, unspecified: Secondary | ICD-10-CM | POA: Insufficient documentation

## 2023-02-28 DIAGNOSIS — I2699 Other pulmonary embolism without acute cor pulmonale: Secondary | ICD-10-CM | POA: Diagnosis present

## 2023-02-28 DIAGNOSIS — F419 Anxiety disorder, unspecified: Secondary | ICD-10-CM | POA: Insufficient documentation

## 2023-02-28 DIAGNOSIS — Z86718 Personal history of other venous thrombosis and embolism: Secondary | ICD-10-CM | POA: Diagnosis not present

## 2023-02-28 DIAGNOSIS — R000 Tachycardia, unspecified: Secondary | ICD-10-CM | POA: Diagnosis not present

## 2023-02-28 DIAGNOSIS — E860 Dehydration: Secondary | ICD-10-CM | POA: Diagnosis present

## 2023-02-28 DIAGNOSIS — R7989 Other specified abnormal findings of blood chemistry: Secondary | ICD-10-CM | POA: Insufficient documentation

## 2023-02-28 DIAGNOSIS — I2609 Other pulmonary embolism with acute cor pulmonale: Secondary | ICD-10-CM | POA: Diagnosis not present

## 2023-02-28 DIAGNOSIS — I82433 Acute embolism and thrombosis of popliteal vein, bilateral: Secondary | ICD-10-CM | POA: Diagnosis present

## 2023-02-28 DIAGNOSIS — Z8546 Personal history of malignant neoplasm of prostate: Secondary | ICD-10-CM | POA: Diagnosis not present

## 2023-02-28 DIAGNOSIS — E119 Type 2 diabetes mellitus without complications: Secondary | ICD-10-CM | POA: Diagnosis not present

## 2023-02-28 DIAGNOSIS — D630 Anemia in neoplastic disease: Secondary | ICD-10-CM | POA: Diagnosis present

## 2023-02-28 DIAGNOSIS — I3131 Malignant pericardial effusion in diseases classified elsewhere: Secondary | ICD-10-CM

## 2023-02-28 DIAGNOSIS — C250 Malignant neoplasm of head of pancreas: Secondary | ICD-10-CM | POA: Diagnosis present

## 2023-02-28 DIAGNOSIS — D638 Anemia in other chronic diseases classified elsewhere: Secondary | ICD-10-CM | POA: Diagnosis present

## 2023-02-28 DIAGNOSIS — A419 Sepsis, unspecified organism: Secondary | ICD-10-CM | POA: Diagnosis present

## 2023-02-28 DIAGNOSIS — I824Z3 Acute embolism and thrombosis of unspecified deep veins of distal lower extremity, bilateral: Secondary | ICD-10-CM | POA: Diagnosis present

## 2023-02-28 DIAGNOSIS — I3139 Other pericardial effusion (noninflammatory): Secondary | ICD-10-CM

## 2023-02-28 DIAGNOSIS — E871 Hypo-osmolality and hyponatremia: Secondary | ICD-10-CM | POA: Diagnosis present

## 2023-02-28 DIAGNOSIS — Z7189 Other specified counseling: Secondary | ICD-10-CM | POA: Diagnosis not present

## 2023-02-28 DIAGNOSIS — Z794 Long term (current) use of insulin: Secondary | ICD-10-CM | POA: Diagnosis not present

## 2023-02-28 LAB — TROPONIN I (HIGH SENSITIVITY): Troponin I (High Sensitivity): 11 ng/L (ref <18)

## 2023-02-28 LAB — BASIC METABOLIC PANEL
Anion gap: 11 (ref 5–15)
BUN: 16 mg/dL (ref 8–23)
CO2: 23 mmol/L (ref 22–32)
Calcium: 9.5 mg/dL (ref 8.9–10.3)
Chloride: 101 mmol/L (ref 98–111)
Creatinine, Ser: 0.57 mg/dL — ABNORMAL LOW (ref 0.61–1.24)
GFR, Estimated: >60 mL/min (ref >60)
Glucose, Bld: 254 mg/dL — ABNORMAL HIGH (ref 70–99)
Potassium: 4 mmol/L (ref 3.5–5.1)
Sodium: 135 mmol/L (ref 135–145)

## 2023-02-28 LAB — RESP PANEL BY RT-PCR (RSV, FLU A&B, COVID)  RVPGX2
Influenza A by PCR: NEGATIVE
Influenza B by PCR: NEGATIVE
Resp Syncytial Virus by PCR: NEGATIVE
SARS Coronavirus 2 by RT PCR: NEGATIVE

## 2023-02-28 LAB — CBC
HCT: 31.8 % — ABNORMAL LOW (ref 39.0–52.0)
Hemoglobin: 10.1 g/dL — ABNORMAL LOW (ref 13.0–17.0)
MCH: 28.8 pg (ref 26.0–34.0)
MCHC: 31.8 g/dL (ref 30.0–36.0)
MCV: 90.6 fL (ref 80.0–100.0)
Platelets: 324 10*3/uL (ref 150–400)
RBC: 3.51 MIL/uL — ABNORMAL LOW (ref 4.22–5.81)
RDW: 13.4 % (ref 11.5–15.5)
WBC: 18.1 10*3/uL — ABNORMAL HIGH (ref 4.0–10.5)
nRBC: 0 % (ref 0.0–0.2)

## 2023-02-28 LAB — APTT
aPTT: 74 s — ABNORMAL HIGH (ref 24–36)
aPTT: 70 s — ABNORMAL HIGH (ref 24–36)
aPTT: 71 s — ABNORMAL HIGH (ref 24–36)

## 2023-02-28 LAB — HEPATITIS PANEL, ACUTE
HCV Ab: NONREACTIVE
Hep A IgM: NONREACTIVE
Hep B C IgM: NONREACTIVE
Hepatitis B Surface Ag: NONREACTIVE

## 2023-02-28 LAB — HIV ANTIBODY (ROUTINE TESTING W REFLEX): HIV Screen 4th Generation wRfx: NONREACTIVE

## 2023-02-28 LAB — LACTIC ACID, PLASMA
Lactic Acid, Venous: 1.1 mmol/L (ref 0.5–1.9)
Lactic Acid, Venous: 1 mmol/L (ref 0.5–1.9)

## 2023-02-28 LAB — PROTIME-INR
INR: 2.5 — ABNORMAL HIGH (ref 0.8–1.2)
Prothrombin Time: 27.6 s — ABNORMAL HIGH (ref 11.4–15.2)

## 2023-02-28 LAB — CBG MONITORING, ED
Glucose-Capillary: 190 mg/dL — ABNORMAL HIGH (ref 70–99)
Glucose-Capillary: 198 mg/dL — ABNORMAL HIGH (ref 70–99)
Glucose-Capillary: 173 mg/dL — ABNORMAL HIGH (ref 70–99)

## 2023-02-28 LAB — HEMOGLOBIN A1C
Hgb A1c MFr Bld: 10.3 % — ABNORMAL HIGH (ref 4.8–5.6)
Mean Plasma Glucose: 248.91 mg/dL

## 2023-02-28 LAB — HEPARIN LEVEL (UNFRACTIONATED): Heparin Unfractionated: >1.1 [IU]/mL — ABNORMAL HIGH (ref 0.30–0.70)

## 2023-02-28 LAB — PROCALCITONIN: Procalcitonin: 2.3 ng/mL

## 2023-02-28 LAB — BRAIN NATRIURETIC PEPTIDE: B Natriuretic Peptide: 54.4 pg/mL (ref 0.0–100.0)

## 2023-02-28 MED ORDER — GADOBUTROL 1 MMOL/ML IV SOLN
7.0000 mL | Freq: Once | INTRAVENOUS | Status: AC | PRN
  Administered 2023-02-28: 7 mL via INTRAVENOUS

## 2023-02-28 MED ORDER — DEXTROSE 5 % IV SOLN: 500.0000 mg | Freq: Once | INTRAVENOUS | Status: DC

## 2023-02-28 MED ORDER — HEPARIN (PORCINE) 25000 UT/250ML-% IV SOLN: 16.0000 [IU]/kg/h | INTRAVENOUS | Status: DC

## 2023-02-28 MED ORDER — METOPROLOL TARTRATE 5 MG/5ML IV SOLN
5.0000 mg | INTRAVENOUS | Status: DC | PRN
  Administered 2023-03-03 – 2023-03-05 (×5): 5 mg via INTRAVENOUS
  Filled 2023-02-28 (×5): qty 5

## 2023-02-28 MED ORDER — AMLODIPINE BESYLATE 10 MG PO TABS
10.0000 mg | ORAL_TABLET | Freq: Every day | ORAL | Status: DC
  Administered 2023-02-28 – 2023-03-07 (×7): 10 mg via ORAL
  Filled 2023-02-28: qty 1
  Filled 2023-02-28: qty 2
  Filled 2023-02-28 (×6): qty 1

## 2023-02-28 MED ORDER — SODIUM CHLORIDE 0.9 % IV SOLN
1.0000 g | Freq: Once | INTRAVENOUS | Status: DC
  Filled 2023-02-28: qty 10

## 2023-02-28 MED ORDER — IPRATROPIUM-ALBUTEROL 0.5-2.5 (3) MG/3ML IN SOLN: 3.0000 mL | RESPIRATORY_TRACT | Status: DC | PRN

## 2023-02-28 MED ORDER — GUAIFENESIN ER 600 MG PO TB12
600.0000 mg | ORAL_TABLET | Freq: Two times a day (BID) | ORAL | Status: DC
  Administered 2023-02-28 – 2023-03-07 (×14): 600 mg via ORAL
  Filled 2023-02-28 (×15): qty 1

## 2023-02-28 MED ORDER — VITAMIN D (ERGOCALCIFEROL) 1.25 MG (50000 UNIT) PO CAPS
50000.0000 [IU] | ORAL_CAPSULE | ORAL | Status: DC
  Filled 2023-02-28: qty 1

## 2023-02-28 MED ORDER — SENNOSIDES-DOCUSATE SODIUM 8.6-50 MG PO TABS: 1.0000 | ORAL_TABLET | Freq: Every evening | ORAL | Status: DC | PRN

## 2023-02-28 MED ORDER — HYDRALAZINE HCL 20 MG/ML IJ SOLN: 10.0000 mg | INTRAMUSCULAR | Status: DC | PRN

## 2023-02-28 MED ORDER — MAGNESIUM HYDROXIDE 400 MG/5ML PO SUSP: 30.0000 mL | Freq: Every day | ORAL | Status: DC | PRN

## 2023-02-28 MED ORDER — TRAZODONE HCL 50 MG PO TABS
25.0000 mg | ORAL_TABLET | Freq: Every evening | ORAL | Status: DC | PRN
  Administered 2023-03-01 – 2023-03-04 (×4): 25 mg via ORAL
  Filled 2023-02-28 (×4): qty 1

## 2023-02-28 MED ORDER — OMEGA-3-ACID ETHYL ESTERS 1 G PO CAPS
1000.0000 mg | ORAL_CAPSULE | Freq: Every day | ORAL | Status: DC
  Administered 2023-02-28 – 2023-03-07 (×7): 1000 mg via ORAL
  Filled 2023-02-28 (×8): qty 1

## 2023-02-28 MED ORDER — HEPARIN (PORCINE) 25000 UT/250ML-% IV SOLN
1100.0000 [IU]/h | INTRAVENOUS | Status: DC
  Administered 2023-02-28 (×2): 1150 [IU]/h via INTRAVENOUS
  Administered 2023-03-02 (×2): 1100 [IU]/h via INTRAVENOUS
  Filled 2023-02-28 (×4): qty 250

## 2023-02-28 MED ORDER — LOSARTAN POTASSIUM 50 MG PO TABS
100.0000 mg | ORAL_TABLET | Freq: Every day | ORAL | Status: DC
  Administered 2023-02-28 – 2023-03-07 (×7): 100 mg via ORAL
  Filled 2023-02-28 (×8): qty 2

## 2023-02-28 MED ORDER — APIXABAN (ELIQUIS) VTE STARTER PACK (10MG AND 5MG): 5.0000 mg | ORAL_TABLET | Freq: Two times a day (BID) | ORAL | Status: DC

## 2023-02-28 MED ORDER — HEPARIN SODIUM (PORCINE) 5000 UNIT/ML IJ SOLN: 4000.0000 [IU] | Freq: Once | INTRAMUSCULAR | Status: DC

## 2023-02-28 MED ORDER — ONDANSETRON HCL 4 MG/2ML IJ SOLN: 4.0000 mg | Freq: Four times a day (QID) | INTRAMUSCULAR | Status: DC | PRN

## 2023-02-28 MED ORDER — HYDROCOD POLI-CHLORPHE POLI ER 10-8 MG/5ML PO SUER: 5.0000 mL | Freq: Two times a day (BID) | ORAL | Status: DC | PRN

## 2023-02-28 MED ORDER — SODIUM CHLORIDE 0.9 % IV SOLN
2.0000 g | Freq: Every day | INTRAVENOUS | Status: AC
  Administered 2023-02-28 – 2023-03-04 (×5): 2 g via INTRAVENOUS
  Filled 2023-02-28 (×6): qty 20

## 2023-02-28 MED ORDER — EMPAGLIFLOZIN 25 MG PO TABS
25.0000 mg | ORAL_TABLET | Freq: Every day | ORAL | Status: DC
  Administered 2023-02-28 – 2023-03-06 (×6): 25 mg via ORAL
  Filled 2023-02-28 (×8): qty 1

## 2023-02-28 MED ORDER — INSULIN GLARGINE-YFGN 100 UNIT/ML ~~LOC~~ SOLN
25.0000 [IU] | Freq: Every day | SUBCUTANEOUS | Status: DC
  Administered 2023-03-01: 25 [IU] via SUBCUTANEOUS
  Filled 2023-02-28 (×3): qty 0.25

## 2023-02-28 MED ORDER — SODIUM CHLORIDE 0.9 % IV SOLN: INTRAVENOUS | Status: AC

## 2023-02-28 MED ORDER — IBUPROFEN 600 MG PO TABS
600.0000 mg | ORAL_TABLET | Freq: Once | ORAL | Status: AC
Start: 2023-02-28 — End: 2023-02-28
  Administered 2023-02-28: 600 mg via ORAL
  Filled 2023-02-28: qty 1

## 2023-02-28 MED ORDER — INSULIN ASPART 100 UNIT/ML IJ SOLN
0.0000 [IU] | Freq: Three times a day (TID) | INTRAMUSCULAR | Status: DC
  Administered 2023-02-28 (×3): 2 [IU] via SUBCUTANEOUS
  Administered 2023-03-01: 1 [IU] via SUBCUTANEOUS
  Administered 2023-03-01: 3 [IU] via SUBCUTANEOUS
  Administered 2023-03-01: 7 [IU] via SUBCUTANEOUS
  Administered 2023-03-02: 3 [IU] via SUBCUTANEOUS
  Administered 2023-03-02 (×2): 2 [IU] via SUBCUTANEOUS
  Administered 2023-03-03 (×2): 1 [IU] via SUBCUTANEOUS
  Administered 2023-03-04: 3 [IU] via SUBCUTANEOUS
  Administered 2023-03-04: 6 [IU] via SUBCUTANEOUS
  Administered 2023-03-04: 3 [IU] via SUBCUTANEOUS
  Administered 2023-03-05: 2 [IU] via SUBCUTANEOUS
  Administered 2023-03-05: 3 [IU] via SUBCUTANEOUS
  Administered 2023-03-05: 1 [IU] via SUBCUTANEOUS
  Administered 2023-03-06 (×2): 2 [IU] via SUBCUTANEOUS
  Administered 2023-03-06: 1 [IU] via SUBCUTANEOUS
  Administered 2023-03-07: 2 [IU] via SUBCUTANEOUS
  Filled 2023-02-28 (×22): qty 1

## 2023-02-28 MED ORDER — DEXTROSE 5 % IV SOLN
500.0000 mg | Freq: Every day | INTRAVENOUS | Status: DC
  Administered 2023-02-28 – 2023-03-03 (×4): 500 mg via INTRAVENOUS
  Filled 2023-02-28 (×5): qty 5

## 2023-02-28 MED ORDER — ACETAMINOPHEN 325 MG PO TABS
650.0000 mg | ORAL_TABLET | Freq: Four times a day (QID) | ORAL | Status: DC | PRN
  Administered 2023-03-03 – 2023-03-06 (×4): 650 mg via ORAL
  Filled 2023-02-28 (×4): qty 2

## 2023-02-28 MED ORDER — ACETAMINOPHEN 650 MG RE SUPP: 650.0000 mg | Freq: Four times a day (QID) | RECTAL | Status: DC | PRN

## 2023-02-28 MED ORDER — FENTANYL CITRATE PF 50 MCG/ML IJ SOSY
50.0000 ug | PREFILLED_SYRINGE | Freq: Once | INTRAMUSCULAR | Status: AC
  Administered 2023-02-28: 50 ug via INTRAVENOUS
  Filled 2023-02-28: qty 1

## 2023-02-28 MED ORDER — INSULIN ASPART 100 UNIT/ML IJ SOLN
0.0000 [IU] | Freq: Every day | INTRAMUSCULAR | Status: DC
  Administered 2023-03-01: 5 [IU] via SUBCUTANEOUS
  Administered 2023-03-03 – 2023-03-04 (×2): 4 [IU] via SUBCUTANEOUS
  Filled 2023-02-28 (×4): qty 1

## 2023-02-28 MED ORDER — ATORVASTATIN CALCIUM 10 MG PO TABS
10.0000 mg | ORAL_TABLET | Freq: Every day | ORAL | Status: DC
  Administered 2023-02-28 – 2023-03-07 (×7): 10 mg via ORAL
  Filled 2023-02-28 (×8): qty 1

## 2023-02-28 MED ORDER — KETOROLAC TROMETHAMINE 30 MG/ML IJ SOLN
30.0000 mg | Freq: Three times a day (TID) | INTRAMUSCULAR | Status: AC | PRN
  Administered 2023-02-28 – 2023-03-04 (×10): 30 mg via INTRAVENOUS
  Filled 2023-02-28 (×10): qty 1

## 2023-02-28 MED ORDER — ONDANSETRON HCL 4 MG PO TABS: 4.0000 mg | ORAL_TABLET | Freq: Four times a day (QID) | ORAL | Status: DC | PRN

## 2023-02-28 MED ORDER — IPRATROPIUM-ALBUTEROL 0.5-2.5 (3) MG/3ML IN SOLN
3.0000 mL | Freq: Four times a day (QID) | RESPIRATORY_TRACT | Status: DC
  Administered 2023-02-28 – 2023-03-01 (×5): 3 mL via RESPIRATORY_TRACT
  Filled 2023-02-28 (×5): qty 3

## 2023-02-28 NOTE — Assessment & Plan Note (Addendum)
Continue inhalers as outpatient.

## 2023-02-28 NOTE — Progress Notes (Signed)
OT Cancellation Note  Patient Details Name: Tony Benson MRN: [id number] DOB: 1960-07-28   Cancelled Treatment:    Reason Eval/Treat Not Completed: Medical issues which prohibited therapy. OT order received and chart reviewed. Pt noted positive for small nonocclusive segmental/subsegmental pulmonary embolus within the left upper lobe; started on heparin 02/28/23 at 0405. Per protocol pt contraindicated for exertional activity at this time, will hold 24 hours and initiate services as pt medically appropriate to participate in therapy.    Kathie Dike, M.S. OTR/L  02/28/23, 11:01 AM  ascom [phone]

## 2023-02-28 NOTE — ED Provider Notes (Signed)
[facility] Provider Note    Event Date/Time   First MD Initiated Contact with Patient 02/28/23 0029     (approximate)   History   Shortness of Breath   HPI  Tony Benson is a 62 y.o. male who presents to the ED from home with a chief complaint of shortness of breath.  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  Denies recent surgery, immobilization, travel, hormone use or family history of clots.  Reports progressive shortness of breath with chest pain today as well as increased swelling to his right lower leg.  Denies fever/chills, cough, abdominal pain, nausea/vomiting or dizziness.     Past Medical History   Past Medical History:  Diagnosis Date   Asthma    Cancer (HCC)    prostate   Diabetes mellitus without complication (HCC)    type II   Hypertension    Sleep apnea    uses Cpap machine      Active Problem List   Patient Active Problem List   Diagnosis Date Noted   Acute pulmonary embolism (HCC) 02/28/2023   Weight loss, abnormal 02/24/2023   Localized swelling of both lower legs 02/24/2023   Elevated vitamin B12 level 02/24/2023   History of colonic polyps 02/05/2023   Family history of colon cancer in mother 02/05/2023   Preoperative evaluation to rule out surgical contraindication 08/17/2021   Diabetes mellitus without complication (HCC) 05/21/2021   Prostate cancer (HCC) 05/21/2021   Chronic right shoulder pain 02/24/2019   Chronic myofascial pain 02/24/2019   Family history of malignant neoplasm of gastrointestinal tract    Polyp of transverse colon    Rectal polyp    Diverticulosis of large intestine without diverticulitis    Cervical facet joint syndrome (C2,3,4,5) 02/05/2018   Arthropathy of cervical facet joint 02/05/2018   Cervicalgia 02/05/2018   Chronic pain syndrome 02/05/2018   Hypertension    Cellulitis and abscess of right thigh s/p I&D 09/24/2017 09/24/2017   Asthma, chronic  08/04/2013     Past Surgical History   Past Surgical History:  Procedure Laterality Date   CHOLECYSTECTOMY     COLONOSCOPY WITH PROPOFOL N/A 11/18/2018   Procedure: COLONOSCOPY WITH PROPOFOL;  Surgeon: Pasty Spillers, MD;  Location: [facility] ENDOSCOPY;  Service: Gastroenterology;  Laterality: N/A;   COLONOSCOPY WITH PROPOFOL N/A 02/05/2023   Procedure: COLONOSCOPY WITH PROPOFOL;  Surgeon: Toney Reil, MD;  Location: [facility] ENDOSCOPY;  Service: Gastroenterology;  Laterality: N/A;   LAPAROSCOPIC RETROPUBIC PROSTATECTOMY     2023   LUNG SURGERY     "ligation of thoracic duct"   PROSTATE BIOPSY N/A 09/21/2020   Procedure: PROSTATE BIOPSY [name];  Surgeon: [name], MD;  Location: [facility] ORS;  Service: Urology;  Laterality: N/A;     Home Medications   Prior to Admission medications   Medication Sig Start Date End Date Taking? Authorizing Provider  amLODipine (NORVASC) 10 MG tablet Take 10 mg by mouth daily. 09/10/17   [provider]  APIXABAN Everlene Balls) VTE STARTER PACK (10MG  AND 5MG ) Take as directed on package: start with two-5mg  tablets twice daily for 7 days. On day 8, switch to one-5mg  tablet twice daily. 02/26/23   Earna Coder, MD  atorvastatin (LIPITOR) 10 MG tablet Take 10 mg by mouth daily.    [provider]  JARDIANCE 25 MG TABS tablet Take 25 mg by mouth daily. 12/23/22   [provider]  losartan (COZAAR) 100  MG tablet Take 100 mg by mouth daily. 08/15/17   [provider]  metFORMIN (GLUCOPHAGE) 1000 MG tablet Take 1,000 mg by mouth 2 (two) times daily with a meal.    [provider]  mometasone-formoterol (DULERA) 100-5 MCG/ACT AERO Inhale 2 puffs into the lungs 2 (two) times daily as needed for wheezing or shortness of breath.    [provider]  Omega-3 Fatty Acids (FISH OIL) 1000 MG CAPS Take 1,000 mg by mouth daily.    [provider]  potassium chloride SA (KLOR-CON M20) 20 MEQ tablet  Take by mouth. Patient not taking: Reported on 02/24/2023 09/15/20   [provider]  RYBELSUS 7 MG TABS PLEASE SEE ATTACHED FOR DETAILED DIRECTIONS 12/21/22   [provider]  senna-docusate (SENOKOT-S) 8.6-50 MG tablet Take by mouth. 11/16/21   [provider]  sildenafil (VIAGRA) 50 MG tablet Take 50 mg by mouth every 3 (three) days.    [provider]  TOUJEO SOLOSTAR 300 UNIT/ML Solostar Pen SMARTSIG:25 Unit(s) SUB-Q Daily 12/02/22   [provider]  Vitamin D, Ergocalciferol, (DRISDOL) 50000 units CAPS capsule Take 50,000 Units by mouth every 7 (seven) days.    [provider]     Allergies  Other and Morphine and codeine   Family History   Family History  Problem Relation Age of Onset   Allergies Sister      Physical Exam  Triage Vital Signs: ED Triage Vitals  Encounter Vitals Group     BP 02/27/23 2007 106/63     Systolic BP Percentile --      Diastolic BP Percentile --      Pulse Rate 02/27/23 2007 (!) 117     Resp 02/27/23 2007 18     Temp 02/27/23 2007 98.2 F (36.8 C)     Temp Source 02/27/23 2007 Oral     SpO2 02/27/23 2007 96 %     Weight 02/27/23 2008 149 lb 14.6 oz (68 kg)     Height 02/27/23 2008 5\' 9"  (1.753 m)     Head Circumference --      Peak Flow --      Pain Score 02/27/23 2008 0     Pain Loc --      Pain Education --      Exclude from Growth Chart --     Updated Vital Signs: BP 111/69   Pulse 93   Temp 98.4 F (36.9 C)   Resp 18   Ht 5\' 9"  (1.753 m)   Wt 68 kg   SpO2 96%   BMI 22.14 kg/m    General: Awake, mild distress.  CV:  RRR.  Good peripheral perfusion.  Resp:  Normal effort.  CTAB. Abd:  Nontender.  No distention.  Other:  RLE swelling.   ED Results / Procedures / Treatments  Labs (all labs ordered are listed, but only abnormal results are displayed) Labs Reviewed  BASIC METABOLIC PANEL - Abnormal; Notable for the following components:      Result Value   Sodium 132  (*)    Glucose, Bld 301 (*)    All other components within normal limits  CBC - Abnormal; Notable for the following components:   WBC 17.7 (*)    RBC 4.07 (*)    Hemoglobin 11.2 (*)    HCT 34.9 (*)    All other components within normal limits  PROTIME-INR - Abnormal; Notable for the following components:   Prothrombin Time 27.6 (*)  INR 2.5 (*)    All other components within normal limits  APTT - Abnormal; Notable for the following components:   aPTT 74 (*)    All other components within normal limits  HEPARIN LEVEL (UNFRACTIONATED) - Abnormal; Notable for the following components:   Heparin Unfractionated >1.10 (*)    All other components within normal limits  RESP PANEL BY RT-PCR (RSV, FLU A&B, COVID)  RVPGX2  CULTURE, BLOOD (ROUTINE X 2)  CULTURE, BLOOD (ROUTINE X 2)  APTT  LACTIC ACID, PLASMA  LACTIC ACID, PLASMA  TROPONIN I (HIGH SENSITIVITY)  TROPONIN I (HIGH SENSITIVITY)     EKG  ED ECG REPORT I, Yecheskel Kurek J, the attending physician, personally viewed and interpreted this ECG.   Date: 02/28/2023  EKG Time: 2006  Rate: 116  Rhythm: sinus tachycardia  Axis: Normal  Intervals:none  ST&T Change: Nonspecific    RADIOLOGY I have independently visualized and interpreted patient's imaging studies as well as noted the radiology interpretation:  Chest x-ray: Airspace disease, possible pneumonia  CTA chest: Small submental PE, moderate pericardial effusion, adenopathy, reactive versus metastatic, hepatic masses  Official radiology report(s): CT Angio Chest PE W and/or Wo Contrast  Result Date: 02/27/2023 CLINICAL DATA:  Shortness of breath blood clots in leg EXAM: CT ANGIOGRAPHY CHEST WITH CONTRAST TECHNIQUE: Multidetector CT imaging of the chest was performed using the standard protocol during bolus administration of intravenous contrast. Multiplanar CT image reconstructions and MIPs were obtained to evaluate the vascular anatomy. RADIATION DOSE REDUCTION: This  exam was performed according to the departmental dose-optimization program which includes automated exposure control, adjustment of the mA and/or kV according to patient size and/or use of iterative reconstruction technique. CONTRAST:  75mL OMNIPAQUE IOHEXOL 350 MG/ML SOLN COMPARISON:  Chest x-ray 02/27/2023 FINDINGS: Cardiovascular: Satisfactory opacification of the pulmonary arteries to the segmental level. Small nonocclusive segmental/subsegmental embolus within the left upper lobe, series 6, image 168. No other discrete emboli are visualized. Mild atherosclerosis. No aneurysm or dissection. Normal cardiac size. Moderate to large circumferential pericardial effusion measuring up to 2.1 cm, slight increased density values suggesting complex fluid. Mediastinum/Nodes: Midline trachea. No thyroid mass. Subcarinal nodes measuring at least up to 2 cm. Esophagus within normal limits. Small clips in the posterior mediastinum. Lungs/Pleura: Small circumferential right pleural effusion. Calcifications or sutures at the posterior right lung base. Prominent peribronchovascular thickening within the right perihilar region and within the right middle and lower lobe bronchi. Multifocal bilateral ground-glass densities and areas of tree-in-bud density suspicious for respiratory infection Upper Abdomen: Large hypodense right hepatic mass suspected, this measures at least 12.6 cm. Another hypodense inferior right hepatic lobe lesion measuring 2.8 cm on series 4, image 153 incompletely assessed. Musculoskeletal: No acute osseous abnormality. Review of the MIP images confirms the above findings. IMPRESSION: 1. Positive for small nonocclusive segmental/subsegmental pulmonary embolus within the left upper lobe. 2. Moderate to large circumferential pericardial effusion measuring up to 2.1 cm, slight increased density values suggesting complex fluid. Recommend correlation with echocardiogram 3. Small circumferential right pleural  effusion. Prominent peribronchovascular thickening within the right perihilar region and within the right upper, middle and lower lobe bronchi, with mild mucous plugging in the right lower lobe. Multifocal bilateral ground-glass densities and areas of tree-in-bud density suspicious for respiratory infection/pneumonia, possible atypical infection. 4. Subcarinal adenopathy, reactive versus metastatic. 5. Right hepatic masses measuring up to 12.6 cm. Findings are suspicious for metastatic disease versus liver carcinoma. When the patient is clinically stable and able to follow directions and hold  their breath (preferably as an outpatient) further evaluation with dedicated abdominal MRI should be considered. Critical Value/emergent results were called by telephone at the time of interpretation on 02/27/2023 at 11:02 pm to provider [facility] , who verbally acknowledged these results. Electronically Signed   By: Jasmine Pang M.D.   On: 02/27/2023 23:04   DG Chest 2 View  Result Date: 02/27/2023 CLINICAL DATA:  Shortness of breath EXAM: CHEST - 2 VIEW COMPARISON:  08/04/2013 FINDINGS: Left lung is grossly clear. Normal cardiac size. Small loculated pleural effusion versus pleural thickening. Airspace disease at the right middle lobe and right base. No pneumothorax IMPRESSION: Airspace disease at the right middle lobe and right base, possible pneumonia. Small loculated pleural effusion versus pleural thickening. Electronically Signed   By: Jasmine Pang M.D.   On: 02/27/2023 22:45     PROCEDURES:  Critical Care performed: Yes, see critical care procedure note(s)  CRITICAL CARE Performed by: Irean Hong   Total critical care time: 30 minutes  Critical care time was exclusive of separately billable procedures and treating other patients.  Critical care was necessary to treat or prevent imminent or life-threatening deterioration.  Critical care was time spent personally by me on the following  activities: development of treatment plan with patient and/or surrogate as well as nursing, discussions with consultants, evaluation of patient's response to treatment, examination of patient, obtaining history from patient or surrogate, ordering and performing treatments and interventions, ordering and review of laboratory studies, ordering and review of radiographic studies, pulse oximetry and re-evaluation of patient's condition.   Marland Kitchen1-3 Lead EKG Interpretation  Performed by: Irean Hong, MD Authorized by: Irean Hong, MD     Interpretation: normal     ECG rate:  95   ECG rate assessment: normal     Rhythm: sinus rhythm     Ectopy: none     Conduction: normal   Comments:     Placed on cardiac monitor to evaluate for arrhythmias    MEDICATIONS ORDERED IN ED: Medications  heparin ADULT infusion 100 units/mL (25000 units/226mL) (has no administration in time range)  cefTRIAXone (ROCEPHIN) 1 g in sodium chloride 0.9 % 100 mL IVPB (has no administration in time range)  azithromycin (ZITHROMAX) 500 mg in dextrose 5 % 250 mL IVPB (has no administration in time range)  iohexol (OMNIPAQUE) 350 MG/ML injection 75 mL (75 mLs Intravenous Contrast Given 02/27/23 2054)  ibuprofen (ADVIL) tablet 600 mg (600 mg Oral Given 02/28/23 0114)  fentaNYL (SUBLIMAZE) injection 50 mcg (50 mcg Intravenous Given 02/28/23 0115)     IMPRESSION / MDM / ASSESSMENT AND PLAN / ED COURSE  I reviewed the triage vital signs and the nursing notes.                             62 year old male presenting with shortness of breath. Differential includes, but is not limited to, viral syndrome, bronchitis including COPD exacerbation, pneumonia, reactive airway disease including asthma, CHF including exacerbation with or without pulmonary/interstitial edema, pneumothorax, ACS, thoracic trauma, and pulmonary embolism.  Personally reviewed patient's records and note an oncology office visit on 02/24/2023.  Patient's  presentation is most consistent with acute presentation with potential threat to life or bodily function.  The patient is on the cardiac monitor to evaluate for evidence of arrhythmia and/or significant heart rate changes.  Laboratory results demonstrate leukocytosis with WBC 17.7, unremarkable electrolytes, negative troponin.  Will initiate heparin bolus  with drip for PE.  Check blood cultures, lactic acid, cover empirically for possible pneumonia seen on chest x-ray.  Will consult hospitalist services for evaluation and admission.      FINAL CLINICAL IMPRESSION(S) / ED DIAGNOSES   Final diagnoses:  Shortness of breath  Other acute pulmonary embolism without acute cor pulmonale (HCC)  Pericardial effusion  Community acquired pneumonia, unspecified laterality     Rx / DC Orders   ED Discharge Orders     None        Note:  This document was prepared using Dragon voice recognition software and may include unintentional dictation errors.   Irean Hong, MD 02/28/23 [phone]

## 2023-02-28 NOTE — Assessment & Plan Note (Addendum)
Continue Cozaar, Norvasc and patient also on metoprolol.  On increased metoprolol dose with tachycardia.

## 2023-02-28 NOTE — Progress Notes (Signed)
PROGRESS NOTE    Tony Benson  QMV:[id number] DOB: [birth year] DOA: 02/28/2023 PCP: Sherrie Mustache, MD     Brief Narrative:  62 year old with history of asthma, OSA on CPAP, DM2, prostate cancer comes to the ED for worsening dyspnea.  Patient was recently diagnosed with right lower extremity DVT and placed on Eliquis.  Now CTA chest shows small nonocclusive left upper lobe PE with moderate to large circumferential pericardial effusion and right-sided pleural effusion with groundglass opacity.  Also found to have a right hepatic mass of 12.6 cm.   Assessment & Plan:  Principal Problem:   Acute pulmonary embolism (HCC) Active Problems:   Sepsis due to pneumonia (HCC)   Type 2 diabetes mellitus without complications (HCC)   Essential hypertension   Dyslipidemia   Asthma, chronic, unspecified asthma severity, with acute exacerbation   * Acute pulmonary embolism (HCC) without cor pulmonale Mod-Large Pericardial and Small rightpleural effusion Bilateral lower extremity DVTs Currently patient is on heparin drip.  Eventually will transition to Eliquis.  Echocardiogram has been ordered.  Lower extremity Dopplers on 11/13 already showed bilateral lower extremity DVTs.   Sepsis due to pneumonia [facility]) Multifocal groundglass opacity in the lungs Possible concerns of sepsis.  Will check BNP, procalcitonin.  Continue azithromycin and Rocephin.  Liver mass, right 12.6cm - Will obtain MRI liver to further characterize this.  Check AFP, acute hepatitis panel   Type 2 diabetes mellitus without complications (HCC) Sliding scale and Accu-Chek.  On Jardiance, holding metformin.     Essential hypertension On Norvasc, losartan.  IV as needed   Chronic asthma Bronchodilators   Dyslipidemia Lipitor  History of prostate cancer status post prostatectomy - Follows oncology/urology with [facility]  PT/OT  DVT prophylaxis: Hep Drip Code Status: Full  Family Communication:   Status is:  Inpatient Multiple ongoing issues.  Continue hospital stay.    Subjective: Seen at bedside, tells me after getting Toradol his shoulder pain is better. Patient declines having known about any prior liver mass.  At this time he would like to proceed [place] with further workup as indicated.   Examination:  General exam: Appears calm and comfortable  Respiratory system: Clear to auscultation. Respiratory effort normal. Cardiovascular system: S1 & S2 heard, RRR. No JVD, murmurs, rubs, gallops or clicks. No pedal edema. Gastrointestinal system: Abdomen is nondistended, soft and nontender. No organomegaly or masses felt. Normal bowel sounds heard. Central nervous system: Alert and oriented. No focal neurological deficits. Extremities: Symmetric 5 x 5 power. Skin: No rashes, lesions or ulcers Psychiatry: Judgement and insight appear normal. Mood & affect appropriate.                   Diet Orders (From admission, onward)     Start     Ordered   02/28/23 1006  Diet Carb Modified Fluid consistency: Thin; Room service appropriate? Yes  Diet effective now       Question Answer Comment  Diet-HS Snack? Nothing   Calorie Level Medium 1600-2000   Fluid consistency: Thin   Room service appropriate? Yes      02/28/23 1005            Objective: Vitals:   02/28/23 1021 02/28/23 1100 02/28/23 1118 02/28/23 1119  BP: 130/73 136/64    Pulse: 100 (!) 115    Resp: 20 (!) 32    Temp: 98.2 F (36.8 C)   98.5 F (36.9 C)  TempSrc: Oral   Oral  SpO2: 100%  100%  Weight:      Height:        Intake/Output Summary (Last 24 hours) at 02/28/2023 1330 Last data filed at 02/28/2023 0724 Gross per 24 hour  Intake 100 ml  Output 700 ml  Net -600 ml   Filed Weights   02/27/23 2008  Weight: 68 kg    Scheduled Meds:  amLODipine  10 mg Oral Daily   atorvastatin  10 mg Oral Daily   empagliflozin  25 mg Oral Daily   guaiFENesin  600 mg Oral BID   insulin aspart  0-5 Units  Subcutaneous QHS   insulin aspart  0-9 Units Subcutaneous TID WC   insulin glargine-yfgn  25 Units Subcutaneous Daily   ipratropium-albuterol  3 mL Nebulization QID   losartan  100 mg Oral Daily   omega-3 acid ethyl esters  1,000 mg Oral Daily   [START ON 03/03/2023] Vitamin D (Ergocalciferol)  50,000 Units Oral Q7 days   Continuous Infusions:  sodium chloride 100 mL/hr at 02/28/23 0753   azithromycin Stopped (02/28/23 0437)   cefTRIAXone (ROCEPHIN)  IV Stopped (02/28/23 0429)   heparin 1,150 Units/hr (02/28/23 0405)    Nutritional status     Body mass index is 22.14 kg/m.  Data Reviewed:   CBC: Recent Labs  Lab 02/24/23 1447 02/27/23 2018 02/28/23 0517  WBC 19.0* 17.7* 18.1*  NEUTROABS 16.5*  --   --   HGB 11.4* 11.2* 10.1*  HCT 37.2* 34.9* 31.8*  MCV 88.8 85.7 90.6  PLT 366 369 324   Basic Metabolic Panel: Recent Labs  Lab 02/24/23 1447 02/27/23 2018 02/28/23 0517  NA 136 132* 135  K 4.9 4.2 4.0  CL 100 101 101  CO2 24 23 23   GLUCOSE 207* 301* 254*  BUN 15 17 16   CREATININE 0.76 0.78 0.57*  CALCIUM 10.0 9.5 9.5   GFR: Estimated Creatinine Clearance: 93.3 mL/min (A) (by C-G formula based on SCr of 0.57 mg/dL (L)). Liver Function Tests: Recent Labs  Lab 02/24/23 1447  AST 18  ALT 17  ALKPHOS 277*  BILITOT 0.9  PROT 7.1  ALBUMIN 2.7*   No results for input(s): "LIPASE", "AMYLASE" in the last 168 hours. No results for input(s): "AMMONIA" in the last 168 hours. Coagulation Profile: Recent Labs  Lab 02/28/23 0058  INR 2.5*   Cardiac Enzymes: No results for input(s): "CKTOTAL", "CKMB", "CKMBINDEX", "TROPONINI" in the last 168 hours. BNP (last 3 results) No results for input(s): "PROBNP" in the last 8760 hours. HbA1C: No results for input(s): "HGBA1C" in the last 72 hours. CBG: Recent Labs  Lab 02/28/23 0819 02/28/23 1122  GLUCAP 190* 198*   Lipid Profile: No results for input(s): "CHOL", "HDL", "LDLCALC", "TRIG", "CHOLHDL", "LDLDIRECT"  in the last 72 hours. Thyroid Function Tests: No results for input(s): "TSH", "T4TOTAL", "FREET4", "T3FREE", "THYROIDAB" in the last 72 hours. Anemia Panel: No results for input(s): "VITAMINB12", "FOLATE", "FERRITIN", "TIBC", "IRON", "RETICCTPCT" in the last 72 hours. Sepsis Labs: Recent Labs  Lab 02/28/23 0256 02/28/23 0516  PROCALCITON  --  2.30  LATICACIDVEN 1.1 1.0    Recent Results (from the past 240 hour(s))  Resp panel by RT-PCR (RSV, Flu A&B, Covid) Anterior Nasal Swab     Status: None   Collection Time: 02/28/23 12:58 AM   Specimen: Anterior Nasal Swab  Result Value Ref Range Status   SARS Coronavirus 2 by RT PCR NEGATIVE NEGATIVE Final    Comment: (NOTE) SARS-CoV-2 target nucleic acids are NOT DETECTED.  The SARS-CoV-2 RNA is  generally detectable in upper respiratory specimens during the acute phase of infection. The lowest concentration of SARS-CoV-2 viral copies this assay can detect is 138 copies/mL. A negative result does not preclude SARS-Cov-2 infection and should not be used as the sole basis for treatment or other patient management decisions. A negative result may occur with  improper specimen collection/handling, submission of specimen other than nasopharyngeal swab, presence of viral mutation(s) within the areas targeted by this assay, and inadequate number of viral copies(<138 copies/mL). A negative result must be combined with clinical observations, patient history, and epidemiological information. The expected result is Negative.  Fact Sheet for Patients:  BloggerCourse.com  Fact Sheet for Healthcare Providers:  SeriousBroker.it  This test is no t yet approved or cleared by the [place] FDA and  has been authorized for detection and/or diagnosis of SARS-CoV-2 by FDA under an Emergency Use Authorization (EUA). This EUA will remain  in effect (meaning this test can be used) for the duration of  the COVID-19 declaration under Section 564(b)(1) of the Act, 21 U.S.C.section 360bbb-3(b)(1), unless the authorization is terminated  or revoked sooner.       Influenza A by PCR NEGATIVE NEGATIVE Final   Influenza B by PCR NEGATIVE NEGATIVE Final    Comment: (NOTE) The Xpert Xpress SARS-CoV-2/FLU/RSV plus assay is intended as an aid in the diagnosis of influenza from Nasopharyngeal swab specimens and should not be used as a sole basis for treatment. Nasal washings and aspirates are unacceptable for Xpert Xpress SARS-CoV-2/FLU/RSV testing.  Fact Sheet for Patients: BloggerCourse.com  Fact Sheet for Healthcare Providers: SeriousBroker.it  This test is not yet approved or cleared by the [place] FDA and has been authorized for detection and/or diagnosis of SARS-CoV-2 by FDA under an Emergency Use Authorization (EUA). This EUA will remain in effect (meaning this test can be used) for the duration of the COVID-19 declaration under Section 564(b)(1) of the Act, 21 U.S.C. section 360bbb-3(b)(1), unless the authorization is terminated or revoked.     Resp Syncytial Virus by PCR NEGATIVE NEGATIVE Final    Comment: (NOTE) Fact Sheet for Patients: BloggerCourse.com  Fact Sheet for Healthcare Providers: SeriousBroker.it  This test is not yet approved or cleared by the [place] FDA and has been authorized for detection and/or diagnosis of SARS-CoV-2 by FDA under an Emergency Use Authorization (EUA). This EUA will remain in effect (meaning this test can be used) for the duration of the COVID-19 declaration under Section 564(b)(1) of the Act, 21 U.S.C. section 360bbb-3(b)(1), unless the authorization is terminated or revoked.  Performed at [facility], [address] [place], [place] [address]          Radiology Studies: CT Angio Chest PE W and/or Wo  Contrast  Result Date: 02/27/2023 CLINICAL DATA:  Shortness of breath blood clots in leg EXAM: CT ANGIOGRAPHY CHEST WITH CONTRAST TECHNIQUE: Multidetector CT imaging of the chest was performed using the standard protocol during bolus administration of intravenous contrast. Multiplanar CT image reconstructions and MIPs were obtained to evaluate the vascular anatomy. RADIATION DOSE REDUCTION: This exam was performed according to the departmental dose-optimization program which includes automated exposure control, adjustment of the mA and/or kV according to patient size and/or use of iterative reconstruction technique. CONTRAST:  75mL OMNIPAQUE IOHEXOL 350 MG/ML SOLN COMPARISON:  Chest x-ray 02/27/2023 FINDINGS: Cardiovascular: Satisfactory opacification of the pulmonary arteries to the segmental level. Small nonocclusive segmental/subsegmental embolus within the left upper lobe, series 6, image 168. No other discrete emboli  are visualized. Mild atherosclerosis. No aneurysm or dissection. Normal cardiac size. Moderate to large circumferential pericardial effusion measuring up to 2.1 cm, slight increased density values suggesting complex fluid. Mediastinum/Nodes: Midline trachea. No thyroid mass. Subcarinal nodes measuring at least up to 2 cm. Esophagus within normal limits. Small clips in the posterior mediastinum. Lungs/Pleura: Small circumferential right pleural effusion. Calcifications or sutures at the posterior right lung base. Prominent peribronchovascular thickening within the right perihilar region and within the right middle and lower lobe bronchi. Multifocal bilateral ground-glass densities and areas of tree-in-bud density suspicious for respiratory infection Upper Abdomen: Large hypodense right hepatic mass suspected, this measures at least 12.6 cm. Another hypodense inferior right hepatic lobe lesion measuring 2.8 cm on series 4, image 153 incompletely assessed. Musculoskeletal: No acute osseous  abnormality. Review of the MIP images confirms the above findings. IMPRESSION: 1. Positive for small nonocclusive segmental/subsegmental pulmonary embolus within the left upper lobe. 2. Moderate to large circumferential pericardial effusion measuring up to 2.1 cm, slight increased density values suggesting complex fluid. Recommend correlation with echocardiogram 3. Small circumferential right pleural effusion. Prominent peribronchovascular thickening within the right perihilar region and within the right upper, middle and lower lobe bronchi, with mild mucous plugging in the right lower lobe. Multifocal bilateral ground-glass densities and areas of tree-in-bud density suspicious for respiratory infection/pneumonia, possible atypical infection. 4. Subcarinal adenopathy, reactive versus metastatic. 5. Right hepatic masses measuring up to 12.6 cm. Findings are suspicious for metastatic disease versus liver carcinoma. When the patient is clinically stable and able to follow directions and hold their breath (preferably as an outpatient) further evaluation with dedicated abdominal MRI should be considered. Critical Value/emergent results were called by telephone at the time of interpretation on 02/27/2023 at 11:02 pm to provider [facility] , who verbally acknowledged these results. Electronically Signed   By: Jasmine Pang M.D.   On: 02/27/2023 23:04   DG Chest 2 View  Result Date: 02/27/2023 CLINICAL DATA:  Shortness of breath EXAM: CHEST - 2 VIEW COMPARISON:  08/04/2013 FINDINGS: Left lung is grossly clear. Normal cardiac size. Small loculated pleural effusion versus pleural thickening. Airspace disease at the right middle lobe and right base. No pneumothorax IMPRESSION: Airspace disease at the right middle lobe and right base, possible pneumonia. Small loculated pleural effusion versus pleural thickening. Electronically Signed   By: Jasmine Pang M.D.   On: 02/27/2023 22:45   US Venous Img Lower Bilateral  (DVT)  Result Date: 02/26/2023 CLINICAL DATA:  Leg swelling for 2 weeks EXAM: BILATERAL LOWER EXTREMITY VENOUS DOPPLER ULTRASOUND TECHNIQUE: Gray-scale sonography with graded compression, as well as color Doppler and duplex ultrasound were performed to evaluate the lower extremity deep venous systems from the level of the common femoral vein and including the common femoral, femoral, profunda femoral, popliteal and calf veins including the posterior tibial, peroneal and gastrocnemius veins when visible. The superficial great saphenous vein was also interrogated. Spectral Doppler was utilized to evaluate flow at rest and with distal augmentation maneuvers in the common femoral, femoral and popliteal veins. COMPARISON:  None Available. FINDINGS: RIGHT LOWER EXTREMITY Common Femoral Vein: No evidence of thrombus. Normal compressibility, respiratory phasicity and response to augmentation. Saphenofemoral Junction: No evidence of thrombus. Normal compressibility and flow on color Doppler imaging. Profunda Femoral Vein: No evidence of thrombus. Normal compressibility and flow on color Doppler imaging. Femoral Vein: Poor flow on Doppler. Poor compression with hypoechoic material. Popliteal Vein: Poor flow on Doppler with poor compression and luminal hypoechoic appearance Calf Veins:  Areas of thrombus along the posterior tibial and peroneal vein. Superficial Great Saphenous Vein: No evidence of thrombus. Normal compressibility. Venous Reflux:  None. Other Findings:  None. LEFT LOWER EXTREMITY Common Femoral Vein: No evidence of thrombus. Normal compressibility, respiratory phasicity and response to augmentation. Saphenofemoral Junction: No evidence of thrombus. Normal compressibility and flow on color Doppler imaging. Profunda Femoral Vein: No evidence of thrombus. Normal compressibility and flow on color Doppler imaging. Femoral Vein: No evidence of thrombus. Normal compressibility, respiratory phasicity and response to  augmentation. Popliteal Vein: Incomplete compression and poor flow on Doppler, color and spectral Calf Veins: Areas of thrombus in the peroneal and posterior tibial veins. Superficial Great Saphenous Vein: No evidence of thrombus. Normal compressibility. Venous Reflux:  None. Other Findings:  None. Critical Value/emergent results were called by telephone at the time of interpretation on 02/26/2023 at 6:04 pm to provider [facility] , who verbally acknowledged these results. IMPRESSION: Bilateral DVT. Right femoral vein down through the calf veins. Left popliteal and calf veins. Electronically Signed   By: Karen Kays M.D.   On: 02/26/2023 18:14           LOS: 0 days   Time spent= 35 mins    Miguel Rota, MD [facility]  If 7PM-7AM, please contact night-coverage  02/28/2023, 1:30 PM

## 2023-02-28 NOTE — Progress Notes (Signed)
ANTICOAGULATION CONSULT NOTE  Pharmacy Consult for heparin infusion Indication: pulmonary embolus  Allergies  Allergen Reactions   Other Anaphylaxis    peanuts   Morphine And Codeine Itching   Tree Extract Other (See Comments)    ALMONDS  ---- EYE/ FACIAL SWELLING   Wound Dressing Adhesive Rash    Patient Measurements: Height: 5\' 9"  (175.3 cm) Weight: 68 kg (149 lb 14.6 oz) IBW/kg (Calculated) : 70.7 Heparin Dosing Weight: 68 kg  Vital Signs: Temp: 99 F (37.2 C) (11/15 1937) Temp Source: Oral (11/15 1937) BP: 125/71 (11/15 1937) Pulse Rate: 122 (11/15 1937)  Labs: Recent Labs    02/27/23 0058 02/27/23 2018 02/28/23 0058 02/28/23 0517 02/28/23 1228 02/28/23 1917  HGB  --  11.2*  --  10.1*  --   --   HCT  --  34.9*  --  31.8*  --   --   PLT  --  369  --  324  --   --   APTT  --   --  74*  --  70* 71*  LABPROT  --   --  27.6*  --   --   --   INR  --   --  2.5*  --   --   --   HEPARINUNFRC  --   --  >1.10*  --   --   --   CREATININE  --  0.78  --  0.57*  --   --   TROPONINIHS 11 9  --   --   --   --     Estimated Creatinine Clearance: 93.3 mL/min (A) (by C-G formula based on SCr of 0.57 mg/dL (L)).   Medical History: Past Medical History:  Diagnosis Date   Asthma    Cancer (HCC)    prostate   Diabetes mellitus without complication (HCC)    type II   Hypertension    Sleep apnea    uses Cpap machine     Medications:  PTA Meds: Apixaban Starter Pack Started 02/26/23, last dose reported 02/27/23 at 1800.  Assessment: Pt is a 62 yo male started on Eliquis 02/26/23, for bilateral lower extremity DVTs,  presenting to ED c/o SOB now " positive for small nonocclusive segmental/subsegmental pulmonary embolus within the left upper lobe."  Goal of Therapy:  Heparin level 0.3-0.7 units/ml aPTT 66-102 seconds Monitor platelets by anticoagulation protocol: Yes  Monitoring: 11/15 1228 aPTT 70 sec Therapeutic x 1 11/15 1917 aPTT 71 Therapeutic x 2    Plan: 11/15  1917  aPTT 71 Therapeutic x 2 Continue heparin infusion at 1150 units/hr Check aPTT with am labs, continue to titrate by aPTT until it and antiXa correlate CBC daily while on heparin   Bari Mantis PharmD Clinical Pharmacist 02/28/2023

## 2023-02-28 NOTE — Assessment & Plan Note (Addendum)
This was present on admission.  Patient completed 5 days of antibiotics of Rocephin and Zithromax.  Patient had another fever of 102 overnight on 11/20.  Chest x-ray showing worsening pneumonia.  Patient started on Unasyn but switched over to Maxipime.  Patient feeling better and no further fevers and wanted to go home.  Will give a dose of Maxipime prior to discharge and discharged home on Levaquin for 5 days every evening.

## 2023-02-28 NOTE — H&P (Addendum)
PATIENT NAME: Tony Benson    MR#:  [id number]  DATE OF BIRTH:  1960-12-12  DATE OF ADMISSION:  02/28/2023  PRIMARY CARE PHYSICIAN: Sherrie Mustache, MD   Patient is coming from: Home  REQUESTING/REFERRING PHYSICIAN: Chiquita Loth, MD  CHIEF COMPLAINT:   Chief Complaint  Patient presents with   Shortness of Breath    HISTORY OF PRESENT ILLNESS:  Tony Benson is a 62 y.o. male with medical history significant for asthma, OSA on CPAP, type II diabetes mellitus and prostate cancer, who presented to the emergency room with acute onset of worsening dyspnea.  The patient was recently seen and diagnosed with right lower extremity DVT for which she was placed on Eliquis that he has been taking.  He denied any recent surgeries or travels.  He admitted to associated chest pain mainly increasing with deep breathing and worsening swelling in the right lower leg.  No fever or chills.  No nausea or vomiting or abdominal pain.  No bleeding diathesis.  No dysuria, oliguria or hematuria or flank pain.  ED Course: When he came to the ER heart rate was 117 with otherwise normal vital signs.  Labs revealed hyponatremia 132 with blood glucose of 301 and CBC showed leukocytosis of 17.7 with anemia. EKG as reviewed by me : EKG showed sinus tachycardia with a rate of 116 with occasional PVCs. Imaging: Two-view chest x-ray showed airspace disease in the right middle lobe and right base suspicious for pneumonia with small loculated pleural effusion versus pleural thickening. Chest CTA revealed the following: 1. Positive for small nonocclusive segmental/subsegmental pulmonary embolus within the left upper lobe. 2. Moderate to large circumferential pericardial effusion measuring up to 2.1 cm, slight increased density values suggesting complex fluid. Recommend correlation with echocardiogram 3. Small circumferential right pleural effusion. Prominent peribronchovascular thickening within the  right perihilar region and within the right upper, middle and lower lobe bronchi, with mild mucous plugging in the right lower lobe. Multifocal bilateral ground-glass densities and areas of tree-in-bud density suspicious for respiratory infection/pneumonia, possible atypical infection. 4. Subcarinal adenopathy, reactive versus metastatic. 5. Right hepatic masses measuring up to 12.6 cm. Findings are suspicious for metastatic disease versus liver carcinoma. When the patient is clinically stable and able to follow directions and hold their breath (preferably as an outpatient) further evaluation with dedicated abdominal MRI should be considered.  The patient was given 600 mg of p.o. Advil and 50 mcg of IV fentanyl as well as IV Rocephin and Zithromax.  He was started on IV heparin.  He will be admitted to a progressive unit bed for further evaluation and management. PAST MEDICAL HISTORY:   Past Medical History:  Diagnosis Date   Asthma    Cancer (HCC)    prostate   Diabetes mellitus without complication (HCC)    type II   Hypertension    Sleep apnea    uses Cpap machine     PAST SURGICAL HISTORY:   Past Surgical History:  Procedure Laterality Date   CHOLECYSTECTOMY     COLONOSCOPY WITH PROPOFOL N/A 11/18/2018   Procedure: COLONOSCOPY WITH PROPOFOL;  Surgeon: Pasty Spillers, MD;  Location: [facility] ENDOSCOPY;  Service: Gastroenterology;  Laterality: N/A;   COLONOSCOPY WITH PROPOFOL N/A 02/05/2023   Procedure: COLONOSCOPY WITH PROPOFOL;  Surgeon: Toney Reil, MD;  Location: [facility] ENDOSCOPY;  Service: Gastroenterology;  Laterality: N/A;   LAPAROSCOPIC RETROPUBIC PROSTATECTOMY     2023   LUNG SURGERY     "  ligation of thoracic duct"   PROSTATE BIOPSY N/A 09/21/2020   Procedure: PROSTATE BIOPSY [name];  Surgeon: [name], MD;  Location: [facility] ORS;  Service: Urology;  Laterality: N/A;    SOCIAL HISTORY:   Social History   Tobacco Use   Smoking status: Never    Smokeless tobacco: Never  Substance Use Topics   Alcohol use: No    FAMILY HISTORY:   Family History  Problem Relation Age of Onset   Allergies Sister     DRUG ALLERGIES:   Allergies  Allergen Reactions   Other Anaphylaxis    peanuts   Morphine And Codeine Itching   Tree Extract Other (See Comments)    ALMONDS  ---- EYE/ FACIAL SWELLING   Wound Dressing Adhesive Rash    REVIEW OF SYSTEMS:   ROS As per history of present illness. All pertinent systems were reviewed above. Constitutional, HEENT, cardiovascular, respiratory, GI, GU, musculoskeletal, neuro, psychiatric, endocrine, integumentary and hematologic systems were reviewed and are otherwise negative/unremarkable except for positive findings mentioned above in the HPI.   MEDICATIONS AT HOME:   Prior to Admission medications   Medication Sig Start Date End Date Taking? Authorizing Provider  amLODipine (NORVASC) 10 MG tablet Take 10 mg by mouth daily. 09/10/17  Yes [provider]  APIXABAN Everlene Balls) VTE STARTER PACK (10MG  AND 5MG ) Take as directed on package: start with two-5mg  tablets twice daily for 7 days. On day 8, switch to one-5mg  tablet twice daily. 02/26/23  Yes Earna Coder, MD  atorvastatin (LIPITOR) 10 MG tablet Take 10 mg by mouth daily.   Yes [provider]  JARDIANCE 25 MG TABS tablet Take 25 mg by mouth daily. 12/23/22  Yes [provider]  losartan (COZAAR) 100 MG tablet Take 100 mg by mouth daily. 08/15/17  Yes [provider]  metFORMIN (GLUCOPHAGE) 1000 MG tablet Take 1,000 mg by mouth 2 (two) times daily with a meal.   Yes [provider]  mometasone-formoterol (DULERA) 100-5 MCG/ACT AERO Inhale 2 puffs into the lungs 2 (two) times daily as needed for wheezing or shortness of breath.   Yes [provider]  Omega-3 Fatty Acids (FISH OIL) 1000 MG CAPS Take 1,000 mg by mouth daily.   Yes [provider]  RYBELSUS 7 MG TABS PLEASE SEE  ATTACHED FOR DETAILED DIRECTIONS 12/21/22  Yes [provider]  TOUJEO SOLOSTAR 300 UNIT/ML Solostar Pen SMARTSIG:25 Unit(s) SUB-Q Daily 12/02/22  Yes [provider]  potassium chloride SA (KLOR-CON M20) 20 MEQ tablet Take by mouth. Patient not taking: Reported on 02/24/2023 09/15/20   [provider]  senna-docusate (SENOKOT-S) 8.6-50 MG tablet Take by mouth. Patient not taking: Reported on 02/28/2023 11/16/21   [provider]  sildenafil (VIAGRA) 50 MG tablet Take 50 mg by mouth every 3 (three) days. Patient not taking: Reported on 02/28/2023    [provider]  Vitamin D, Ergocalciferol, (DRISDOL) 50000 units CAPS capsule Take 50,000 Units by mouth every 7 (seven) days.    [provider]      VITAL SIGNS:  Blood pressure 116/67, pulse 91, temperature 98.2 F (36.8 C), temperature source Oral, resp. rate 17, height 5\' 9"  (1.753 m), weight 68 kg, SpO2 100%.  PHYSICAL EXAMINATION:  Physical Exam  GENERAL:  62 y.o.-year-old patient lying in the bed with no acute distress.  EYES: Pupils equal, round, reactive to light and accommodation. No scleral icterus. Extraocular muscles intact.  HEENT: Head atraumatic, normocephalic. Oropharynx and nasopharynx clear.  NECK:  Supple, no jugular venous distention. No thyroid enlargement, no tenderness.  LUNGS: Diminished right basal and midlung zone breath sounds with associated crackles.  No use of accessory muscles of respiration.  CARDIOVASCULAR: Regular rate and rhythm, S1, S2 normal. No murmurs, rubs, or gallops.  ABDOMEN: Soft, nondistended, nontender. Bowel sounds present. No organomegaly or mass.  EXTREMITIES: Diffuse right lower extremity swelling with no pedal edema, cyanosis, or clubbing.  NEUROLOGIC: Cranial nerves II through XII are intact. Muscle strength 5/5 in all extremities. Sensation intact. Gait not checked.  PSYCHIATRIC: The patient is alert and oriented x 3.  Normal affect and good  eye contact. SKIN: No obvious rash, lesion, or ulcer.   LABORATORY PANEL:   CBC Recent Labs  Lab 02/28/23 0517  WBC 18.1*  HGB 10.1*  HCT 31.8*  PLT 324   ------------------------------------------------------------------------------------------------------------------  Chemistries  Recent Labs  Lab 02/24/23 1447 02/27/23 2018 02/28/23 0517  NA 136   < > 135  K 4.9   < > 4.0  CL 100   < > 101  CO2 24   < > 23  GLUCOSE 207*   < > 254*  BUN 15   < > 16  CREATININE 0.76   < > 0.57*  CALCIUM 10.0   < > 9.5  AST 18  --   --   ALT 17  --   --   ALKPHOS 277*  --   --   BILITOT 0.9  --   --    < > = values in this interval not displayed.   ------------------------------------------------------------------------------------------------------------------  Cardiac Enzymes No results for input(s): "TROPONINI" in the last 168 hours. ------------------------------------------------------------------------------------------------------------------  RADIOLOGY:  CT Angio Chest PE W and/or Wo Contrast  Result Date: 02/27/2023 CLINICAL DATA:  Shortness of breath blood clots in leg EXAM: CT ANGIOGRAPHY CHEST WITH CONTRAST TECHNIQUE: Multidetector CT imaging of the chest was performed using the standard protocol during bolus administration of intravenous contrast. Multiplanar CT image reconstructions and MIPs were obtained to evaluate the vascular anatomy. RADIATION DOSE REDUCTION: This exam was performed according to the departmental dose-optimization program which includes automated exposure control, adjustment of the mA and/or kV according to patient size and/or use of iterative reconstruction technique. CONTRAST:  75mL OMNIPAQUE IOHEXOL 350 MG/ML SOLN COMPARISON:  Chest x-ray 02/27/2023 FINDINGS: Cardiovascular: Satisfactory opacification of the pulmonary arteries to the segmental level. Small nonocclusive segmental/subsegmental embolus within the left upper lobe, series 6, image 168. No  other discrete emboli are visualized. Mild atherosclerosis. No aneurysm or dissection. Normal cardiac size. Moderate to large circumferential pericardial effusion measuring up to 2.1 cm, slight increased density values suggesting complex fluid. Mediastinum/Nodes: Midline trachea. No thyroid mass. Subcarinal nodes measuring at least up to 2 cm. Esophagus within normal limits. Small clips in the posterior mediastinum. Lungs/Pleura: Small circumferential right pleural effusion. Calcifications or sutures at the posterior right lung base. Prominent peribronchovascular thickening within the right perihilar region and within the right middle and lower lobe bronchi. Multifocal bilateral ground-glass densities and areas of tree-in-bud density suspicious for respiratory infection Upper Abdomen: Large hypodense right hepatic mass suspected, this measures at least 12.6 cm. Another hypodense inferior right hepatic lobe lesion measuring 2.8 cm on series 4, image 153 incompletely assessed. Musculoskeletal: No acute osseous abnormality. Review of the MIP images confirms the above findings. IMPRESSION: 1. Positive for small nonocclusive segmental/subsegmental pulmonary embolus within the left upper lobe. 2. Moderate to large circumferential pericardial effusion measuring up to 2.1 cm, slight increased density  values suggesting complex fluid. Recommend correlation with echocardiogram 3. Small circumferential right pleural effusion. Prominent peribronchovascular thickening within the right perihilar region and within the right upper, middle and lower lobe bronchi, with mild mucous plugging in the right lower lobe. Multifocal bilateral ground-glass densities and areas of tree-in-bud density suspicious for respiratory infection/pneumonia, possible atypical infection. 4. Subcarinal adenopathy, reactive versus metastatic. 5. Right hepatic masses measuring up to 12.6 cm. Findings are suspicious for metastatic disease versus liver  carcinoma. When the patient is clinically stable and able to follow directions and hold their breath (preferably as an outpatient) further evaluation with dedicated abdominal MRI should be considered. Critical Value/emergent results were called by telephone at the time of interpretation on 02/27/2023 at 11:02 pm to provider [facility] , who verbally acknowledged these results. Electronically Signed   By: Jasmine Pang M.D.   On: 02/27/2023 23:04   DG Chest 2 View  Result Date: 02/27/2023 CLINICAL DATA:  Shortness of breath EXAM: CHEST - 2 VIEW COMPARISON:  08/04/2013 FINDINGS: Left lung is grossly clear. Normal cardiac size. Small loculated pleural effusion versus pleural thickening. Airspace disease at the right middle lobe and right base. No pneumothorax IMPRESSION: Airspace disease at the right middle lobe and right base, possible pneumonia. Small loculated pleural effusion versus pleural thickening. Electronically Signed   By: Jasmine Pang M.D.   On: 02/27/2023 22:45   US Venous Img Lower Bilateral (DVT)  Result Date: 02/26/2023 CLINICAL DATA:  Leg swelling for 2 weeks EXAM: BILATERAL LOWER EXTREMITY VENOUS DOPPLER ULTRASOUND TECHNIQUE: Gray-scale sonography with graded compression, as well as color Doppler and duplex ultrasound were performed to evaluate the lower extremity deep venous systems from the level of the common femoral vein and including the common femoral, femoral, profunda femoral, popliteal and calf veins including the posterior tibial, peroneal and gastrocnemius veins when visible. The superficial great saphenous vein was also interrogated. Spectral Doppler was utilized to evaluate flow at rest and with distal augmentation maneuvers in the common femoral, femoral and popliteal veins. COMPARISON:  None Available. FINDINGS: RIGHT LOWER EXTREMITY Common Femoral Vein: No evidence of thrombus. Normal compressibility, respiratory phasicity and response to augmentation. Saphenofemoral  Junction: No evidence of thrombus. Normal compressibility and flow on color Doppler imaging. Profunda Femoral Vein: No evidence of thrombus. Normal compressibility and flow on color Doppler imaging. Femoral Vein: Poor flow on Doppler. Poor compression with hypoechoic material. Popliteal Vein: Poor flow on Doppler with poor compression and luminal hypoechoic appearance Calf Veins: Areas of thrombus along the posterior tibial and peroneal vein. Superficial Great Saphenous Vein: No evidence of thrombus. Normal compressibility. Venous Reflux:  None. Other Findings:  None. LEFT LOWER EXTREMITY Common Femoral Vein: No evidence of thrombus. Normal compressibility, respiratory phasicity and response to augmentation. Saphenofemoral Junction: No evidence of thrombus. Normal compressibility and flow on color Doppler imaging. Profunda Femoral Vein: No evidence of thrombus. Normal compressibility and flow on color Doppler imaging. Femoral Vein: No evidence of thrombus. Normal compressibility, respiratory phasicity and response to augmentation. Popliteal Vein: Incomplete compression and poor flow on Doppler, color and spectral Calf Veins: Areas of thrombus in the peroneal and posterior tibial veins. Superficial Great Saphenous Vein: No evidence of thrombus. Normal compressibility. Venous Reflux:  None. Other Findings:  None. Critical Value/emergent results were called by telephone at the time of interpretation on 02/26/2023 at 6:04 pm to provider [facility] , who verbally acknowledged these results. IMPRESSION: Bilateral DVT. Right femoral vein down through the calf veins. Left popliteal and calf  veins. Electronically Signed   By: Karen Kays M.D.   On: 02/26/2023 18:14      IMPRESSION AND PLAN:  Assessment and Plan: * Acute pulmonary embolism (HCC) - The patient be admitted to a progressive unit bed. - We will continue him on IV heparin and hold off Eliquis for now. - Will obtain a 2D echo to assess for RV  strain.  Sepsis due to pneumonia [facility]) - This is associated with leukocytosis and tachycardia as well as tachypnea. - Will continue antibiotic therapy with IV Rocephin and Zithromax. - Mucolytic therapy and bronchodilator therapy will be provided. - We will follow blood cultures. - The patient will be hydrated with IV normal saline.  Type 2 diabetes mellitus without complications (HCC) - We will place the patient on supplemental coverage with NovoLog. - We will continue Jardiance. - We will hold off metformin.  Essential hypertension - We will continue antihypertensive therapy.  Asthma, chronic, unspecified asthma severity, with acute exacerbation Will place him on as needed DuoNeb and hold off long-acting beta agonist.  Dyslipidemia - We will continue statin therapy.    DVT prophylaxis: IV heparin. Advanced Care Planning:  Code Status: full code. Family Communication:  The plan of care was discussed in details with the patient (and family). I answered all questions. The patient agreed to proceed with the above mentioned plan. Further management will depend upon hospital course. Disposition Plan: Back to previous home environment Consults called: none. All the records are reviewed and case discussed with ED provider.  Status is: Inpatient    At the time of the admission, it appears that the appropriate admission status for this patient is inpatient.  This is judged to be reasonable and necessary in order to provide the required intensity of service to ensure the patient's safety given the presenting symptoms, physical exam findings and initial radiographic and laboratory data in the context of comorbid conditions.  The patient requires inpatient status due to high intensity of service, high risk of further deterioration and high frequency of surveillance required.  I certify that at the time of admission, it is my clinical judgment that the patient will require inpatient  hospital care extending more than 2 midnights.                            Dispo: The patient is from: Home              Anticipated d/c is to: Home              Patient currently is not medically stable to d/c.              Difficult to place patient: No  Hannah Beat M.D on 02/28/2023 at 7:47 AM  [facility]   From 7 PM-7 AM, contact night-coverage [email or web address]  CC: Primary care physician; Sherrie Mustache, MD

## 2023-02-28 NOTE — Progress Notes (Signed)
PT Cancellation Note  Patient Details Name: Tony Benson MRN: [id number] DOB: [birth year]   Cancelled Treatment:    Reason Eval/Treat Not Completed: Medical issues which prohibited therapy Patient requires 24 hours of anticoagulation prior to PT eval. Will evaluate tomorrow if appropriate.   [name] 02/28/2023, 12:46 PM

## 2023-02-28 NOTE — ED Notes (Signed)
Patient complains of right sided shoulder and neck pain. States that pain medication given last night was helpful. Patient has no pain medications ordered at this time. MD Nelson Chimes notified of patient's pain, and medication regimen used last night via secure chat.

## 2023-02-28 NOTE — Assessment & Plan Note (Addendum)
Acute pulmonary embolism without cor pulmonale, bilateral lower extremity DVT.  Patient was on heparin drip and switched over to Lovenox injections.  Case discussed with Dr. Donneta Romberg oncology and Lovenox injections are pretty expensive for the patient.  Eliquis 10 mg twice a day for 5 more days and then 5 mg twice a day after that.

## 2023-02-28 NOTE — Assessment & Plan Note (Signed)
-   We will place the patient on supplemental coverage with NovoLog. - We will continue Jardiance. - We will hold off metformin.

## 2023-02-28 NOTE — Progress Notes (Signed)
ANTICOAGULATION CONSULT NOTE  Pharmacy Consult for heparin infusion Indication: pulmonary embolus  Allergies  Allergen Reactions   Other Anaphylaxis    peanuts   Morphine And Codeine Itching    Patient Measurements: Height: 5\' 9"  (175.3 cm) Weight: 68 kg (149 lb 14.6 oz) IBW/kg (Calculated) : 70.7 Heparin Dosing Weight: 68 kg  Vital Signs: Temp: 98.2 F (36.8 C) (11/14 2007) Temp Source: Oral (11/14 2007) BP: 106/63 (11/14 2007) Pulse Rate: 117 (11/14 2007)  Labs: Recent Labs    02/27/23 2018  HGB 11.2*  HCT 34.9*  PLT 369  CREATININE 0.78  TROPONINIHS 9    Estimated Creatinine Clearance: 93.3 mL/min (by C-G formula based on SCr of 0.78 mg/dL).   Medical History: Past Medical History:  Diagnosis Date   Asthma    Cancer (HCC)    prostate   Diabetes mellitus without complication (HCC)    type II   Hypertension    Sleep apnea    uses Cpap machine     Medications:  PTA Meds: Apixaban Starter Pack Started 02/26/23, last dose reported 02/27/23 at 1800.  Assessment: Pt is a 62 yo male started on Eliquis 02/26/23, for bilateral lower extremity DVTs,  presenting to ED c/o SOB now " positive for small nonocclusive segmental/subsegmental pulmonary embolus within the left upper lobe."  Goal of Therapy:  Heparin level 0.3-0.7 units/ml aPTT 66-102 seconds Monitor platelets by anticoagulation protocol: Yes   Plan:  No initial bolus Start heparin infusion at 1150 units/hr at 0400 Will follow aPTT until correlation w/ HL confirmed Will check aPTT in 6 hr after start of infusion HL & CBC daily while on heparin  Otelia Sergeant, PharmD, [facility] 02/28/2023 12:51 AM

## 2023-02-28 NOTE — Assessment & Plan Note (Signed)
-  Continue Lipitor °

## 2023-02-28 NOTE — Progress Notes (Signed)
ANTICOAGULATION CONSULT NOTE  Pharmacy Consult for heparin infusion Indication: pulmonary embolus  Allergies  Allergen Reactions   Other Anaphylaxis    peanuts   Morphine And Codeine Itching   Tree Extract Other (See Comments)    ALMONDS  ---- EYE/ FACIAL SWELLING   Wound Dressing Adhesive Rash    Patient Measurements: Height: 5\' 9"  (175.3 cm) Weight: 68 kg (149 lb 14.6 oz) IBW/kg (Calculated) : 70.7 Heparin Dosing Weight: 68 kg  Vital Signs: Temp: 98.5 F (36.9 C) (11/15 1119) Temp Source: Oral (11/15 1119) BP: 136/64 (11/15 1100) Pulse Rate: 115 (11/15 1100)  Labs: Recent Labs    02/27/23 0058 02/27/23 2018 02/28/23 0058 02/28/23 0517 02/28/23 1228  HGB  --  11.2*  --  10.1*  --   HCT  --  34.9*  --  31.8*  --   PLT  --  369  --  324  --   APTT  --   --  74*  --  70*  LABPROT  --   --  27.6*  --   --   INR  --   --  2.5*  --   --   HEPARINUNFRC  --   --  >1.10*  --   --   CREATININE  --  0.78  --  0.57*  --   TROPONINIHS 11 9  --   --   --     Estimated Creatinine Clearance: 93.3 mL/min (A) (by C-G formula based on SCr of 0.57 mg/dL (L)).   Medical History: Past Medical History:  Diagnosis Date   Asthma    Cancer (HCC)    prostate   Diabetes mellitus without complication (HCC)    type II   Hypertension    Sleep apnea    uses Cpap machine     Medications:  PTA Meds: Apixaban Starter Pack Started 02/26/23, last dose reported 02/27/23 at 1800.  Assessment: Pt is a 62 yo male started on Eliquis 02/26/23, for bilateral lower extremity DVTs,  presenting to ED c/o SOB now " positive for small nonocclusive segmental/subsegmental pulmonary embolus within the left upper lobe."  Goal of Therapy:  Heparin level 0.3-0.7 units/ml aPTT 66-102 seconds Monitor platelets by anticoagulation protocol: Yes  Monitoring: 11/15 1228 aPTT 70 sec Therapeutic x 1   Plan: Continue heparin infusion at 1150 units/hr Confirmatory aPTT in 6 hours, continue to titrate  by aPTT until it and antiXa correlate CBC daily while on heparin   Will M. Dareen Piano, PharmD Clinical Pharmacist 02/28/2023 1:10 PM

## 2023-02-28 NOTE — Hospital Course (Addendum)
62 year old with history of asthma, OSA on CPAP, DM2, prostate cancer comes to the ED for worsening dyspnea. Patient was recently diagnosed with right lower extremity DVT and placed on Eliquis. CTA chest shows small nonocclusive left upper lobe PE with moderate to large circumferential pericardial effusion and right-sided pleural effusion with groundglass opacity. Also found to have a right hepatic mass of 12.6 cm. MRI liver eventually showed concern for pancreatic head mass with multiple liver lesions.  Patient had ultrasound-guided liver biopsy on 11/18.  11/20.  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  Hemoglobin dipped down to 8.1.  Check hemoglobin again tomorrow. 11/21.  Patient had a fever overnight of above 102.  Unasyn started switched over to Maxipime 11/22.  Patient wanting to go home today.  No further fever.  Will receive a dose of Maxipime prior to disposition and switch to Levaquin upon discharge.

## 2023-03-01 ENCOUNTER — Inpatient Hospital Stay (HOSPITAL_COMMUNITY)
Admit: 2023-03-01 | Discharge: 2023-03-01 | Disposition: A | Discharge status: Home / Self Care | Payer: BC Managed Care – PPO | Attending: Family Medicine | Admitting: Family Medicine

## 2023-03-01 DIAGNOSIS — I2699 Other pulmonary embolism without acute cor pulmonale: Secondary | ICD-10-CM | POA: Diagnosis not present

## 2023-03-01 DIAGNOSIS — I2609 Other pulmonary embolism with acute cor pulmonale: Secondary | ICD-10-CM

## 2023-03-01 DIAGNOSIS — K8689 Other specified diseases of pancreas: Secondary | ICD-10-CM

## 2023-03-01 LAB — ECHOCARDIOGRAM COMPLETE
AR max vel: 2.46 cm2
AV Peak grad: 12 mm[Hg]
Ao pk vel: 1.73 m/s
Area-P 1/2: 7.22 cm2
S' Lateral: 2.9 cm

## 2023-03-01 LAB — GLUCOSE, CAPILLARY
Glucose-Capillary: 136 mg/dL — ABNORMAL HIGH (ref 70–99)
Glucose-Capillary: 232 mg/dL — ABNORMAL HIGH (ref 70–99)
Glucose-Capillary: 324 mg/dL — ABNORMAL HIGH (ref 70–99)
Glucose-Capillary: 374 mg/dL — ABNORMAL HIGH (ref 70–99)

## 2023-03-01 LAB — APTT
aPTT: 105 s — ABNORMAL HIGH (ref 24–36)
aPTT: 70 s — ABNORMAL HIGH (ref 24–36)
aPTT: 70 s — ABNORMAL HIGH (ref 24–36)

## 2023-03-01 LAB — CBC
HCT: 30.9 % — ABNORMAL LOW (ref 39.0–52.0)
Hemoglobin: 10 g/dL — ABNORMAL LOW (ref 13.0–17.0)
MCH: 27.8 pg (ref 26.0–34.0)
MCHC: 32.4 g/dL (ref 30.0–36.0)
MCV: 85.8 fL (ref 80.0–100.0)
Platelets: 304 10*3/uL (ref 150–400)
RBC: 3.6 MIL/uL — ABNORMAL LOW (ref 4.22–5.81)
RDW: 13.4 % (ref 11.5–15.5)
WBC: 20.1 10*3/uL — ABNORMAL HIGH (ref 4.0–10.5)
nRBC: 0 % (ref 0.0–0.2)

## 2023-03-01 LAB — BASIC METABOLIC PANEL
Anion gap: 12 (ref 5–15)
BUN: 11 mg/dL (ref 8–23)
CO2: 19 mmol/L — ABNORMAL LOW (ref 22–32)
Calcium: 8.9 mg/dL (ref 8.9–10.3)
Chloride: 106 mmol/L (ref 98–111)
Creatinine, Ser: 0.45 mg/dL — ABNORMAL LOW (ref 0.61–1.24)
GFR, Estimated: >60 mL/min (ref >60)
Glucose, Bld: 107 mg/dL — ABNORMAL HIGH (ref 70–99)
Potassium: 3.8 mmol/L (ref 3.5–5.1)
Sodium: 137 mmol/L (ref 135–145)

## 2023-03-01 LAB — MAGNESIUM: Magnesium: 1.9 mg/dL (ref 1.7–2.4)

## 2023-03-01 LAB — HEPARIN LEVEL (UNFRACTIONATED): Heparin Unfractionated: >1.1 [IU]/mL — ABNORMAL HIGH (ref 0.30–0.70)

## 2023-03-01 LAB — AFP TUMOR MARKER: AFP, Serum, Tumor Marker: <1.8 ng/mL (ref 0.0–8.4)

## 2023-03-01 MED ORDER — BOOST / RESOURCE BREEZE PO LIQD CUSTOM
1.0000 | Freq: Three times a day (TID) | ORAL | Status: DC
  Administered 2023-03-01 (×2): 1 via ORAL

## 2023-03-01 MED ORDER — IPRATROPIUM-ALBUTEROL 0.5-2.5 (3) MG/3ML IN SOLN
3.0000 mL | Freq: Three times a day (TID) | RESPIRATORY_TRACT | Status: DC
Start: 2023-03-01 — End: 2023-03-02
  Administered 2023-03-01 – 2023-03-02 (×3): 3 mL via RESPIRATORY_TRACT
  Filled 2023-03-01 (×3): qty 3

## 2023-03-01 MED ORDER — ENSURE ENLIVE PO LIQD
237.0000 mL | Freq: Two times a day (BID) | ORAL | Status: DC
  Administered 2023-03-01 – 2023-03-07 (×11): 237 mL via ORAL

## 2023-03-01 NOTE — Evaluation (Signed)
Occupational Therapy Evaluation Patient Details Name: Tony Benson MRN: [id number] DOB: 05-28-1960 Today's Date: 03/01/2023   History of Present Illness Pt is a 62 yo male that presented to ED for SOB. Noted for BLE DVTs 11/13, workup showed small nonocculsive segmental/subsegmental pulmonary embolus within the left upper lobe. PMH of cancer, asthma, DM, HTN   Clinical Impression   Pt was seen for OT evaluation this date. Prior to hospital admission, pt was independent, active, and working 60 hrs/week in a job involving heavy lifting and being on his feet a lot. Pt lives with his spouse and is eager to return to independence. Pt presents to acute OT demonstrating near baseline independence wiht ADL performance and functional mobility (See OT problem list for additional functional deficits). HR up to 120's with exertion and pt noting mild fatigue with decreased activity. Pt noting feeling more comfortable with light handheld assist during mobility but no overt balance deficits appreciated. Pt citing it due to it being his first time up. No direct assist required for ADL tasks. Do not anticipate need for additional skilled OT services. Encouraged pt to discuss with MD regarding return to work. Will sign off.     If plan is discharge home, recommend the following:      Functional Status Assessment  Patient has had a recent decline in their functional status and demonstrates the ability to make significant improvements in function in a reasonable and predictable amount of time.  Equipment Recommendations  None recommended by OT    Recommendations for Other Services       Precautions / Restrictions Precautions Precautions: None Restrictions Weight Bearing Restrictions: No      Mobility Bed Mobility Overal bed mobility: Modified Independent                  Transfers Overall transfer level: Modified independent                        Balance Overall balance  assessment: Needs assistance Sitting-balance support: No upper extremity supported, Feet supported Sitting balance-Leahy Scale: Normal     Standing balance support: No upper extremity supported, During functional activity, Single extremity supported Standing balance-Leahy Scale: Good                             ADL either performed or assessed with clinical judgement   ADL Overall ADL's : Modified independent                                       General ADL Comments: Near baseline, no assist required during assessment.     Vision         Perception         Praxis         Pertinent Vitals/Pain Pain Assessment Pain Assessment: No/denies pain     Extremity/Trunk Assessment Upper Extremity Assessment Upper Extremity Assessment: Overall WFL for tasks assessed   Lower Extremity Assessment Lower Extremity Assessment: Overall WFL for tasks assessed       Communication Communication Communication: No apparent difficulties   Cognition Arousal: Alert   Overall Cognitive Status: Within Functional Limits for tasks assessed  General Comments  HR in 120's with exertion    Exercises     Shoulder Instructions      Home Living Family/patient expects to be discharged to:: Private residence Living Arrangements: Spouse/significant other Available Help at Discharge: Family Type of Home: House Home Access: Level entry     Home Layout: Two level;Able to live on main level with bedroom/bathroom     Bathroom Shower/Tub: Walk-in shower         Home Equipment: Shower seat - built in          Prior Functioning/Environment Prior Level of Function : Independent/Modified Independent;Working/employed;Driving               ADLs Comments: working full time, active and independent        OT Problem List: Cardiopulmonary status limiting activity;Decreased activity tolerance       OT Treatment/Interventions:      OT Goals(Current goals can be found in the care plan section) Acute Rehab OT Goals Patient Stated Goal: go home OT Goal Formulation: All assessment and education complete, DC therapy  OT Frequency:      Co-evaluation PT/OT/SLP Co-Evaluation/Treatment: Yes Reason for Co-Treatment: To address functional/ADL transfers PT goals addressed during session: Mobility/safety with mobility OT goals addressed during session: ADL's and self-care      AM-PAC OT "6 Clicks" Daily Activity     Outcome Measure Help from another person eating meals?: None Help from another person taking care of personal grooming?: None Help from another person toileting, which includes using toliet, bedpan, or urinal?: None Help from another person bathing (including washing, rinsing, drying)?: None Help from another person to put on and taking off regular upper body clothing?: None Help from another person to put on and taking off regular lower body clothing?: None 6 Click Score: 24   End of Session    Activity Tolerance: Patient tolerated treatment well Patient left: in chair;with call bell/phone within reach;with chair alarm set  OT Visit Diagnosis: Unsteadiness on feet (R26.81)                Time: 5621-3086 OT Time Calculation (min): 14 min Charges:  OT General Charges $OT Visit: 1 Visit OT Evaluation $OT Eval Low Complexity: 1 Low  Arman Filter., MPH, MS, OTR/L ascom [phone] 03/01/23, 11:17 AM

## 2023-03-01 NOTE — Consult Note (Signed)
[facility] CONSULT NOTE  Patient Care Team: Sherrie Mustache, MD as PCP - General (Internal Medicine) Luretha Murphy, MD as Consulting Physician (General Surgery) Earna Coder, MD as Consulting Physician (Oncology)  CHIEF COMPLAINTS/PURPOSE OF CONSULTATION:  Pancreatic/liver mass  HISTORY OF PRESENTING ILLNESS:  Tony Benson 62 y.o.  male pleasant patient with a history of diabetes; and a prior history of prostate cancer-presented to the emergency room for acute onset of worsening dyspnea.  Patient was recently seen in the clinic-for significant weight loss.  Post evaluation in the clinic patient had ultrasound of the lower extremities that showed bilateral DVT.  Patient started on Eliquis about 2 days ago.  Patient was at his daughter's wedding over the weekend-and was noted to have worsening shortness of breath for which she was recommended to go to the emergency room.   On evaluation in the emergency room with a CAT scan noted to have small nonocclusive segmental subsegmental pulmonary embolism.  And also right lower middle lobe/multifocal groundglass opacities concerning for pneumonia.  CT scan also showed right hepatic masses and also a pancreatic mass.  Also noted to have moderate to large pericardial effusion on the CT scan.  Patient currently on IV heparin and antibiotics.  Patient admitted to hospital for further workup/management.  Review of Systems  Constitutional:  Positive for malaise/fatigue and weight loss. Negative for chills, diaphoresis and fever.  HENT:  Negative for nosebleeds and sore throat.   Eyes:  Negative for double vision.  Respiratory:  Positive for shortness of breath. Negative for cough, hemoptysis, sputum production and wheezing.   Cardiovascular:  Negative for chest pain, palpitations, orthopnea and leg swelling.  Gastrointestinal:  Positive for abdominal pain and nausea. Negative for blood in stool, constipation, diarrhea, heartburn,  melena and vomiting.  Genitourinary:  Negative for dysuria, frequency and urgency.  Musculoskeletal:  Negative for back pain and joint pain.  Skin: Negative.  Negative for itching and rash.  Neurological:  Positive for weakness. Negative for dizziness, tingling, focal weakness and headaches.  Endo/Heme/Allergies:  Does not bruise/bleed easily.  Psychiatric/Behavioral:  Negative for depression. The patient is not nervous/anxious and does not have insomnia.     MEDICAL HISTORY:  Past Medical History:  Diagnosis Date   Asthma    Cancer (HCC)    prostate   Diabetes mellitus without complication (HCC)    type II   Hypertension    Sleep apnea    uses Cpap machine     SURGICAL HISTORY: Past Surgical History:  Procedure Laterality Date   CHOLECYSTECTOMY     COLONOSCOPY WITH PROPOFOL N/A 11/18/2018   Procedure: COLONOSCOPY WITH PROPOFOL;  Surgeon: Pasty Spillers, MD;  Location: [facility] ENDOSCOPY;  Service: Gastroenterology;  Laterality: N/A;   COLONOSCOPY WITH PROPOFOL N/A 02/05/2023   Procedure: COLONOSCOPY WITH PROPOFOL;  Surgeon: Toney Reil, MD;  Location: [facility] ENDOSCOPY;  Service: Gastroenterology;  Laterality: N/A;   LAPAROSCOPIC RETROPUBIC PROSTATECTOMY     2023   LUNG SURGERY     "ligation of thoracic duct"   PROSTATE BIOPSY N/A 09/21/2020   Procedure: PROSTATE BIOPSY [name];  Surgeon: [name], MD;  Location: [facility] ORS;  Service: Urology;  Laterality: N/A;    SOCIAL HISTORY: Social History   Socioeconomic History   Marital status: Married    Spouse name: Not on file   Number of children: Not on file   Years of education: Not on file   Highest education level: Not on file  Occupational  History   Occupation: Automotive engineer   Tobacco Use   Smoking status: Never   Smokeless tobacco: Never  Vaping Use   Vaping status: Never Used  Substance and Sexual Activity   Alcohol use: No   Drug use: No   Sexual activity: Not on file  Other Topics Concern    Not on file  Social History Narrative   Not on file   Social Determinants of Health   Financial Resource Strain: Not on file  Food Insecurity: No Food Insecurity (02/28/2023)   Hunger Vital Sign    Worried About Running Out of Food in the Last Year: Never true    Ran Out of Food in the Last Year: Never true  Transportation Needs: No Transportation Needs (02/28/2023)   PRAPARE - Administrator, Civil Service (Medical): No    Lack of Transportation (Non-Medical): No  Physical Activity: Not on file  Stress: Not on file  Social Connections: Not on file  Intimate Partner Violence: Not At Risk (02/28/2023)   Humiliation, Afraid, Rape, and Kick questionnaire    Fear of Current or Ex-Partner: No    Emotionally Abused: No    Physically Abused: No    Sexually Abused: No    FAMILY HISTORY: Family History  Problem Relation Age of Onset   Allergies Sister     ALLERGIES:  is allergic to other, morphine and codeine, tree extract, and wound dressing adhesive.  MEDICATIONS:  Current Facility-Administered Medications  Medication Dose Route Frequency Provider Last Rate Last Admin   acetaminophen (TYLENOL) tablet 650 mg  650 mg Oral Q6H PRN Mansy, Jan A, MD       Or   acetaminophen (TYLENOL) suppository 650 mg  650 mg Rectal Q6H PRN Mansy, Jan A, MD       amLODipine (NORVASC) tablet 10 mg  10 mg Oral Daily Mansy, Jan A, MD   10 mg at 03/01/23 0834   atorvastatin (LIPITOR) tablet 10 mg  10 mg Oral Daily Mansy, Jan A, MD   10 mg at 03/01/23 0834   azithromycin (ZITHROMAX) 500 mg in dextrose 5 % 250 mL IVPB  500 mg Intravenous Q0600 Mansy, Jan A, MD   Stopped at 03/01/23 1000   cefTRIAXone (ROCEPHIN) 2 g in sodium chloride 0.9 % 100 mL IVPB  2 g Intravenous Q0600 Mansy, Jan A, MD   Stopped at 03/01/23 0800   chlorpheniramine-HYDROcodone (TUSSIONEX) 10-8 MG/5ML suspension 5 mL  5 mL Oral Q12H PRN Mansy, Jan A, MD       empagliflozin (JARDIANCE) tablet 25 mg  25 mg Oral Daily Mansy,  Jan A, MD   25 mg at 03/01/23 2956   feeding supplement (BOOST / RESOURCE BREEZE) liquid 1 Container  1 Container Oral TID BM Amin, Ankit C, MD   1 Container at 03/01/23 1227   feeding supplement (ENSURE ENLIVE / ENSURE PLUS) liquid 237 mL  237 mL Oral BID BM Manuela Schwartz, NP   237 mL at 03/01/23 1227   guaiFENesin (MUCINEX) 12 hr tablet 600 mg  600 mg Oral BID Mansy, Jan A, MD   600 mg at 03/01/23 0834   heparin ADULT infusion 100 units/mL (25000 units/27mL)  1,100 Units/hr Intravenous Continuous Otelia Sergeant, RPH 11 mL/hr at 03/01/23 1502 1,100 Units/hr at 03/01/23 1502   hydrALAZINE (APRESOLINE) injection 10 mg  10 mg Intravenous Q4H PRN Amin, Ankit C, MD       insulin aspart (novoLOG) injection 0-5 Units  0-5 Units  Subcutaneous QHS Mansy, Jan A, MD       insulin aspart (novoLOG) injection 0-9 Units  0-9 Units Subcutaneous TID WC Mansy, Jan A, MD   7 Units at 03/01/23 1807   insulin glargine-yfgn [facility]) injection 25 Units  25 Units Subcutaneous Daily Mansy, Jan A, MD   25 Units at 03/01/23 1115   ipratropium-albuterol (DUONEB) 0.5-2.5 (3) MG/3ML nebulizer solution 3 mL  3 mL Nebulization Q4H PRN Amin, Ankit C, MD       ipratropium-albuterol (DUONEB) 0.5-2.5 (3) MG/3ML nebulizer solution 3 mL  3 mL Nebulization TID Amin, Ankit C, MD   3 mL at 03/01/23 1455   ketorolac (TORADOL) 30 MG/ML injection 30 mg  30 mg Intravenous Q8H PRN Amin, Ankit C, MD   30 mg at 03/01/23 1619   losartan (COZAAR) tablet 100 mg  100 mg Oral Daily Mansy, Jan A, MD   100 mg at 03/01/23 0834   magnesium hydroxide (MILK OF MAGNESIA) suspension 30 mL  30 mL Oral Daily PRN Mansy, Jan A, MD       metoprolol tartrate (LOPRESSOR) injection 5 mg  5 mg Intravenous Q4H PRN Amin, Ankit C, MD       omega-3 acid ethyl esters (LOVAZA) capsule 1,000 mg  1,000 mg Oral Daily Mansy, Jan A, MD   1,000 mg at 03/01/23 0834   ondansetron (ZOFRAN) tablet 4 mg  4 mg Oral Q6H PRN Mansy, Jan A, MD       Or   ondansetron [facility])  injection 4 mg  4 mg Intravenous Q6H PRN Mansy, Jan A, MD       senna-docusate (Senokot-S) tablet 1 tablet  1 tablet Oral QHS PRN Amin, Ankit C, MD       traZODone (DESYREL) tablet 25 mg  25 mg Oral QHS PRN Mansy, Jan A, MD       [START ON 03/03/2023] Vitamin D (Ergocalciferol) (DRISDOL) 1.25 MG (50000 UNIT) capsule 50,000 Units  50,000 Units Oral Q7 days Mansy, Jan A, MD        PHYSICAL EXAMINATION:   Vitals:   03/01/23 1457 03/01/23 1711  BP:  123/63  Pulse:  (!) 115  Resp:  16  Temp:  97.8 F (36.6 C)  SpO2: 92% 97%   Filed Weights   02/27/23 2008  Weight: 149 lb 14.6 oz (68 kg)    Physical Exam Vitals and nursing note reviewed.  HENT:     Head: Normocephalic and atraumatic.     Mouth/Throat:     Pharynx: Oropharynx is clear.  Eyes:     Extraocular Movements: Extraocular movements intact.     Pupils: Pupils are equal, round, and reactive to light.  Cardiovascular:     Rate and Rhythm: Normal rate and regular rhythm.  Pulmonary:     Comments: Decreased breath sounds bilaterally.  Abdominal:     Palpations: Abdomen is soft.  Musculoskeletal:        General: Normal range of motion.     Cervical back: Normal range of motion.  Skin:    General: Skin is warm.  Neurological:     General: No focal deficit present.     Mental Status: He is alert and oriented to person, place, and time.  Psychiatric:        Behavior: Behavior normal.        Judgment: Judgment normal.     LABORATORY DATA:  I have reviewed the data as listed Lab Results  Component Value Date  WBC 20.1 (H) 03/01/2023   HGB 10.0 (L) 03/01/2023   HCT 30.9 (L) 03/01/2023   MCV 85.8 03/01/2023   PLT 304 03/01/2023   Recent Labs    02/24/23 1447 02/27/23 2018 02/28/23 0517 03/01/23 0603  NA 136 132* 135 137  K 4.9 4.2 4.0 3.8  CL 100 101 101 106  CO2 24 23 23  19*  GLUCOSE 207* 301* 254* 107*  BUN 15 17 16 11   CREATININE 0.76 0.78 0.57* 0.45*  CALCIUM 10.0 9.5 9.5 8.9  GFRNONAA >60 >60 >60  >60  PROT 7.1  --   --   --   ALBUMIN 2.7*  --   --   --   AST 18  --   --   --   ALT 17  --   --   --   ALKPHOS 277*  --   --   --   BILITOT 0.9  --   --   --     RADIOGRAPHIC STUDIES: I have personally reviewed the radiological images as listed and agreed with the findings in the report. ECHOCARDIOGRAM COMPLETE  Result Date: 03/01/2023    ECHOCARDIOGRAM REPORT   Patient Name:   Tony Benson Date of Exam: 03/01/2023 Medical Rec #:  [id number]     Height:       69.0 in Accession #:    [id number]    Weight:       149.9 lb Date of Birth:  1960/11/07    BSA:          1.828 m Patient Age:    61 years      BP:           138/75 mmHg Patient Gender: M             HR:           116 bpm. Exam Location:  [facility] Procedure: 2D Echo Indications:     Pulmonary Embolus I26.09  History:         Patient has no prior history of Echocardiogram examinations.  Sonographer:     Overton Mam RDCS, FASE Referring Phys:  [id number] Vernetta Honey MANSY Diagnosing Phys: Julien Nordmann MD IMPRESSIONS  1. Left ventricular ejection fraction, by estimation, is 60 to 65%. The left ventricle has normal function. The left ventricle has no regional wall motion abnormalities. Left ventricular diastolic parameters are indeterminate.  2. Right ventricular systolic function is normal. The right ventricular size is normal. There is moderately elevated pulmonary artery systolic pressure. The estimated right ventricular systolic pressure is 54.0 mmHg.  3. Large pericardial effusion. The pericardial effusion is circumferential. There is no evidence of cardiac tamponade. 1.95 off the RV free wall, 2.16 cm off the apical region, 1.70 cm off the LV free wall. IVC is not dilated at 1.16 cm  4. The mitral valve is normal in structure. No evidence of mitral valve regurgitation. No evidence of mitral stenosis.  5. The aortic valve is tricuspid. Aortic valve regurgitation is not visualized. No aortic stenosis is present.  6. The inferior vena cava is normal in  size with greater than 50% respiratory variability, suggesting right atrial pressure of 3 mmHg. FINDINGS  Left Ventricle: Left ventricular ejection fraction, by estimation, is 60 to 65%. The left ventricle has normal function. The left ventricle has no regional wall motion abnormalities. The left ventricular internal cavity size was normal in size. There is  no left ventricular hypertrophy. Left ventricular diastolic parameters are indeterminate.  Right Ventricle: The right ventricular size is normal. No increase in right ventricular wall thickness. Right ventricular systolic function is normal. There is moderately elevated pulmonary artery systolic pressure. The tricuspid regurgitant velocity is 3.50 m/s, and with an assumed right atrial pressure of 5 mmHg, the estimated right ventricular systolic pressure is 54.0 mmHg. Left Atrium: Left atrial size was normal in size. Right Atrium: Right atrial size was normal in size. Pericardium: A large pericardial effusion is present. The pericardial effusion is circumferential. There is no evidence of cardiac tamponade. Mitral Valve: The mitral valve is normal in structure. No evidence of mitral valve regurgitation. No evidence of mitral valve stenosis. Tricuspid Valve: The tricuspid valve is normal in structure. Tricuspid valve regurgitation is mild . No evidence of tricuspid stenosis. Aortic Valve: The aortic valve is tricuspid. Aortic valve regurgitation is not visualized. No aortic stenosis is present. Aortic valve peak gradient measures 12.0 mmHg. Pulmonic Valve: The pulmonic valve was normal in structure. Pulmonic valve regurgitation is not visualized. No evidence of pulmonic stenosis. Aorta: The aortic root is normal in size and structure. Venous: The inferior vena cava is normal in size with greater than 50% respiratory variability, suggesting right atrial pressure of 3 mmHg. IAS/Shunts: No atrial level shunt detected by color flow Doppler.  LEFT VENTRICLE PLAX 2D  LVIDd:         4.60 cm   Diastology LVIDs:         2.90 cm   LV e' medial:    11.70 cm/s LV PW:         1.00 cm   LV E/e' medial:  8.8 LV IVS:        1.00 cm   LV e' lateral:   18.60 cm/s LVOT diam:     2.10 cm   LV E/e' lateral: 5.5 LV SV:         67 LV SV Index:   37 LVOT Area:     3.46 cm  RIGHT VENTRICLE RV Basal diam:  2.50 cm RV S prime:     22.30 cm/s TAPSE (M-mode): 1.8 cm LEFT ATRIUM             Index        RIGHT ATRIUM          Index LA diam:        2.70 cm 1.48 cm/m   RA Area:     7.05 cm LA Vol (A2C):   45.8 ml 25.06 ml/m  RA Volume:   9.68 ml  5.30 ml/m LA Vol (A4C):   25.5 ml 13.95 ml/m LA Biplane Vol: 35.4 ml 19.37 ml/m  AORTIC VALVE                 PULMONIC VALVE AV Area (Vmax): 2.46 cm     PV Vmax:        1.10 m/s AV Vmax:        173.00 cm/s  PV Peak grad:   4.8 mmHg AV Peak Grad:   12.0 mmHg    RVOT Peak grad: 4 mmHg LVOT Vmax:      123.00 cm/s LVOT Vmean:     79.500 cm/s LVOT VTI:       0.193 m  AORTA                        PULMONARY ARTERY Ao Root diam: 3.50 cm        MPA diam:  3.00 cm Ao Asc diam:  2.70 cm MITRAL VALVE                TRICUSPID VALVE MV Area (PHT): 7.22 cm     TR Peak grad:   49.0 mmHg MV Decel Time: 105 msec     TR Vmax:        350.00 cm/s MV E velocity: 103.00 cm/s MV A velocity: 87.50 cm/s   SHUNTS MV E/A ratio:  1.18         Systemic VTI:  0.19 m                             Systemic Diam: 2.10 cm Julien Nordmann MD Electronically signed by Julien Nordmann MD Signature Date/Time: 03/01/2023/3:02:54 PM    Final    MR LIVER W WO CONTRAST  Result Date: 03/01/2023 CLINICAL DATA:  62 year old male with history of shortness of breath recently diagnosed with blood clots in the legs. Hepatic masses identified on recent chest CTA. Further evaluation to exclude the possibility of malignancy. EXAM: MRI ABDOMEN WITHOUT AND WITH CONTRAST TECHNIQUE: Multiplanar multisequence MR imaging of the abdomen was performed both before and after the administration of intravenous  contrast. CONTRAST:  7mL GADAVIST GADOBUTROL 1 MMOL/ML IV SOLN COMPARISON:  No prior abdominal MRI.  Chest CTA 02/27/2023. FINDINGS: Comment: Portions of today's examination are limited by considerable patient respiratory motion. Lower chest: Small right pleural effusion with areas of atelectasis and/or scarring in the right lung base. Hepatobiliary: There are multiple hepatic lesions highly concerning for widespread metastatic disease to the liver. The largest of these is a dominant lesion in the central aspect of the liver, predominantly centered in segment 8 (axial image 15 of series 4 and coronal image 22 series 3) measuring up to 13.7 x 9.6 x 12.1 cm. These lesions are heterogeneous in signal intensity but generally T1 hypointense, slightly T2 hyperintense, hypovascular with predominantly peripheral enhancement which increases slightly on delayed images, and extensive internal diffusion restriction. No definite hypervascular hepatic lesion is identified. Gallbladder is not visualized, likely surgically absent. No intra or extrahepatic biliary ductal dilatation. Pancreas: In the region of the pancreatic head (best appreciated on axial image 55 of series 14 and coronal image 45 of series 20) there is what appears to be a poorly defined hypovascular mass estimated to measure approximately 4.0 x 2.6 x 2.9 cm, which is located immediately anterior and lateral to the superior mesenteric vein adjacent to the splenic pulmonary confluence, and inferior to the proximal portal vein, with minimal obscuration of the intervening fat plane adjacent to the superior mesenteric vein. This lesion appears to obstruct the main pancreatic duct (axial image 49 of series 14), proximal to which the main pancreatic duct is severely dilated measuring up to 12 mm in the body of the pancreas. Diffuse atrophy throughout the body and tail of the pancreas is noted. 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 Spleen:  Unremarkable. Adrenals/Urinary Tract: Multiple T1 hypointense, T2 hyperintense, nonenhancing lesions in the kidneys are compatible with simple cysts (Bosniak class 1), which require no imaging follow-up, largest of which measure up to approximately 2.2 cm in diameter in the lower pole of the left kidney. No aggressive appearing renal  lesions. No hydroureteronephrosis in the visualized portions of the abdomen. Bilateral adrenal glands are normal in appearance. Stomach/Bowel: Visualized portions are unremarkable. Vascular/Lymphatic: No aneurysm identified in the visualized abdominal vasculature. No definite lymphadenopathy confidently identified in the abdomen. Other:  Trace volume of perihepatic ascites. Musculoskeletal: No aggressive appearing osseous lesions are noted in the visualized portions of the skeleton. IMPRESSION: 1. Large hypovascular mass in the region of the head of the pancreas causing occlusion of the main pancreatic duct, highly concerning for primary pancreatic neoplasm. Numerous hypovascular lesions scattered throughout the liver, presumably reflective of widespread metastatic disease to the liver, with the largest lesion a dominant mass centered in the right lobe of the liver measuring up to 13.7 x 9.6 x 12.1 cm. 2. Trace volume of perihepatic ascites. 3. Fluid collection anterior to the mass adjacent to the head of the pancreas, presumably a proteinaceous/hemorrhagic pancreatic pseudocyst. 4. Small right pleural effusion. 5. Additional incidental findings, as above. Electronically Signed   By: Trudie Reed M.D.   On: 03/01/2023 05:35   CT Angio Chest PE W and/or Wo Contrast  Result Date: 02/27/2023 CLINICAL DATA:  Shortness of breath blood clots  in leg EXAM: CT ANGIOGRAPHY CHEST WITH CONTRAST TECHNIQUE: Multidetector CT imaging of the chest was performed using the standard protocol during bolus administration of intravenous contrast. Multiplanar CT image reconstructions and MIPs were obtained to evaluate the vascular anatomy. RADIATION DOSE REDUCTION: This exam was performed according to the departmental dose-optimization program which includes automated exposure control, adjustment of the mA and/or kV according to patient size and/or use of iterative reconstruction technique. CONTRAST:  75mL OMNIPAQUE IOHEXOL 350 MG/ML SOLN COMPARISON:  Chest x-ray 02/27/2023 FINDINGS: Cardiovascular: Satisfactory opacification of the pulmonary arteries to the segmental level. Small nonocclusive segmental/subsegmental embolus within the left upper lobe, series 6, image 168. No other discrete emboli are visualized. Mild atherosclerosis. No aneurysm or dissection. Normal cardiac size. Moderate to large circumferential pericardial effusion measuring up to 2.1 cm, slight increased density values suggesting complex fluid. Mediastinum/Nodes: Midline trachea. No thyroid mass. Subcarinal nodes measuring at least up to 2 cm. Esophagus within normal limits. Small clips in the posterior mediastinum. Lungs/Pleura: Small circumferential right pleural effusion. Calcifications or sutures at the posterior right lung base. Prominent peribronchovascular thickening within the right perihilar region and within the right middle and lower lobe bronchi. Multifocal bilateral ground-glass densities and areas of tree-in-bud density suspicious for respiratory infection Upper Abdomen: Large hypodense right hepatic mass suspected, this measures at least 12.6 cm. Another hypodense inferior right hepatic lobe lesion measuring 2.8 cm on series 4, image 153 incompletely assessed. Musculoskeletal: No acute osseous abnormality. Review of the MIP images confirms the above findings. IMPRESSION: 1. Positive  for small nonocclusive segmental/subsegmental pulmonary embolus within the left upper lobe. 2. Moderate to large circumferential pericardial effusion measuring up to 2.1 cm, slight increased density values suggesting complex fluid. Recommend correlation with echocardiogram 3. Small circumferential right pleural effusion. Prominent peribronchovascular thickening within the right perihilar region and within the right upper, middle and lower lobe bronchi, with mild mucous plugging in the right lower lobe. Multifocal bilateral ground-glass densities and areas of tree-in-bud density suspicious for respiratory infection/pneumonia, possible atypical infection. 4. Subcarinal adenopathy, reactive versus metastatic. 5. Right hepatic masses measuring up to 12.6 cm. Findings are suspicious for metastatic disease versus liver carcinoma. When the patient is clinically stable and able to follow directions and hold their breath (preferably as an outpatient) further evaluation with dedicated abdominal MRI should be considered.  Critical Value/emergent results were called by telephone at the time of interpretation on 02/27/2023 at 11:02 pm to provider [facility] , who verbally acknowledged these results. Electronically Signed   By: Jasmine Pang M.D.   On: 02/27/2023 23:04   DG Chest 2 View  Result Date: 02/27/2023 CLINICAL DATA:  Shortness of breath EXAM: CHEST - 2 VIEW COMPARISON:  08/04/2013 FINDINGS: Left lung is grossly clear. Normal cardiac size. Small loculated pleural effusion versus pleural thickening. Airspace disease at the right middle lobe and right base. No pneumothorax IMPRESSION: Airspace disease at the right middle lobe and right base, possible pneumonia. Small loculated pleural effusion versus pleural thickening. Electronically Signed   By: Jasmine Pang M.D.   On: 02/27/2023 22:45   US Venous Img Lower Bilateral (DVT)  Result Date: 02/26/2023 CLINICAL DATA:  Leg swelling for 2 weeks EXAM: BILATERAL  LOWER EXTREMITY VENOUS DOPPLER ULTRASOUND TECHNIQUE: Gray-scale sonography with graded compression, as well as color Doppler and duplex ultrasound were performed to evaluate the lower extremity deep venous systems from the level of the common femoral vein and including the common femoral, femoral, profunda femoral, popliteal and calf veins including the posterior tibial, peroneal and gastrocnemius veins when visible. The superficial great saphenous vein was also interrogated. Spectral Doppler was utilized to evaluate flow at rest and with distal augmentation maneuvers in the common femoral, femoral and popliteal veins. COMPARISON:  None Available. FINDINGS: RIGHT LOWER EXTREMITY Common Femoral Vein: No evidence of thrombus. Normal compressibility, respiratory phasicity and response to augmentation. Saphenofemoral Junction: No evidence of thrombus. Normal compressibility and flow on color Doppler imaging. Profunda Femoral Vein: No evidence of thrombus. Normal compressibility and flow on color Doppler imaging. Femoral Vein: Poor flow on Doppler. Poor compression with hypoechoic material. Popliteal Vein: Poor flow on Doppler with poor compression and luminal hypoechoic appearance Calf Veins: Areas of thrombus along the posterior tibial and peroneal vein. Superficial Great Saphenous Vein: No evidence of thrombus. Normal compressibility. Venous Reflux:  None. Other Findings:  None. LEFT LOWER EXTREMITY Common Femoral Vein: No evidence of thrombus. Normal compressibility, respiratory phasicity and response to augmentation. Saphenofemoral Junction: No evidence of thrombus. Normal compressibility and flow on color Doppler imaging. Profunda Femoral Vein: No evidence of thrombus. Normal compressibility and flow on color Doppler imaging. Femoral Vein: No evidence of thrombus. Normal compressibility, respiratory phasicity and response to augmentation. Popliteal Vein: Incomplete compression and poor flow on Doppler, color and  spectral Calf Veins: Areas of thrombus in the peroneal and posterior tibial veins. Superficial Great Saphenous Vein: No evidence of thrombus. Normal compressibility. Venous Reflux:  None. Other Findings:  None. Critical Value/emergent results were called by telephone at the time of interpretation on 02/26/2023 at 6:04 pm to provider [facility] , who verbally acknowledged these results. IMPRESSION: Bilateral DVT. Right femoral vein down through the calf veins. Left popliteal and calf veins. Electronically Signed   By: Karen Kays M.D.   On: 02/26/2023 18:14     62 y.o. male patient with a history of diabetes currently admitted to hospital for worsening shortness of breath/acute PE.  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    # Acute PE/bilateral DVT-on IV heparin  # Suspected pneumonia based on imaging-on IV antibiotics.  # Large pericardial effusion noted-no evidence of tamponade. [On echo/CT scan]  PLAN/RECOMMENDATIONS:  # Will plan to proceed with ultrasound-guided biopsy of the liver lesion-with IR.  # Awaiting  tumor markers.   # Continue IV heparin-postbiopsy recommend transitioning to Lovenox 1 mg/kg twice daily at discharge.   Thank you Dr. Nelson Chimes allowing me to participate in the care of your pleasant patient. Please do not hesitate to contact me with questions or concerns in the interim.  # I reviewed the blood work- with the patient in detail; also reviewed the imaging independently [as summarized above]; and with the patient in detail.  My contact information was given to the patient; an outpatient follow-up will be arranged post discharge.  I also spoke to patient's daughter Joice Lofts [nurse at [facility]] regarding above concerning findings and the plan of care.    Earna Coder, MD 03/01/2023 7:24 PM

## 2023-03-01 NOTE — Plan of Care (Signed)
  Problem: Education: Goal: Ability to describe self-care measures that may prevent or decrease complications (Diabetes Survival Skills Education) will improve Outcome: Progressing   Problem: Coping: Goal: Ability to adjust to condition or change in health will improve Outcome: Progressing   Problem: Health Behavior/Discharge Planning: Goal: Ability to identify and utilize available resources and services will improve Outcome: Progressing   Problem: Skin Integrity: Goal: Risk for impaired skin integrity will decrease Outcome: Progressing   

## 2023-03-01 NOTE — Evaluation (Signed)
Physical Therapy Evaluation Patient Details Name: Tony Benson MRN: [id number] DOB: 07-31-1960 Today's Date: 03/01/2023  History of Present Illness  Pt is a 62 yo male that presented to ED for SOB. Noted for BLE DVTs 11/13, workup showed small nonocculsive segmental/subsegmental pulmonary embolus within the left upper lobe. PMH of cancer, asthma, DM, HTN.   Clinical Impression  Pt A&Ox4, denied pain but reference RLE swelling is still present. Pt on room air throughout mobility, spO2 >90%. At baseline the pt reported he is independent, works full time. He was able to perform bed mobility and transfers modI (extra time, use of hands). He ambulated ~282ft with handheld assist. He was able to ambulate without it, but reported improved comfort with assist due to generalized fatigue, some very mild SOB also observed. Returned to room with needs in reach. The patient demonstrated and reported near return to baseline level of functioning, no further acute PT needs indicated. PT to sign off. Please reconsult PT if pt status changes or acute needs are identified.          If plan is discharge home, recommend the following: Other (comment) (NA)   Can travel by private vehicle        Equipment Recommendations None recommended by PT  Recommendations for Other Services       Functional Status Assessment Patient has not had a recent decline in their functional status     Precautions / Restrictions Precautions Precautions: None Precaution Comments: heparin infusion Restrictions Weight Bearing Restrictions: No      Mobility  Bed Mobility Overal bed mobility: Modified Independent                  Transfers Overall transfer level: Modified independent                 General transfer comment: extra time, use of hands    Ambulation/Gait Ambulation/Gait assistance: Contact guard assist Gait Distance (Feet): 250 Feet Assistive device: 1 person hand held assist          General Gait Details: pt able to ambulate without SUE support but reported improved comfort via handheld assist. no LOB or gait deviations noted, pt reported generalized fatigue  Stairs            Wheelchair Mobility     Tilt Bed    Modified Rankin (Stroke Patients Only)       Balance Overall balance assessment: Needs assistance Sitting-balance support: No upper extremity supported, Feet supported Sitting balance-Leahy Scale: Normal     Standing balance support: No upper extremity supported, During functional activity, Single extremity supported Standing balance-Leahy Scale: Good                               Pertinent Vitals/Pain Pain Assessment Pain Assessment: No/denies pain    Home Living Family/patient expects to be discharged to:: Private residence Living Arrangements: Spouse/significant other Available Help at Discharge: Family Type of Home: House Home Access: Level entry       Home Layout: Two level;Able to live on main level with bedroom/bathroom Home Equipment: Shower seat - built in      Prior Function Prior Level of Function : Independent/Modified Independent;Working/employed;Driving               ADLs Comments: working full time, active and independent     Extremity/Trunk Assessment   Upper Extremity Assessment Upper Extremity Assessment: Overall WFL for tasks assessed  Lower Extremity Assessment Lower Extremity Assessment: Overall WFL for tasks assessed       Communication   Communication Communication: No apparent difficulties  Cognition Arousal: Alert   Overall Cognitive Status: Within Functional Limits for tasks assessed                                          General Comments General comments (skin integrity, edema, etc.): HR in 120's with exertion    Exercises     Assessment/Plan    PT Assessment Patient does not need any further PT services  PT Problem List         PT  Treatment Interventions      PT Goals (Current goals can be found in the Care Plan section)       Frequency       Co-evaluation   Reason for Co-Treatment: To address functional/ADL transfers PT goals addressed during session: Mobility/safety with mobility OT goals addressed during session: ADL's and self-care       AM-PAC PT "6 Clicks" Mobility  Outcome Measure Help needed turning from your back to your side while in a flat bed without using bedrails?: None Help needed moving from lying on your back to sitting on the side of a flat bed without using bedrails?: None Help needed moving to and from a bed to a chair (including a wheelchair)?: None Help needed standing up from a chair using your arms (e.g., wheelchair or bedside chair)?: None Help needed to walk in hospital room?: None Help needed climbing 3-5 steps with a railing? : None 6 Click Score: 24    End of Session   Activity Tolerance: Patient tolerated treatment well Patient left: in chair;with call bell/phone within reach;with chair alarm set Nurse Communication: Mobility status PT Visit Diagnosis: Other abnormalities of gait and mobility (R26.89)    Time: 8295-6213 PT Time Calculation (min) (ACUTE ONLY): 13 min   Charges:   PT Evaluation $PT Eval Low Complexity: 1 Low   PT General Charges $$ ACUTE PT VISIT: 1 Visit        Olga Coaster PT, DPT 12:10 PM,03/01/23

## 2023-03-01 NOTE — Progress Notes (Signed)
ANTICOAGULATION CONSULT NOTE  Pharmacy Consult for heparin infusion Indication: pulmonary embolus  Allergies  Allergen Reactions   Other Anaphylaxis    peanuts   Morphine And Codeine Itching   Tree Extract Other (See Comments)    ALMONDS  ---- EYE/ FACIAL SWELLING   Wound Dressing Adhesive Rash    Patient Measurements: Height: 5\' 9"  (175.3 cm) Weight: 68 kg (149 lb 14.6 oz) IBW/kg (Calculated) : 70.7 Heparin Dosing Weight: 68 kg  Vital Signs: Temp: 98.2 F (36.8 C) (11/16 0000) Temp Source: Oral (11/16 0000) BP: 124/70 (11/16 0000) Pulse Rate: 111 (11/16 0000)  Labs: Recent Labs    02/27/23 0058 02/27/23 2018 02/27/23 2018 02/28/23 0058 02/28/23 0517 02/28/23 1228 02/28/23 1917 03/01/23 0603  HGB  --  11.2*   < >  --  10.1*  --   --  10.0*  HCT  --  34.9*  --   --  31.8*  --   --  30.9*  PLT  --  369  --   --  324  --   --  304  APTT  --   --    < > 74*  --  70* 71* 105*  LABPROT  --   --   --  27.6*  --   --   --   --   INR  --   --   --  2.5*  --   --   --   --   HEPARINUNFRC  --   --   --  >1.10*  --   --   --  >1.10*  CREATININE  --  0.78  --   --  0.57*  --   --  0.45*  TROPONINIHS 11 9  --   --   --   --   --   --    < > = values in this interval not displayed.    Estimated Creatinine Clearance: 93.3 mL/min (A) (by C-G formula based on SCr of 0.45 mg/dL (L)).   Medical History: Past Medical History:  Diagnosis Date   Asthma    Cancer (HCC)    prostate   Diabetes mellitus without complication (HCC)    type II   Hypertension    Sleep apnea    uses Cpap machine     Medications:  PTA Meds: Apixaban Starter Pack Started 02/26/23, last dose reported 02/27/23 at 1800.  Assessment: Pt is a 62 yo male started on Eliquis 02/26/23, for bilateral lower extremity DVTs,  presenting to ED c/o SOB now " positive for small nonocclusive segmental/subsegmental pulmonary embolus within the left upper lobe."  Goal of Therapy:  Heparin level 0.3-0.7  units/ml aPTT 66-102 seconds Monitor platelets by anticoagulation protocol: Yes  Monitoring: 11/15 1228 aPTT 70 sec Therapeutic x 1 11/15 1917 aPTT 71 Therapeutic x 2 11/16 0603 aPTT 105, supratherapeutic / HL >1.1, not correlating   Plan: Decrease heparin infusion to 1100 units/hr Recheck aPTT in 6 hrs after rate change Continue to titrate by aPTT until it and antiXa correlate CBC daily while on heparin   Bari Mantis PharmD Clinical Pharmacist 03/01/2023

## 2023-03-01 NOTE — Plan of Care (Signed)
  Problem: Education: Goal: Ability to describe self-care measures that may prevent or decrease complications (Diabetes Survival Skills Education) will improve Outcome: Progressing Goal: Individualized Educational Video(s) Outcome: Progressing   Problem: Coping: Goal: Ability to adjust to condition or change in health will improve Outcome: Progressing   Problem: Fluid Volume: Goal: Ability to maintain a balanced intake and output will improve Outcome: Progressing   Problem: Health Behavior/Discharge Planning: Goal: Ability to identify and utilize available resources and services will improve Outcome: Progressing Goal: Ability to manage health-related needs will improve Outcome: Progressing   Problem: Metabolic: Goal: Ability to maintain appropriate glucose levels will improve Outcome: Progressing   Problem: Nutritional: Goal: Maintenance of adequate nutrition will improve Outcome: Progressing Goal: Progress toward achieving an optimal weight will improve Outcome: Progressing   Problem: Skin Integrity: Goal: Risk for impaired skin integrity will decrease Outcome: Progressing   Problem: Tissue Perfusion: Goal: Adequacy of tissue perfusion will improve Outcome: Progressing   Problem: Education: Goal: Knowledge of General Education information will improve Description: Including pain rating scale, medication(s)/side effects and non-pharmacologic comfort measures Outcome: Progressing   Problem: Health Behavior/Discharge Planning: Goal: Ability to manage health-related needs will improve Outcome: Progressing   Problem: Clinical Measurements: Goal: Ability to maintain clinical measurements within normal limits will improve Outcome: Progressing Goal: Will remain free from infection Outcome: Progressing Goal: Diagnostic test results will improve Outcome: Progressing Goal: Respiratory complications will improve Outcome: Progressing Goal: Cardiovascular complication will  be avoided Outcome: Progressing   Problem: Activity: Goal: Risk for activity intolerance will decrease Outcome: Progressing   Problem: Nutrition: Goal: Adequate nutrition will be maintained Outcome: Progressing   Problem: Coping: Goal: Level of anxiety will decrease Outcome: Progressing   Problem: Elimination: Goal: Will not experience complications related to bowel motility Outcome: Progressing Goal: Will not experience complications related to urinary retention Outcome: Progressing   Problem: Pain Management: Goal: General experience of comfort will improve Outcome: Progressing   Problem: Safety: Goal: Ability to remain free from injury will improve Outcome: Progressing   Problem: Skin Integrity: Goal: Risk for impaired skin integrity will decrease Outcome: Progressing   Problem: Activity: Goal: Ability to tolerate increased activity will improve Outcome: Progressing   Problem: Clinical Measurements: Goal: Ability to maintain a body temperature in the normal range will improve Outcome: Progressing   Problem: Respiratory: Goal: Ability to maintain adequate ventilation will improve Outcome: Progressing Goal: Ability to maintain a clear airway will improve Outcome: Progressing

## 2023-03-01 NOTE — Progress Notes (Signed)
PROGRESS NOTE    Tony Benson  URK:[id number] DOB: Dec 16, 1960 DOA: 02/28/2023 PCP: Sherrie Mustache, MD     Brief Narrative:  62 year old with history of asthma, OSA on CPAP, DM2, prostate cancer comes to the ED for worsening dyspnea.  Patient was recently diagnosed with right lower extremity DVT and placed on Eliquis.  Now CTA chest shows small nonocclusive left upper lobe PE with moderate to large circumferential pericardial effusion and right-sided pleural effusion with groundglass opacity.  Also found to have a right hepatic mass of 12.6 cm.  MRI liver eventually showed concern for pancreatic head mass with multiple liver lesions.  Discussed with oncology, ultrasound-guided biopsy ordered   Assessment & Plan:  Principal Problem:   Acute pulmonary embolism (HCC) Active Problems:   Sepsis due to pneumonia (HCC)   Type 2 diabetes mellitus without complications (HCC)   Essential hypertension   Dyslipidemia   Asthma, chronic, unspecified asthma severity, with acute exacerbation   * Acute pulmonary embolism (HCC) without cor pulmonale Mod-Large Pericardial and Small rightpleural effusion Bilateral lower extremity DVTs Currently patient is on heparin drip.  Eventually will transition to Eliquis.  Echocardiogram has been ordered.  Lower extremity Dopplers on 11/13 already showed bilateral lower extremity DVTs.   Sepsis due to pneumonia [facility]) Multifocal groundglass opacity in the lungs Possible concerns of sepsis.  Will check BNP, procalcitonin.  Continue azithromycin and Rocephin.  Pancreatic mass with multiple liver lesions - MRI performed which shows pancreatic head mass with multiple liver lesions.  This is highly concerning for aggressive malignancy.  Will check CA 19-9, Discussed with Dr Donneta Romberg. Ordered US Liver Bx.    Type 2 diabetes mellitus without complications (HCC) Sliding scale and Accu-Chek.  On Jardiance, holding metformin.     Essential hypertension On Norvasc,  losartan.  IV as needed   Chronic asthma Bronchodilators   Dyslipidemia Lipitor  History of prostate cancer status post prostatectomy - Follows oncology/urology with [facility]  PT/OT  DVT prophylaxis: Hep Drip Code Status: Full  Family Communication: Spouse at bedside Status is: Inpatient Multiple ongoing issues.  Continue hospital stay.    Subjective: Seen and examined at bedside.  Does not have any complaints today.  After my discussion with him regarding above findings.  He clearly states that he wants to undergo all the possible treatments as necessary.   Examination:  General exam: Appears calm and comfortable  Respiratory system: Clear to auscultation. Respiratory effort normal. Cardiovascular system: S1 & S2 heard, RRR. No JVD, murmurs, rubs, gallops or clicks. No pedal edema. Gastrointestinal system: Abdomen is nondistended, soft and nontender. No organomegaly or masses felt. Normal bowel sounds heard. Central nervous system: Alert and oriented. No focal neurological deficits. Extremities: Symmetric 5 x 5 power. Skin: No rashes, lesions or ulcers Psychiatry: Judgement and insight appear normal. Mood & affect appropriate.             Diet Orders (From admission, onward)     Start     Ordered   03/02/23 0001  Diet NPO time specified  Diet effective midnight        03/01/23 1237   02/28/23 1006  Diet Carb Modified Fluid consistency: Thin; Room service appropriate? Yes  Diet effective now       Question Answer Comment  Diet-HS Snack? Nothing   Calorie Level Medium 1600-2000   Fluid consistency: Thin   Room service appropriate? Yes      02/28/23 1005  Objective: Vitals:   03/01/23 0600 03/01/23 0738 03/01/23 0755 03/01/23 1138  BP: 138/75 128/63  127/67  Pulse: (!) 108 (!) 113  (!) 111  Resp:  16  17  Temp: 98.1 F (36.7 C) 98.5 F (36.9 C)  98.3 F (36.8 C)  TempSrc: Oral     SpO2: 93% 96% 94% 95%  Weight:      Height:         Intake/Output Summary (Last 24 hours) at 03/01/2023 1335 Last data filed at 03/01/2023 1154 Gross per 24 hour  Intake 1772.72 ml  Output 1600 ml  Net 172.72 ml   Filed Weights   02/27/23 2008  Weight: 68 kg    Scheduled Meds:  amLODipine  10 mg Oral Daily   atorvastatin  10 mg Oral Daily   empagliflozin  25 mg Oral Daily   feeding supplement  1 Container Oral TID BM   feeding supplement  237 mL Oral BID BM   guaiFENesin  600 mg Oral BID   insulin aspart  0-5 Units Subcutaneous QHS   insulin aspart  0-9 Units Subcutaneous TID WC   insulin glargine-yfgn  25 Units Subcutaneous Daily   ipratropium-albuterol  3 mL Nebulization TID   losartan  100 mg Oral Daily   omega-3 acid ethyl esters  1,000 mg Oral Daily   [START ON 03/03/2023] Vitamin D (Ergocalciferol)  50,000 Units Oral Q7 days   Continuous Infusions:  azithromycin 500 mg (03/01/23 0831)   cefTRIAXone (ROCEPHIN)  IV 2 g (03/01/23 0625)   heparin 1,100 Units/hr (03/01/23 4696)    Nutritional status     Body mass index is 22.14 kg/m.  Data Reviewed:   CBC: Recent Labs  Lab 02/24/23 1447 02/27/23 2018 02/28/23 0517 03/01/23 0603  WBC 19.0* 17.7* 18.1* 20.1*  NEUTROABS 16.5*  --   --   --   HGB 11.4* 11.2* 10.1* 10.0*  HCT 37.2* 34.9* 31.8* 30.9*  MCV 88.8 85.7 90.6 85.8  PLT 366 369 324 304   Basic Metabolic Panel: Recent Labs  Lab 02/24/23 1447 02/27/23 2018 02/28/23 0517 03/01/23 0603  NA 136 132* 135 137  K 4.9 4.2 4.0 3.8  CL 100 101 101 106  CO2 24 23 23  19*  GLUCOSE 207* 301* 254* 107*  BUN 15 17 16 11   CREATININE 0.76 0.78 0.57* 0.45*  CALCIUM 10.0 9.5 9.5 8.9  MG  --   --   --  1.9   GFR: Estimated Creatinine Clearance: 93.3 mL/min (A) (by C-G formula based on SCr of 0.45 mg/dL (L)). Liver Function Tests: Recent Labs  Lab 02/24/23 1447  AST 18  ALT 17  ALKPHOS 277*  BILITOT 0.9  PROT 7.1  ALBUMIN 2.7*   No results for input(s): "LIPASE", "AMYLASE" in the last 168  hours. No results for input(s): "AMMONIA" in the last 168 hours. Coagulation Profile: Recent Labs  Lab 02/28/23 0058  INR 2.5*   Cardiac Enzymes: No results for input(s): "CKTOTAL", "CKMB", "CKMBINDEX", "TROPONINI" in the last 168 hours. BNP (last 3 results) No results for input(s): "PROBNP" in the last 8760 hours. HbA1C: Recent Labs    02/28/23 0516  HGBA1C 10.3*   CBG: Recent Labs  Lab 02/28/23 0819 02/28/23 1122 02/28/23 1633 03/01/23 0740 03/01/23 1140  GLUCAP 190* 198* 173* 136* 232*   Lipid Profile: No results for input(s): "CHOL", "HDL", "LDLCALC", "TRIG", "CHOLHDL", "LDLDIRECT" in the last 72 hours. Thyroid Function Tests: No results for input(s): "TSH", "T4TOTAL", "FREET4", "T3FREE", "THYROIDAB"  in the last 72 hours. Anemia Panel: No results for input(s): "VITAMINB12", "FOLATE", "FERRITIN", "TIBC", "IRON", "RETICCTPCT" in the last 72 hours. Sepsis Labs: Recent Labs  Lab 02/28/23 0256 02/28/23 0516  PROCALCITON  --  2.30  LATICACIDVEN 1.1 1.0    Recent Results (from the past 240 hour(s))  Resp panel by RT-PCR (RSV, Flu A&B, Covid) Anterior Nasal Swab     Status: None   Collection Time: 02/28/23 12:58 AM   Specimen: Anterior Nasal Swab  Result Value Ref Range Status   SARS Coronavirus 2 by RT PCR NEGATIVE NEGATIVE Final    Comment: (NOTE) SARS-CoV-2 target nucleic acids are NOT DETECTED.  The SARS-CoV-2 RNA is generally detectable in upper respiratory specimens during the acute phase of infection. The lowest concentration of SARS-CoV-2 viral copies this assay can detect is 138 copies/mL. A negative result does not preclude SARS-Cov-2 infection and should not be used as the sole basis for treatment or other patient management decisions. A negative result may occur with  improper specimen collection/handling, submission of specimen other than nasopharyngeal swab, presence of viral mutation(s) within the areas targeted by this assay, and inadequate  number of viral copies(<138 copies/mL). A negative result must be combined with clinical observations, patient history, and epidemiological information. The expected result is Negative.  Fact Sheet for Patients:  BloggerCourse.com  Fact Sheet for Healthcare Providers:  SeriousBroker.it  This test is no t yet approved or cleared by the [place] FDA and  has been authorized for detection and/or diagnosis of SARS-CoV-2 by FDA under an Emergency Use Authorization (EUA). This EUA will remain  in effect (meaning this test can be used) for the duration of the COVID-19 declaration under Section 564(b)(1) of the Act, 21 U.S.C.section 360bbb-3(b)(1), unless the authorization is terminated  or revoked sooner.       Influenza A by PCR NEGATIVE NEGATIVE Final   Influenza B by PCR NEGATIVE NEGATIVE Final    Comment: (NOTE) The Xpert Xpress SARS-CoV-2/FLU/RSV plus assay is intended as an aid in the diagnosis of influenza from Nasopharyngeal swab specimens and should not be used as a sole basis for treatment. Nasal washings and aspirates are unacceptable for Xpert Xpress SARS-CoV-2/FLU/RSV testing.  Fact Sheet for Patients: BloggerCourse.com  Fact Sheet for Healthcare Providers: SeriousBroker.it  This test is not yet approved or cleared by the [place] FDA and has been authorized for detection and/or diagnosis of SARS-CoV-2 by FDA under an Emergency Use Authorization (EUA). This EUA will remain in effect (meaning this test can be used) for the duration of the COVID-19 declaration under Section 564(b)(1) of the Act, 21 U.S.C. section 360bbb-3(b)(1), unless the authorization is terminated or revoked.     Resp Syncytial Virus by PCR NEGATIVE NEGATIVE Final    Comment: (NOTE) Fact Sheet for Patients: BloggerCourse.com  Fact Sheet for Healthcare  Providers: SeriousBroker.it  This test is not yet approved or cleared by the [place] FDA and has been authorized for detection and/or diagnosis of SARS-CoV-2 by FDA under an Emergency Use Authorization (EUA). This EUA will remain in effect (meaning this test can be used) for the duration of the COVID-19 declaration under Section 564(b)(1) of the Act, 21 U.S.C. section 360bbb-3(b)(1), unless the authorization is terminated or revoked.  Performed at [facility], [address] [place], [place] [address]   Culture, blood (routine x 2)     Status: None (Preliminary result)   Collection Time: 02/28/23  2:56 AM   Specimen: BLOOD  Result Value  Ref Range Status   Specimen Description BLOOD BLOOD LEFT ARM  Final   Special Requests   Final    BOTTLES DRAWN AEROBIC AND ANAEROBIC Blood Culture adequate volume   Culture   Final    NO GROWTH 1 DAY Performed at [facility], [address] [place], [place] [address]    Report Status PENDING  Incomplete  Culture, blood (routine x 2)     Status: None (Preliminary result)   Collection Time: 02/28/23  3:01 AM   Specimen: BLOOD  Result Value Ref Range Status   Specimen Description BLOOD BLOOD RIGHT ARM  Final   Special Requests   Final    BOTTLES DRAWN AEROBIC AND ANAEROBIC Blood Culture adequate volume   Culture   Final    NO GROWTH 1 DAY Performed at [facility], [address] [place], [place] [address]    Report Status PENDING  Incomplete         Radiology Studies: MR LIVER W WO CONTRAST  Result Date: 03/01/2023 CLINICAL DATA:  62 year old male with history of shortness of breath recently diagnosed with blood clots in the legs. Hepatic masses identified on recent chest CTA. Further evaluation to exclude the possibility of malignancy. EXAM: MRI ABDOMEN WITHOUT AND WITH CONTRAST TECHNIQUE: Multiplanar multisequence MR imaging of the abdomen was performed both before and  after the administration of intravenous contrast. CONTRAST:  7mL GADAVIST GADOBUTROL 1 MMOL/ML IV SOLN COMPARISON:  No prior abdominal MRI.  Chest CTA 02/27/2023. FINDINGS: Comment: Portions of today's examination are limited by considerable patient respiratory motion. Lower chest: Small right pleural effusion with areas of atelectasis and/or scarring in the right lung base. Hepatobiliary: There are multiple hepatic lesions highly concerning for widespread metastatic disease to the liver. The largest of these is a dominant lesion in the central aspect of the liver, predominantly centered in segment 8 (axial image 15 of series 4 and coronal image 22 series 3) measuring up to 13.7 x 9.6 x 12.1 cm. These lesions are heterogeneous in signal intensity but generally T1 hypointense, slightly T2 hyperintense, hypovascular with predominantly peripheral enhancement which increases slightly on delayed images, and extensive internal diffusion restriction. No definite hypervascular hepatic lesion is identified. Gallbladder is not visualized, likely surgically absent. No intra or extrahepatic biliary ductal dilatation. Pancreas: In the region of the pancreatic head (best appreciated on axial image 55 of series 14 and coronal image 45 of series 20) there is what appears to be a poorly defined hypovascular mass estimated to measure approximately 4.0 x 2.6 x 2.9 cm, which is located immediately anterior and lateral to the superior mesenteric vein adjacent to the splenic pulmonary confluence, and inferior to the proximal portal vein, with minimal obscuration of the intervening fat plane adjacent to the superior mesenteric vein. This lesion appears to obstruct the main pancreatic duct (axial image 49 of series 14), proximal to which the main pancreatic duct is severely dilated measuring up to 12 mm in the body of the pancreas. Diffuse atrophy throughout the body and tail of the pancreas is noted. Immediately anterior to this mass  (axial image 57 of series 13) there is an irregular-shaped fluid collection which is high T1 signal intensity and high T2 signal intensity, presumably containing proteinaceous/hemorrhagic contents, estimated to measure approximately 3.6 x 2.7 cm, likely a pancreatic pseudocyst. Spleen:  Unremarkable. Adrenals/Urinary Tract: Multiple T1 hypointense, T2 hyperintense, nonenhancing lesions in the kidneys are compatible with simple cysts (Bosniak class 1), which require no imaging follow-up,  largest of which measure up to approximately 2.2 cm in diameter in the lower pole of the left kidney. No aggressive appearing renal lesions. No hydroureteronephrosis in the visualized portions of the abdomen. Bilateral adrenal glands are normal in appearance. Stomach/Bowel: Visualized portions are unremarkable. Vascular/Lymphatic: No aneurysm identified in the visualized abdominal vasculature. No definite lymphadenopathy confidently identified in the abdomen. Other:  Trace volume of perihepatic ascites. Musculoskeletal: No aggressive appearing osseous lesions are noted in the visualized portions of the skeleton. IMPRESSION: 1. Large hypovascular mass in the region of the head of the pancreas causing occlusion of the main pancreatic duct, highly concerning for primary pancreatic neoplasm. Numerous hypovascular lesions scattered throughout the liver, presumably reflective of widespread metastatic disease to the liver, with the largest lesion a dominant mass centered in the right lobe of the liver measuring up to 13.7 x 9.6 x 12.1 cm. 2. Trace volume of perihepatic ascites. 3. Fluid collection anterior to the mass adjacent to the head of the pancreas, presumably a proteinaceous/hemorrhagic pancreatic pseudocyst. 4. Small right pleural effusion. 5. Additional incidental findings, as above. Electronically Signed   By: Trudie Reed M.D.   On: 03/01/2023 05:35   CT Angio Chest PE W and/or Wo Contrast  Result Date:  02/27/2023 CLINICAL DATA:  Shortness of breath blood clots in leg EXAM: CT ANGIOGRAPHY CHEST WITH CONTRAST TECHNIQUE: Multidetector CT imaging of the chest was performed using the standard protocol during bolus administration of intravenous contrast. Multiplanar CT image reconstructions and MIPs were obtained to evaluate the vascular anatomy. RADIATION DOSE REDUCTION: This exam was performed according to the departmental dose-optimization program which includes automated exposure control, adjustment of the mA and/or kV according to patient size and/or use of iterative reconstruction technique. CONTRAST:  75mL OMNIPAQUE IOHEXOL 350 MG/ML SOLN COMPARISON:  Chest x-ray 02/27/2023 FINDINGS: Cardiovascular: Satisfactory opacification of the pulmonary arteries to the segmental level. Small nonocclusive segmental/subsegmental embolus within the left upper lobe, series 6, image 168. No other discrete emboli are visualized. Mild atherosclerosis. No aneurysm or dissection. Normal cardiac size. Moderate to large circumferential pericardial effusion measuring up to 2.1 cm, slight increased density values suggesting complex fluid. Mediastinum/Nodes: Midline trachea. No thyroid mass. Subcarinal nodes measuring at least up to 2 cm. Esophagus within normal limits. Small clips in the posterior mediastinum. Lungs/Pleura: Small circumferential right pleural effusion. Calcifications or sutures at the posterior right lung base. Prominent peribronchovascular thickening within the right perihilar region and within the right middle and lower lobe bronchi. Multifocal bilateral ground-glass densities and areas of tree-in-bud density suspicious for respiratory infection Upper Abdomen: Large hypodense right hepatic mass suspected, this measures at least 12.6 cm. Another hypodense inferior right hepatic lobe lesion measuring 2.8 cm on series 4, image 153 incompletely assessed. Musculoskeletal: No acute osseous abnormality. Review of the MIP  images confirms the above findings. IMPRESSION: 1. Positive for small nonocclusive segmental/subsegmental pulmonary embolus within the left upper lobe. 2. Moderate to large circumferential pericardial effusion measuring up to 2.1 cm, slight increased density values suggesting complex fluid. Recommend correlation with echocardiogram 3. Small circumferential right pleural effusion. Prominent peribronchovascular thickening within the right perihilar region and within the right upper, middle and lower lobe bronchi, with mild mucous plugging in the right lower lobe. Multifocal bilateral ground-glass densities and areas of tree-in-bud density suspicious for respiratory infection/pneumonia, possible atypical infection. 4. Subcarinal adenopathy, reactive versus metastatic. 5. Right hepatic masses measuring up to 12.6 cm. Findings are suspicious for metastatic disease versus liver carcinoma. When the patient is clinically  stable and able to follow directions and hold their breath (preferably as an outpatient) further evaluation with dedicated abdominal MRI should be considered. Critical Value/emergent results were called by telephone at the time of interpretation on 02/27/2023 at 11:02 pm to provider [facility] , who verbally acknowledged these results. Electronically Signed   By: Jasmine Pang M.D.   On: 02/27/2023 23:04   DG Chest 2 View  Result Date: 02/27/2023 CLINICAL DATA:  Shortness of breath EXAM: CHEST - 2 VIEW COMPARISON:  08/04/2013 FINDINGS: Left lung is grossly clear. Normal cardiac size. Small loculated pleural effusion versus pleural thickening. Airspace disease at the right middle lobe and right base. No pneumothorax IMPRESSION: Airspace disease at the right middle lobe and right base, possible pneumonia. Small loculated pleural effusion versus pleural thickening. Electronically Signed   By: Jasmine Pang M.D.   On: 02/27/2023 22:45           LOS: 1 day   Time spent= 35  mins    Miguel Rota, MD [facility]  If 7PM-7AM, please contact night-coverage  03/01/2023, 1:35 PM

## 2023-03-01 NOTE — Progress Notes (Signed)
ANTICOAGULATION CONSULT NOTE  Pharmacy Consult for heparin infusion Indication: pulmonary embolus  Allergies  Allergen Reactions   Other Anaphylaxis    peanuts   Morphine And Codeine Itching   Tree Extract Other (See Comments)    ALMONDS  ---- EYE/ FACIAL SWELLING   Wound Dressing Adhesive Rash    Patient Measurements: Height: 5\' 9"  (175.3 cm) Weight: 68 kg (149 lb 14.6 oz) IBW/kg (Calculated) : 70.7 Heparin Dosing Weight: 68 kg  Vital Signs: Temp: 98.3 F (36.8 C) (11/16 1138) Temp Source: Oral (11/16 0600) BP: 127/67 (11/16 1138) Pulse Rate: 111 (11/16 1138)  Labs: Recent Labs    02/27/23 0058 02/27/23 2018 02/27/23 2018 02/28/23 0058 02/28/23 0517 02/28/23 1228 02/28/23 1917 03/01/23 0603 03/01/23 1305  HGB  --  11.2*   < >  --  10.1*  --   --  10.0*  --   HCT  --  34.9*  --   --  31.8*  --   --  30.9*  --   PLT  --  369  --   --  324  --   --  304  --   APTT  --   --   --  74*  --    < > 71* 105* 70*  LABPROT  --   --   --  27.6*  --   --   --   --   --   INR  --   --   --  2.5*  --   --   --   --   --   HEPARINUNFRC  --   --   --  >1.10*  --   --   --  >1.10*  --   CREATININE  --  0.78  --   --  0.57*  --   --  0.45*  --   TROPONINIHS 11 9  --   --   --   --   --   --   --    < > = values in this interval not displayed.    Estimated Creatinine Clearance: 93.3 mL/min (A) (by C-G formula based on SCr of 0.45 mg/dL (L)).   Medical History: Past Medical History:  Diagnosis Date   Asthma    Cancer (HCC)    prostate   Diabetes mellitus without complication (HCC)    type II   Hypertension    Sleep apnea    uses Cpap machine     Medications:  PTA Meds: Apixaban Starter Pack Started 02/26/23, last dose reported 02/27/23 at 1800.  Assessment: Pt is a 62 yo male started on Eliquis 02/26/23, for bilateral lower extremity DVTs,  presenting to ED c/o SOB now " positive for small nonocclusive segmental/subsegmental pulmonary embolus within the left upper  lobe." Last dose of apixaban on 11/14 per Med Rec  Goal of Therapy:  Heparin level 0.3-0.7 units/ml aPTT 66-102 seconds Monitor platelets by anticoagulation protocol: Yes  Monitoring: 11/15 1228 aPTT 70 sec Therapeutic x 1 11/15 1917 aPTT 71 Therapeutic x 2 11/16 0603 aPTT 105, supratherapeutic / HL >1.1, not correlating 11/16 1305 aPTT 70 Therapeutic   Plan: Continue heparin infusion at 1100 units/hr Check confirmatory aPTT at 2000 Check HL with am labs Continue to titrate by aPTT until correlation with antiXa (HL) CBC daily while on heparin   Bari Mantis PharmD Clinical Pharmacist 03/01/2023

## 2023-03-01 NOTE — Progress Notes (Signed)
  Echocardiogram 2D Echocardiogram has been performed.  ALEM FINE 03/01/2023, 11:15 AM

## 2023-03-01 NOTE — Progress Notes (Signed)
ANTICOAGULATION CONSULT NOTE  Pharmacy Consult for heparin infusion Indication: pulmonary embolus  Allergies  Allergen Reactions   Other Anaphylaxis    peanuts   Morphine And Codeine Itching   Tree Extract Other (See Comments)    ALMONDS  ---- EYE/ FACIAL SWELLING   Wound Dressing Adhesive Rash    Patient Measurements: Height: 5\' 9"  (175.3 cm) Weight: 68 kg (149 lb 14.6 oz) IBW/kg (Calculated) : 70.7 Heparin Dosing Weight: 68 kg  Vital Signs: Temp: 99.1 F (37.3 C) (11/16 2007) Temp Source: Oral (11/16 2007) BP: 129/56 (11/16 2007) Pulse Rate: 120 (11/16 2007)  Labs: Recent Labs    02/27/23 0058 02/27/23 2018 02/27/23 2018 02/28/23 0058 02/28/23 0517 02/28/23 1228 03/01/23 0603 03/01/23 1305 03/01/23 1943  HGB  --  11.2*   < >  --  10.1*  --  10.0*  --   --   HCT  --  34.9*  --   --  31.8*  --  30.9*  --   --   PLT  --  369  --   --  324  --  304  --   --   APTT  --   --   --  74*  --    < > 105* 70* 70*  LABPROT  --   --   --  27.6*  --   --   --   --   --   INR  --   --   --  2.5*  --   --   --   --   --   HEPARINUNFRC  --   --   --  >1.10*  --   --  >1.10*  --   --   CREATININE  --  0.78  --   --  0.57*  --  0.45*  --   --   TROPONINIHS 11 9  --   --   --   --   --   --   --    < > = values in this interval not displayed.    Estimated Creatinine Clearance: 93.3 mL/min (A) (by C-G formula based on SCr of 0.45 mg/dL (L)).   Medical History: Past Medical History:  Diagnosis Date   Asthma    Cancer (HCC)    prostate   Diabetes mellitus without complication (HCC)    type II   Hypertension    Sleep apnea    uses Cpap machine     Medications:  PTA Meds: Apixaban Starter Pack Started 02/26/23, last dose reported 02/27/23 at 1800.  Assessment: Pt is a 62 yo male started on Eliquis 02/26/23, for bilateral lower extremity DVTs,  presenting to ED c/o SOB now " positive for small nonocclusive segmental/subsegmental pulmonary embolus within the left upper  lobe." Last dose of apixaban on 11/14 per Med Rec  Goal of Therapy:  Heparin level 0.3-0.7 units/ml aPTT 66-102 seconds Monitor platelets by anticoagulation protocol: Yes  Monitoring: 11/15 1228 aPTT 70 sec Therapeutic x 1 11/15 1917 aPTT 71 Therapeutic x 2 11/16 0603 aPTT 105, supratherapeutic / HL >1.1, not correlating 11/16 1305 aPTT 70 Therapeutic 11/16 1943 aPTT 70 Therapeutic x2   Plan: Continue heparin infusion at 1100 units/hr Check aPTT/HL with am labs Continue to titrate by aPTT until correlation with antiXa (HL) CBC daily while on heparin  Thank you for involving pharmacy in this patient's care.   Rockwell Alexandria, PharmD Clinical Pharmacist 03/01/2023 9:28 PM

## 2023-03-02 DIAGNOSIS — I2699 Other pulmonary embolism without acute cor pulmonale: Secondary | ICD-10-CM | POA: Diagnosis not present

## 2023-03-02 LAB — BASIC METABOLIC PANEL
Anion gap: 8 (ref 5–15)
BUN: 13 mg/dL (ref 8–23)
CO2: 22 mmol/L (ref 22–32)
Calcium: 8.3 mg/dL — ABNORMAL LOW (ref 8.9–10.3)
Chloride: 105 mmol/L (ref 98–111)
Creatinine, Ser: 0.64 mg/dL (ref 0.61–1.24)
GFR, Estimated: >60 mL/min (ref >60)
Glucose, Bld: 315 mg/dL — ABNORMAL HIGH (ref 70–99)
Potassium: 3.8 mmol/L (ref 3.5–5.1)
Sodium: 135 mmol/L (ref 135–145)

## 2023-03-02 LAB — HEPARIN LEVEL (UNFRACTIONATED): Heparin Unfractionated: 0.67 [IU]/mL (ref 0.30–0.70)

## 2023-03-02 LAB — CBC
HCT: 28.3 % — ABNORMAL LOW (ref 39.0–52.0)
Hemoglobin: 9 g/dL — ABNORMAL LOW (ref 13.0–17.0)
MCH: 27.3 pg (ref 26.0–34.0)
MCHC: 31.8 g/dL (ref 30.0–36.0)
MCV: 85.8 fL (ref 80.0–100.0)
Platelets: 335 10*3/uL (ref 150–400)
RBC: 3.3 MIL/uL — ABNORMAL LOW (ref 4.22–5.81)
RDW: 13.5 % (ref 11.5–15.5)
WBC: 19.1 10*3/uL — ABNORMAL HIGH (ref 4.0–10.5)
nRBC: 0 % (ref 0.0–0.2)

## 2023-03-02 LAB — GLUCOSE, CAPILLARY
Glucose-Capillary: 228 mg/dL — ABNORMAL HIGH (ref 70–99)
Glucose-Capillary: 154 mg/dL — ABNORMAL HIGH (ref 70–99)
Glucose-Capillary: 197 mg/dL — ABNORMAL HIGH (ref 70–99)
Glucose-Capillary: 186 mg/dL — ABNORMAL HIGH (ref 70–99)

## 2023-03-02 LAB — APTT: aPTT: 75 s — ABNORMAL HIGH (ref 24–36)

## 2023-03-02 LAB — MAGNESIUM: Magnesium: 1.9 mg/dL (ref 1.7–2.4)

## 2023-03-02 MED ORDER — INSULIN GLARGINE-YFGN 100 UNIT/ML ~~LOC~~ SOLN
30.0000 [IU] | Freq: Every day | SUBCUTANEOUS | Status: DC
  Administered 2023-03-02 – 2023-03-06 (×4): 30 [IU] via SUBCUTANEOUS
  Filled 2023-03-02 (×6): qty 0.3

## 2023-03-02 MED ORDER — HYDROMORPHONE HCL 2 MG PO TABS
2.0000 mg | ORAL_TABLET | ORAL | Status: DC | PRN
  Administered 2023-03-02 – 2023-03-07 (×13): 2 mg via ORAL
  Filled 2023-03-02 (×14): qty 1

## 2023-03-02 MED ORDER — IPRATROPIUM BROMIDE 0.02 % IN SOLN
0.5000 mg | Freq: Two times a day (BID) | RESPIRATORY_TRACT | Status: DC
  Administered 2023-03-02 – 2023-03-07 (×10): 0.5 mg via RESPIRATORY_TRACT
  Filled 2023-03-02 (×10): qty 2.5

## 2023-03-02 MED ORDER — INSULIN ASPART 100 UNIT/ML IJ SOLN
3.0000 [IU] | Freq: Three times a day (TID) | INTRAMUSCULAR | Status: DC
  Administered 2023-03-02 – 2023-03-05 (×7): 3 [IU] via SUBCUTANEOUS
  Filled 2023-03-02 (×8): qty 1

## 2023-03-02 MED ORDER — ADULT MULTIVITAMIN W/MINERALS CH
1.0000 | ORAL_TABLET | Freq: Every day | ORAL | Status: DC
  Administered 2023-03-02 – 2023-03-07 (×5): 1 via ORAL
  Filled 2023-03-02 (×7): qty 1

## 2023-03-02 MED ORDER — ALPRAZOLAM 0.5 MG PO TABS
0.5000 mg | ORAL_TABLET | Freq: Two times a day (BID) | ORAL | Status: DC | PRN
  Administered 2023-03-03 – 2023-03-07 (×4): 0.5 mg via ORAL
  Filled 2023-03-02 (×5): qty 1

## 2023-03-02 MED ORDER — SODIUM CHLORIDE 0.9 % IV BOLUS
500.0000 mL | Freq: Once | INTRAVENOUS | Status: AC
  Administered 2023-03-02: 500 mL via INTRAVENOUS

## 2023-03-02 MED ORDER — LEVALBUTEROL HCL 1.25 MG/0.5ML IN NEBU
1.2500 mg | INHALATION_SOLUTION | Freq: Two times a day (BID) | RESPIRATORY_TRACT | Status: DC
  Administered 2023-03-02 – 2023-03-07 (×10): 1.25 mg via RESPIRATORY_TRACT
  Filled 2023-03-02 (×10): qty 0.5

## 2023-03-02 MED ORDER — METOPROLOL TARTRATE 25 MG PO TABS
25.0000 mg | ORAL_TABLET | Freq: Two times a day (BID) | ORAL | Status: DC
  Administered 2023-03-02 – 2023-03-05 (×6): 25 mg via ORAL
  Filled 2023-03-02 (×7): qty 1

## 2023-03-02 NOTE — Progress Notes (Signed)
Initial Nutrition Assessment  DOCUMENTATION CODES:   Not applicable  INTERVENTION:  Continue with current Regular diet Continue with Ensure Plus High Protein po BID, each supplement provides 350 kcal and 20 grams of protein. Daily Multivitamin/minerals   NUTRITION DIAGNOSIS:   Increased nutrient needs related to chronic illness as evidenced by estimated needs.    GOAL:   Patient will meet greater than or equal to 90% of their needs    MONITOR:   PO intake, Supplement acceptance, Weight trends, Labs  REASON FOR ASSESSMENT:   Consult Assessment of nutrition requirement/status  ASSESSMENT:  62 y.o. admitted for worsening dyspnea. PMH; asthma, OSA on CPAP, DM2 prostate cancer, HTN.  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 Oncology consulted Review of EMR revealed.No significant weight loss.  Able to self feed. No appetite changes noted. RN reports that he is eating ~ 60% of most meals. Enjoys ensures does not like the boost breeze. Will discontinue Boost breeze per patient's preference.  Admit weight: 68 kg Current weight: 68 kg  Weight history: 02/27/23 68 kg  02/24/23 68.3 kg  02/05/23 68.5 kg      Average Meal Intake: No current documentation  Nutritionally Relevant Medications: Scheduled Meds:  amLODipine  10 mg Oral Daily   atorvastatin  10 mg Oral Daily   feeding supplement  1 Container Oral TID BM   feeding supplement  237 mL Oral BID BM   metoprolol tartrate  25 mg Oral BID   omega-3 acid ethyl esters  1,000 mg Oral Daily   [START ON 03/03/2023] Vitamin D (Ergocalciferol)  50,000 Units Oral Q7 days    Continuous Infusions:  azithromycin 500 mg (03/02/23 0629)   cefTRIAXone (ROCEPHIN)  IV 200 mL/hr at 03/02/23 1345   heparin 1,100 Units/hr (03/02/23 1010)   PRN Meds:.acetaminophen **OR** acetaminophen, ALPRAZolam, chlorpheniramine-HYDROcodone, hydrALAZINE,  HYDROmorphone, ipratropium-albuterol, ketorolac, magnesium hydroxide, metoprolol tartrate, ondansetron **OR** ondansetron (ZOFRAN) IV, senna-docusate, traZODone  Labs Reviewed: CBG ranges from 107-315 mg/dL over the last 24 hours HgbA1c 10.3    NUTRITION - FOCUSED PHYSICAL EXAM:  Deferred  Diet Order:   Diet Order             Diet NPO time specified  Diet effective midnight           Diet regular Room service appropriate? Yes; Fluid consistency: Thin  Diet effective now                   EDUCATION NEEDS:   Not appropriate for education at this time  Skin:  Skin Assessment: Reviewed RN Assessment  Last BM:  03/01/23  Height:   Ht Readings from Last 1 Encounters:  02/27/23 5\' 9"  (1.753 m)    Weight:   Wt Readings from Last 1 Encounters:  02/27/23 68 kg    Ideal Body Weight:     BMI:  Body mass index is 22.14 kg/m.  Estimated Nutritional Needs:   Kcal:  2050-2400 kcal/d  Protein:  90-105g/d  Fluid:  82ml/kcal    Jamelle Haring RDN, LDN Clinical Dietitian  RDN pager # available on Amion

## 2023-03-02 NOTE — Progress Notes (Signed)
ANTICOAGULATION CONSULT NOTE  Pharmacy Consult for heparin infusion Indication: pulmonary embolus  Allergies  Allergen Reactions   Other Anaphylaxis    peanuts   Morphine And Codeine Itching   Tree Extract Other (See Comments)    ALMONDS  ---- EYE/ FACIAL SWELLING   Wound Dressing Adhesive Rash    Patient Measurements: Height: 5\' 9"  (175.3 cm) Weight: 68 kg (149 lb 14.6 oz) IBW/kg (Calculated) : 70.7 Heparin Dosing Weight: 68 kg  Vital Signs: Temp: 99 F (37.2 C) (11/17 0423) Temp Source: Oral (11/17 0423) BP: 134/72 (11/17 0423) Pulse Rate: 111 (11/17 0423)  Labs: Recent Labs    02/27/23 2018 02/28/23 0058 02/28/23 0517 02/28/23 1228 03/01/23 0603 03/01/23 1305 03/01/23 1943 03/02/23 0235  HGB 11.2*  --  10.1*  --  10.0*  --   --  9.0*  HCT 34.9*  --  31.8*  --  30.9*  --   --  28.3*  PLT 369  --  324  --  304  --   --  335  APTT  --  74*  --    < > 105* 70* 70* 75*  LABPROT  --  27.6*  --   --   --   --   --   --   INR  --  2.5*  --   --   --   --   --   --   HEPARINUNFRC  --  >1.10*  --   --  >1.10*  --   --  0.67  CREATININE 0.78  --  0.57*  --  0.45*  --   --  0.64  TROPONINIHS 9  --   --   --   --   --   --   --    < > = values in this interval not displayed.    Estimated Creatinine Clearance: 93.3 mL/min (by C-G formula based on SCr of 0.64 mg/dL).   Medical History: Past Medical History:  Diagnosis Date   Asthma    Cancer (HCC)    prostate   Diabetes mellitus without complication (HCC)    type II   Hypertension    Sleep apnea    uses Cpap machine     Medications:  PTA Meds: Apixaban Starter Pack Started 02/26/23, last dose reported 02/27/23 at 1800.  Assessment: Pt is a 62 yo male started on Eliquis 02/26/23, for bilateral lower extremity DVTs,  presenting to ED c/o SOB now " positive for small nonocclusive segmental/subsegmental pulmonary embolus within the left upper lobe." Last dose of apixaban on 11/14 per Med Rec  Goal of Therapy:   Heparin level 0.3-0.7 units/ml aPTT 66-102 seconds Monitor platelets by anticoagulation protocol: Yes  Monitoring: 11/15 1228 aPTT 70 sec Therapeutic x 1 11/15 1917 aPTT 71 Therapeutic x 2 11/16 0603 aPTT 105, supratherapeutic / HL >1.1, not correlating 11/16 1305 aPTT 70 Therapeutic 11/16 1943 aPTT 70 Therapeutic x2 11/17 0235 aPTT 75, therapeutic x 3 / HL 0.67, correlating x 1   Plan: Continue heparin infusion at 1100 units/hr Check aPTT/HL with am labs Continue to titrate by aPTT until correlation with antiXa (HL) CBC daily while on heparin  Thank you for involving pharmacy in this patient's care.   Otelia Sergeant, PharmD, [facility] 03/02/2023 4:36 AM

## 2023-03-02 NOTE — Progress Notes (Signed)
PROGRESS NOTE    Tony Benson  VOZ:[id number] DOB: [birth year] DOA: 02/28/2023 PCP: Sherrie Mustache, MD     Brief Narrative:  62 year old with history of asthma, OSA on CPAP, DM2, prostate cancer comes to the ED for worsening dyspnea.  Patient was recently diagnosed with right lower extremity DVT and placed on Eliquis.  Now CTA chest shows small nonocclusive left upper lobe PE with moderate to large circumferential pericardial effusion and right-sided pleural effusion with groundglass opacity.  Also found to have a right hepatic mass of 12.6 cm.  MRI liver eventually showed concern for pancreatic head mass with multiple liver lesions.  Discussed with oncology, ultrasound-guided biopsy ordered.  Pericardial effusion stable on echocardiogram, cardiology will be available if patient decompensates.   Assessment & Plan:  Principal Problem:   Acute pulmonary embolism (HCC) Active Problems:   Sepsis due to pneumonia (HCC)   Type 2 diabetes mellitus without complications (HCC)   Essential hypertension   Dyslipidemia   Asthma, chronic, unspecified asthma severity, with acute exacerbation   Acute pulmonary embolism (HCC) without cor pulmonale Large Pericardial without tamponade Small rightpleural effusion Bilateral lower extremity DVTs Currently patient is on heparin drip while awaiting bx.  Eventually will transition to Lovenox 1mg /kg q12h. Lower extremity Dopplers on 11/13 already showed bilateral lower extremity DVTs. Echo shows preserved ef, large pericardial effusion without tamponade. Discussed with Dr Mariah Milling (Cards will be available if medically becomes unstable)   Sepsis due to pneumonia [facility]) Multifocal groundglass opacity in the lungs Possible concerns of sepsis. BNP = normal, ProCal 2.3 Continue azithromycin and Rocephin.  Sinus tachycardia - Combination of deconditioning, PE, tamponade, dehydration.  He is also quite anxious.  For now ordered Xanax, IV fluid bolus and metoprolol  twice daily. BP is stable.   Pancreatic mass with multiple liver lesions - MRI performed which shows pancreatic head mass with multiple liver lesions.  This is highly concerning for aggressive malignancy. Discussed with Dr Donneta Romberg.  CA 19-9 pending, discussed with IR.  Plans for ultrasound liver biopsy tomorrow, timing to be determined tomorrow.  N.p.o. past midnight   Type 2 diabetes mellitus without complications (HCC) Sliding scale and Accu-Chek.  On Jardiance, holding metformin.  Adjust as needed.    Essential hypertension On Norvasc, losartan.  IV as needed   Chronic asthma Bronchodilators   Dyslipidemia Lipitor  History of prostate cancer status post prostatectomy - Follows oncology/urology with [facility]  PT/OT - no follow up  DVT prophylaxis: Hep Drip Code Status: Full  Family Communication: Spouse at bedside Status is: Inpatient Multiple ongoing issues.  Continue hospital stay for liver Bx.    Subjective: Seen at bedside, no complaints.   Examination:  General exam: Appears calm and comfortable  Respiratory system: Clear to auscultation. Respiratory effort normal. Cardiovascular system: Sinus tachycardia, S1 & S2 heard, RRR. No JVD, murmurs, rubs, gallops or clicks. No pedal edema. Gastrointestinal system: Abdomen is nondistended, soft and nontender. No organomegaly or masses felt. Normal bowel sounds heard. Central nervous system: Alert and oriented. No focal neurological deficits. Extremities: Symmetric 5 x 5 power. Skin: No rashes, lesions or ulcers Psychiatry: Judgement and insight appear normal. Mood & affect appropriate.                   Diet Orders (From admission, onward)     Start     Ordered   03/03/23 0001  Diet NPO time specified  Diet effective midnight        03/02/23 1040  03/02/23 1041  Diet regular Room service appropriate? Yes; Fluid consistency: Thin  Diet effective now       Question Answer Comment  Room service  appropriate? Yes   Fluid consistency: Thin      03/02/23 1040            Objective: Vitals:   03/02/23 0423 03/02/23 0741 03/02/23 0809 03/02/23 1056  BP: 134/72  118/64 124/65  Pulse: (!) 111  (!) 121 (!) 115  Resp: (!) 22  20 19   Temp: 99 F (37.2 C)  98.5 F (36.9 C) 99.3 F (37.4 C)  TempSrc: Oral  Oral Oral  SpO2: 96% 93% 97% 97%  Weight:      Height:        Intake/Output Summary (Last 24 hours) at 03/02/2023 1256 Last data filed at 03/02/2023 1010 Gross per 24 hour  Intake 244.86 ml  Output --  Net 244.86 ml   Filed Weights   02/27/23 2008  Weight: 68 kg    Scheduled Meds:  amLODipine  10 mg Oral Daily   atorvastatin  10 mg Oral Daily   empagliflozin  25 mg Oral Daily   feeding supplement  1 Container Oral TID BM   feeding supplement  237 mL Oral BID BM   guaiFENesin  600 mg Oral BID   insulin aspart  0-5 Units Subcutaneous QHS   insulin aspart  0-9 Units Subcutaneous TID WC   insulin aspart  3 Units Subcutaneous TID WC   insulin glargine-yfgn  30 Units Subcutaneous Daily   ipratropium  0.5 mg Nebulization BID   levalbuterol  1.25 mg Nebulization BID   losartan  100 mg Oral Daily   metoprolol tartrate  25 mg Oral BID   omega-3 acid ethyl esters  1,000 mg Oral Daily   [START ON 03/03/2023] Vitamin D (Ergocalciferol)  50,000 Units Oral Q7 days   Continuous Infusions:  azithromycin 500 mg (03/02/23 0629)   cefTRIAXone (ROCEPHIN)  IV 2 g (03/02/23 0541)   heparin 1,100 Units/hr (03/02/23 1010)   sodium chloride      Nutritional status     Body mass index is 22.14 kg/m.  Data Reviewed:   CBC: Recent Labs  Lab 02/24/23 1447 02/27/23 2018 02/28/23 0517 03/01/23 0603 03/02/23 0235  WBC 19.0* 17.7* 18.1* 20.1* 19.1*  NEUTROABS 16.5*  --   --   --   --   HGB 11.4* 11.2* 10.1* 10.0* 9.0*  HCT 37.2* 34.9* 31.8* 30.9* 28.3*  MCV 88.8 85.7 90.6 85.8 85.8  PLT 366 369 324 304 335   Basic Metabolic Panel: Recent Labs  Lab 02/24/23 1447  02/27/23 2018 02/28/23 0517 03/01/23 0603 03/02/23 0235  NA 136 132* 135 137 135  K 4.9 4.2 4.0 3.8 3.8  CL 100 101 101 106 105  CO2 24 23 23  19* 22  GLUCOSE 207* 301* 254* 107* 315*  BUN 15 17 16 11 13   CREATININE 0.76 0.78 0.57* 0.45* 0.64  CALCIUM 10.0 9.5 9.5 8.9 8.3*  MG  --   --   --  1.9 1.9   GFR: Estimated Creatinine Clearance: 93.3 mL/min (by C-G formula based on SCr of 0.64 mg/dL). Liver Function Tests: Recent Labs  Lab 02/24/23 1447  AST 18  ALT 17  ALKPHOS 277*  BILITOT 0.9  PROT 7.1  ALBUMIN 2.7*   No results for input(s): "LIPASE", "AMYLASE" in the last 168 hours. No results for input(s): "AMMONIA" in the last 168 hours. Coagulation Profile: Recent  Labs  Lab 02/28/23 0058  INR 2.5*   Cardiac Enzymes: No results for input(s): "CKTOTAL", "CKMB", "CKMBINDEX", "TROPONINI" in the last 168 hours. BNP (last 3 results) No results for input(s): "PROBNP" in the last 8760 hours. HbA1C: Recent Labs    02/28/23 0516  HGBA1C 10.3*   CBG: Recent Labs  Lab 03/01/23 1140 03/01/23 1713 03/01/23 2021 03/02/23 0740 03/02/23 1059  GLUCAP 232* 324* 374* 228* 154*   Lipid Profile: No results for input(s): "CHOL", "HDL", "LDLCALC", "TRIG", "CHOLHDL", "LDLDIRECT" in the last 72 hours. Thyroid Function Tests: No results for input(s): "TSH", "T4TOTAL", "FREET4", "T3FREE", "THYROIDAB" in the last 72 hours. Anemia Panel: No results for input(s): "VITAMINB12", "FOLATE", "FERRITIN", "TIBC", "IRON", "RETICCTPCT" in the last 72 hours. Sepsis Labs: Recent Labs  Lab 02/28/23 0256 02/28/23 0516  PROCALCITON  --  2.30  LATICACIDVEN 1.1 1.0    Recent Results (from the past 240 hour(s))  Resp panel by RT-PCR (RSV, Flu A&B, Covid) Anterior Nasal Swab     Status: None   Collection Time: 02/28/23 12:58 AM   Specimen: Anterior Nasal Swab  Result Value Ref Range Status   SARS Coronavirus 2 by RT PCR NEGATIVE NEGATIVE Final    Comment: (NOTE) SARS-CoV-2 target nucleic  acids are NOT DETECTED.  The SARS-CoV-2 RNA is generally detectable in upper respiratory specimens during the acute phase of infection. The lowest concentration of SARS-CoV-2 viral copies this assay can detect is 138 copies/mL. A negative result does not preclude SARS-Cov-2 infection and should not be used as the sole basis for treatment or other patient management decisions. A negative result may occur with  improper specimen collection/handling, submission of specimen other than nasopharyngeal swab, presence of viral mutation(s) within the areas targeted by this assay, and inadequate number of viral copies(<138 copies/mL). A negative result must be combined with clinical observations, patient history, and epidemiological information. The expected result is Negative.  Fact Sheet for Patients:  BloggerCourse.com  Fact Sheet for Healthcare Providers:  SeriousBroker.it  This test is no t yet approved or cleared by the [place] FDA and  has been authorized for detection and/or diagnosis of SARS-CoV-2 by FDA under an Emergency Use Authorization (EUA). This EUA will remain  in effect (meaning this test can be used) for the duration of the COVID-19 declaration under Section 564(b)(1) of the Act, 21 U.S.C.section 360bbb-3(b)(1), unless the authorization is terminated  or revoked sooner.       Influenza A by PCR NEGATIVE NEGATIVE Final   Influenza B by PCR NEGATIVE NEGATIVE Final    Comment: (NOTE) The Xpert Xpress SARS-CoV-2/FLU/RSV plus assay is intended as an aid in the diagnosis of influenza from Nasopharyngeal swab specimens and should not be used as a sole basis for treatment. Nasal washings and aspirates are unacceptable for Xpert Xpress SARS-CoV-2/FLU/RSV testing.  Fact Sheet for Patients: BloggerCourse.com  Fact Sheet for Healthcare Providers: SeriousBroker.it  This  test is not yet approved or cleared by the [place] FDA and has been authorized for detection and/or diagnosis of SARS-CoV-2 by FDA under an Emergency Use Authorization (EUA). This EUA will remain in effect (meaning this test can be used) for the duration of the COVID-19 declaration under Section 564(b)(1) of the Act, 21 U.S.C. section 360bbb-3(b)(1), unless the authorization is terminated or revoked.     Resp Syncytial Virus by PCR NEGATIVE NEGATIVE Final    Comment: (NOTE) Fact Sheet for Patients: BloggerCourse.com  Fact Sheet for Healthcare Providers: SeriousBroker.it  This test is not yet  approved or cleared by the [place] and has been authorized for detection and/or diagnosis of SARS-CoV-2 by FDA under an Emergency Use Authorization (EUA). This EUA will remain in effect (meaning this test can be used) for the duration of the COVID-19 declaration under Section 564(b)(1) of the Act, 21 U.S.C. section 360bbb-3(b)(1), unless the authorization is terminated or revoked.  Performed at [facility], [address] [place], [place] [address]   Culture, blood (routine x 2)     Status: None (Preliminary result)   Collection Time: 02/28/23  2:56 AM   Specimen: BLOOD  Result Value Ref Range Status   Specimen Description BLOOD BLOOD LEFT ARM  Final   Special Requests   Final    BOTTLES DRAWN AEROBIC AND ANAEROBIC Blood Culture adequate volume   Culture   Final    NO GROWTH 2 DAYS Performed at [facility], [address], [place], [place] [address]    Report Status PENDING  Incomplete  Culture, blood (routine x 2)     Status: None (Preliminary result)   Collection Time: 02/28/23  3:01 AM   Specimen: BLOOD  Result Value Ref Range Status   Specimen Description BLOOD BLOOD RIGHT ARM  Final   Special Requests   Final    BOTTLES DRAWN AEROBIC AND ANAEROBIC Blood Culture adequate volume   Culture    Final    NO GROWTH 2 DAYS Performed at [facility], [address] [place], [place] [address]    Report Status PENDING  Incomplete         Radiology Studies: ECHOCARDIOGRAM COMPLETE  Result Date: 03/01/2023    ECHOCARDIOGRAM REPORT   Patient Name:   NAJJA CHATEL Date of Exam: 03/01/2023 Medical Rec #:  [id number]     Height:       69.0 in Accession #:    [id number]    Weight:       149.9 lb Date of Birth:  [birth year]    BSA:          1.828 m Patient Age:    61 years      BP:           138/75 mmHg Patient Gender: M             HR:           116 bpm. Exam Location:  [facility] Procedure: 2D Echo Indications:     Pulmonary Embolus I26.09  History:         Patient has no prior history of Echocardiogram examinations.  Sonographer:     Overton Mam RDCS, FASE Referring Phys:  [id number] Vernetta Honey MANSY Diagnosing Phys: Julien Nordmann MD IMPRESSIONS  1. Left ventricular ejection fraction, by estimation, is 60 to 65%. The left ventricle has normal function. The left ventricle has no regional wall motion abnormalities. Left ventricular diastolic parameters are indeterminate.  2. Right ventricular systolic function is normal. The right ventricular size is normal. There is moderately elevated pulmonary artery systolic pressure. The estimated right ventricular systolic pressure is 54.0 mmHg.  3. Large pericardial effusion. The pericardial effusion is circumferential. There is no evidence of cardiac tamponade. 1.95 off the RV free wall, 2.16 cm off the apical region, 1.70 cm off the LV free wall. IVC is not dilated at 1.16 cm  4. The mitral valve is normal in structure. No evidence of mitral valve regurgitation. No evidence of mitral stenosis.  5. The aortic valve is tricuspid. Aortic valve  regurgitation is not visualized. No aortic stenosis is present.  6. The inferior vena cava is normal in size with greater than 50% respiratory variability, suggesting right atrial pressure of 3 mmHg. FINDINGS  Left  Ventricle: Left ventricular ejection fraction, by estimation, is 60 to 65%. The left ventricle has normal function. The left ventricle has no regional wall motion abnormalities. The left ventricular internal cavity size was normal in size. There is  no left ventricular hypertrophy. Left ventricular diastolic parameters are indeterminate. Right Ventricle: The right ventricular size is normal. No increase in right ventricular wall thickness. Right ventricular systolic function is normal. There is moderately elevated pulmonary artery systolic pressure. The tricuspid regurgitant velocity is 3.50 m/s, and with an assumed right atrial pressure of 5 mmHg, the estimated right ventricular systolic pressure is 54.0 mmHg. Left Atrium: Left atrial size was normal in size. Right Atrium: Right atrial size was normal in size. Pericardium: A large pericardial effusion is present. The pericardial effusion is circumferential. There is no evidence of cardiac tamponade. Mitral Valve: The mitral valve is normal in structure. No evidence of mitral valve regurgitation. No evidence of mitral valve stenosis. Tricuspid Valve: The tricuspid valve is normal in structure. Tricuspid valve regurgitation is mild . No evidence of tricuspid stenosis. Aortic Valve: The aortic valve is tricuspid. Aortic valve regurgitation is not visualized. No aortic stenosis is present. Aortic valve peak gradient measures 12.0 mmHg. Pulmonic Valve: The pulmonic valve was normal in structure. Pulmonic valve regurgitation is not visualized. No evidence of pulmonic stenosis. Aorta: The aortic root is normal in size and structure. Venous: The inferior vena cava is normal in size with greater than 50% respiratory variability, suggesting right atrial pressure of 3 mmHg. IAS/Shunts: No atrial level shunt detected by color flow Doppler.  LEFT VENTRICLE PLAX 2D LVIDd:         4.60 cm   Diastology LVIDs:         2.90 cm   LV e' medial:    11.70 cm/s LV PW:         1.00 cm    LV E/e' medial:  8.8 LV IVS:        1.00 cm   LV e' lateral:   18.60 cm/s LVOT diam:     2.10 cm   LV E/e' lateral: 5.5 LV SV:         67 LV SV Index:   37 LVOT Area:     3.46 cm  RIGHT VENTRICLE RV Basal diam:  2.50 cm RV S prime:     22.30 cm/s TAPSE (M-mode): 1.8 cm LEFT ATRIUM             Index        RIGHT ATRIUM          Index LA diam:        2.70 cm 1.48 cm/m   RA Area:     7.05 cm LA Vol (A2C):   45.8 ml 25.06 ml/m  RA Volume:   9.68 ml  5.30 ml/m LA Vol (A4C):   25.5 ml 13.95 ml/m LA Biplane Vol: 35.4 ml 19.37 ml/m  AORTIC VALVE                 PULMONIC VALVE AV Area (Vmax): 2.46 cm     PV Vmax:        1.10 m/s AV Vmax:        173.00 cm/s  PV Peak grad:   4.8 mmHg AV Peak  Grad:   12.0 mmHg    RVOT Peak grad: 4 mmHg LVOT Vmax:      123.00 cm/s LVOT Vmean:     79.500 cm/s LVOT VTI:       0.193 m  AORTA                        PULMONARY ARTERY Ao Root diam: 3.50 cm        MPA diam:        3.00 cm Ao Asc diam:  2.70 cm MITRAL VALVE                TRICUSPID VALVE MV Area (PHT): 7.22 cm     TR Peak grad:   49.0 mmHg MV Decel Time: 105 msec     TR Vmax:        350.00 cm/s MV E velocity: 103.00 cm/s MV A velocity: 87.50 cm/s   SHUNTS MV E/A ratio:  1.18         Systemic VTI:  0.19 m                             Systemic Diam: 2.10 cm Julien Nordmann MD Electronically signed by Julien Nordmann MD Signature Date/Time: 03/01/2023/3:02:54 PM    Final    MR LIVER W WO CONTRAST  Result Date: 03/01/2023 CLINICAL DATA:  62 year old male with history of shortness of breath recently diagnosed with blood clots in the legs. Hepatic masses identified on recent chest CTA. Further evaluation to exclude the possibility of malignancy. EXAM: MRI ABDOMEN WITHOUT AND WITH CONTRAST TECHNIQUE: Multiplanar multisequence MR imaging of the abdomen was performed both before and after the administration of intravenous contrast. CONTRAST:  7mL GADAVIST GADOBUTROL 1 MMOL/ML IV SOLN COMPARISON:  No prior abdominal MRI.  Chest CTA  02/27/2023. FINDINGS: Comment: Portions of today's examination are limited by considerable patient respiratory motion. Lower chest: Small right pleural effusion with areas of atelectasis and/or scarring in the right lung base. Hepatobiliary: There are multiple hepatic lesions highly concerning for widespread metastatic disease to the liver. The largest of these is a dominant lesion in the central aspect of the liver, predominantly centered in segment 8 (axial image 15 of series 4 and coronal image 22 series 3) measuring up to 13.7 x 9.6 x 12.1 cm. These lesions are heterogeneous in signal intensity but generally T1 hypointense, slightly T2 hyperintense, hypovascular with predominantly peripheral enhancement which increases slightly on delayed images, and extensive internal diffusion restriction. No definite hypervascular hepatic lesion is identified. Gallbladder is not visualized, likely surgically absent. No intra or extrahepatic biliary ductal dilatation. Pancreas: In the region of the pancreatic head (best appreciated on axial image 55 of series 14 and coronal image 45 of series 20) there is what appears to be a poorly defined hypovascular mass estimated to measure approximately 4.0 x 2.6 x 2.9 cm, which is located immediately anterior and lateral to the superior mesenteric vein adjacent to the splenic pulmonary confluence, and inferior to the proximal portal vein, with minimal obscuration of the intervening fat plane adjacent to the superior mesenteric vein. This lesion appears to obstruct the main pancreatic duct (axial image 49 of series 14), proximal to which the main pancreatic duct is severely dilated measuring up to 12 mm in the body of the pancreas. Diffuse atrophy throughout the body and tail of the pancreas is noted. Immediately anterior to this mass (axial image 57  of series 13) there is an irregular-shaped fluid collection which is high T1 signal intensity and high T2 signal intensity, presumably  containing proteinaceous/hemorrhagic contents, estimated to measure approximately 3.6 x 2.7 cm, likely a pancreatic pseudocyst. Spleen:  Unremarkable. Adrenals/Urinary Tract: Multiple T1 hypointense, T2 hyperintense, nonenhancing lesions in the kidneys are compatible with simple cysts (Bosniak class 1), which require no imaging follow-up, largest of which measure up to approximately 2.2 cm in diameter in the lower pole of the left kidney. No aggressive appearing renal lesions. No hydroureteronephrosis in the visualized portions of the abdomen. Bilateral adrenal glands are normal in appearance. Stomach/Bowel: Visualized portions are unremarkable. Vascular/Lymphatic: No aneurysm identified in the visualized abdominal vasculature. No definite lymphadenopathy confidently identified in the abdomen. Other:  Trace volume of perihepatic ascites. Musculoskeletal: No aggressive appearing osseous lesions are noted in the visualized portions of the skeleton. IMPRESSION: 1. Large hypovascular mass in the region of the head of the pancreas causing occlusion of the main pancreatic duct, highly concerning for primary pancreatic neoplasm. Numerous hypovascular lesions scattered throughout the liver, presumably reflective of widespread metastatic disease to the liver, with the largest lesion a dominant mass centered in the right lobe of the liver measuring up to 13.7 x 9.6 x 12.1 cm. 2. Trace volume of perihepatic ascites. 3. Fluid collection anterior to the mass adjacent to the head of the pancreas, presumably a proteinaceous/hemorrhagic pancreatic pseudocyst. 4. Small right pleural effusion. 5. Additional incidental findings, as above. Electronically Signed   By: Trudie Reed M.D.   On: 03/01/2023 05:35           LOS: 2 days   Time spent= 35 mins    Miguel Rota, MD [facility]  If 7PM-7AM, please contact night-coverage  03/02/2023, 12:56 PM

## 2023-03-02 NOTE — Plan of Care (Signed)
New pancreatic mass and liver masses incidentally noted on CT - request to IR for liver lesion biopsy.  Patient history and imaging reviewed by Dr. Lowella Dandy who approves procedure when patient has been treated for pneumonia. Timing to be determined by performing IR physician at [facility]. Will make patient NPO at midnight in case patient is deemed stable for biopsy on 11/18.  Plan: - NPO at midnight pending IR physician review of patient on 11/18 - CBC, INR AM of 11/18 - Heparin gtt will need to be held x 4 hours, IR RN will coordinate with floor staff once procedure time is determined - IR APP will see for consult/consent  Please call on call IR MD with questions or concerns.  Lynnette Caffey, PA-C

## 2023-03-03 ENCOUNTER — Inpatient Hospital Stay: Payer: BC Managed Care – PPO | Admitting: Radiology

## 2023-03-03 DIAGNOSIS — I2699 Other pulmonary embolism without acute cor pulmonale: Secondary | ICD-10-CM | POA: Diagnosis not present

## 2023-03-03 HISTORY — PX: IR US LIVER BIOPSY: IMG936

## 2023-03-03 LAB — CEA: CEA: 150 ng/mL — ABNORMAL HIGH (ref 0.0–4.7)

## 2023-03-03 LAB — BASIC METABOLIC PANEL
Anion gap: 9 (ref 5–15)
BUN: 12 mg/dL (ref 8–23)
CO2: 24 mmol/L (ref 22–32)
Calcium: 8.8 mg/dL — ABNORMAL LOW (ref 8.9–10.3)
Chloride: 105 mmol/L (ref 98–111)
Creatinine, Ser: 0.54 mg/dL — ABNORMAL LOW (ref 0.61–1.24)
GFR, Estimated: >60 mL/min (ref >60)
Glucose, Bld: 115 mg/dL — ABNORMAL HIGH (ref 70–99)
Potassium: 3.6 mmol/L (ref 3.5–5.1)
Sodium: 138 mmol/L (ref 135–145)

## 2023-03-03 LAB — PROTIME-INR
INR: 1.3 — ABNORMAL HIGH (ref 0.8–1.2)
Prothrombin Time: 16.2 s — ABNORMAL HIGH (ref 11.4–15.2)

## 2023-03-03 LAB — CA 19-9 (SERIAL): CA 19-9: 3 U/mL (ref 0–35)

## 2023-03-03 LAB — MAGNESIUM: Magnesium: 2.1 mg/dL (ref 1.7–2.4)

## 2023-03-03 LAB — CBC
HCT: 29 % — ABNORMAL LOW (ref 39.0–52.0)
Hemoglobin: 9.2 g/dL — ABNORMAL LOW (ref 13.0–17.0)
MCH: 27.5 pg (ref 26.0–34.0)
MCHC: 31.7 g/dL (ref 30.0–36.0)
MCV: 86.8 fL (ref 80.0–100.0)
Platelets: 353 10*3/uL (ref 150–400)
RBC: 3.34 MIL/uL — ABNORMAL LOW (ref 4.22–5.81)
RDW: 13.6 % (ref 11.5–15.5)
WBC: 18.6 10*3/uL — ABNORMAL HIGH (ref 4.0–10.5)
nRBC: 0 % (ref 0.0–0.2)

## 2023-03-03 LAB — GLUCOSE, CAPILLARY
Glucose-Capillary: 128 mg/dL — ABNORMAL HIGH (ref 70–99)
Glucose-Capillary: 69 mg/dL — ABNORMAL LOW (ref 70–99)
Glucose-Capillary: 66 mg/dL — ABNORMAL LOW (ref 70–99)
Glucose-Capillary: 85 mg/dL (ref 70–99)
Glucose-Capillary: 186 mg/dL — ABNORMAL HIGH (ref 70–99)
Glucose-Capillary: 331 mg/dL — ABNORMAL HIGH (ref 70–99)

## 2023-03-03 LAB — APTT: aPTT: 65 s — ABNORMAL HIGH (ref 24–36)

## 2023-03-03 LAB — HEPARIN LEVEL (UNFRACTIONATED): Heparin Unfractionated: 0.23 [IU]/mL — ABNORMAL LOW (ref 0.30–0.70)

## 2023-03-03 MED ORDER — ENOXAPARIN SODIUM 80 MG/0.8ML IJ SOSY: 1.0000 mg/kg | PREFILLED_SYRINGE | Freq: Two times a day (BID) | INTRAMUSCULAR | Status: DC

## 2023-03-03 MED ORDER — MIDAZOLAM HCL 5 MG/5ML IJ SOLN
INTRAMUSCULAR | Status: AC | PRN
  Administered 2023-03-03: 1 mg via INTRAVENOUS

## 2023-03-03 MED ORDER — FENTANYL CITRATE (PF) 100 MCG/2ML IJ SOLN
INTRAMUSCULAR | Status: AC
  Filled 2023-03-03: qty 2

## 2023-03-03 MED ORDER — MIDAZOLAM HCL 2 MG/2ML IJ SOLN
INTRAMUSCULAR | Status: AC
Start: 2023-03-03 — End: ?
  Filled 2023-03-03: qty 2

## 2023-03-03 MED ORDER — GELATIN ABSORBABLE 12-7 MM EX MISC
1.0000 | Freq: Once | CUTANEOUS | Status: AC
  Administered 2023-03-03: 1 via TOPICAL
  Filled 2023-03-03: qty 1

## 2023-03-03 MED ORDER — AZITHROMYCIN 250 MG PO TABS
500.0000 mg | ORAL_TABLET | Freq: Once | ORAL | Status: AC
  Administered 2023-03-04: 500 mg via ORAL
  Filled 2023-03-03: qty 2

## 2023-03-03 MED ORDER — DEXTROSE 50 % IV SOLN
INTRAVENOUS | Status: AC
  Filled 2023-03-03: qty 50

## 2023-03-03 MED ORDER — FENTANYL CITRATE (PF) 100 MCG/2ML IJ SOLN
INTRAMUSCULAR | Status: AC | PRN
  Administered 2023-03-03: 50 ug via INTRAVENOUS

## 2023-03-03 MED ORDER — LIDOCAINE HCL 1 % IJ SOLN
INTRAMUSCULAR | Status: AC
  Filled 2023-03-03: qty 20

## 2023-03-03 MED ORDER — DEXTROSE 50 % IV SOLN
12.5000 g | INTRAVENOUS | Status: AC
  Administered 2023-03-03: 12.5 g via INTRAVENOUS

## 2023-03-03 MED ORDER — ENOXAPARIN SODIUM 80 MG/0.8ML IJ SOSY
1.0000 mg/kg | PREFILLED_SYRINGE | Freq: Two times a day (BID) | INTRAMUSCULAR | Status: DC
  Administered 2023-03-03 – 2023-03-05 (×4): 67.5 mg via SUBCUTANEOUS
  Filled 2023-03-03 (×4): qty 0.8

## 2023-03-03 MED ORDER — LIDOCAINE 1 % OPTIME INJ - NO CHARGE
5.0000 mL | Freq: Once | INTRAMUSCULAR | Status: AC
  Administered 2023-03-03: 5 mL via INTRADERMAL
  Filled 2023-03-03: qty 6

## 2023-03-03 NOTE — Consult Note (Signed)
Chief Complaint: Liver mass. Request is for liver mass biopsy  Referring Physician(s): Dr. Stephania Fragmin  Supervising Physician: Malachy Moan  Patient Status: [facility] - In-pt  History of Present Illness: Tony Benson is a 62 y.o. male 62 y.o. male inpatient. History of DM, HTN, prostate cancer. Recently diagnosed with RLE DVT (on eliquis). Presented to the ED on 11.15.24 with [facility]. Found to have a PE, a large pericardial effusion and  PNA. Found to have an incidental right hepatic mass. CT Angio Chest from 11.14.24 reads Right hepatic masses measuring up to 12.6 cm. Findings are suspicious for metastatic disease versus liver carcinoma. When the patient is clinically stable and able to follow directions and hold their breath (preferably as an outpatient) further evaluation with dedicated abdominal MRI should be considered. MR Liver from Large hypovascular mass in the region of the head of the pancreas causing occlusion of the main pancreatic duct, highly concerning for primary pancreatic neoplasm. Numerous hypovascular lesions scattered throughout the liver, presumably reflective of widespread metastatic disease to the liver, with the largest lesion a dominant mass centered. Oncology has been consulted. Team is requesting a right hepatic mass biopsy for further evaluation of an aggressive malignancy.  Wife at bedside. Patient alert and laying in bed,calm. Endorses right shoulder pain that he states is chronic in nature. Denies any fevers, headache, chest pain, SOB, cough, abdominal pain, nausea, vomiting or bleeding.   Patient is on a heparin gtt. WBC is 18.6, IR 1.3, PTT is 65. Allergies include morphine& codeine and wound dressing. Patient is tachycardic. Heart rate 118-120  Past Medical History:  Diagnosis Date   Asthma    Cancer (HCC)    prostate   Diabetes mellitus without complication (HCC)    type II   Hypertension    Sleep apnea    uses Cpap machine     Past Surgical  History:  Procedure Laterality Date   CHOLECYSTECTOMY     COLONOSCOPY WITH PROPOFOL N/A 11/18/2018   Procedure: COLONOSCOPY WITH PROPOFOL;  Surgeon: Pasty Spillers, MD;  Location: [facility] ENDOSCOPY;  Service: Gastroenterology;  Laterality: N/A;   COLONOSCOPY WITH PROPOFOL N/A 02/05/2023   Procedure: COLONOSCOPY WITH PROPOFOL;  Surgeon: Toney Reil, MD;  Location: [facility] ENDOSCOPY;  Service: Gastroenterology;  Laterality: N/A;   LAPAROSCOPIC RETROPUBIC PROSTATECTOMY     2023   LUNG SURGERY     "ligation of thoracic duct"   PROSTATE BIOPSY N/A 09/21/2020   Procedure: PROSTATE BIOPSY [name];  Surgeon: [name], MD;  Location: [facility] ORS;  Service: Urology;  Laterality: N/A;    Allergies: Other, Morphine and codeine, Tree extract, and Wound dressing adhesive  Medications: Prior to Admission medications   Medication Sig Start Date End Date Taking? Authorizing Provider  amLODipine (NORVASC) 10 MG tablet Take 10 mg by mouth daily. 09/10/17  Yes [provider]  APIXABAN Everlene Balls) VTE STARTER PACK (10MG  AND 5MG ) Take as directed on package: start with two-5mg  tablets twice daily for 7 days. On day 8, switch to one-5mg  tablet twice daily. 02/26/23  Yes Earna Coder, MD  atorvastatin (LIPITOR) 10 MG tablet Take 10 mg by mouth daily.   Yes [provider]  JARDIANCE 25 MG TABS tablet Take 25 mg by mouth daily. 12/23/22  Yes [provider]  losartan (COZAAR) 100 MG tablet Take 100 mg by mouth daily. 08/15/17  Yes [provider]  metFORMIN (GLUCOPHAGE) 1000 MG tablet Take 1,000 mg by mouth 2 (two) times daily with  a meal.   Yes [provider]  mometasone-formoterol (DULERA) 100-5 MCG/ACT AERO Inhale 2 puffs into the lungs 2 (two) times daily as needed for wheezing or shortness of breath.   Yes [provider]  Omega-3 Fatty Acids (FISH OIL) 1000 MG CAPS Take 1,000 mg by mouth daily.   Yes [provider]  RYBELSUS 7  MG TABS PLEASE SEE ATTACHED FOR DETAILED DIRECTIONS 12/21/22  Yes [provider]  TOUJEO SOLOSTAR 300 UNIT/ML Solostar Pen SMARTSIG:25 Unit(s) SUB-Q Daily 12/02/22  Yes [provider]  potassium chloride SA (KLOR-CON M20) 20 MEQ tablet Take by mouth. Patient not taking: Reported on 02/24/2023 09/15/20   [provider]  senna-docusate (SENOKOT-S) 8.6-50 MG tablet Take by mouth. Patient not taking: Reported on 02/28/2023 11/16/21   [provider]  sildenafil (VIAGRA) 50 MG tablet Take 50 mg by mouth every 3 (three) days. Patient not taking: Reported on 02/28/2023    [provider]  Vitamin D, Ergocalciferol, (DRISDOL) 50000 units CAPS capsule Take 50,000 Units by mouth every 7 (seven) days.    [provider]     Family History  Problem Relation Age of Onset   Allergies Sister     Social History   Socioeconomic History   Marital status: Married    Spouse name: Not on file   Number of children: Not on file   Years of education: Not on file   Highest education level: Not on file  Occupational History   Occupation: Automotive engineer   Tobacco Use   Smoking status: Never   Smokeless tobacco: Never  Vaping Use   Vaping status: Never Used  Substance and Sexual Activity   Alcohol use: No   Drug use: No   Sexual activity: Not on file  Other Topics Concern   Not on file  Social History Narrative   Not on file   Social Determinants of Health   Financial Resource Strain: Not on file  Food Insecurity: No Food Insecurity (02/28/2023)   Hunger Vital Sign    Worried About Running Out of Food in the Last Year: Never true    Ran Out of Food in the Last Year: Never true  Transportation Needs: No Transportation Needs (02/28/2023)   PRAPARE - Administrator, Civil Service (Medical): No    Lack of Transportation (Non-Medical): No  Physical Activity: Not on file  Stress: Not on file  Social Connections: Not on file     Review  of Systems: A 12 point ROS discussed and pertinent positives are indicated in the HPI above.  All other systems are negative.  Review of Systems  Constitutional:  Negative for fever.  HENT:  Negative for congestion.   Respiratory:  Negative for cough and shortness of breath.   Cardiovascular:  Negative for chest pain.  Gastrointestinal:  Negative for abdominal pain.  Musculoskeletal:  Positive for arthralgias (right shoulder chronic).  Neurological:  Negative for headaches.  Psychiatric/Behavioral:  Negative for behavioral problems and confusion.     Vital Signs: BP 128/73 (BP Location: Right Arm)   Pulse (!) 117   Temp 99.3 F (37.4 C)   Resp 18   Ht 5\' 9"  (1.753 m)   Wt 148 lb 13 oz (67.5 kg)   SpO2 96%   BMI 21.98 kg/m     Physical Exam Vitals and nursing note reviewed.  Constitutional:      Appearance: He is well-developed.  HENT:     Head: Normocephalic.  Cardiovascular:     Rate and Rhythm: Regular rhythm. Tachycardia present.  Pulmonary:     Effort: Pulmonary effort is normal.  Musculoskeletal:        General: Normal range of motion.     Cervical back: Normal range of motion.  Skin:    General: Skin is dry.  Neurological:     General: No focal deficit present.     Mental Status: He is alert and oriented to person, place, and time.  Psychiatric:        Mood and Affect: Mood normal.        Behavior: Behavior normal.     Imaging: ECHOCARDIOGRAM COMPLETE  Result Date: 03/01/2023    ECHOCARDIOGRAM REPORT   Patient Name:   Tony Benson Date of Exam: 03/01/2023 Medical Rec #:  [id number]     Height:       69.0 in Accession #:    [id number]    Weight:       149.9 lb Date of Birth:  Apr 13, 1961    BSA:          1.828 m Patient Age:    61 years      BP:           138/75 mmHg Patient Gender: M             HR:           116 bpm. Exam Location:  [facility] Procedure: 2D Echo Indications:     Pulmonary Embolus I26.09  History:         Patient has no prior history of  Echocardiogram examinations.  Sonographer:     Overton Mam RDCS, FASE Referring Phys:  [id number] Vernetta Honey MANSY Diagnosing Phys: Julien Nordmann MD IMPRESSIONS  1. Left ventricular ejection fraction, by estimation, is 60 to 65%. The left ventricle has normal function. The left ventricle has no regional wall motion abnormalities. Left ventricular diastolic parameters are indeterminate.  2. Right ventricular systolic function is normal. The right ventricular size is normal. There is moderately elevated pulmonary artery systolic pressure. The estimated right ventricular systolic pressure is 54.0 mmHg.  3. Large pericardial effusion. The pericardial effusion is circumferential. There is no evidence of cardiac tamponade. 1.95 off the RV free wall, 2.16 cm off the apical region, 1.70 cm off the LV free wall. IVC is not dilated at 1.16 cm  4. The mitral valve is normal in structure. No evidence of mitral valve regurgitation. No evidence of mitral stenosis.  5. The aortic valve is tricuspid. Aortic valve regurgitation is not visualized. No aortic stenosis is present.  6. The inferior vena cava is normal in size with greater than 50% respiratory variability, suggesting right atrial pressure of 3 mmHg. FINDINGS  Left Ventricle: Left ventricular ejection fraction, by estimation, is 60 to 65%. The left ventricle has normal function. The left ventricle has no regional wall motion abnormalities. The left ventricular internal cavity size was normal in size. There is  no left ventricular hypertrophy. Left ventricular diastolic parameters are indeterminate. Right Ventricle: The right ventricular size is normal. No increase in right ventricular wall thickness. Right ventricular systolic function is normal. There is moderately elevated pulmonary artery systolic pressure. The tricuspid regurgitant velocity is 3.50 m/s, and with an assumed right atrial pressure of 5 mmHg, the estimated right ventricular systolic pressure is 54.0 mmHg.  Left Atrium: Left atrial size was normal in size. Right Atrium: Right atrial size was normal in size. Pericardium: A large pericardial  effusion is present. The pericardial effusion is circumferential. There is no evidence of cardiac tamponade. Mitral Valve: The mitral valve is normal in structure. No evidence of mitral valve regurgitation. No evidence of mitral valve stenosis. Tricuspid Valve: The tricuspid valve is normal in structure. Tricuspid valve regurgitation is mild . No evidence of tricuspid stenosis. Aortic Valve: The aortic valve is tricuspid. Aortic valve regurgitation is not visualized. No aortic stenosis is present. Aortic valve peak gradient measures 12.0 mmHg. Pulmonic Valve: The pulmonic valve was normal in structure. Pulmonic valve regurgitation is not visualized. No evidence of pulmonic stenosis. Aorta: The aortic root is normal in size and structure. Venous: The inferior vena cava is normal in size with greater than 50% respiratory variability, suggesting right atrial pressure of 3 mmHg. IAS/Shunts: No atrial level shunt detected by color flow Doppler.  LEFT VENTRICLE PLAX 2D LVIDd:         4.60 cm   Diastology LVIDs:         2.90 cm   LV e' medial:    11.70 cm/s LV PW:         1.00 cm   LV E/e' medial:  8.8 LV IVS:        1.00 cm   LV e' lateral:   18.60 cm/s LVOT diam:     2.10 cm   LV E/e' lateral: 5.5 LV SV:         67 LV SV Index:   37 LVOT Area:     3.46 cm  RIGHT VENTRICLE RV Basal diam:  2.50 cm RV S prime:     22.30 cm/s TAPSE (M-mode): 1.8 cm LEFT ATRIUM             Index        RIGHT ATRIUM          Index LA diam:        2.70 cm 1.48 cm/m   RA Area:     7.05 cm LA Vol (A2C):   45.8 ml 25.06 ml/m  RA Volume:   9.68 ml  5.30 ml/m LA Vol (A4C):   25.5 ml 13.95 ml/m LA Biplane Vol: 35.4 ml 19.37 ml/m  AORTIC VALVE                 PULMONIC VALVE AV Area (Vmax): 2.46 cm     PV Vmax:        1.10 m/s AV Vmax:        173.00 cm/s  PV Peak grad:   4.8 mmHg AV Peak Grad:   12.0 mmHg     RVOT Peak grad: 4 mmHg LVOT Vmax:      123.00 cm/s LVOT Vmean:     79.500 cm/s LVOT VTI:       0.193 m  AORTA                        PULMONARY ARTERY Ao Root diam: 3.50 cm        MPA diam:        3.00 cm Ao Asc diam:  2.70 cm MITRAL VALVE                TRICUSPID VALVE MV Area (PHT): 7.22 cm     TR Peak grad:   49.0 mmHg MV Decel Time: 105 msec     TR Vmax:        350.00 cm/s MV E velocity: 103.00 cm/s MV A velocity: 87.50 cm/s  SHUNTS MV E/A ratio:  1.18         Systemic VTI:  0.19 m                             Systemic Diam: 2.10 cm Julien Nordmann MD Electronically signed by Julien Nordmann MD Signature Date/Time: 03/01/2023/3:02:54 PM    Final    MR LIVER W WO CONTRAST  Result Date: 03/01/2023 CLINICAL DATA:  62 year old male with history of shortness of breath recently diagnosed with blood clots in the legs. Hepatic masses identified on recent chest CTA. Further evaluation to exclude the possibility of malignancy. EXAM: MRI ABDOMEN WITHOUT AND WITH CONTRAST TECHNIQUE: Multiplanar multisequence MR imaging of the abdomen was performed both before and after the administration of intravenous contrast. CONTRAST:  7mL GADAVIST GADOBUTROL 1 MMOL/ML IV SOLN COMPARISON:  No prior abdominal MRI.  Chest CTA 02/27/2023. FINDINGS: Comment: Portions of today's examination are limited by considerable patient respiratory motion. Lower chest: Small right pleural effusion with areas of atelectasis and/or scarring in the right lung base. Hepatobiliary: There are multiple hepatic lesions highly concerning for widespread metastatic disease to the liver. The largest of these is a dominant lesion in the central aspect of the liver, predominantly centered in segment 8 (axial image 15 of series 4 and coronal image 22 series 3) measuring up to 13.7 x 9.6 x 12.1 cm. These lesions are heterogeneous in signal intensity but generally T1 hypointense, slightly T2 hyperintense, hypovascular with predominantly peripheral enhancement which  increases slightly on delayed images, and extensive internal diffusion restriction. No definite hypervascular hepatic lesion is identified. Gallbladder is not visualized, likely surgically absent. No intra or extrahepatic biliary ductal dilatation. Pancreas: In the region of the pancreatic head (best appreciated on axial image 55 of series 14 and coronal image 45 of series 20) there is what appears to be a poorly defined hypovascular mass estimated to measure approximately 4.0 x 2.6 x 2.9 cm, which is located immediately anterior and lateral to the superior mesenteric vein adjacent to the splenic pulmonary confluence, and inferior to the proximal portal vein, with minimal obscuration of the intervening fat plane adjacent to the superior mesenteric vein. This lesion appears to obstruct the main pancreatic duct (axial image 49 of series 14), proximal to which the main pancreatic duct is severely dilated measuring up to 12 mm in the body of the pancreas. Diffuse atrophy throughout the body and tail of the pancreas is noted. 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 Spleen:  Unremarkable. Adrenals/Urinary Tract: Multiple T1 hypointense, T2 hyperintense, nonenhancing lesions in the kidneys are compatible with simple cysts (Bosniak class 1), which require no imaging follow-up, largest of which measure up to approximately 2.2 cm in diameter in the lower pole of the left kidney. No aggressive appearing renal lesions. No hydroureteronephrosis in the visualized portions of the abdomen. Bilateral adrenal glands are normal in appearance. Stomach/Bowel: Visualized portions are unremarkable. Vascular/Lymphatic: No aneurysm identified in the visualized abdominal  vasculature. No definite lymphadenopathy confidently identified in the abdomen. Other:  Trace volume of perihepatic ascites. Musculoskeletal: No aggressive appearing osseous lesions are noted in the visualized portions of the skeleton. IMPRESSION: 1. Large hypovascular mass in the region of the head of the pancreas  causing occlusion of the main pancreatic duct, highly concerning for primary pancreatic neoplasm. Numerous hypovascular lesions scattered throughout the liver, presumably reflective of widespread metastatic disease to the liver, with the largest lesion a dominant mass centered in the right lobe of the liver measuring up to 13.7 x 9.6 x 12.1 cm. 2. Trace volume of perihepatic ascites. 3. Fluid collection anterior to the mass adjacent to the head of the pancreas, presumably a proteinaceous/hemorrhagic pancreatic pseudocyst. 4. Small right pleural effusion. 5. Additional incidental findings, as above. Electronically Signed   By: Trudie Reed M.D.   On: 03/01/2023 05:35   CT Angio Chest PE W and/or Wo Contrast  Result Date: 02/27/2023 CLINICAL DATA:  Shortness of breath blood clots in leg EXAM: CT ANGIOGRAPHY CHEST WITH CONTRAST TECHNIQUE: Multidetector CT imaging of the chest was performed using the standard protocol during bolus administration of intravenous contrast. Multiplanar CT image reconstructions and MIPs were obtained to evaluate the vascular anatomy. RADIATION DOSE REDUCTION: This exam was performed according to the departmental dose-optimization program which includes automated exposure control, adjustment of the mA and/or kV according to patient size and/or use of iterative reconstruction technique. CONTRAST:  75mL OMNIPAQUE IOHEXOL 350 MG/ML SOLN COMPARISON:  Chest x-ray 02/27/2023 FINDINGS: Cardiovascular: Satisfactory opacification of the pulmonary arteries to the segmental level. Small nonocclusive segmental/subsegmental embolus within the left upper lobe, series 6, image 168. No  other discrete emboli are visualized. Mild atherosclerosis. No aneurysm or dissection. Normal cardiac size. Moderate to large circumferential pericardial effusion measuring up to 2.1 cm, slight increased density values suggesting complex fluid. Mediastinum/Nodes: Midline trachea. No thyroid mass. Subcarinal nodes measuring at least up to 2 cm. Esophagus within normal limits. Small clips in the posterior mediastinum. Lungs/Pleura: Small circumferential right pleural effusion. Calcifications or sutures at the posterior right lung base. Prominent peribronchovascular thickening within the right perihilar region and within the right middle and lower lobe bronchi. Multifocal bilateral ground-glass densities and areas of tree-in-bud density suspicious for respiratory infection Upper Abdomen: Large hypodense right hepatic mass suspected, this measures at least 12.6 cm. Another hypodense inferior right hepatic lobe lesion measuring 2.8 cm on series 4, image 153 incompletely assessed. Musculoskeletal: No acute osseous abnormality. Review of the MIP images confirms the above findings. IMPRESSION: 1. Positive for small nonocclusive segmental/subsegmental pulmonary embolus within the left upper lobe. 2. Moderate to large circumferential pericardial effusion measuring up to 2.1 cm, slight increased density values suggesting complex fluid. Recommend correlation with echocardiogram 3. Small circumferential right pleural effusion. Prominent peribronchovascular thickening within the right perihilar region and within the right upper, middle and lower lobe bronchi, with mild mucous plugging in the right lower lobe. Multifocal bilateral ground-glass densities and areas of tree-in-bud density suspicious for respiratory infection/pneumonia, possible atypical infection. 4. Subcarinal adenopathy, reactive versus metastatic. 5. Right hepatic masses measuring up to 12.6 cm. Findings are suspicious for metastatic disease versus liver  carcinoma. When the patient is clinically stable and able to follow directions and hold their breath (preferably as an outpatient) further evaluation with dedicated abdominal MRI should be considered. Critical Value/emergent results were called by telephone at the time of interpretation on 02/27/2023 at 11:02 pm to provider [facility] , who verbally acknowledged these results. Electronically Signed   By: Jasmine Pang M.D.   On: 02/27/2023 23:04   DG Chest 2 View  Result Date: 02/27/2023 CLINICAL DATA:  Shortness of breath EXAM: CHEST - 2 VIEW COMPARISON:  08/04/2013 FINDINGS: Left lung is grossly clear. Normal cardiac size. Small  loculated pleural effusion versus pleural thickening. Airspace disease at the right middle lobe and right base. No pneumothorax IMPRESSION: Airspace disease at the right middle lobe and right base, possible pneumonia. Small loculated pleural effusion versus pleural thickening. Electronically Signed   By: Jasmine Pang M.D.   On: 02/27/2023 22:45   US Venous Img Lower Bilateral (DVT)  Result Date: 02/26/2023 CLINICAL DATA:  Leg swelling for 2 weeks EXAM: BILATERAL LOWER EXTREMITY VENOUS DOPPLER ULTRASOUND TECHNIQUE: Gray-scale sonography with graded compression, as well as color Doppler and duplex ultrasound were performed to evaluate the lower extremity deep venous systems from the level of the common femoral vein and including the common femoral, femoral, profunda femoral, popliteal and calf veins including the posterior tibial, peroneal and gastrocnemius veins when visible. The superficial great saphenous vein was also interrogated. Spectral Doppler was utilized to evaluate flow at rest and with distal augmentation maneuvers in the common femoral, femoral and popliteal veins. COMPARISON:  None Available. FINDINGS: RIGHT LOWER EXTREMITY Common Femoral Vein: No evidence of thrombus. Normal compressibility, respiratory phasicity and response to augmentation. Saphenofemoral  Junction: No evidence of thrombus. Normal compressibility and flow on color Doppler imaging. Profunda Femoral Vein: No evidence of thrombus. Normal compressibility and flow on color Doppler imaging. Femoral Vein: Poor flow on Doppler. Poor compression with hypoechoic material. Popliteal Vein: Poor flow on Doppler with poor compression and luminal hypoechoic appearance Calf Veins: Areas of thrombus along the posterior tibial and peroneal vein. Superficial Great Saphenous Vein: No evidence of thrombus. Normal compressibility. Venous Reflux:  None. Other Findings:  None. LEFT LOWER EXTREMITY Common Femoral Vein: No evidence of thrombus. Normal compressibility, respiratory phasicity and response to augmentation. Saphenofemoral Junction: No evidence of thrombus. Normal compressibility and flow on color Doppler imaging. Profunda Femoral Vein: No evidence of thrombus. Normal compressibility and flow on color Doppler imaging. Femoral Vein: No evidence of thrombus. Normal compressibility, respiratory phasicity and response to augmentation. Popliteal Vein: Incomplete compression and poor flow on Doppler, color and spectral Calf Veins: Areas of thrombus in the peroneal and posterior tibial veins. Superficial Great Saphenous Vein: No evidence of thrombus. Normal compressibility. Venous Reflux:  None. Other Findings:  None. Critical Value/emergent results were called by telephone at the time of interpretation on 02/26/2023 at 6:04 pm to provider [facility] , who verbally acknowledged these results. IMPRESSION: Bilateral DVT. Right femoral vein down through the calf veins. Left popliteal and calf veins. Electronically Signed   By: Karen Kays M.D.   On: 02/26/2023 18:14    Labs:  CBC: Recent Labs    02/28/23 0517 03/01/23 0603 03/02/23 0235 03/03/23 0555  WBC 18.1* 20.1* 19.1* 18.6*  HGB 10.1* 10.0* 9.0* 9.2*  HCT 31.8* 30.9* 28.3* 29.0*  PLT 324 304 335 353    COAGS: Recent Labs    02/28/23 0058  02/28/23 1228 03/01/23 1305 03/01/23 1943 03/02/23 0235 03/03/23 0555  INR 2.5*  --   --   --   --  1.3*  APTT 74*   < > 70* 70* 75* 65*   < > = values in this interval not displayed.    BMP: Recent Labs    02/28/23 0517 03/01/23 0603 03/02/23 0235 03/03/23 0555  NA 135 137 135 138  K 4.0 3.8 3.8 3.6  CL 101 106 105 105  CO2 23 19* 22 24  GLUCOSE 254* 107* 315* 115*  BUN 16 11 13 12   CALCIUM 9.5 8.9 8.3* 8.8*  CREATININE 0.57* 0.45* 0.64 0.54*  GFRNONAA >  60 >60 >60 >60    LIVER FUNCTION TESTS: Recent Labs    02/24/23 1447  BILITOT 0.9  AST 18  ALT 17  ALKPHOS 277*  PROT 7.1  ALBUMIN 2.7*    TUMOR MARKERS: No results for input(s): "AFPTM", "CEA", "CA199", "CHROMGRNA" in the last 8760 hours.  Assessment and Plan:  62 y.o. male inpatient. History of DM, HTN, prostate cancer. Recently diagnosed with RLE DVT (on eliquis). Presented to the ED on 11.15.24 with [employer]. Found to have a PE, a large pericardial effusion and  PNA. Found to have an incidental right hepatic mass. CT Angio Chest from 11.14.24 reads Right hepatic masses measuring up to 12.6 cm. Findings are suspicious for metastatic disease versus liver carcinoma. When the patient is clinically stable and able to follow directions and hold their breath (preferably as an outpatient) further evaluation with dedicated abdominal MRI should be considered. MR Liver from Large hypovascular mass in the region of the head of the pancreas causing occlusion of the main pancreatic duct, highly concerning for primary pancreatic neoplasm. Numerous hypovascular lesions scattered throughout the liver, presumably reflective of widespread metastatic disease to the liver, with the largest lesion a dominant mass centered. Oncology has been consulted. Team is requesting a right hepatic mass biopsy for further evaluation of an aggressive malignancy.   Team instructed to:  Turn heparin gtt off at 9:30. Orders placed and RN made  aware.  Risks and benefits of liver mass biopsy was discussed with the patient and/or patient's family including, but not limited to bleeding, infection, damage to adjacent structures or low yield requiring additional tests.  All of the questions were answered and there is agreement to proceed.  Consent signed and in chart.   Thank you for this interesting consult.  I greatly enjoyed meeting Tony Benson and look forward to participating in their care.  A copy of this report was sent to the requesting provider on this date.  Electronically Signed: Alene Mires, NP 03/03/2023, 8:31 AM   I spent a total of 40 Minutes    in face to face in clinical consultation, greater than 50% of which was counseling/coordinating care for liver mass biopsy

## 2023-03-03 NOTE — Progress Notes (Signed)
Patient clinically stable post IR Liver biopsy per Dr Archer Asa, tolerated well. Vitals stable pre and post procedure. Received Versed 1 mg along with Fentanyl 50 mcg IV for procedure. Report given to care nurse post procedure/recovery/233. Update also given to patient's family in room at bedside post procedure.

## 2023-03-03 NOTE — Progress Notes (Signed)
Pharmacy was consulted to start post IR enoxaparin 1 mg/kg BID for treatment of acute PE and bilateral extremity DVTs.   Biopsy was completed at 1430 Plan to start Lovenox 6 hours post procedure @ 2100 Patient is "standard" bleeding risk post liver biopsy Dosing weight is 67.5 kg --> start Lovenox at 67.5 mg Every 12 hours  Thank you for allowing pharmacy to participate in this patient's care.  Effie Shy, PharmD Pharmacy Resident  03/03/2023 5:18 PM

## 2023-03-03 NOTE — Inpatient Diabetes Management (Signed)
Inpatient Diabetes Program Recommendations  AACE/ADA: New Consensus Statement on Inpatient Glycemic Control (2015)  Target Ranges:  Prepandial:   less than 140 mg/dL      Peak postprandial:   less than 180 mg/dL (1-2 hours)      Critically ill patients:  140 - 180 mg/dL   Lab Results  Component Value Date   GLUCAP 128 (H) 03/03/2023   HGBA1C 10.3 (H) 02/28/2023    Inpatient Diabetes Program Recommendations:   Spoke with pt about A1C results 10.3 (average blood glucose 249 over the past 2-3 months) with them and explained what an A1C is, basic pathophysiology of DM Type 2, basic home care, basic diabetes diet nutrition principles, importance of checking CBGs and maintaining good CBG control to prevent long-term and short-term complications. Reviewed signs and symptoms of hyperglycemia and hypoglycemia and how to treat hypoglycemia at home. Also reviewed blood sugar goals at home.  RNs to provide ongoing basic DM education at bedside with this patient.  Thank you, Tony Benson. Tony Nored, RN, MSN, CDCES  Diabetes Coordinator Inpatient Glycemic Control Team Team Pager [phone] (8am-5pm) 03/03/2023 11:49 AM

## 2023-03-03 NOTE — Progress Notes (Signed)
Pt unavailable Patient gone for biopsy.  Recommend switching to Lovenox at discharge 1 mg/kg twice a day.  GB

## 2023-03-03 NOTE — TOC Progression Note (Signed)
Transition of Care [facility]) - Progression Note    Patient Details  Name: Tony Benson MRN: [id number] Date of Birth: Mar 29, 1961  Transition [facility]) CM/SW Contact  Truddie Hidden, RN Phone Number: 03/03/2023, 2:54 PM  Clinical Narrative:    TOC continuing to follow patient's progress throughout discharge planning.        Expected Discharge Plan and Services                                               Social Determinants of Health (SDOH) Interventions SDOH Screenings   Food Insecurity: No Food Insecurity (02/28/2023)  Housing: Low Risk  (02/28/2023)  Transportation Needs: No Transportation Needs (02/28/2023)  Utilities: Not At Risk (02/28/2023)  Depression (PHQ2-9): Low Risk  (02/24/2023)  Tobacco Use: Low Risk  (02/28/2023)    Readmission Risk Interventions     No data to display

## 2023-03-03 NOTE — Progress Notes (Signed)
[facility] Progress Note  Patient: Tony Benson    ZOX:[id number]  DOA: 02/28/2023     Date of Service: the patient was seen and examined on 03/03/2023  Chief Complaint  Patient presents with   Shortness of Breath   Brief hospital course: 62 year old with history of asthma, OSA on CPAP, DM2, prostate cancer comes to the ED for worsening dyspnea.  Patient was recently diagnosed with right lower extremity DVT and placed on Eliquis.  Now CTA chest shows small nonocclusive left upper lobe PE with moderate to large circumferential pericardial effusion and right-sided pleural effusion with groundglass opacity.  Also found to have a right hepatic mass of 12.6 cm.  MRI liver eventually showed concern for pancreatic head mass with multiple liver lesions.  Discussed with oncology, ultrasound-guided biopsy ordered.  Pericardial effusion stable on echocardiogram, cardiology will be available if patient decompensates.     Assessment and Plan:  Principal Problem:   Acute pulmonary embolism (HCC) Active Problems:   Sepsis due to pneumonia (HCC)   Type 2 diabetes mellitus without complications (HCC)   Essential hypertension   Dyslipidemia   Asthma, chronic, unspecified asthma severity, with acute exacerbation   Acute pulmonary embolism without cor pulmonale Large Pericardial without tamponade Small rightpleural effusion Bilateral lower extremity DVTs Currently patient is on heparin drip while awaiting bx.  Eventually will transition to Lovenox 1mg /kg q12h. Lower extremity Dopplers on 11/13 already showed bilateral lower extremity DVTs. Echo shows preserved ef, large pericardial effusion without tamponade.  Discussed with Dr Mariah Milling (Cards will be available if medically becomes unstable) Repeat 2D echocardiogram in 1 to 2 days   Sepsis due to pneumonia Multifocal groundglass opacity in the lungs Possible concerns of sepsis. BNP = normal, ProCal 2.3 Continue azithromycin and Rocephin.    Sinus tachycardia - Combination of deconditioning, PE, tamponade, dehydration.  He is also quite anxious.  For now ordered Xanax,  S/p IV fluid bolus  Continue metoprolol twice daily.  Monitor BP and titrate medication accordingly  Pancreatic mass with multiple liver lesions - MRI performed which shows pancreatic head mass with multiple liver lesions.  This is highly concerning for aggressive malignancy. Discussed with Dr Donneta Romberg.   CA 19-9 pending,  IR consulted status post liver biopsy done on 11/18    Type 2 diabetes mellitus without complications  On Jardiance, holding metformin.   11/18 hypoglycemia episode due to n.p.o. status Monitor CBG, follow hypoglycemia protocol and continue NovoLog sliding scale.   Essential hypertension On Norvasc, losartan.  IV as needed   Chronic asthma Bronchodilators   Dyslipidemia Lipitor   History of prostate cancer status post prostatectomy - Follows oncology/urology with [facility]   Body mass index is 21.98 kg/m.  Nutrition Problem: Increased nutrient needs Etiology: chronic illness Interventions: Interventions: Ensure Enlive (each supplement provides 350kcal and 20 grams of protein), MVI   Diet: Carb modified diet DVT Prophylaxis: Subcutaneous Lovenox   Advance goals of care discussion: Full code  Family Communication: family was not present at bedside, at the time of interview.  The pt provided permission to discuss medical plan with the family. Opportunity was given to ask question and all questions were answered satisfactorily.   Disposition:  Pt is from Home, admitted with PE, pericardial effusion, pancreatic mass with liver mets, status post liver biopsy, s/p heparin IV infusion, transition to therapeutic Lovenox, still has pericardial effusion, may need pericardiocentesis, which precludes a safe discharge. Discharge to Home, when stable, may need few days to improve..  Subjective:  No significant events overnight, patient  has chronic right shoulder pain, mild shortness of breath, no chest pain or palpitations, denied any other complaints.  Physical Exam: General: NAD, lying comfortably Appear in no distress, affect appropriate Eyes: PERRLA ENT: Oral Mucosa Clear, moist  Neck: no JVD,  Cardiovascular: S1 and S2 Present, no Murmur,  Respiratory: good respiratory effort, Bilateral Air entry equal and Decreased, no Crackles, no wheezes Abdomen: Bowel Sound present, Soft and no tenderness,  Skin: no rashes Extremities: RLE edema 2-3+, no calf tenderness Neurologic: without any new focal findings Gait not checked due to patient safety concerns  Vitals:   03/03/23 1411 03/03/23 1415 03/03/23 1436 03/03/23 1450  BP: (!) 145/77 (!) 147/73 138/74 138/75  Pulse: (!) 122 (!) 118 (!) 114 (!) 116  Resp: (!) 25 (!) 24 (!) 22 (!) 24  Temp:      TempSrc:      SpO2: 99% 99% 99% 98%  Weight:      Height:        Intake/Output Summary (Last 24 hours) at 03/03/2023 1533 Last data filed at 03/03/2023 0447 Gross per 24 hour  Intake 586.99 ml  Output 800 ml  Net -213.01 ml   Filed Weights   02/27/23 2008 03/03/23 0434 03/03/23 1324  Weight: 68 kg 67.5 kg 67.5 kg    Data Reviewed: I have personally reviewed and interpreted daily labs, tele strips, imagings as discussed above. I reviewed all nursing notes, pharmacy notes, vitals, pertinent old records I have discussed plan of care as described above with RN and patient/family.  CBC: Recent Labs  Lab 02/27/23 2018 02/28/23 0517 03/01/23 0603 03/02/23 0235 03/03/23 0555  WBC 17.7* 18.1* 20.1* 19.1* 18.6*  HGB 11.2* 10.1* 10.0* 9.0* 9.2*  HCT 34.9* 31.8* 30.9* 28.3* 29.0*  MCV 85.7 90.6 85.8 85.8 86.8  PLT 369 324 304 335 353   Basic Metabolic Panel: Recent Labs  Lab 02/27/23 2018 02/28/23 0517 03/01/23 0603 03/02/23 0235 03/03/23 0555  NA 132* 135 137 135 138  K 4.2 4.0 3.8 3.8 3.6  CL 101 101 106 105 105  CO2 23 23 19* 22 24  GLUCOSE 301*  254* 107* 315* 115*  BUN 17 16 11 13 12   CREATININE 0.78 0.57* 0.45* 0.64 0.54*  CALCIUM 9.5 9.5 8.9 8.3* 8.8*  MG  --   --  1.9 1.9 2.1    Studies: IR US LIVER BIOPSY  Result Date: 03/03/2023 INDICATION: 62 year old male with pancreatic mass and concern for hepatic metastatic disease. EXAM: LIVER CORE BIOPSY MEDICATIONS: None. ANESTHESIA/SEDATION: Moderate (conscious) sedation was employed during this procedure. A total of Versed 1 mg and Fentanyl 50 mcg was administered intravenously. Moderate Sedation Time: 10 minutes. The patient's level of consciousness and vital signs were monitored continuously by radiology nursing throughout the procedure under my direct supervision. FLUOROSCOPY TIME:  None. COMPLICATIONS: None immediate. PROCEDURE: Informed written consent was obtained from the patient after a thorough discussion of the procedural risks, benefits and alternatives. All questions were addressed. Maximal Sterile Barrier Technique was utilized including caps, mask, sterile gowns, sterile gloves, sterile drape, hand hygiene and skin antiseptic. A timeout was performed prior to the initiation of the procedure. The right upper quadrant was interrogated with ultrasound. Multiple echogenic masses present throughout the liver. A suitable skin entry site was selected and marked. The region was sterilely prepped and draped in the standard fashion using chlorhexidine skin prep. Local anesthesia was attained by infiltration with 1% lidocaine. A small dermatotomy was  made. Under real-time sonographic guidance, a 17 gauge introducer needle was advanced into the margin of the mass. Multiple 18 gauge biopsies were then coaxially obtained using the BioPince automated biopsy device. Biopsy samples were placed in formalin and delivered to pathology for further analysis. As a 17 gauge introducer needle was removed, the biopsy tract was embolized with a Gel-Foam slurry. Post biopsy ultrasound imaging demonstrates no  evidence of active hemorrhage or perihepatic hematoma. The patient tolerated the procedure well. IMPRESSION: Technically successful ultrasound-guided core biopsy of hepatic lesion. Electronically Signed   By: Malachy Moan M.D.   On: 03/03/2023 14:59    Scheduled Meds:  amLODipine  10 mg Oral Daily   atorvastatin  10 mg Oral Daily   [START ON 03/04/2023] azithromycin  500 mg Oral Once   empagliflozin  25 mg Oral Daily   enoxaparin (LOVENOX) injection  1 mg/kg Subcutaneous Q12H   feeding supplement  237 mL Oral BID BM   guaiFENesin  600 mg Oral BID   insulin aspart  0-5 Units Subcutaneous QHS   insulin aspart  0-9 Units Subcutaneous TID WC   insulin aspart  3 Units Subcutaneous TID WC   insulin glargine-yfgn  30 Units Subcutaneous Daily   ipratropium  0.5 mg Nebulization BID   levalbuterol  1.25 mg Nebulization BID   losartan  100 mg Oral Daily   metoprolol tartrate  25 mg Oral BID   multivitamin with minerals  1 tablet Oral Daily   omega-3 acid ethyl esters  1,000 mg Oral Daily   Vitamin D (Ergocalciferol)  50,000 Units Oral Q7 days   Continuous Infusions:  cefTRIAXone (ROCEPHIN)  IV 2 g (03/03/23 0441)   PRN Meds: acetaminophen **OR** acetaminophen, ALPRAZolam, chlorpheniramine-HYDROcodone, hydrALAZINE, HYDROmorphone, ipratropium-albuterol, ketorolac, magnesium hydroxide, metoprolol tartrate, ondansetron **OR** ondansetron (ZOFRAN) IV, senna-docusate, traZODone  Time spent: 55 minutes  Author: Gillis Santa. MD [facility] 03/03/2023 3:33 PM  To reach On-call, see care teams to locate the attending and reach out to them via [email or web address]. If 7PM-7AM, please contact night-coverage If you still have difficulty reaching the attending provider, please page the [place] (Director on Call) for [facility] on amion for assistance.

## 2023-03-03 NOTE — Procedures (Signed)
Interventional Radiology Procedure Note  Procedure: US guided biopsy of liver mass  Complications: None  Estimated Blood Loss: None  Recommendations: - Bedrest x 2 hrs - Path sent   Signed,  Sterling Big, MD

## 2023-03-03 NOTE — Progress Notes (Signed)
Transition of Care [facility]) - Inpatient Brief Assessment   Patient Details  Name: GREGOREY FOSSE MRN: [id number] Date of Birth: 1960-07-03  Transition of Care [facility]) CM/SW Contact:    Truddie Hidden, RN Phone Number: 03/03/2023, 9:33 AM   Clinical Narrative: TOC continuing to follow patient's progress throughout discharge planning.   Transition of Care Asessment: Insurance and Status: Insurance coverage has been reviewed Patient has primary care physician: Yes Home environment has been reviewed: home Prior level of function:: independent Prior/Current Home Services: No current home services Social Determinants of Health Reivew: SDOH reviewed no interventions necessary Readmission risk has been reviewed: Yes Transition of care needs: no transition of care needs at this time

## 2023-03-04 ENCOUNTER — Telehealth: Payer: Self-pay | Admitting: *Deleted

## 2023-03-04 ENCOUNTER — Inpatient Hospital Stay (HOSPITAL_COMMUNITY)
Admit: 2023-03-04 | Discharge: 2023-03-04 | Disposition: A | Discharge status: Home / Self Care | Payer: BC Managed Care – PPO | Attending: Internal Medicine | Admitting: Internal Medicine

## 2023-03-04 DIAGNOSIS — A419 Sepsis, unspecified organism: Secondary | ICD-10-CM | POA: Diagnosis not present

## 2023-03-04 DIAGNOSIS — I3139 Other pericardial effusion (noninflammatory): Secondary | ICD-10-CM

## 2023-03-04 DIAGNOSIS — K8689 Other specified diseases of pancreas: Secondary | ICD-10-CM | POA: Diagnosis not present

## 2023-03-04 DIAGNOSIS — E43 Unspecified severe protein-calorie malnutrition: Secondary | ICD-10-CM | POA: Insufficient documentation

## 2023-03-04 DIAGNOSIS — Z7189 Other specified counseling: Secondary | ICD-10-CM

## 2023-03-04 DIAGNOSIS — J189 Pneumonia, unspecified organism: Secondary | ICD-10-CM | POA: Diagnosis not present

## 2023-03-04 DIAGNOSIS — I2699 Other pulmonary embolism without acute cor pulmonale: Secondary | ICD-10-CM | POA: Diagnosis not present

## 2023-03-04 LAB — ECHOCARDIOGRAM LIMITED
Est EF: >55
Height: 69 in
S' Lateral: 2.3 cm
Weight: 2380.97 [oz_av]

## 2023-03-04 LAB — GLUCOSE, CAPILLARY
Glucose-Capillary: 219 mg/dL — ABNORMAL HIGH (ref 70–99)
Glucose-Capillary: 233 mg/dL — ABNORMAL HIGH (ref 70–99)
Glucose-Capillary: 247 mg/dL — ABNORMAL HIGH (ref 70–99)
Glucose-Capillary: 303 mg/dL — ABNORMAL HIGH (ref 70–99)

## 2023-03-04 LAB — CBC
HCT: 32.6 % — ABNORMAL LOW (ref 39.0–52.0)
Hemoglobin: 10.3 g/dL — ABNORMAL LOW (ref 13.0–17.0)
MCH: 27.2 pg (ref 26.0–34.0)
MCHC: 31.6 g/dL (ref 30.0–36.0)
MCV: 86.2 fL (ref 80.0–100.0)
Platelets: 383 10*3/uL (ref 150–400)
RBC: 3.78 MIL/uL — ABNORMAL LOW (ref 4.22–5.81)
RDW: 13.8 % (ref 11.5–15.5)
WBC: 17.3 10*3/uL — ABNORMAL HIGH (ref 4.0–10.5)
nRBC: 0 % (ref 0.0–0.2)

## 2023-03-04 LAB — BASIC METABOLIC PANEL
Anion gap: 7 (ref 5–15)
BUN: 12 mg/dL (ref 8–23)
CO2: 24 mmol/L (ref 22–32)
Calcium: 8.6 mg/dL — ABNORMAL LOW (ref 8.9–10.3)
Chloride: 104 mmol/L (ref 98–111)
Creatinine, Ser: 0.68 mg/dL (ref 0.61–1.24)
GFR, Estimated: >60 mL/min (ref >60)
Glucose, Bld: 167 mg/dL — ABNORMAL HIGH (ref 70–99)
Potassium: 3.9 mmol/L (ref 3.5–5.1)
Sodium: 135 mmol/L (ref 135–145)

## 2023-03-04 LAB — MAGNESIUM: Magnesium: 2.1 mg/dL (ref 1.7–2.4)

## 2023-03-04 LAB — PHOSPHORUS: Phosphorus: 3.1 mg/dL (ref 2.5–4.6)

## 2023-03-04 LAB — SURGICAL PATHOLOGY

## 2023-03-04 MED ORDER — SODIUM CHLORIDE 0.9 % IV SOLN: INTRAVENOUS | Status: DC

## 2023-03-04 NOTE — Plan of Care (Signed)
  Problem: Metabolic: Goal: Ability to maintain appropriate glucose levels will improve 03/04/2023 0359 by Precious Bard, RN Outcome: Progressing 03/04/2023 0356 by Precious Bard, RN Outcome: Progressing   Problem: Pain Management: Goal: General experience of comfort will improve Outcome: Progressing   Problem: Safety: Goal: Ability to remain free from injury will improve Outcome: Progressing   Problem: Respiratory: Goal: Ability to maintain adequate ventilation will improve Outcome: Progressing   Problem: Consults Goal: Venous Thromboembolism Patient Education Description: See Patient Education Module for education specifics. Outcome: Progressing Goal: Diagnosis - Venous Thromboembolism (VTE) Description: Choose a selection Outcome: Progressing Note: PE (Pulmonary Embolism) Goal: Pharmacy Consult for anticoagulation Outcome: Progressing   Problem: Discharge Progression Outcomes Goal: Pain controlled with appropriate interventions Outcome: Progressing

## 2023-03-04 NOTE — Progress Notes (Signed)
*  PRELIMINARY RESULTS* Echocardiogram 2D Echocardiogram has been performed.  Tony Benson 03/04/2023, 9:41 AM

## 2023-03-04 NOTE — Consult Note (Addendum)
Consultation Note Date: 03/04/2023   Patient Name: Tony Benson  DOB: 03/24/1961  MRN: [id number]  Age / Sex: 62 y.o., male  PCP: Sherrie Mustache, MD Referring Physician: Marrion Coy, MD  Reason for Consultation: Establishing goals of care  HPI/Patient Profile: 62 year old with history of asthma, OSA on CPAP, DM2, prostate cancer comes to the ED for worsening dyspnea. Patient was recently diagnosed with right lower extremity DVT and placed on Eliquis. Now CTA chest shows small nonocclusive left upper lobe PE with moderate to large circumferential pericardial effusion and right-sided pleural effusion with groundglass opacity. Also found to have a right hepatic mass of 12.6 cm. MRI liver eventually showed concern for pancreatic head mass with multiple liver lesions. Discussed with oncology, ultrasound-guided biopsy ordered. Pericardial effusion stable on echocardiogram, cardiology will be available if patient decompensates.   Clinical Assessment and Goals of Care: Notes and labs reviewed. In to see patient. He is currently sitting in bed with his daughter at bedside.  He states he is okay with talking with her present. He states he is married and has 1 child. 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   Patient states he has felt strong and energetic prior to this admission; he states he is very active at [employer]. He discusses having chronic right shoulder pain for "a long time", perhaps years. He discusses the blood clot, and then coming in this admission to find out the other diagnoses and concerns. 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 Daughter became tearful and we discussed their visiting at this time, and our talking more tomorrow.     SUMMARY OF RECOMMENDATIONS   PMT will follow.   Prognosis:  Poor      Primary Diagnoses: Present on Admission:  Acute pulmonary embolism (HCC)   I have reviewed the  medical record, interviewed the patient and family, and examined the patient. The following aspects are pertinent.  Past Medical History:  Diagnosis Date   Asthma    Cancer (HCC)    prostate   Diabetes mellitus without complication (HCC)    type II   Hypertension    Sleep apnea    uses Cpap machine    Social History   Socioeconomic History   Marital status: Married    Spouse name: Not on file   Number of children: Not on file   Years of education: Not on file   Highest education level: Not on file  Occupational History   Occupation: Automotive engineer   Tobacco Use   Smoking status: Never   Smokeless tobacco: Never  Vaping Use   Vaping status: Never Used  Substance and Sexual Activity   Alcohol use: No   Drug use: No   Sexual activity: Not on file  Other Topics Concern   Not on file  Social History Narrative   Not on file   Social Determinants of Health   Financial Resource Strain: Not on file  Food Insecurity: No Food Insecurity (02/28/2023)   Hunger Vital Sign  Worried About Programme researcher, broadcasting/film/video in the Last Year: Never true    Ran Out of Food in the Last Year: Never true  Transportation Needs: No Transportation Needs (02/28/2023)   PRAPARE - Administrator, Civil Service (Medical): No    Lack of Transportation (Non-Medical): No  Physical Activity: Not on file  Stress: Not on file  Social Connections: Not on file   Family History  Problem Relation Age of Onset   Allergies Sister    Scheduled Meds:  amLODipine  10 mg Oral Daily   atorvastatin  10 mg Oral Daily   empagliflozin  25 mg Oral Daily   enoxaparin (LOVENOX) injection  1 mg/kg Subcutaneous Q12H   feeding supplement  237 mL Oral BID BM   guaiFENesin  600 mg Oral BID   insulin aspart  0-5 Units Subcutaneous QHS   insulin aspart  0-9 Units Subcutaneous TID WC   insulin aspart  3 Units Subcutaneous TID WC   insulin glargine-yfgn  30 Units Subcutaneous Daily   ipratropium  0.5 mg  Nebulization BID   levalbuterol  1.25 mg Nebulization BID   losartan  100 mg Oral Daily   metoprolol tartrate  25 mg Oral BID   multivitamin with minerals  1 tablet Oral Daily   omega-3 acid ethyl esters  1,000 mg Oral Daily   Vitamin D (Ergocalciferol)  50,000 Units Oral Q7 days   Continuous Infusions: PRN Meds:.acetaminophen **OR** acetaminophen, ALPRAZolam, chlorpheniramine-HYDROcodone, hydrALAZINE, HYDROmorphone, ipratropium-albuterol, ketorolac, magnesium hydroxide, metoprolol tartrate, ondansetron **OR** ondansetron (ZOFRAN) IV, senna-docusate, traZODone Medications Prior to Admission:  Prior to Admission medications   Medication Sig Start Date End Date Taking? Authorizing Provider  amLODipine (NORVASC) 10 MG tablet Take 10 mg by mouth daily. 09/10/17  Yes [provider]  APIXABAN Everlene Balls) VTE STARTER PACK (10MG  AND 5MG ) Take as directed on package: start with two-5mg  tablets twice daily for 7 days. On day 8, switch to one-5mg  tablet twice daily. 02/26/23  Yes Earna Coder, MD  atorvastatin (LIPITOR) 10 MG tablet Take 10 mg by mouth daily.   Yes [provider]  JARDIANCE 25 MG TABS tablet Take 25 mg by mouth daily. 12/23/22  Yes [provider]  losartan (COZAAR) 100 MG tablet Take 100 mg by mouth daily. 08/15/17  Yes [provider]  metFORMIN (GLUCOPHAGE) 1000 MG tablet Take 1,000 mg by mouth 2 (two) times daily with a meal.   Yes [provider]  mometasone-formoterol (DULERA) 100-5 MCG/ACT AERO Inhale 2 puffs into the lungs 2 (two) times daily as needed for wheezing or shortness of breath.   Yes [provider]  Omega-3 Fatty Acids (FISH OIL) 1000 MG CAPS Take 1,000 mg by mouth daily.   Yes [provider]  RYBELSUS 7 MG TABS PLEASE SEE ATTACHED FOR DETAILED DIRECTIONS 12/21/22  Yes [provider]  TOUJEO SOLOSTAR 300 UNIT/ML Solostar Pen SMARTSIG:25 Unit(s) SUB-Q Daily 12/02/22  Yes [provider]   potassium chloride SA (KLOR-CON M20) 20 MEQ tablet Take by mouth. Patient not taking: Reported on 02/24/2023 09/15/20   [provider]  senna-docusate (SENOKOT-S) 8.6-50 MG tablet Take by mouth. Patient not taking: Reported on 02/28/2023 11/16/21   [provider]  sildenafil (VIAGRA) 50 MG tablet Take 50 mg by mouth every 3 (three) days. Patient not taking: Reported on 02/28/2023    [provider]  Vitamin D, Ergocalciferol, (DRISDOL) 50000 units CAPS capsule Take 50,000 Units by mouth every 7 (seven) days.  [provider]   Allergies  Allergen Reactions   Other Anaphylaxis    peanuts   Morphine And Codeine Itching   Tree Extract Other (See Comments)    ALMONDS  ---- EYE/ FACIAL SWELLING   Wound Dressing Adhesive Rash   Review of Systems  Musculoskeletal:        Chronic right shoulder pain    Physical Exam Pulmonary:     Effort: Pulmonary effort is normal.  Neurological:     Mental Status: He is alert.     Vital Signs: BP 117/60 (BP Location: Left Arm)   Pulse (!) 112   Temp 98.8 F (37.1 C)   Resp (!) 21   Ht 5\' 9"  (1.753 m)   Wt 67.5 kg   SpO2 95%   BMI 21.98 kg/m  Pain Scale: 0-10   Pain Score: Asleep   SpO2: SpO2: 95 % O2 Device:SpO2: 95 % O2 Flow Rate: .O2 Flow Rate (L/min): 2 L/min  IO: Intake/output summary:  Intake/Output Summary (Last 24 hours) at 03/04/2023 1224 Last data filed at 03/04/2023 0550 Gross per 24 hour  Intake 616.67 ml  Output 500 ml  Net 116.67 ml    LBM: Last BM Date : 03/03/23 Baseline Weight: Weight: 68 kg Most recent weight: Weight: 67.5 kg      Signed by: Morton Stall, NP   Please contact Palliative Medicine Team phone at [phone] for questions and concerns.  For individual provider: See Loretha Stapler

## 2023-03-04 NOTE — Progress Notes (Signed)
Nutrition Follow-up  DOCUMENTATION CODES:   Severe malnutrition in context of chronic illness  INTERVENTION:   -Continue Ensure Enlive po BID, each supplement provides 350 kcal and 20 grams of protein.  -MVI with minerals daily -Magic cup TID with meals, each supplement provides 290 kcal and 9 grams of protein  -Continue liberalized diet of regular  -RD provided education on high protein, high calorie diet; provided "High-Calorie High-Protein Nutrition Therapy" handout from the Academy of Nutrition and Dietetics- attached to AVS/ discharge summary   NUTRITION DIAGNOSIS:   Severe Malnutrition related to chronic illness (pancreatic mass with multiple liver lesions) as evidenced by moderate fat depletion, severe fat depletion, moderate muscle depletion, severe muscle depletion.  Ongoing  GOAL:   Patient will meet greater than or equal to 90% of their needs  Progressing   MONITOR:   PO intake, Supplement acceptance  REASON FOR ASSESSMENT:   Consult Assessment of nutrition requirement/status  ASSESSMENT:   62 y.o. admitted for worsening dyspnea. PMH; asthma, OSA on CPAP, DM2 prostate cancer, HTN.  11/18- s/p US guided biopsy of liver mass   Reviewed I/O's: +117 ml x 24 hours and -179 ml since admission  UOP: 500 ml x 24 hours  Spoke with pt and wife at bedside, who were pleasant and in good spirits today. Pt reports feeling better today. Pt shares that he has had a declining appetite over the past 3 weeks secondary to taste changes ("nothing tastes like anything"). Pt reports he has not enjoyment with eating because of this. He has not eaten breakfast, but is enjoying the Ensure supplements. PTA, he was drinking [employer]life supplements 1-2 times per day.   Pt shares that his UBW is around 220# and estimates he has lost about 30# within the past 3 weeks. Reviewed wt hx; pt has experienced a 1.5% wt loss over the past month, which is significant for time frame.   Pt currently  on a regular diet. Pt reports that his blood sugars fluctuate at baseline. He reports interest in getting stronger and regaining lost weight. Discussed importance of good meal and supplement intake to promote healing. He is amenable to continue supplements.   RD provided "High-Calorie High-Protein Nutrition Therapy" handout from the Academy of [employer] and Dietetics. Reviewed patient's dietary recall. Provided examples on ways to increase caloric density of foods and beverages frequently consumed by the patient. Also provided ideas to promote variety and to incorporate additional nutrient dense foods into patient's diet. Discussed eating small frequent meals and snacks to assist in increasing overall po intake. Teach back method used. Expect fair compliance.  Palliative care following based on goals of care.    Medications reviewed and include jardiance, lovenox, lovaza, and vitamin D.   Lab Results  Component Value Date   HGBA1C 10.3 (H) 02/28/2023   PTA DM medications are 25 mg jardiance daily, 1000 mg metformin BID, and 25 units tuojeo daily.   Labs reviewed: CBGS: 85-331 (inpatient orders for glycemic control are 0-9 units insulin aspart daily at bedtime, 0-9 units insulin aspart TID with meals, 3 units insulin aspart TID with meals, and 30 units insulin glargine-yfgn daily).    NUTRITION - FOCUSED PHYSICAL EXAM:  Flowsheet Row Most Recent Value  Orbital Region Moderate depletion  Upper Arm Region Severe depletion  Thoracic and Lumbar Region Moderate depletion  Buccal Region Severe depletion  Temple Region Severe depletion  Clavicle Bone Region Severe depletion  Clavicle and Acromion Bone Region Severe depletion  Scapular Bone Region Severe  depletion  Dorsal Hand Moderate depletion  Patellar Region Moderate depletion  Anterior Thigh Region Moderate depletion  Posterior Calf Region Moderate depletion  Edema (RD Assessment) None  Hair Reviewed  Eyes Reviewed  Mouth Reviewed  Skin  Reviewed  Nails Reviewed       Diet Order:   Diet Order             Diet regular Room service appropriate? Yes; Fluid consistency: Thin  Diet effective now                   EDUCATION NEEDS:   Education needs have been addressed  Skin:  Skin Assessment: Reviewed RN Assessment  Last BM:  03/03/23  Height:   Ht Readings from Last 1 Encounters:  03/03/23 5\' 9"  (1.753 m)    Weight:   Wt Readings from Last 1 Encounters:  03/03/23 67.5 kg    Ideal Body Weight:  72.7 kg  BMI:  Body mass index is 21.98 kg/m.  Estimated Nutritional Needs:   Kcal:  2000-2200  Protein:  105-120 grams  Fluid:  > 2 L    Levada Schilling, RD, LDN, CDCES Registered Dietitian III Certified Diabetes Care and Education Specialist Please refer to [facility] for RD and/or RD on-call/weekend/after hours pager

## 2023-03-04 NOTE — Telephone Encounter (Signed)
Received Short Term Disability form for patient no DOB on form, I had to call insurance carrier to get DOB with the provided claim number. Form completed and set for physician signature

## 2023-03-04 NOTE — Progress Notes (Addendum)
Progress Note   Patient: Tony Benson:[id number] DOB: 06-13-1960 DOA: 02/28/2023     4 DOS: the patient was seen and examined on 03/04/2023   Brief hospital course: 62 year old with history of asthma, OSA on CPAP, DM2, prostate cancer comes to the ED for worsening dyspnea. Patient was recently diagnosed with right lower extremity DVT and placed on Eliquis. CTA chest shows small nonocclusive left upper lobe PE with moderate to large circumferential pericardial effusion and right-sided pleural effusion with groundglass opacity. Also found to have a right hepatic mass of 12.6 cm. MRI liver eventually showed concern for pancreatic head mass with multiple liver lesions.  Patient had ultrasound-guided liver biopsy on 11/18, pathology is pending.     Principal Problem:   Acute pulmonary embolism (HCC) Active Problems:   Sepsis due to pneumonia (HCC)   Type 2 diabetes mellitus without complications (HCC)   Essential hypertension   Dyslipidemia   Asthma, chronic, unspecified asthma severity, with acute exacerbation   Mass of head of pancreas   Protein-calorie malnutrition, severe   Assessment and Plan:   Acute pulmonary embolism without cor pulmonale Bilateral lower extremity DVTs Patient initially treated with IV heparin prior to liver biopsy.  Then changed to Lovenox.  Patient will be continued on Lovenox at the time of discharge.   Sepsis due to pneumonia Patient meets sepsis criteria with fever 100.7, leukocytosis and tachypnea Multifocal groundglass opacity in the lungs Procalcitonin level 2.3. Patient has completed 5 days antibiotics.   Pancreatic mass with multiple liver lesions Large Pericardial without tamponade Small rightpleural effusion - MRI performed which shows pancreatic head mass with multiple liver lesions.   IR consulted status post liver biopsy done on 11/18.  Pending biopsy results. Repeated echocardiogram showed no evidence of cardiac tamponade.  However,  per oncology request, cardiology consult is obtained. Patient pleural effusion and pericardial effusion most likely due to malignancy.  Addendum: Pathology reviewed metastatic carcinoma, nonspecific pattern, pancreatic cancer is favored.     Type 2 diabetes mellitus without complications  Continue sliding scale insulin.   Essential hypertension On Norvasc, losartan.  IV as needed   Chronic asthma Bronchodilators   Dyslipidemia Lipitor   History of prostate cancer status post prostatectomy - Follows oncology/urology with [facility]     Subjective:  Patient denies any short of breath or cough.  Physical Exam: Vitals:   03/04/23 0345 03/04/23 0755 03/04/23 0810 03/04/23 1226  BP: 139/77  117/60 118/65  Pulse: (!) 105  (!) 112 (!) 110  Resp:   (!) 21   Temp:   98.8 F (37.1 C) (!) 100.7 F (38.2 C)  TempSrc:    Oral  SpO2: 95% 95% 95% 97%  Weight:      Height:       General exam: Appears calm and comfortable,.  Malnourished. Respiratory system: Clear to auscultation. Respiratory effort normal. Cardiovascular system: Very distant heart sound.Marland Kitchen No JVD, murmurs, rubs, gallops or clicks. No pedal edema. Gastrointestinal system: Abdomen is nondistended, soft and nontender.  Significant hepatomegaly.. Normal bowel sounds heard. Central nervous system: Alert and oriented. No focal neurological deficits. Extremities: Symmetric 5 x 5 power. Skin: No rashes, lesions or ulcers Psychiatry: Judgement and insight appear normal. Mood & affect appropriate.    Data Reviewed:  Reviewed MRI results, CT results.  Lab results.  Family Communication: Daughter updated at the bedside.  Disposition: Status is: Inpatient Remains inpatient appropriate because: Severity of disease.     Time spent: 50 minutes  Author: Freda Munro  Chipper Herb, MD 03/04/2023 3:07 PM  For on call review [email or web address].

## 2023-03-04 NOTE — Progress Notes (Signed)
Tony Benson   DOB:02-26-1961   WU#:[id number]    Subjective: Patient s/p biopsy of the liver on 11/18-results pending.  Patient states that he is "fine".  Appetite improving as per patient.  His daughter by the bedside.   Objective:  Vitals:   03/04/23 0810 03/04/23 1226  BP: 117/60 118/65  Pulse: (!) 112 (!) 110  Resp: (!) 21   Temp: 98.8 F (37.1 C) (!) 100.7 F (38.2 C)  SpO2: 95% 97%     Intake/Output Summary (Last 24 hours) at 03/04/2023 1349 Last data filed at 03/04/2023 0550 Gross per 24 hour  Intake 616.67 ml  Output 500 ml  Net 116.67 ml    Physical Exam Vitals and nursing note reviewed.  Constitutional:      Comments:     HENT:     Head: Normocephalic and atraumatic.     Mouth/Throat:     Mouth: Mucous membranes are moist.     Pharynx: No oropharyngeal exudate.  Eyes:     Pupils: Pupils are equal, round, and reactive to light.  Cardiovascular:     Rate and Rhythm: Regular rhythm. Tachycardia present.  Pulmonary:     Effort: No respiratory distress.     Breath sounds: No wheezing.     Comments: Decreased breath sounds bilaterally at bases.  No wheeze or crackles Abdominal:     General: Bowel sounds are normal. There is no distension.     Palpations: Abdomen is soft. There is no mass.     Tenderness: There is no abdominal tenderness. There is no guarding or rebound.  Musculoskeletal:        General: No tenderness. Normal range of motion.     Cervical back: Normal range of motion and neck supple.  Skin:    General: Skin is warm.  Neurological:     Mental Status: He is alert and oriented to person, place, and time.  Psychiatric:        Mood and Affect: Affect normal.        Judgment: Judgment normal.      Labs:  Lab Results  Component Value Date   WBC 17.3 (H) 03/04/2023   HGB 10.3 (L) 03/04/2023   HCT 32.6 (L) 03/04/2023   MCV 86.2 03/04/2023   PLT 383 03/04/2023   NEUTROABS 16.5 (H) 02/24/2023    Lab Results  Component Value Date   NA  135 03/04/2023   K 3.9 03/04/2023   CL 104 03/04/2023   CO2 24 03/04/2023    Studies:  ECHOCARDIOGRAM LIMITED  Result Date: 03/04/2023    ECHOCARDIOGRAM LIMITED REPORT   Patient Name:   Tony Benson Date of Exam: 03/04/2023 Medical Rec #:  [id number]     Height:       69.0 in Accession #:    [id number]    Weight:       148.8 lb Date of Birth:  1960-10-31    BSA:          1.822 m Patient Age:    61 years      BP:           117/60 mmHg Patient Gender: M             HR:           112 bpm. Exam Location:  [facility] Procedure: Limited Echo, Color Doppler and Cardiac Doppler Indications:     Pericardial Effusion I31.3  History:         Patient  has prior history of Echocardiogram examinations, most                  recent 03/01/2023. Risk Factors:Diabetes, Hypertension and                  Sleep Apnea.  Sonographer:     Cristela Blue Referring Phys:  [id number] Marrion Coy Diagnosing Phys: Yvonne Kendall MD IMPRESSIONS  1. Left ventricular ejection fraction, by estimation, is >55%. The left ventricle has normal function. The left ventricle has no regional wall motion abnormalities.  2. Right ventricular systolic function is normal. The right ventricular size is normal.  3. Moderate pericardial effusion. The pericardial effusion is circumferential. There is no evidence of cardiac tamponade.  4. The mitral valve is normal in structure. Comparison(s): A prior study was performed on 03/01/2023. Pericardial effusion appears slightly smaller. FINDINGS  Left Ventricle: Left ventricular ejection fraction, by estimation, is >55%. The left ventricle has normal function. The left ventricle has no regional wall motion abnormalities. The left ventricular internal cavity size was normal in size. There is borderline left ventricular hypertrophy. Right Ventricle: The right ventricular size is normal. No increase in right ventricular wall thickness. Right ventricular systolic function is normal. Pericardium: A moderately sized pericardial  effusion is present. The pericardial effusion is circumferential. There is no evidence of cardiac tamponade. Mitral Valve: The mitral valve is normal in structure. Tricuspid Valve: The tricuspid valve is grossly normal. Tricuspid valve regurgitation is mild. Aorta: The aortic root is normal in size and structure. Venous: The inferior vena cava was not well visualized. Additional Comments: Spectral Doppler performed. Color Doppler performed.  LEFT VENTRICLE PLAX 2D LVIDd:         4.30 cm LVIDs:         2.30 cm LV PW:         1.12 cm LV IVS:        0.95 cm   AORTA Ao Root diam: 3.30 cm Yvonne Kendall MD Electronically signed by Yvonne Kendall MD Signature Date/Time: 03/04/2023/11:43:49 AM    Final    IR US LIVER BIOPSY  Result Date: 03/03/2023 INDICATION: 62 year old male with pancreatic mass and concern for hepatic metastatic disease. EXAM: LIVER CORE BIOPSY MEDICATIONS: None. ANESTHESIA/SEDATION: Moderate (conscious) sedation was employed during this procedure. A total of Versed 1 mg and Fentanyl 50 mcg was administered intravenously. Moderate Sedation Time: 10 minutes. The patient's level of consciousness and vital signs were monitored continuously by radiology nursing throughout the procedure under my direct supervision. FLUOROSCOPY TIME:  None. COMPLICATIONS: None immediate. PROCEDURE: Informed written consent was obtained from the patient after a thorough discussion of the procedural risks, benefits and alternatives. All questions were addressed. Maximal Sterile Barrier Technique was utilized including caps, mask, sterile gowns, sterile gloves, sterile drape, hand hygiene and skin antiseptic. A timeout was performed prior to the initiation of the procedure. The right upper quadrant was interrogated with ultrasound. Multiple echogenic masses present throughout the liver. A suitable skin entry site was selected and marked. The region was sterilely prepped and draped in the standard fashion using  chlorhexidine skin prep. Local anesthesia was attained by infiltration with 1% lidocaine. A small dermatotomy was made. Under real-time sonographic guidance, a 17 gauge introducer needle was advanced into the margin of the mass. Multiple 18 gauge biopsies were then coaxially obtained using the BioPince automated biopsy device. Biopsy samples were placed in formalin and delivered to pathology for further analysis. As a 17 gauge introducer needle was  removed, the biopsy tract was embolized with a Gel-Foam slurry. Post biopsy ultrasound imaging demonstrates no evidence of active hemorrhage or perihepatic hematoma. The patient tolerated the procedure well. IMPRESSION: Technically successful ultrasound-guided core biopsy of hepatic lesion. Electronically Signed   By: Malachy Moan M.D.   On: 03/03/2023 14:59    62 y.o. male patient with a history of diabetes currently admitted to hospital for worsening shortness of breath/acute PE.  Abdomen pelvis CT scan/liver MRI -noted to have  pancreatic head mass/liver mass   # Pancreatic head mass-with multiple-liver masses- highly suspicious for malignancy-status post biopsy on 11/18.  Pathology pending.  CA 19-9-normal; CEA elevated-see discussion below   # Acute PE/bilateral DVT-on IV heparin-recommend discharge on Lovenox 1 mg SQ twice a day   # Large pericardial effusion noted-no evidence of tamponade. [On echo/CT scan]-given the ongoing tachycardia -recommend cardiology evaluation-for consideration of pericardiocentesis.  # Status post palliative care evaluation-    Prognosis/plan: Pending pathology results-imaging concerning for stage IV cancer.  Discussed the goal of treatment would be palliative and not curative.  Patient understands that given his significant clinical decompensation over the last few weeks-would most likely adversely affect his response to therapy and ability to withstand side effects.  Patient is inclined/interested in therapy at this  time.  Will continue to assess the patient.  Discussed with hospitalist service, Dr.Zhang and Dr.End, cardiology.   Earna Coder, MD 03/04/2023  1:49 PM

## 2023-03-04 NOTE — Discharge Instructions (Addendum)

## 2023-03-04 NOTE — Consult Note (Signed)
Cardiology Consultation   Patient ID: Tony Benson MRN: [id number]; DOB: 01-Nov-1960  Admit date: 02/28/2023 Date of Consult: 03/04/2023  PCP:  Sherrie Mustache, MD   [facility] Providers Cardiologist:  New - Ethanjames Fontenot     Patient Profile:   Tony Benson is a 62 y.o. male with a hx of hypertension, type 2 diabetes mellitus, obstructive sleep apnea, and recently diagnosed LE DVT, who is being seen 03/04/2023 for the evaluation of pericardial effusion at the request of Dr. Chipper Herb.  History of Present Illness:   Mr. Tony Benson developed swelling of the left foot about 2 weeks ago.  He thought this was due to excess salt intake and tried modify his diet and elevate the leg.  The next day, he noticed swelling of the right leg as well.  He mentioned this when he saw Dr. Donneta Benson in the cancer center last week (consult originally placed for evaluation of abnormal weight loss and elevated vitamin B12).  Lower extremity venous duplex 2 days later revealed bilateral lower extremity DVT's for which Mr. Tony Benson was started on apixaban.  Two days later, he presented to the [facility] emergency department with shortness of breath.  He states that he didn't feel that bad but that his daughter, who is a Engineer, civil (consulting) at [employer], was concerned about his appearance.  In the ED, CTA chest showed small bilateral pulmonary emboli, moderate to large pericardial effusion, and right hepatic mass suspicious for malignancy.  Subsequent MRI of the abdomen showed pancreatic mass concerning for pancreatic neoplasm with numerous liver lesions suspicious for metastases.  Liver mass biopsy was performed yesterday (pathology results pending).  Echocardiogram on 03/01/2023 showed a large circumferential pericardial effusion without evidence of tamponade physiology.  LVEF was normal.  PA pressure was moderately elevated with normal CVP.  Repeat echo today showed slight interval decrease in size of pericardial effusion (now moderate in size)  without findings of tamponade physiology.  Mr. Tony Benson only complaint at this time is of pain in the right shoulder, which is chronic for him.  He denies chest pain, shortness of breath, palpitations, and lightheadedness.  His right calf remains swollen though the left calf has returned to normal.   Past Medical History:  Diagnosis Date   Asthma    Cancer (HCC)    prostate   Diabetes mellitus without complication (HCC)    type II   Hypertension    Sleep apnea    uses Cpap machine     Past Surgical History:  Procedure Laterality Date   CHOLECYSTECTOMY     COLONOSCOPY WITH PROPOFOL N/A 11/18/2018   Procedure: COLONOSCOPY WITH PROPOFOL;  Surgeon: Pasty Spillers, MD;  Location: [facility] ENDOSCOPY;  Service: Gastroenterology;  Laterality: N/A;   COLONOSCOPY WITH PROPOFOL N/A 02/05/2023   Procedure: COLONOSCOPY WITH PROPOFOL;  Surgeon: Toney Reil, MD;  Location: [facility] ENDOSCOPY;  Service: Gastroenterology;  Laterality: N/A;   IR US LIVER BIOPSY  03/03/2023   LAPAROSCOPIC RETROPUBIC PROSTATECTOMY     2023   LUNG SURGERY     "ligation of thoracic duct"   PROSTATE BIOPSY N/A 09/21/2020   Procedure: PROSTATE BIOPSY Tony Benson;  Surgeon: [name], MD;  Location: [facility] ORS;  Service: Urology;  Laterality: N/A;      Inpatient Medications: Scheduled Meds:  amLODipine  10 mg Oral Daily   atorvastatin  10 mg Oral Daily   empagliflozin  25 mg Oral Daily   enoxaparin (LOVENOX) injection  1 mg/kg Subcutaneous Q12H   feeding  supplement  237 mL Oral BID BM   guaiFENesin  600 mg Oral BID   insulin aspart  0-5 Units Subcutaneous QHS   insulin aspart  0-9 Units Subcutaneous TID WC   insulin aspart  3 Units Subcutaneous TID WC   insulin glargine-yfgn  30 Units Subcutaneous Daily   ipratropium  0.5 mg Nebulization BID   levalbuterol  1.25 mg Nebulization BID   losartan  100 mg Oral Daily   metoprolol tartrate  25 mg Oral BID   multivitamin with minerals  1 tablet Oral Daily    omega-3 acid ethyl esters  1,000 mg Oral Daily   Vitamin D (Ergocalciferol)  50,000 Units Oral Q7 days   Continuous Infusions:  PRN Meds: acetaminophen **OR** acetaminophen, ALPRAZolam, chlorpheniramine-HYDROcodone, hydrALAZINE, HYDROmorphone, ipratropium-albuterol, ketorolac, magnesium hydroxide, metoprolol tartrate, ondansetron **OR** ondansetron (ZOFRAN) IV, senna-docusate, traZODone  Allergies:    Allergies  Allergen Reactions   Other Anaphylaxis    peanuts   Morphine And Codeine Itching   Tree Extract Other (See Comments)    ALMONDS  ---- EYE/ FACIAL SWELLING   Wound Dressing Adhesive Rash    Social History:   Social History   Tobacco Use   Smoking status: Never   Smokeless tobacco: Never  Vaping Use   Vaping status: Never Used  Substance Use Topics   Alcohol use: No   Drug use: No    Family History:   Family History  Problem Relation Age of Onset   Allergies Sister     ROS:  [name] reports 80 pound weight loss since the start of the year (initially this was intentional, though he was not planning to lose this much weight).  Physical Exam/Data:   Vitals:   03/04/23 0345 03/04/23 0755 03/04/23 0810 03/04/23 1226  BP: 139/77  117/60 118/65  Pulse: (!) 105  (!) 112 (!) 110  Resp:   (!) 21   Temp:   98.8 F (37.1 C) (!) 100.7 F (38.2 C)  TempSrc:    Oral  SpO2: 95% 95% 95% 97%  Weight:      Height:        Intake/Output Summary (Last 24 hours) at 03/04/2023 1525 Last data filed at 03/04/2023 0550 Gross per 24 hour  Intake 616.67 ml  Output 500 ml  Net 116.67 ml      03/03/2023    1:24 PM 03/03/2023    4:34 AM 02/27/2023    8:08 PM  Last 3 Weights  Weight (lbs) 148 lb 13 oz 148 lb 13 oz 149 lb 14.6 oz  Weight (kg) 67.5 kg 67.5 kg 68 kg     Body mass index is 21.98 kg/m.  General:  Thin man lying in bed.  His daughter is at the bedside. HEENT: normal Neck: no JVD Vascular: No carotid bruits; Distal pulses 2+ bilaterally Cardiac:   Tachycardic but regular without murmurs, rubs, or gallops. Lungs:  Mildly diminished breath sounds without wheezes or crackles. Abd: soft, nontender, no hepatomegaly  Ext: 1+ edema of the right calf.  Left calf without significant swelling. Musculoskeletal:  No deformities, BUE and BLE strength normal and equal Skin: warm and dry  Neuro:  CNs 2-12 intact, no focal abnormalities noted Psych:  Normal affect   EKG:  The EKG done 02/27/2023 was personally reviewed and demonstrates:  Sinus tachycardia with baseline artifact and nonspecific T wave changes. Telemetry:  Telemetry was personally reviewed and demonstrates:  Normal sinus rhythm and sinus tachycardia.  Relevant CV Studies: See echo  below.  Laboratory Data:  High Sensitivity Troponin:   Recent Labs  Lab 02/27/23 0058 02/27/23 2018  TROPONINIHS 11 9     Chemistry Recent Labs  Lab 03/02/23 0235 03/03/23 0555 03/04/23 0420  NA 135 138 135  K 3.8 3.6 3.9  CL 105 105 104  CO2 22 24 24   GLUCOSE 315* 115* 167*  BUN 13 12 12   CREATININE 0.64 0.54* 0.68  CALCIUM 8.3* 8.8* 8.6*  MG 1.9 2.1 2.1  GFRNONAA >60 >60 >60  ANIONGAP 8 9 7     No results for input(s): "PROT", "ALBUMIN", "AST", "ALT", "ALKPHOS", "BILITOT" in the last 168 hours. Lipids No results for input(s): "CHOL", "TRIG", "HDL", "LABVLDL", "LDLCALC", "CHOLHDL" in the last 168 hours.  Hematology Recent Labs  Lab 03/02/23 0235 03/03/23 0555 03/04/23 0420  WBC 19.1* 18.6* 17.3*  RBC 3.30* 3.34* 3.78*  HGB 9.0* 9.2* 10.3*  HCT 28.3* 29.0* 32.6*  MCV 85.8 86.8 86.2  MCH 27.3 27.5 27.2  MCHC 31.8 31.7 31.6  RDW 13.5 13.6 13.8  PLT 335 353 383   Thyroid No results for input(s): "TSH", "FREET4" in the last 168 hours.  BNP Recent Labs  Lab 02/28/23 1917  BNP 54.4    DDimer No results for input(s): "DDIMER" in the last 168 hours.   Radiology/Studies:  ECHOCARDIOGRAM LIMITED  Result Date: 03/04/2023    ECHOCARDIOGRAM LIMITED REPORT   Patient Name:    ALOK MEZZANOTTE Date of Exam: 03/04/2023 Medical Rec #:  [id number]     Height:       69.0 in Accession #:    [id number]    Weight:       148.8 lb Date of Birth:  [birth year]    BSA:          1.822 m Patient Age:    61 years      BP:           117/60 mmHg Patient Gender: M             HR:           112 bpm. Exam Location:  [facility] Procedure: Limited Echo, Color Doppler and Cardiac Doppler Indications:     Pericardial Effusion I31.3  History:         Patient has prior history of Echocardiogram examinations, most                  recent 03/01/2023. Risk Factors:Diabetes, Hypertension and                  Sleep Apnea.  Sonographer:     Cristela Blue Referring Phys:  [id number] Marrion Coy Diagnosing Phys: Yvonne Kendall MD IMPRESSIONS  1. Left ventricular ejection fraction, by estimation, is >55%. The left ventricle has normal function. The left ventricle has no regional wall motion abnormalities.  2. Right ventricular systolic function is normal. The right ventricular size is normal.  3. Moderate pericardial effusion. The pericardial effusion is circumferential. There is no evidence of cardiac tamponade.  4. The mitral valve is normal in structure. Comparison(s): A prior study was performed on 03/01/2023. Pericardial effusion appears slightly smaller. FINDINGS  Left Ventricle: Left ventricular ejection fraction, by estimation, is >55%. The left ventricle has normal function. The left ventricle has no regional wall motion abnormalities. The left ventricular internal cavity size was normal in size. There is borderline left ventricular hypertrophy. Right Ventricle: The right ventricular size is normal. No increase in right ventricular wall thickness. Right ventricular systolic  function is normal. Pericardium: A moderately sized pericardial effusion is present. The pericardial effusion is circumferential. There is no evidence of cardiac tamponade. Mitral Valve: The mitral valve is normal in structure. Tricuspid Valve: The tricuspid  valve is grossly normal. Tricuspid valve regurgitation is mild. Aorta: The aortic root is normal in size and structure. Venous: The inferior vena cava was not well visualized. Additional Comments: Spectral Doppler performed. Color Doppler performed.  LEFT VENTRICLE PLAX 2D LVIDd:         4.30 cm LVIDs:         2.30 cm LV PW:         1.12 cm LV IVS:        0.95 cm   AORTA Ao Root diam: 3.30 cm Yvonne Kendall MD Electronically signed by Yvonne Kendall MD Signature Date/Time: 03/04/2023/11:43:49 AM    Final    IR US LIVER BIOPSY  Result Date: 03/03/2023 INDICATION: 62 year old male with pancreatic mass and concern for hepatic metastatic disease. EXAM: LIVER CORE BIOPSY MEDICATIONS: None. ANESTHESIA/SEDATION: Moderate (conscious) sedation was employed during this procedure. A total of Versed 1 mg and Fentanyl 50 mcg was administered intravenously. Moderate Sedation Time: 10 minutes. The patient's level of consciousness and vital signs were monitored continuously by radiology nursing throughout the procedure under my direct supervision. FLUOROSCOPY TIME:  None. COMPLICATIONS: None immediate. PROCEDURE: Informed written consent was obtained from the patient after a thorough discussion of the procedural risks, benefits and alternatives. All questions were addressed. Maximal Sterile Barrier Technique was utilized including caps, mask, sterile gowns, sterile gloves, sterile drape, hand hygiene and skin antiseptic. A timeout was performed prior to the initiation of the procedure. The right upper quadrant was interrogated with ultrasound. Multiple echogenic masses present throughout the liver. A suitable skin entry site was selected and marked. The region was sterilely prepped and draped in the standard fashion using chlorhexidine skin prep. Local anesthesia was attained by infiltration with 1% lidocaine. A small dermatotomy was made. Under real-time sonographic guidance, a 17 gauge introducer needle was advanced into  the margin of the mass. Multiple 18 gauge biopsies were then coaxially obtained using the BioPince automated biopsy device. Biopsy samples were placed in formalin and delivered to pathology for further analysis. As a 17 gauge introducer needle was removed, the biopsy tract was embolized with a Gel-Foam slurry. Post biopsy ultrasound imaging demonstrates no evidence of active hemorrhage or perihepatic hematoma. The patient tolerated the procedure well. IMPRESSION: Technically successful ultrasound-guided core biopsy of hepatic lesion. Electronically Signed   By: Malachy Moan M.D.   On: 03/03/2023 14:59   ECHOCARDIOGRAM COMPLETE  Result Date: 03/01/2023    ECHOCARDIOGRAM REPORT   Patient Name:   TEEGAN DESOCIO Date of Exam: 03/01/2023 Medical Rec #:  [id number]     Height:       69.0 in Accession #:    [id number]    Weight:       149.9 lb Date of Birth:  07/08/1960    BSA:          1.828 m Patient Age:    61 years      BP:           138/75 mmHg Patient Gender: M             HR:           116 bpm. Exam Location:  [facility] Procedure: 2D Echo Indications:     Pulmonary Embolus I26.09  History:  Patient has no prior history of Echocardiogram examinations.  Sonographer:     Overton Mam RDCS, FASE Referring Phys:  [id number] Vernetta Honey MANSY Diagnosing Phys: Julien Nordmann MD IMPRESSIONS  1. Left ventricular ejection fraction, by estimation, is 60 to 65%. The left ventricle has normal function. The left ventricle has no regional wall motion abnormalities. Left ventricular diastolic parameters are indeterminate.  2. Right ventricular systolic function is normal. The right ventricular size is normal. There is moderately elevated pulmonary artery systolic pressure. The estimated right ventricular systolic pressure is 54.0 mmHg.  3. Large pericardial effusion. The pericardial effusion is circumferential. There is no evidence of cardiac tamponade. 1.95 off the RV free wall, 2.16 cm off the apical region, 1.70 cm off the  LV free wall. IVC is not dilated at 1.16 cm  4. The mitral valve is normal in structure. No evidence of mitral valve regurgitation. No evidence of mitral stenosis.  5. The aortic valve is tricuspid. Aortic valve regurgitation is not visualized. No aortic stenosis is present.  6. The inferior vena cava is normal in size with greater than 50% respiratory variability, suggesting right atrial pressure of 3 mmHg. FINDINGS  Left Ventricle: Left ventricular ejection fraction, by estimation, is 60 to 65%. The left ventricle has normal function. The left ventricle has no regional wall motion abnormalities. The left ventricular internal cavity size was normal in size. There is  no left ventricular hypertrophy. Left ventricular diastolic parameters are indeterminate. Right Ventricle: The right ventricular size is normal. No increase in right ventricular wall thickness. Right ventricular systolic function is normal. There is moderately elevated pulmonary artery systolic pressure. The tricuspid regurgitant velocity is 3.50 m/s, and with an assumed right atrial pressure of 5 mmHg, the estimated right ventricular systolic pressure is 54.0 mmHg. Left Atrium: Left atrial size was normal in size. Right Atrium: Right atrial size was normal in size. Pericardium: A large pericardial effusion is present. The pericardial effusion is circumferential. There is no evidence of cardiac tamponade. Mitral Valve: The mitral valve is normal in structure. No evidence of mitral valve regurgitation. No evidence of mitral valve stenosis. Tricuspid Valve: The tricuspid valve is normal in structure. Tricuspid valve regurgitation is mild . No evidence of tricuspid stenosis. Aortic Valve: The aortic valve is tricuspid. Aortic valve regurgitation is not visualized. No aortic stenosis is present. Aortic valve peak gradient measures 12.0 mmHg. Pulmonic Valve: The pulmonic valve was normal in structure. Pulmonic valve regurgitation is not visualized. No  evidence of pulmonic stenosis. Aorta: The aortic root is normal in size and structure. Venous: The inferior vena cava is normal in size with greater than 50% respiratory variability, suggesting right atrial pressure of 3 mmHg. IAS/Shunts: No atrial level shunt detected by color flow Doppler.  LEFT VENTRICLE PLAX 2D LVIDd:         4.60 cm   Diastology LVIDs:         2.90 cm   LV e' medial:    11.70 cm/s LV PW:         1.00 cm   LV E/e' medial:  8.8 LV IVS:        1.00 cm   LV e' lateral:   18.60 cm/s LVOT diam:     2.10 cm   LV E/e' lateral: 5.5 LV SV:         67 LV SV Index:   37 LVOT Area:     3.46 cm  RIGHT VENTRICLE RV Basal diam:  2.50 cm  RV S prime:     22.30 cm/s TAPSE (M-mode): 1.8 cm LEFT ATRIUM             Index        RIGHT ATRIUM          Index LA diam:        2.70 cm 1.48 cm/m   RA Area:     7.05 cm LA Vol (A2C):   45.8 ml 25.06 ml/m  RA Volume:   9.68 ml  5.30 ml/m LA Vol (A4C):   25.5 ml 13.95 ml/m LA Biplane Vol: 35.4 ml 19.37 ml/m  AORTIC VALVE                 PULMONIC VALVE AV Area (Vmax): 2.46 cm     PV Vmax:        1.10 m/s AV Vmax:        173.00 cm/s  PV Peak grad:   4.8 mmHg AV Peak Grad:   12.0 mmHg    RVOT Peak grad: 4 mmHg LVOT Vmax:      123.00 cm/s LVOT Vmean:     79.500 cm/s LVOT VTI:       0.193 m  AORTA                        PULMONARY ARTERY Ao Root diam: 3.50 cm        MPA diam:        3.00 cm Ao Asc diam:  2.70 cm MITRAL VALVE                TRICUSPID VALVE MV Area (PHT): 7.22 cm     TR Peak grad:   49.0 mmHg MV Decel Time: 105 msec     TR Vmax:        350.00 cm/s MV E velocity: 103.00 cm/s MV A velocity: 87.50 cm/s   SHUNTS MV E/A ratio:  1.18         Systemic VTI:  0.19 m                             Systemic Diam: 2.10 cm Julien Nordmann MD Electronically signed by Julien Nordmann MD Signature Date/Time: 03/01/2023/3:02:54 PM    Final      Assessment and Plan:   Pericardial effusion: Incidentally noted on CTA chest, found to be large on initially echo, slightly smaller  on repeat study today.  No definitive findings of tamponade seen on either echo.  Tachycardia could be indication of increased intrapericardial pressure, though acute PE and other illness could also be contributing to sinus tachycardia.  The patient is not hypotensive.  Given presumed metastatic pancreatic cancer, I am concerned that pericardial effusion is malignant.  Only way to prove would be with pericardiocentesis.  Given that the patient is on therapeutic anticoagulation for his acute DVT/PE, I would be reluctant to proceed with pericardiocentesis unless he develops objective findings of cardiac tamponade by echo or physical exam.  I recommend gentle hydration and reevaluation of his HR and symptoms tomorrow AM.  If he declines, repeat echo will need to be performed tomorrow and pericardiocentesis reconsidered.  If his HR comes down with hydration and he otherwise continues to feel well, I suggest outpatient echo early next week to reassess his effusion.  Presumed metastatic pancreatic cancer: Workup this admission notable for pancreatic mass with multiple masses in the liver suspicious for metastases.  Pericardial effusion very well could also be malignant.  Await pathology results of liver biopsy performed yesterday.  Ongoing management per oncology and IM.  Acute DVT and PE: Likely provoked by active malignancy.  Patient currently on enoxaparin, which is reasonable.  Ongoing management per IM and heme-onc.  For questions or updates, please contact [facility] Please consult [email or web address] for contact info under [facility].  Signed, Yvonne Kendall, MD  03/04/2023 3:25 PM

## 2023-03-05 ENCOUNTER — Other Ambulatory Visit (HOSPITAL_COMMUNITY): Payer: Self-pay

## 2023-03-05 ENCOUNTER — Telehealth (HOSPITAL_COMMUNITY): Payer: Self-pay | Admitting: Pharmacy Technician

## 2023-03-05 ENCOUNTER — Encounter: Payer: Self-pay | Admitting: Internal Medicine

## 2023-03-05 ENCOUNTER — Inpatient Hospital Stay: Payer: BC Managed Care – PPO

## 2023-03-05 ENCOUNTER — Ambulatory Visit: Admission: RE | Admit: 2023-03-05 | Payer: BC Managed Care – PPO | Source: Ambulatory Visit

## 2023-03-05 ENCOUNTER — Telehealth: Payer: Self-pay

## 2023-03-05 ENCOUNTER — Telehealth: Payer: Self-pay | Admitting: *Deleted

## 2023-03-05 ENCOUNTER — Other Ambulatory Visit: Payer: Self-pay | Admitting: Internal Medicine

## 2023-03-05 DIAGNOSIS — Z794 Long term (current) use of insulin: Secondary | ICD-10-CM

## 2023-03-05 DIAGNOSIS — D649 Anemia, unspecified: Secondary | ICD-10-CM

## 2023-03-05 DIAGNOSIS — I3139 Other pericardial effusion (noninflammatory): Secondary | ICD-10-CM

## 2023-03-05 DIAGNOSIS — J189 Pneumonia, unspecified organism: Secondary | ICD-10-CM | POA: Diagnosis not present

## 2023-03-05 DIAGNOSIS — I3131 Malignant pericardial effusion in diseases classified elsewhere: Secondary | ICD-10-CM

## 2023-03-05 DIAGNOSIS — Z7189 Other specified counseling: Secondary | ICD-10-CM | POA: Diagnosis not present

## 2023-03-05 DIAGNOSIS — I2699 Other pulmonary embolism without acute cor pulmonale: Secondary | ICD-10-CM | POA: Diagnosis not present

## 2023-03-05 DIAGNOSIS — K8689 Other specified diseases of pancreas: Secondary | ICD-10-CM | POA: Diagnosis not present

## 2023-03-05 DIAGNOSIS — E43 Unspecified severe protein-calorie malnutrition: Secondary | ICD-10-CM

## 2023-03-05 DIAGNOSIS — E1165 Type 2 diabetes mellitus with hyperglycemia: Secondary | ICD-10-CM

## 2023-03-05 DIAGNOSIS — C259 Malignant neoplasm of pancreas, unspecified: Secondary | ICD-10-CM | POA: Insufficient documentation

## 2023-03-05 DIAGNOSIS — R000 Tachycardia, unspecified: Secondary | ICD-10-CM

## 2023-03-05 LAB — BASIC METABOLIC PANEL
Anion gap: 4 — ABNORMAL LOW (ref 5–15)
BUN: 17 mg/dL (ref 8–23)
CO2: 23 mmol/L (ref 22–32)
Calcium: 8.4 mg/dL — ABNORMAL LOW (ref 8.9–10.3)
Chloride: 108 mmol/L (ref 98–111)
Creatinine, Ser: 0.63 mg/dL (ref 0.61–1.24)
GFR, Estimated: >60 mL/min (ref >60)
Glucose, Bld: 173 mg/dL — ABNORMAL HIGH (ref 70–99)
Potassium: 4.1 mmol/L (ref 3.5–5.1)
Sodium: 135 mmol/L (ref 135–145)

## 2023-03-05 LAB — CBC
HCT: 25.1 % — ABNORMAL LOW (ref 39.0–52.0)
Hemoglobin: 8.1 g/dL — ABNORMAL LOW (ref 13.0–17.0)
MCH: 28.2 pg (ref 26.0–34.0)
MCHC: 32.3 g/dL (ref 30.0–36.0)
MCV: 87.5 fL (ref 80.0–100.0)
Platelets: 371 10*3/uL (ref 150–400)
RBC: 2.87 MIL/uL — ABNORMAL LOW (ref 4.22–5.81)
RDW: 14.1 % (ref 11.5–15.5)
WBC: 17.8 10*3/uL — ABNORMAL HIGH (ref 4.0–10.5)
nRBC: 0 % (ref 0.0–0.2)

## 2023-03-05 LAB — GLUCOSE, CAPILLARY
Glucose-Capillary: 140 mg/dL — ABNORMAL HIGH (ref 70–99)
Glucose-Capillary: 247 mg/dL — ABNORMAL HIGH (ref 70–99)
Glucose-Capillary: 179 mg/dL — ABNORMAL HIGH (ref 70–99)
Glucose-Capillary: 145 mg/dL — ABNORMAL HIGH (ref 70–99)

## 2023-03-05 LAB — CULTURE, BLOOD (ROUTINE X 2)
Culture: NO GROWTH
Culture: NO GROWTH
Special Requests: ADEQUATE
Special Requests: ADEQUATE

## 2023-03-05 LAB — MAGNESIUM: Magnesium: 2.2 mg/dL (ref 1.7–2.4)

## 2023-03-05 LAB — LACTIC ACID, PLASMA: Lactic Acid, Venous: 1.4 mmol/L (ref 0.5–1.9)

## 2023-03-05 LAB — PHOSPHORUS: Phosphorus: 2.6 mg/dL (ref 2.5–4.6)

## 2023-03-05 MED ORDER — METOPROLOL TARTRATE 50 MG PO TABS
50.0000 mg | ORAL_TABLET | Freq: Two times a day (BID) | ORAL | Status: DC
  Administered 2023-03-05 – 2023-03-07 (×4): 50 mg via ORAL
  Filled 2023-03-05 (×4): qty 1

## 2023-03-05 MED ORDER — APIXABAN 5 MG PO TABS
10.0000 mg | ORAL_TABLET | Freq: Two times a day (BID) | ORAL | Status: DC
  Administered 2023-03-05 – 2023-03-07 (×4): 10 mg via ORAL
  Filled 2023-03-05 (×4): qty 2

## 2023-03-05 MED ORDER — APIXABAN 5 MG PO TABS
5.0000 mg | ORAL_TABLET | Freq: Two times a day (BID) | ORAL | Status: DC
Start: 2023-03-12 — End: 2023-03-07

## 2023-03-05 MED ORDER — INSULIN ASPART 100 UNIT/ML IJ SOLN
5.0000 [IU] | Freq: Three times a day (TID) | INTRAMUSCULAR | Status: DC
  Administered 2023-03-05 – 2023-03-07 (×6): 5 [IU] via SUBCUTANEOUS
  Filled 2023-03-05 (×6): qty 1

## 2023-03-05 NOTE — Inpatient Diabetes Management (Addendum)
Inpatient Diabetes Program Recommendations  AACE/ADA: New Consensus Statement on Inpatient Glycemic Control (2015)  Target Ranges:  Prepandial:   less than 140 mg/dL      Peak postprandial:   less than 180 mg/dL (1-2 hours)      Critically ill patients:  140 - 180 mg/dL   Lab Results  Component Value Date   GLUCAP 140 (H) 03/05/2023   HGBA1C 10.3 (H) 02/28/2023    Latest Reference Range & Units 03/04/23 08:12 03/04/23 12:27 03/04/23 15:52 03/04/23 21:16 03/05/23 07:26  Glucose-Capillary 70 - 99 mg/dL 409 (H) 811 (H) 914 (H) 303 (H) 140 (H)  (H): Data is abnormally high  Review of Glycemic Control  Diabetes history: DM2 Outpatient Diabetes medications: Jardiance 25 mg Daily, Metformin 1000 mg bid, Toujeo 25 units Daily  Current orders for Inpatient glycemic control: Novolog 0-9 units tid + hs, Semglee 25 units Daily, Jardiance 25 mg Daily   Inpatient Diabetes Program Recommendations:   If postprandial CBGs remain elevated >180, Please consider: -Increase Novolog meal coverage to 5 units tid if eats 50% meals  Thank you, Billy Fischer. Anetra Czerwinski, RN, MSN, CDCES  Diabetes Coordinator Inpatient Glycemic Control Team Team Pager [phone] (8am-5pm) 03/05/2023 8:20 AM

## 2023-03-05 NOTE — Progress Notes (Signed)
START ON PATHWAY REGIMEN - Pancreatic Adenocarcinoma     A cycle is every 28 days:     Nab-paclitaxel (protein bound)      Gemcitabine   **Always confirm dose/schedule in your pharmacy ordering system**  Patient Characteristics: Metastatic Disease, First Line, PS = 0,1, BRCA1/2 and PALB2  Mutation Absent/Unknown Therapeutic Status: Metastatic Disease Line of Therapy: First Line ECOG Performance Status: 1 BRCA1/2 Mutation Status: Awaiting Test Results PALB2 Mutation Status: Awaiting Test Results Intent of Therapy: Non-Curative / Palliative Intent, Discussed with Patient 

## 2023-03-05 NOTE — Assessment & Plan Note (Signed)
Sinus tachycardia.  Increased metoprolol to 50 mg twice a day.

## 2023-03-05 NOTE — Progress Notes (Signed)
Please schedule:  # Chem ed ASAP- gem-Abraxane  # 11/26- MD; labs- cbc/cmp; gem-Abraxane [new] # 12/10-MD; labs- cbc/cmp; gem-Abraxane - dr.B

## 2023-03-05 NOTE — Telephone Encounter (Addendum)
Patient Product/process development scientist completed.    The patient is insured through [facility]. Patient has [employer], may use a copay card, and/or apply for patient assistance if available.    Ran test claim for enoxaparin (Lovenox) 80 mg/0.8 ml inj and the current 30 day co-pay is $171.95.  Ran test claim for Xarelto 20 mg and the current 30 day co-pay is $0.00.  This test claim was processed through [facility]- copay amounts may vary at other pharmacies due to pharmacy/plan contracts, or as the patient moves through the different stages of their insurance plan.     Roland Earl, CPHT Pharmacy Technician III Certified Patient Advocate [facility] Patient Advocate Team Direct Number: [phone]  Fax: [phone]

## 2023-03-05 NOTE — Telephone Encounter (Signed)
Form faxed after doctor signature and copied for chart. Tony Benson informed

## 2023-03-05 NOTE — Assessment & Plan Note (Signed)
Continue supplements

## 2023-03-05 NOTE — Telephone Encounter (Signed)
[facility] requested on specimen [id number], liver, collected 03/03/2023.

## 2023-03-05 NOTE — Progress Notes (Signed)
Progress Note   Patient: Tony Benson:[id number] DOB: 1960-04-22 DOA: 02/28/2023     5 DOS: the patient was seen and examined on 03/05/2023   Brief hospital course: 62 year old with history of asthma, OSA on CPAP, DM2, prostate cancer comes to the ED for worsening dyspnea. Patient was recently diagnosed with right lower extremity DVT and placed on Eliquis. CTA chest shows small nonocclusive left upper lobe PE with moderate to large circumferential pericardial effusion and right-sided pleural effusion with groundglass opacity. Also found to have a right hepatic mass of 12.6 cm. MRI liver eventually showed concern for pancreatic head mass with multiple liver lesions.  Patient had ultrasound-guided liver biopsy on 11/18.  11/20.  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  Hemoglobin dipped down to 8.1.  Check hemoglobin again tomorrow.  Assessment and Plan: * Acute pulmonary embolism (HCC) Acute pulmonary embolism without cor pulmonale, bilateral lower extremity DVT.  Patient was on heparin drip and switched over to Lovenox injections.  Case discussed with Dr. Donneta Romberg oncology and Lovenox injections are pretty expensive for the patient.  Dr. Donneta Romberg okay switching over to Eliquis 10 mg twice a day for 1 week and then 5 mg twice a day after that.  Mass of head of pancreas With liver metastases.  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  Will need close follow-up with oncology as outpatient.  This is stage IV cancer and all treatments will be palliative in nature.  Pericardial effusion Moderate pericardial effusion no evidence of tamponade on echo  Sepsis due to pneumonia [facility]) Patient completed 5 days of antibiotics  Uncontrolled type 2 diabetes mellitus with hyperglycemia, with long-term current use of  insulin (HCC) Sugar yesterday up to 303.  Hemoglobin A1c elevated at 10.3.  Patient on sliding scale insulin plus I increase the standing insulin 5 units prior to meals.  Patient also on Semglee insulin 30 units daily.  Essential hypertension Continue Cozaar, Norvasc and patient also on metoprolol.  Will increase metoprolol dose with tachycardia.  Normocytic anemia Suspect anemia from chronic disease with cancer diagnosis.  Since hemoglobin did dip down to point since yesterday, I stopped IV fluids and will check hemoglobin again tomorrow.  Tachycardia Sinus tachycardia.  Increase metoprolol to 50 mg twice a day.  Protein-calorie malnutrition, severe Continue supplements  Asthma, chronic, unspecified asthma severity, with acute exacerbation Patient DuoNeb as needed  Dyslipidemia Continue Lipitor        Subjective: Patient still feels weak.  Patient has some chronic right shoulder pain.  No abdominal pain.  Initially came in with shortness of breath diagnosed with DVT and PE and also found to have pancreatic mass and liver masses.  Physical Exam: Vitals:   03/05/23 0724 03/05/23 1054 03/05/23 1056 03/05/23 1631  BP: 122/66   (!) 145/73  Pulse: (!) 114 (!) 104 (!) 115 (!) 125  Resp: 16     Temp: 98.8 F (37.1 C)     TempSrc: Oral     SpO2: 94%   93%  Weight:      Height:       Physical Exam HENT:     Head: Normocephalic.     Mouth/Throat:     Pharynx: No oropharyngeal exudate.  Eyes:     General: Lids are normal.     Conjunctiva/sclera: Conjunctivae normal.  Cardiovascular:     Rate and Rhythm: Regular rhythm.  Tachycardia present.     Heart sounds: Normal heart sounds, S1 normal and S2 normal.  Pulmonary:     Breath sounds: Examination of the right-lower field reveals decreased breath sounds. Examination of the left-lower field reveals decreased breath sounds. Decreased breath sounds present. No wheezing, rhonchi or rales.  Abdominal:     Palpations: Abdomen is  soft.     Tenderness: There is no abdominal tenderness.  Musculoskeletal:     Right lower leg: Swelling present.     Left lower leg: No swelling.  Skin:    General: Skin is warm.     Findings: No rash.  Neurological:     Mental Status: He is alert and oriented to person, place, and time.     Data Reviewed: White blood cell count 17.8, hemoglobin 8.1, platelet count 371, creatinine 0.63, sodium 135  Family Communication: Spoke with family at the bedside and on the phone  Disposition: Status is: Inpatient Remains inpatient appropriate because: Hemoglobin dipped down to 8.1 today.  Discontinued IV fluids.  Rechecking hemoglobin tomorrow morning.  Planned Discharge Destination: Home    Time spent: 32 minutes Case discussed with oncology and palliative care  Author: Alford Highland, MD 03/05/2023 4:38 PM  For on call review [email or web address].

## 2023-03-05 NOTE — Assessment & Plan Note (Addendum)
Hemoglobin A1c elevated at 10.3.  Patient on sliding scale insulin +5 units prior to meals, patient also on Semglee insulin 30 units daily.

## 2023-03-05 NOTE — Progress Notes (Signed)
       CROSS COVER NOTE  NAME: Tony Benson MRN: [id number] DOB : 02-Mar-1961    Concern as stated by nurse / staff     Pt's latest temp is a 102.7, HR: 118 bpm. Pt is on Red MEWS because of the latest VS. Pt has a history of asthma, OSA on CPAP, DM2, prostate cancer. Pt's been complaining of right shoulder pain all day. Will give him his PRN Tylenol and PRN Dilaudid. I will let you know the repeat VS after an hour of giving the PRN Tylenol and Dilaudid. Thank you!     Pertinent findings on chart review: Tony Benson hs history of asthma, OSA on CPAP, DM2, prostate cancer He came to the  ED for worsening dyspnea. Patient was recently diagnosed with right lower extremity DVT and placed on Eliquis. CTA chest shows small nonocclusive left upper lobe PE with moderate to large circumferential pericardial effusion and right-sided pleural effusion with groundglass opacity. Also found to have a right hepatic mass of 12.6 cm. MRI liver eventually showed concern for pancreatic head mass with multiple liver lesions. Patient had ultrasound-guided liver biopsy on 11/18. This was found to be consistent with metastatic carcinoma.  Patietn has also completed 5 days of azithromycin for the pleural effusions present on admission  Assessment and  Interventions   Assessment:    03/06/2023    3:30 AM 03/06/2023    1:00 AM 03/05/2023   11:56 PM  Vitals with BMI  Systolic 127  100  Diastolic 76  64  Pulse 103 85 87   Temp at 2029 PM102.7 Oxygen saturations decrease 90% on room air with  increased resp rate    Latest Ref Rng & Units 03/05/2023    4:56 AM 03/04/2023    4:20 AM 03/03/2023    5:55 AM  CBC  WBC 4.0 - 10.5 K/uL 17.8  17.3  18.6   Hemoglobin 13.0 - 17.0 g/dL 8.1  40.9  9.2   Hematocrit 39.0 - 52.0 % 25.1  32.6  29.0   Platelets 150 - 400 K/uL 371  383  353     Follow up chest xray obtained. Reviewed by me - shows worsening right lower lobe pulmonary infiltrate Plan: Repeat blood cultures  (last done 11/15 Start unasyn - possible aspiration as cause based on location Monitor fever curve and white count       Donnie Mesa NP [facility] Cross Cover 7pm-7am - check amion for availability Pager [phone]

## 2023-03-05 NOTE — Progress Notes (Signed)
JEDREK FIRPO   DOB:17-May-1960   MW#:[id number]    Subjective: Patient s/p biopsy of the liver on 11/18-results pending.  Patient states that he is "fine".  Appetite improving as per patient.  His wife by the bedside.   Objective:  Vitals:   03/05/23 1631 03/05/23 2029  BP: (!) 145/73 119/73  Pulse: (!) 125 (!) 118  Resp:  18  Temp:  (!) 102.7 F (39.3 C)  SpO2: 93% 90%     Intake/Output Summary (Last 24 hours) at 03/05/2023 2114 Last data filed at 03/05/2023 0600 Gross per 24 hour  Intake 699.71 ml  Output 800 ml  Net -100.29 ml    Physical Exam Vitals and nursing note reviewed.  Constitutional:      Comments:     HENT:     Head: Normocephalic and atraumatic.     Mouth/Throat:     Mouth: Mucous membranes are moist.     Pharynx: No oropharyngeal exudate.  Eyes:     Pupils: Pupils are equal, round, and reactive to light.  Cardiovascular:     Rate and Rhythm: Regular rhythm. Tachycardia present.  Pulmonary:     Effort: No respiratory distress.     Breath sounds: No wheezing.     Comments: Decreased breath sounds bilaterally at bases.  No wheeze or crackles Abdominal:     General: Bowel sounds are normal. There is no distension.     Palpations: Abdomen is soft. There is no mass.     Tenderness: There is no abdominal tenderness. There is no guarding or rebound.  Musculoskeletal:        General: No tenderness. Normal range of motion.     Cervical back: Normal range of motion and neck supple.  Skin:    General: Skin is warm.  Neurological:     Mental Status: He is alert and oriented to person, place, and time.  Psychiatric:        Mood and Affect: Affect normal.        Judgment: Judgment normal.      Labs:  Lab Results  Component Value Date   WBC 17.8 (H) 03/05/2023   HGB 8.1 (L) 03/05/2023   HCT 25.1 (L) 03/05/2023   MCV 87.5 03/05/2023   PLT 371 03/05/2023   NEUTROABS 16.5 (H) 02/24/2023    Lab Results  Component Value Date   NA 135 03/05/2023   K  4.1 03/05/2023   CL 108 03/05/2023   CO2 23 03/05/2023    Studies:  ECHOCARDIOGRAM LIMITED  Result Date: 03/04/2023    ECHOCARDIOGRAM LIMITED REPORT   Patient Name:   PAULA ROSTEN Date of Exam: 03/04/2023 Medical Rec #:  [id number]     Height:       69.0 in Accession #:    [id number]    Weight:       148.8 lb Date of Birth:  1960/07/19    BSA:          1.822 m Patient Age:    61 years      BP:           117/60 mmHg Patient Gender: M             HR:           112 bpm. Exam Location:  [facility] Procedure: Limited Echo, Color Doppler and Cardiac Doppler Indications:     Pericardial Effusion I31.3  History:         Patient has prior history  of Echocardiogram examinations, most                  recent 03/01/2023. Risk Factors:Diabetes, Hypertension and                  Sleep Apnea.  Sonographer:     Cristela Blue Referring Phys:  [id number] Marrion Coy Diagnosing Phys: Yvonne Kendall MD IMPRESSIONS  1. Left ventricular ejection fraction, by estimation, is >55%. The left ventricle has normal function. The left ventricle has no regional wall motion abnormalities.  2. Right ventricular systolic function is normal. The right ventricular size is normal.  3. Moderate pericardial effusion. The pericardial effusion is circumferential. There is no evidence of cardiac tamponade.  4. The mitral valve is normal in structure. Comparison(s): A prior study was performed on 03/01/2023. Pericardial effusion appears slightly smaller. FINDINGS  Left Ventricle: Left ventricular ejection fraction, by estimation, is >55%. The left ventricle has normal function. The left ventricle has no regional wall motion abnormalities. The left ventricular internal cavity size was normal in size. There is borderline left ventricular hypertrophy. Right Ventricle: The right ventricular size is normal. No increase in right ventricular wall thickness. Right ventricular systolic function is normal. Pericardium: A moderately sized pericardial effusion is  present. The pericardial effusion is circumferential. There is no evidence of cardiac tamponade. Mitral Valve: The mitral valve is normal in structure. Tricuspid Valve: The tricuspid valve is grossly normal. Tricuspid valve regurgitation is mild. Aorta: The aortic root is normal in size and structure. Venous: The inferior vena cava was not well visualized. Additional Comments: Spectral Doppler performed. Color Doppler performed.  LEFT VENTRICLE PLAX 2D LVIDd:         4.30 cm LVIDs:         2.30 cm LV PW:         1.12 cm LV IVS:        0.95 cm   AORTA Ao Root diam: 3.30 cm Yvonne Kendall MD Electronically signed by Yvonne Kendall MD Signature Date/Time: 03/04/2023/11:43:49 AM    Final     62 y.o. male patient with a history of diabetes currently admitted to hospital for worsening shortness of breath/acute PE.  Abdomen pelvis CT scan/liver MRI -noted to have  pancreatic head mass/liver mass   # Pancreatic head mass-with multiple-liver masses- highly suspicious for malignancy-status post biopsy on 11/18.  Pathology pending.  CA 19-9-normal; CEA elevated-see discussion below.  Patient keen to start chemotherapy.  Will tentatively plan chemotherapy on 11/26- Gem-Abraxane. Plan chemo ed.   # Acute PE/bilateral DVT-on IV heparin-recommend discharge on Lovenox 1 mg SQ twice a day; however given cost issues I think is reasonable to switch over to Eliquis at discharge.   # Sinus tachycardia -multifactorial large pericardial effusion noted-no evidence of tamponade. [On echo/CT scan]-status post cardiology evaluation.  Continue IV fluids holding off any pericardiocentesis.  # Status post palliative care evaluation-     Earna Coder, MD 03/05/2023  9:14 PM

## 2023-03-05 NOTE — Telephone Encounter (Signed)
Received FMLA for both patient spouse Tony Benson and daughter Tony Benson. Forms completed and sent for Benson signature

## 2023-03-05 NOTE — TOC Progression Note (Signed)
Transition of Care [facility]) - Progression Note    Patient Details  Name: Tony Benson MRN: [id number] Date of Birth: 1961-01-10  Transition of [facility]) CM/SW Contact  Truddie Hidden, RN Phone Number: 03/05/2023, 10:38 AM  Clinical Narrative:    TOC continuing to follow patient's progress throughout discharge planning.        Expected Discharge Plan and Services                                               Social Determinants of Health (SDOH) Interventions SDOH Screenings   Food Insecurity: No Food Insecurity (02/28/2023)  Housing: Low Risk  (02/28/2023)  Transportation Needs: No Transportation Needs (02/28/2023)  Utilities: Not At Risk (02/28/2023)  Depression (PHQ2-9): Low Risk  (02/24/2023)  Tobacco Use: Low Risk  (02/28/2023)    Readmission Risk Interventions     No data to display

## 2023-03-05 NOTE — Assessment & Plan Note (Addendum)
Suspect anemia from chronic disease with cancer diagnosis.  Hemoglobin 9.1 upon discharge.

## 2023-03-05 NOTE — Progress Notes (Signed)
Daily Progress Note   Patient Name: Tony Benson       Date: 03/05/2023 DOB: [birth year]  Age: 62 y.o. MRN#: [id number] Attending Physician: Alford Highland, MD Primary Care Physician: Sherrie Mustache, MD Admit Date: 02/28/2023  Reason for Consultation/Follow-up: Establishing goals of care  Subjective: Notes and labs reviewed. In to see patient. His wife is at bedside. He denies complaint to me, and answers questions in single words or short phrases. He advises that he and Dr. B with Oncology have talked and have a plan moving forward,  which he will continue to discuss directly with him as needed. Attending in to speak with patient about discharge planning. PMT will sign off at this time. Would recommend outpatient follow up with Elouise Munroe NP in the [facility] if patient is amenable.   Length of Stay: 5  Current Medications: Scheduled Meds:   amLODipine  10 mg Oral Daily   apixaban  10 mg Oral BID   Followed by   Melene Muller ON 03/12/2023] apixaban  5 mg Oral BID   atorvastatin  10 mg Oral Daily   empagliflozin  25 mg Oral Daily   feeding supplement  237 mL Oral BID BM   guaiFENesin  600 mg Oral BID   insulin aspart  0-5 Units Subcutaneous QHS   insulin aspart  0-9 Units Subcutaneous TID WC   insulin aspart  5 Units Subcutaneous TID WC   insulin glargine-yfgn  30 Units Subcutaneous Daily   ipratropium  0.5 mg Nebulization BID   levalbuterol  1.25 mg Nebulization BID   losartan  100 mg Oral Daily   metoprolol tartrate  25 mg Oral BID   multivitamin with minerals  1 tablet Oral Daily   omega-3 acid ethyl esters  1,000 mg Oral Daily   Vitamin D (Ergocalciferol)  50,000 Units Oral Q7 days    Continuous Infusions:   PRN Meds: acetaminophen **OR** acetaminophen, ALPRAZolam,  chlorpheniramine-HYDROcodone, hydrALAZINE, HYDROmorphone, ipratropium-albuterol, magnesium hydroxide, metoprolol tartrate, ondansetron **OR** ondansetron (ZOFRAN) IV, senna-docusate, traZODone  Physical Exam Pulmonary:     Effort: Pulmonary effort is normal.  Neurological:     Mental Status: He is alert.             Vital Signs: BP 122/66 (BP Location: Left Arm)   Pulse (!) 115   Temp  98.8 F (37.1 C) (Oral)   Resp 16   Ht 5\' 9"  (1.753 m)   Wt 67.5 kg   SpO2 94%   BMI 21.98 kg/m  SpO2: SpO2: 94 % O2 Device: O2 Device: Room Air O2 Flow Rate: O2 Flow Rate (L/min): 2 L/min  Intake/output summary:  Intake/Output Summary (Last 24 hours) at 03/05/2023 1255 Last data filed at 03/05/2023 0600 Gross per 24 hour  Intake 1183.09 ml  Output 1500 ml  Net -316.91 ml   LBM: Last BM Date : 03/03/23 Baseline Weight: Weight: 68 kg Most recent weight: Weight: 67.5 kg   Patient Active Problem List   Diagnosis Date Noted   Protein-calorie malnutrition, severe 03/04/2023   Mass of head of pancreas 03/01/2023   Acute pulmonary embolism (HCC) 02/28/2023   Sepsis due to pneumonia (HCC) 02/28/2023   Essential hypertension 02/28/2023   Dyslipidemia 02/28/2023   Type 2 diabetes mellitus without complications (HCC) 02/28/2023   Asthma, chronic, unspecified asthma severity, with acute exacerbation 02/28/2023   Weight loss, abnormal 02/24/2023   Localized swelling of both lower legs 02/24/2023   Elevated vitamin B12 level 02/24/2023   History of colonic polyps 02/05/2023   Family history of colon cancer in mother 02/05/2023   Preoperative evaluation to rule out surgical contraindication 08/17/2021   Diabetes mellitus without complication (HCC) 05/21/2021   Prostate cancer (HCC) 05/21/2021   Chronic right shoulder pain 02/24/2019   Chronic myofascial pain 02/24/2019   Family history of malignant neoplasm of gastrointestinal tract    Polyp of transverse colon    Rectal polyp     Diverticulosis of large intestine without diverticulitis    Cervical facet joint syndrome (C2,3,4,5) 02/05/2018   Arthropathy of cervical facet joint 02/05/2018   Cervicalgia 02/05/2018   Chronic pain syndrome 02/05/2018   Hypertension    Cellulitis and abscess of right thigh s/p I&D 09/24/2017 09/24/2017   Asthma, chronic 08/04/2013    Palliative Care Assessment & Plan    Recommendations/Plan: PMT will sign off at this time.  Would recommend outpatient follow up with Elouise Munroe NP in the [facility] if patient is amenable.    Code Status:    Code Status Orders  (From admission, onward)           Start     Ordered   02/28/23 0325  Full code  Continuous       Question:  By:  Answer:  Consent: discussion documented in EHR   02/28/23 0326           Code Status History     Date Active Date Inactive Code Status Order ID Comments User Context   09/24/2017 1954 09/26/2017 1436 Full Code [id number]  Luretha Murphy, MD Inpatient      Advance Directive Documentation    Flowsheet Row Most Recent Value  Type of Advance Directive Living will  Pre-existing out of facility DNR order (yellow form or pink MOST form) --  "MOST" Form in Place? --       Prognosis:  Very poor    Care plan was discussed with attending  Thank you for allowing the Palliative Medicine Team to assist in the care of this patient.   Morton Stall, NP  Please contact Palliative Medicine Team phone at [phone] for questions and concerns.

## 2023-03-05 NOTE — Assessment & Plan Note (Addendum)
With liver metastases.  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  Will need close follow-up with oncology as outpatient.  This is stage IV cancer and all treatments will be palliative in nature.  Patient is a full code at this point.  Overall prognosis poor with metastatic cancer.  Patient prescribed 5 days of Dilaudid to get him to his follow-up appointment with oncology.  And also Narcan if needed.

## 2023-03-05 NOTE — Plan of Care (Signed)
  Problem: Fluid Volume: Goal: Ability to maintain a balanced intake and output will improve Outcome: Progressing   Problem: Metabolic: Goal: Ability to maintain appropriate glucose levels will improve Outcome: Progressing   Problem: Pain Management: Goal: General experience of comfort will improve Outcome: Progressing   Problem: Respiratory: Goal: Ability to maintain adequate ventilation will improve Outcome: Progressing Goal: Ability to maintain a clear airway will improve Outcome: Progressing   Problem: Discharge Progression Outcomes Goal: Pain controlled with appropriate interventions Outcome: Progressing

## 2023-03-05 NOTE — Assessment & Plan Note (Signed)
Moderate pericardial effusion no evidence of tamponade on echo

## 2023-03-06 ENCOUNTER — Other Ambulatory Visit: Payer: Self-pay

## 2023-03-06 DIAGNOSIS — I2699 Other pulmonary embolism without acute cor pulmonale: Secondary | ICD-10-CM | POA: Diagnosis not present

## 2023-03-06 DIAGNOSIS — K8689 Other specified diseases of pancreas: Secondary | ICD-10-CM | POA: Diagnosis not present

## 2023-03-06 DIAGNOSIS — R7989 Other specified abnormal findings of blood chemistry: Secondary | ICD-10-CM | POA: Insufficient documentation

## 2023-03-06 DIAGNOSIS — I3139 Other pericardial effusion (noninflammatory): Secondary | ICD-10-CM | POA: Diagnosis not present

## 2023-03-06 DIAGNOSIS — J189 Pneumonia, unspecified organism: Secondary | ICD-10-CM | POA: Diagnosis not present

## 2023-03-06 DIAGNOSIS — A419 Sepsis, unspecified organism: Secondary | ICD-10-CM | POA: Diagnosis not present

## 2023-03-06 LAB — TYPE AND SCREEN
ABO/RH(D): O POS
Antibody Screen: NEGATIVE

## 2023-03-06 LAB — MAGNESIUM: Magnesium: 2.2 mg/dL (ref 1.7–2.4)

## 2023-03-06 LAB — GLUCOSE, CAPILLARY
Glucose-Capillary: 142 mg/dL — ABNORMAL HIGH (ref 70–99)
Glucose-Capillary: 172 mg/dL — ABNORMAL HIGH (ref 70–99)
Glucose-Capillary: 209 mg/dL — ABNORMAL HIGH (ref 70–99)
Glucose-Capillary: 188 mg/dL — ABNORMAL HIGH (ref 70–99)

## 2023-03-06 LAB — CBC
HCT: 30.4 % — ABNORMAL LOW (ref 39.0–52.0)
Hemoglobin: 9.3 g/dL — ABNORMAL LOW (ref 13.0–17.0)
MCH: 27.1 pg (ref 26.0–34.0)
MCHC: 30.6 g/dL (ref 30.0–36.0)
MCV: 88.6 fL (ref 80.0–100.0)
Platelets: 471 10*3/uL — ABNORMAL HIGH (ref 150–400)
RBC: 3.43 MIL/uL — ABNORMAL LOW (ref 4.22–5.81)
RDW: 14.1 % (ref 11.5–15.5)
WBC: 20.3 10*3/uL — ABNORMAL HIGH (ref 4.0–10.5)
nRBC: 0 % (ref 0.0–0.2)

## 2023-03-06 LAB — COMPREHENSIVE METABOLIC PANEL
ALT: 48 U/L — ABNORMAL HIGH (ref 0–44)
AST: 70 U/L — ABNORMAL HIGH (ref 15–41)
Albumin: 2 g/dL — ABNORMAL LOW (ref 3.5–5.0)
Alkaline Phosphatase: 534 U/L — ABNORMAL HIGH (ref 38–126)
Anion gap: 8 (ref 5–15)
BUN: 16 mg/dL (ref 8–23)
CO2: 27 mmol/L (ref 22–32)
Calcium: 9.2 mg/dL (ref 8.9–10.3)
Chloride: 102 mmol/L (ref 98–111)
Creatinine, Ser: 0.67 mg/dL (ref 0.61–1.24)
GFR, Estimated: >60 mL/min (ref >60)
Glucose, Bld: 145 mg/dL — ABNORMAL HIGH (ref 70–99)
Potassium: 4.4 mmol/L (ref 3.5–5.1)
Sodium: 137 mmol/L (ref 135–145)
Total Bilirubin: 0.7 mg/dL (ref <1.2)
Total Protein: 6.4 g/dL — ABNORMAL LOW (ref 6.5–8.1)

## 2023-03-06 LAB — LACTIC ACID, PLASMA: Lactic Acid, Venous: 1.2 mmol/L (ref 0.5–1.9)

## 2023-03-06 LAB — PHOSPHORUS: Phosphorus: 3.5 mg/dL (ref 2.5–4.6)

## 2023-03-06 MED ORDER — SODIUM CHLORIDE 0.9 % IV SOLN
3.0000 g | Freq: Four times a day (QID) | INTRAVENOUS | Status: DC
  Administered 2023-03-06 (×3): 3 g via INTRAVENOUS
  Filled 2023-03-06 (×4): qty 8

## 2023-03-06 MED ORDER — SODIUM CHLORIDE 0.9 % IV SOLN
2.0000 g | Freq: Three times a day (TID) | INTRAVENOUS | Status: DC
  Administered 2023-03-06 – 2023-03-07 (×3): 2 g via INTRAVENOUS
  Filled 2023-03-06 (×4): qty 12.5

## 2023-03-06 NOTE — Progress Notes (Signed)
Tony Benson   DOB:1961/02/19   VW#:[id number]    Subjective: Patient s/p biopsy of the liver on 11/18-results pending.  Patient continues to be tachycardic.  Had at least 2 episodes of fever yesterday.  Started on antibiotics status post blood cultures drawn.  His daughter by the bedside.   Objective:  Vitals:   03/06/23 1703 03/06/23 1915  BP: 125/63 (!) 113/57  Pulse: (!) 119 (!) 113  Resp:  18  Temp: 100.1 F (37.8 C) 98.1 F (36.7 C)  SpO2: 98% 99%    No intake or output data in the 24 hours ending 03/06/23 2104   Physical Exam Vitals and nursing note reviewed.  Constitutional:      Comments:     HENT:     Head: Normocephalic and atraumatic.     Mouth/Throat:     Mouth: Mucous membranes are moist.     Pharynx: No oropharyngeal exudate.  Eyes:     Pupils: Pupils are equal, round, and reactive to light.  Cardiovascular:     Rate and Rhythm: Regular rhythm. Tachycardia present.  Pulmonary:     Effort: No respiratory distress.     Breath sounds: No wheezing.     Comments: Decreased breath sounds bilaterally at bases.  No wheeze or crackles Abdominal:     General: Bowel sounds are normal. There is no distension.     Palpations: Abdomen is soft. There is no mass.     Tenderness: There is no abdominal tenderness. There is no guarding or rebound.  Musculoskeletal:        General: No tenderness. Normal range of motion.     Cervical back: Normal range of motion and neck supple.  Skin:    General: Skin is warm.  Neurological:     Mental Status: He is alert and oriented to person, place, and time.  Psychiatric:        Mood and Affect: Affect normal.        Judgment: Judgment normal.      Labs:  Lab Results  Component Value Date   WBC 20.3 (H) 03/06/2023   HGB 9.3 (L) 03/06/2023   HCT 30.4 (L) 03/06/2023   MCV 88.6 03/06/2023   PLT 471 (H) 03/06/2023   NEUTROABS 16.5 (H) 02/24/2023    Lab Results  Component Value Date   NA 137 03/06/2023   K 4.4 03/06/2023    CL 102 03/06/2023   CO2 27 03/06/2023    Studies:  DG Chest Port 1 View  Result Date: 03/05/2023 CLINICAL DATA:  Shortness of breath.  Follow-up exam. EXAM: PORTABLE CHEST 1 VIEW COMPARISON:  Chest radiograph dated 02/27/2023. FINDINGS: Small right pleural effusion. Right lung base opacity as well as faint clusters of ground-glass density involving the right mid to lower lung field consistent with pneumonia. Overall interval worsening of pulmonary infiltrate since the radiograph of 02/27/2023. The left lung is clear. No pneumothorax. Stable cardiac silhouette. No acute osseous pathology. IMPRESSION: Interval worsening of right lung pneumonia. Electronically Signed   By: Elgie Collard M.D.   On: 03/05/2023 23:52    62 y.o. male patient with a history of diabetes currently admitted to hospital for worsening shortness of breath/acute PE.  Abdomen pelvis CT scan/liver MRI -noted to have  pancreatic head mass/liver mass   # Pancreatic head mass-with multiple-liver masses- highly suspicious for malignancy-status post biopsy on 11/18.  Pathology pending.  CA 19-9-normal; CEA elevated-see discussion below.  Patient keen to start chemotherapy.  Will tentatively  plan chemotherapy on 11/26- Gem-Abraxane. Plan chemo ed.  # Episodes of fever/rising white count-rising LFTs also normal bilirubin-question cholangitis versus underlying infection.  Patient currently on Unasyn.  Discussed with Dr. Fonnie Birkenhead   # Acute PE/bilateral DVT-on IV heparin-recommend discharge on Lovenox 1 mg SQ twice a day; however given cost issues I think is reasonable to switch over to Eliquis at discharge.   # Sinus tachycardia -multifactorial large pericardial effusion noted-no evidence of tamponade. [On echo/CT scan]-status post cardiology evaluation.  Continue IV fluids holding off any pericardiocentesis.  Clinically stable.  # Status post palliative care evaluation-   # I had a long discussion with the patient's daughter  regarding significant complicated/worsening of patient's clinical status-and the fact the patient might not be able to walk out of the hospital of his underlying comorbidities/decompensation to get any further chemotherapy which is planned next week.  She understands patient overall poor prognosis.  However we will continue current scope of care.    Earna Coder, MD 03/06/2023  9:04 PM

## 2023-03-06 NOTE — Plan of Care (Signed)
  Problem: Education: Goal: Knowledge of General Education information will improve Description: Including pain rating scale, medication(s)/side effects and non-pharmacologic comfort measures Outcome: Progressing   Problem: Tissue Perfusion: Goal: Adequacy of tissue perfusion will improve Outcome: Progressing   Problem: Nutritional: Goal: Maintenance of adequate nutrition will improve Outcome: Progressing   Problem: Health Behavior/Discharge Planning: Goal: Ability to manage health-related needs will improve Outcome: Progressing   Problem: Clinical Measurements: Goal: Ability to maintain clinical measurements within normal limits will improve Outcome: Progressing

## 2023-03-06 NOTE — Assessment & Plan Note (Addendum)
Likely secondary to liver mets.  Alkaline phosphatase upon discharge 463, AST 31 and ALT 37 and total bilirubin 0.5.  AST and ALT normalized.  Alkaline phosphatase trending down.

## 2023-03-06 NOTE — Progress Notes (Signed)
Pharmacy Antibiotic Note  Tony Benson is a 62 y.o. male admitted on 02/28/2023 with pneumonia.  Pharmacy has been consulted for Unasyn dosing.  Plan: Unasyn 3 gm IV Q6H ordered to start on 11/21 @ 0100.   Height: 5\' 9"  (175.3 cm) Weight: 67.5 kg (148 lb 13 oz) IBW/kg (Calculated) : 70.7  Temp (24hrs), Avg:99.6 F (37.6 C), Min:98.2 F (36.8 C), Max:102.7 F (39.3 C)  Recent Labs  Lab 02/28/23 0256 02/28/23 0516 02/28/23 0517 03/01/23 0603 03/02/23 0235 03/03/23 0555 03/04/23 0420 03/05/23 0456 03/05/23 2203  WBC  --   --    < > 20.1* 19.1* 18.6* 17.3* 17.8*  --   CREATININE  --   --    < > 0.45* 0.64 0.54* 0.68 0.63  --   LATICACIDVEN 1.1 1.0  --   --   --   --   --   --  1.4   < > = values in this interval not displayed.    Estimated Creatinine Clearance: 92.6 mL/min (by C-G formula based on SCr of 0.63 mg/dL).    Allergies  Allergen Reactions   Other Anaphylaxis    peanuts   Morphine And Codeine Itching   Tree Extract Other (See Comments)    ALMONDS  ---- EYE/ FACIAL SWELLING   Wound Dressing Adhesive Rash    Antimicrobials this admission:   >>    >>   Dose adjustments this admission:   Microbiology results:  BCx:   UCx:    Sputum:    MRSA PCR:   Thank you for allowing pharmacy to be a part of this patient's care.  Bary Limbach D 03/06/2023 12:58 AM

## 2023-03-06 NOTE — Progress Notes (Signed)
   03/05/23 2029  Assess: MEWS Score  Temp (!) 102.7 F (39.3 C)  BP 119/73  MAP (mmHg) 88  Pulse Rate (!) 118  Resp 18  SpO2 90 %  O2 Device Room Air  Assess: MEWS Score  MEWS Temp 2  MEWS Systolic 0  MEWS Pulse 2  MEWS RR 0  MEWS LOC 0  MEWS Score 4  MEWS Score Color Red  Assess: if the MEWS score is Yellow or Red  Were vital signs accurate and taken at a resting state? Yes  Does the patient meet 2 or more of the SIRS criteria? Yes  Does the patient have a confirmed or suspected source of infection? Yes  MEWS guidelines implemented  Yes, red  Treat  MEWS Interventions Considered administering scheduled or prn medications/treatments as ordered  Take Vital Signs  Increase Vital Sign Frequency  Red: Q1hr x2, continue Q4hrs until patient remains green for 12hrs  Escalate  MEWS: Escalate Red: Discuss with charge nurse and notify provider. Consider notifying RRT. If remains red for 2 hours consider need for higher level of care  Notify: Charge Nurse/RN  Name of Charge Nurse/RN Notified Vanita Ingles  Provider Notification  Provider Name/Title Manuela Schwartz  Date Provider Notified 03/05/23  Time Provider Notified 2040  Method of Notification  (secure chat)  Notification Reason Other (Comment) (red mews)  Provider response See new orders  Date of Provider Response 03/05/23  Time of Provider Response 2100  Assess: SIRS CRITERIA  SIRS Temperature  1  SIRS Pulse 1  SIRS Respirations  0  SIRS WBC 0  SIRS Score Sum  2

## 2023-03-06 NOTE — Progress Notes (Signed)
Progress Note   Patient: Tony Benson LKG:[id number] DOB: Oct 04, 1960 DOA: 02/28/2023     6 DOS: the patient was seen and examined on 03/06/2023   Brief hospital course: 62 year old with history of asthma, OSA on CPAP, DM2, prostate cancer comes to the ED for worsening dyspnea. Patient was recently diagnosed with right lower extremity DVT and placed on Eliquis. CTA chest shows small nonocclusive left upper lobe PE with moderate to large circumferential pericardial effusion and right-sided pleural effusion with groundglass opacity. Also found to have a right hepatic mass of 12.6 cm. MRI liver eventually showed concern for pancreatic head mass with multiple liver lesions.  Patient had ultrasound-guided liver biopsy on 11/18.  11/20.  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  Hemoglobin dipped down to 8.1.  Check hemoglobin again tomorrow. 11/21.  Patient had a fever overnight of above 102.  Unasyn started.  Assessment and Plan: * Sepsis due to pneumonia [facility]) This was present on admission.  Patient completed 5 days of antibiotics of Rocephin and Zithromax.  Patient had another fever overnight on 11/20.  Chest x-ray showing worsening pneumonia.  Patient started on Unasyn.  Mass of head of pancreas With liver metastases.  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  Will need close follow-up with oncology as outpatient.  This is stage IV cancer and all treatments will be palliative in nature.  Acute pulmonary embolism (HCC) Acute pulmonary embolism without cor pulmonale, bilateral lower extremity DVT.  Patient was on heparin drip and switched over to Lovenox injections.  Case discussed with Dr. Donneta Romberg oncology and Lovenox injections are pretty expensive for the patient.  Eliquis 10 mg twice a day for 1 week and then  5 mg twice a day after that.  Pericardial effusion Moderate pericardial effusion no evidence of tamponade on echo  Uncontrolled type 2 diabetes mellitus with hyperglycemia, with long-term current use of insulin (HCC) Hemoglobin A1c elevated at 10.3.  Patient on sliding scale insulin plus I increase the standing insulin 5 units prior to meals.  Patient also on Semglee insulin 30 units daily.  Essential hypertension Continue Cozaar, Norvasc and patient also on metoprolol.  Increased metoprolol dose with tachycardia.  Elevated LFTs Likely secondary to liver mets.  Continue to monitor.  Normocytic anemia Suspect anemia from chronic disease with cancer diagnosis.  Hemoglobin up to 9.3 after stopping fluids yesterday.  Tachycardia Sinus tachycardia.  Increased metoprolol to 50 mg twice a day.  Protein-calorie malnutrition, severe Continue supplements  Asthma, chronic, unspecified asthma severity, with acute exacerbation Patient DuoNeb as needed  Dyslipidemia Continue Lipitor        Subjective: Patient feels weak.  Eating better.  Had a fever above 102 last night and started on Unasyn.  Chest x-ray showing worsening pneumonia.  Patient does not feel too short of breath.  Physical Exam: Vitals:   03/06/23 0100 03/06/23 0330 03/06/23 0758 03/06/23 1150  BP:  127/76 124/71 132/63  Pulse: 85 (!) 103 (!) 113 (!) 108  Resp: 20 20    Temp:  98.2 F (36.8 C) 99.5 F (37.5 C) (!) 100.5 F (38.1 C)  TempSrc:      SpO2: 100% 100% 100% 96%  Weight:      Height:       Physical Exam HENT:     Head: Normocephalic.     Mouth/Throat:     Pharynx: No oropharyngeal  exudate.  Eyes:     General: Lids are normal.     Conjunctiva/sclera: Conjunctivae normal.  Cardiovascular:     Rate and Rhythm: Regular rhythm. Tachycardia present.     Heart sounds: Normal heart sounds, S1 normal and S2 normal.  Pulmonary:     Breath sounds: Examination of the right-lower field reveals decreased  breath sounds and rhonchi. Examination of the left-lower field reveals decreased breath sounds and rhonchi. Decreased breath sounds and rhonchi present. No wheezing or rales.  Abdominal:     Palpations: Abdomen is soft.     Tenderness: There is no abdominal tenderness.  Musculoskeletal:     Right lower leg: Swelling present.     Left lower leg: No swelling.  Skin:    General: Skin is warm.     Findings: No rash.  Neurological:     Mental Status: He is alert and oriented to person, place, and time.     Data Reviewed: Creatinine 0.67, alkaline phosphatase 534, AST 70, ALT 48, total bilirubin 0.7, white blood cell count 20.3, hemoglobin 9.3, platelet count 471  Family Communication: Family at bedside  Disposition: Status is: Inpatient Remains inpatient appropriate because: Fever of over 102 last night  Planned Discharge Destination: Home    Time spent: 28 minutes  Author: Alford Highland, MD 03/06/2023 2:10 PM  For on call review [email or web address].

## 2023-03-07 ENCOUNTER — Other Ambulatory Visit: Payer: Self-pay

## 2023-03-07 ENCOUNTER — Other Ambulatory Visit: Payer: Self-pay | Admitting: Oncology

## 2023-03-07 DIAGNOSIS — I3139 Other pericardial effusion (noninflammatory): Secondary | ICD-10-CM | POA: Diagnosis not present

## 2023-03-07 DIAGNOSIS — F419 Anxiety disorder, unspecified: Secondary | ICD-10-CM | POA: Insufficient documentation

## 2023-03-07 DIAGNOSIS — A419 Sepsis, unspecified organism: Secondary | ICD-10-CM | POA: Diagnosis not present

## 2023-03-07 DIAGNOSIS — K8689 Other specified diseases of pancreas: Secondary | ICD-10-CM | POA: Diagnosis not present

## 2023-03-07 DIAGNOSIS — I2699 Other pulmonary embolism without acute cor pulmonale: Secondary | ICD-10-CM | POA: Diagnosis not present

## 2023-03-07 DIAGNOSIS — R7989 Other specified abnormal findings of blood chemistry: Secondary | ICD-10-CM

## 2023-03-07 DIAGNOSIS — J189 Pneumonia, unspecified organism: Secondary | ICD-10-CM | POA: Diagnosis not present

## 2023-03-07 LAB — HEPATIC FUNCTION PANEL
ALT: 37 U/L (ref 0–44)
AST: 31 U/L (ref 15–41)
Albumin: 2 g/dL — ABNORMAL LOW (ref 3.5–5.0)
Alkaline Phosphatase: 463 U/L — ABNORMAL HIGH (ref 38–126)
Bilirubin, Direct: 0.1 mg/dL (ref 0.0–0.2)
Indirect Bilirubin: 0.4 mg/dL (ref 0.3–0.9)
Total Bilirubin: 0.5 mg/dL (ref <1.2)
Total Protein: 6.5 g/dL (ref 6.5–8.1)

## 2023-03-07 LAB — CBC
HCT: 29.5 % — ABNORMAL LOW (ref 39.0–52.0)
Hemoglobin: 9.1 g/dL — ABNORMAL LOW (ref 13.0–17.0)
MCH: 27 pg (ref 26.0–34.0)
MCHC: 30.8 g/dL (ref 30.0–36.0)
MCV: 87.5 fL (ref 80.0–100.0)
Platelets: 509 10*3/uL — ABNORMAL HIGH (ref 150–400)
RBC: 3.37 MIL/uL — ABNORMAL LOW (ref 4.22–5.81)
RDW: 14.1 % (ref 11.5–15.5)
WBC: 21.7 10*3/uL — ABNORMAL HIGH (ref 4.0–10.5)
nRBC: 0 % (ref 0.0–0.2)

## 2023-03-07 LAB — MAGNESIUM: Magnesium: 2.3 mg/dL (ref 1.7–2.4)

## 2023-03-07 LAB — GLUCOSE, CAPILLARY
Glucose-Capillary: 112 mg/dL — ABNORMAL HIGH (ref 70–99)
Glucose-Capillary: 240 mg/dL — ABNORMAL HIGH (ref 70–99)

## 2023-03-07 MED ORDER — ENSURE ENLIVE PO LIQD: 237.0000 mL | Freq: Two times a day (BID) | ORAL | 0 refills | Status: DC

## 2023-03-07 MED ORDER — METOPROLOL TARTRATE 50 MG PO TABS: 50.0000 mg | ORAL_TABLET | Freq: Two times a day (BID) | ORAL | 0 refills | Status: DC

## 2023-03-07 MED ORDER — NALOXONE HCL 4 MG/0.1ML NA LIQD: NASAL | 0 refills | Status: DC

## 2023-03-07 MED ORDER — ALPRAZOLAM 0.5 MG PO TABS: 0.5000 mg | ORAL_TABLET | Freq: Two times a day (BID) | ORAL | 0 refills | Status: DC | PRN

## 2023-03-07 MED ORDER — HYDROMORPHONE HCL 4 MG PO TABS: 4.0000 mg | ORAL_TABLET | ORAL | 0 refills | Status: AC | PRN

## 2023-03-07 MED ORDER — TOUJEO SOLOSTAR 300 UNIT/ML ~~LOC~~ SOPN: 30.0000 [IU] | PEN_INJECTOR | Freq: Every day | SUBCUTANEOUS | Status: DC

## 2023-03-07 MED ORDER — SENNOSIDES-DOCUSATE SODIUM 8.6-50 MG PO TABS: 2.0000 | ORAL_TABLET | Freq: Every day | ORAL | 0 refills | Status: DC

## 2023-03-07 MED ORDER — HYDROMORPHONE HCL 2 MG PO TABS: 4.0000 mg | ORAL_TABLET | ORAL | Status: DC | PRN

## 2023-03-07 MED ORDER — HYDROMORPHONE HCL 2 MG PO TABS: 4.0000 mg | ORAL_TABLET | ORAL | 0 refills | Status: DC | PRN

## 2023-03-07 MED ORDER — ACETAMINOPHEN 650 MG RE SUPP: 650.0000 mg | Freq: Four times a day (QID) | RECTAL | Status: DC | PRN

## 2023-03-07 MED ORDER — APIXABAN 5 MG PO TABS: ORAL_TABLET | ORAL | 0 refills | Status: DC

## 2023-03-07 MED ORDER — LEVOFLOXACIN 750 MG PO TABS: 750.0000 mg | ORAL_TABLET | Freq: Every evening | ORAL | 0 refills | Status: DC

## 2023-03-07 MED ORDER — HYDROMORPHONE HCL 4 MG PO TABS: 4.0000 mg | ORAL_TABLET | ORAL | 0 refills | Status: DC | PRN

## 2023-03-07 MED ORDER — INSULIN ASPART 100 UNIT/ML FLEXPEN: 5.0000 [IU] | PEN_INJECTOR | Freq: Three times a day (TID) | SUBCUTANEOUS | 0 refills | Status: DC

## 2023-03-07 MED ORDER — ACETAMINOPHEN 325 MG PO TABS: 650.0000 mg | ORAL_TABLET | Freq: Four times a day (QID) | ORAL | Status: DC | PRN

## 2023-03-07 NOTE — Progress Notes (Signed)
Pharmacist Chemotherapy Monitoring - Initial Assessment    Anticipated start date: 03/12/23   The following has been reviewed per standard work regarding the patient's treatment regimen: The patient's diagnosis, treatment plan and drug doses, and organ/hematologic function Lab orders and baseline tests specific to treatment regimen  The treatment plan start date, drug sequencing, and pre-medications Prior authorization status  Patient's documented medication list, including drug-drug interaction screen and prescriptions for anti-emetics and supportive care specific to the treatment regimen The drug concentrations, fluid compatibility, administration routes, and timing of the medications to be used The patient's access for treatment and lifetime cumulative dose history, if applicable  The patient's medication allergies and previous infusion related reactions, if applicable   Changes made to treatment plan:  Note w/ C1, MD removed Day 8 - future cycles include Day 8 (unsigned)  Follow up needed:  prescriptions needed for anti-emetics   Ebony Hail, Pharm.D., CPP 03/07/2023@10 :30 AM

## 2023-03-07 NOTE — Progress Notes (Signed)
Patient is ready for discharge. I have reviewed discharge instructions with patient's daughter and wife. Patient's IV has been removed without complication and he has been taken off telemetry. He will be transported home by his family.

## 2023-03-07 NOTE — Assessment & Plan Note (Signed)
Patient also requested a prescription for Xanax upon going home.  5-day supply ordered enough to get him to follow-up appointment.

## 2023-03-07 NOTE — Progress Notes (Addendum)
Patient pharmacy did not have the dilaudid.  Asked to send to [facility] [address]  Able to e-scribe. Dr Renae Gloss

## 2023-03-07 NOTE — Discharge Summary (Addendum)
Physician Discharge Summary   Patient: Tony Benson MRN: [id number] DOB: 06/25/1960  Admit date:     02/28/2023  Discharge date: 03/07/23  Discharge Physician: Alford Highland   PCP: Sherrie Mustache, MD   Recommendations at discharge:   Follow-up with Dr. Donneta Romberg as scheduled on 11/27. Follow-up PCP 1 week  Discharge Diagnoses: Principal Problem:   Sepsis due to pneumonia [facility]) Active Problems:   Acute pulmonary embolism (HCC)   Mass of head of pancreas   Pericardial effusion   Uncontrolled type 2 diabetes mellitus with hyperglycemia, with long-term current use of insulin (HCC)   Essential hypertension   Dyslipidemia   Asthma, chronic, unspecified asthma severity, with acute exacerbation   Protein-calorie malnutrition, severe   Tachycardia   Normocytic anemia   Elevated LFTs   Anxiety  Hospital Course: 62 year old with history of asthma, OSA on CPAP, DM2, prostate cancer comes to the ED for worsening dyspnea. Patient was recently diagnosed with right lower extremity DVT and placed on Eliquis. CTA chest shows small nonocclusive left upper lobe PE with moderate to large circumferential pericardial effusion and right-sided pleural effusion with groundglass opacity. Also found to have a right hepatic mass of 12.6 cm. MRI liver eventually showed concern for pancreatic head mass with multiple liver lesions.  Patient had ultrasound-guided liver biopsy on 11/18.  11/20.  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  Hemoglobin dipped down to 8.1.  Check hemoglobin again tomorrow. 11/21.  Patient had a fever overnight of above 102.  Unasyn started switched over to Maxipime 11/22.  Patient wanting to go home today.  No further fever.  Will receive a dose of Maxipime prior to disposition and switch to Levaquin upon discharge.  Assessment and Plan: * Sepsis due to  pneumonia [facility]) This was present on admission.  Patient completed 62 days of antibiotics of Rocephin and Zithromax.  Patient had another fever of 102 overnight on 11/20.  Chest x-ray showing worsening pneumonia.  Patient started on Unasyn but switched over to Maxipime.  Patient feeling better and no further fevers and wanted to go home.  Will give a dose of Maxipime prior to discharge and discharged home on Levaquin for 5 days every evening.  Mass of head of pancreas With liver metastases.  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  Will need close follow-up with oncology as outpatient.  This is stage IV cancer and all treatments will be palliative in nature.  Patient is a full code at this point.  Overall prognosis poor with metastatic cancer.  Patient prescribed 5 days of Dilaudid to get him to his follow-up appointment with oncology.  And also Narcan if needed.  Acute pulmonary embolism (HCC) Acute pulmonary embolism without cor pulmonale, bilateral lower extremity DVT.  Patient was on heparin drip and switched over to Lovenox injections.  Case discussed with Dr. Donneta Romberg oncology and Lovenox injections are pretty expensive for the patient.  Eliquis 10 mg twice a day for 5 more days and then 5 mg twice a day after that.  Pericardial effusion Moderate pericardial effusion no evidence of tamponade on echo  Uncontrolled type 2 diabetes mellitus with hyperglycemia, with long-term current use of insulin (HCC) Hemoglobin A1c elevated at 10.3.  Patient on sliding scale insulin +5 units prior to meals, patient also on Semglee insulin 30 units daily.  Essential hypertension Continue Cozaar, Norvasc and patient also on metoprolol.  On increased metoprolol dose with tachycardia.  Anxiety Patient also requested a prescription for Xanax upon going home.  5-day supply ordered enough to get him to follow-up appointment.  Elevated LFTs Likely  secondary to liver mets.  Alkaline phosphatase upon discharge 463, AST 31 and ALT 37 and total bilirubin 0.5.  AST and ALT normalized.  Alkaline phosphatase trending down.  Normocytic anemia Suspect anemia from chronic disease with cancer diagnosis.  Hemoglobin 9.1 upon discharge.  Tachycardia Sinus tachycardia.  Increased metoprolol to 50 mg twice a day.  Protein-calorie malnutrition, severe Continue supplements  Asthma, chronic, unspecified asthma severity, with acute exacerbation Continue inhalers as outpatient.  Dyslipidemia Continue Lipitor         Consultants: Oncology, palliative care, cardiology Procedures performed: None Disposition: Home Diet recommendation:  Cardiac and Carb modified diet DISCHARGE MEDICATION: Allergies as of 03/07/2023       Reactions   Other Anaphylaxis   peanuts   Morphine And Codeine Itching   Tree Extract Other (See Comments)   ALMONDS  ---- EYE/ FACIAL SWELLING   Wound Dressing Adhesive Rash        Medication List     STOP taking these medications    Apixaban Starter Pack (10mg  and 5mg ) Commonly known as: ELIQUIS STARTER PACK Replaced by: apixaban 5 MG Tabs tablet   Klor-Con M20 20 MEQ tablet Generic drug: potassium chloride SA   metFORMIN 1000 MG tablet Commonly known as: GLUCOPHAGE   Rybelsus 7 MG Tabs Generic drug: Semaglutide   sildenafil 50 MG tablet Commonly known as: VIAGRA       TAKE these medications    ALPRAZolam 0.5 MG tablet Commonly known as: XANAX Take 1 tablet (0.5 mg total) by mouth 2 (two) times daily as needed for anxiety.   amLODipine 10 MG tablet Commonly known as: NORVASC Take 10 mg by mouth daily.   apixaban 5 MG Tabs tablet Commonly known as: ELIQUIS Take 2 tablets (10 mg total) by mouth 2 (two) times daily for 5 days, THEN 1 tablet (5 mg total) 2 (two) times daily for 25 days. Start taking on: March 07, 2023 Replaces: Apixaban Starter Pack (10mg  and 5mg )   atorvastatin 10 MG  tablet Commonly known as: LIPITOR Take 10 mg by mouth daily.   Dulera 100-5 MCG/ACT Aero Generic drug: mometasone-formoterol Inhale 2 puffs into the lungs 2 (two) times daily as needed for wheezing or shortness of breath.   feeding supplement Liqd Take 237 mLs by mouth 2 (two) times daily between meals.   Fish Oil 1000 MG Caps Take 1,000 mg by mouth daily.   HYDROmorphone 4 MG tablet Commonly known as: DILAUDID Take 1 tablet (4 mg total) by mouth every 4 (four) hours as needed for up to 5 days for severe pain (pain score 7-10).   insulin aspart 100 UNIT/ML FlexPen Commonly known as: NOVOLOG Inject 5 Units into the skin 3 (three) times daily with meals. If eating and Blood Glucose (BG) 80 or higher inject 5 units for meal coverage and add correction dose per scale. If not eating, correction dose only. BG <150= 0 unit; BG 150-200= 1 unit; BG 201-250= 2 unit; BG 251-300= 3 unit; BG 301-350= 4 unit; BG 351-400= 5 unit; BG >400= 6 unit and Call Primary care.   Jardiance 25 MG Tabs tablet Generic drug: empagliflozin Take 25 mg by mouth daily.   levofloxacin 750 MG tablet Commonly known as: Levaquin Take 1 tablet (750 mg total) by mouth every evening for 5  days.   losartan 100 MG tablet Commonly known as: COZAAR Take 100 mg by mouth daily.   metoprolol tartrate 50 MG tablet Commonly known as: LOPRESSOR Take 1 tablet (50 mg total) by mouth 2 (two) times daily.   naloxone 4 MG/0.1ML Liqd nasal spray kit Commonly known as: NARCAN One spray in nostril for unresponsiveness   senna-docusate 8.6-50 MG tablet Commonly known as: Senokot-S Take 2 tablets by mouth at bedtime. What changed:  how much to take when to take this   Toujeo SoloStar 300 UNIT/ML Solostar Pen Generic drug: insulin glargine (1 Unit Dial) Inject 30 Units into the skin daily. What changed: See the new instructions.   Vitamin D (Ergocalciferol) 1.25 MG (50000 UNIT) Caps capsule Commonly known as:  DRISDOL Take 50,000 Units by mouth every 7 (seven) days.        Follow-up Information     Earna Coder, MD Follow up.   Specialties: Internal Medicine, Oncology Why: keep appointment on the 27th Contact information: [address] [place] [address] [phone]         Sherrie Mustache, MD Follow up in 1 week(s).   Specialty: Internal Medicine Contact information: [address] [place] [address] [phone]                Discharge Exam: Ceasar Mons Weights   02/27/23 2008 03/03/23 0434 03/03/23 1324  Weight: 68 kg 67.5 kg 67.5 kg   Physical Exam HENT:     Head: Normocephalic.     Mouth/Throat:     Pharynx: No oropharyngeal exudate.  Eyes:     General: Lids are normal.     Conjunctiva/sclera: Conjunctivae normal.  Cardiovascular:     Rate and Rhythm: Regular rhythm. Tachycardia present.     Heart sounds: Normal heart sounds, S1 normal and S2 normal.  Pulmonary:     Breath sounds: Examination of the right-lower field reveals decreased breath sounds. Examination of the left-lower field reveals decreased breath sounds. Decreased breath sounds present. No wheezing, rhonchi or rales.  Abdominal:     Palpations: Abdomen is soft.     Tenderness: There is no abdominal tenderness.  Musculoskeletal:     Right lower leg: Swelling present.     Left lower leg: No swelling.  Skin:    General: Skin is warm.     Findings: No rash.  Neurological:     Mental Status: He is alert and oriented to person, place, and time.      Condition at discharge: fair  The results of significant diagnostics from this hospitalization (including imaging, microbiology, ancillary and laboratory) are listed below for reference.   Imaging Studies: DG Chest Port 1 View  Result Date: 03/05/2023 CLINICAL DATA:  Shortness of breath.  Follow-up exam. EXAM: PORTABLE CHEST 1 VIEW COMPARISON:  Chest radiograph dated 02/27/2023. FINDINGS: Small right pleural effusion.  Right lung base opacity as well as faint clusters of ground-glass density involving the right mid to lower lung field consistent with pneumonia. Overall interval worsening of pulmonary infiltrate since the radiograph of 02/27/2023. The left lung is clear. No pneumothorax. Stable cardiac silhouette. No acute osseous pathology. IMPRESSION: Interval worsening of right lung pneumonia. Electronically Signed   By: Elgie Collard M.D.   On: 03/05/2023 23:52   ECHOCARDIOGRAM LIMITED  Result Date: 03/04/2023    ECHOCARDIOGRAM LIMITED REPORT   Patient Name:   Tony Benson Date of Exam: 03/04/2023 Medical Rec #:  [id number]     Height:  69.0 in Accession #:    [id number]    Weight:       148.8 lb Date of Birth:  Aug 21, 1960    BSA:          1.822 m Patient Age:    61 years      BP:           117/60 mmHg Patient Gender: M             HR:           112 bpm. Exam Location:  [facility] Procedure: Limited Echo, Color Doppler and Cardiac Doppler Indications:     Pericardial Effusion I31.3  History:         Patient has prior history of Echocardiogram examinations, most                  recent 03/01/2023. Risk Factors:Diabetes, Hypertension and                  Sleep Apnea.  Sonographer:     Cristela Blue Referring Phys:  [id number] Marrion Coy Diagnosing Phys: Yvonne Kendall MD IMPRESSIONS  1. Left ventricular ejection fraction, by estimation, is >55%. The left ventricle has normal function. The left ventricle has no regional wall motion abnormalities.  2. Right ventricular systolic function is normal. The right ventricular size is normal.  3. Moderate pericardial effusion. The pericardial effusion is circumferential. There is no evidence of cardiac tamponade.  4. The mitral valve is normal in structure. Comparison(s): A prior study was performed on 03/01/2023. Pericardial effusion appears slightly smaller. FINDINGS  Left Ventricle: Left ventricular ejection fraction, by estimation, is >55%. The left ventricle has normal function.  The left ventricle has no regional wall motion abnormalities. The left ventricular internal cavity size was normal in size. There is borderline left ventricular hypertrophy. Right Ventricle: The right ventricular size is normal. No increase in right ventricular wall thickness. Right ventricular systolic function is normal. Pericardium: A moderately sized pericardial effusion is present. The pericardial effusion is circumferential. There is no evidence of cardiac tamponade. Mitral Valve: The mitral valve is normal in structure. Tricuspid Valve: The tricuspid valve is grossly normal. Tricuspid valve regurgitation is mild. Aorta: The aortic root is normal in size and structure. Venous: The inferior vena cava was not well visualized. Additional Comments: Spectral Doppler performed. Color Doppler performed.  LEFT VENTRICLE PLAX 2D LVIDd:         4.30 cm LVIDs:         2.30 cm LV PW:         1.12 cm LV IVS:        0.95 cm   AORTA Ao Root diam: 3.30 cm Yvonne Kendall MD Electronically signed by Yvonne Kendall MD Signature Date/Time: 03/04/2023/11:43:49 AM    Final    IR US LIVER BIOPSY  Result Date: 03/03/2023 INDICATION: 62 year old male with pancreatic mass and concern for hepatic metastatic disease. EXAM: LIVER CORE BIOPSY MEDICATIONS: None. ANESTHESIA/SEDATION: Moderate (conscious) sedation was employed during this procedure. A total of Versed 1 mg and Fentanyl 50 mcg was administered intravenously. Moderate Sedation Time: 10 minutes. The patient's level of consciousness and vital signs were monitored continuously by radiology nursing throughout the procedure under my direct supervision. FLUOROSCOPY TIME:  None. COMPLICATIONS: None immediate. PROCEDURE: Informed written consent was obtained from the patient after a thorough discussion of the procedural risks, benefits and alternatives. All questions were addressed. Maximal Sterile Barrier Technique was utilized including caps, mask, sterile gowns, sterile  gloves, sterile drape, hand hygiene and skin antiseptic. A timeout was performed prior to the initiation of the procedure. The right upper quadrant was interrogated with ultrasound. Multiple echogenic masses present throughout the liver. A suitable skin entry site was selected and marked. The region was sterilely prepped and draped in the standard fashion using chlorhexidine skin prep. Local anesthesia was attained by infiltration with 1% lidocaine. A small dermatotomy was made. Under real-time sonographic guidance, a 17 gauge introducer needle was advanced into the margin of the mass. Multiple 18 gauge biopsies were then coaxially obtained using the BioPince automated biopsy device. Biopsy samples were placed in formalin and delivered to pathology for further analysis. As a 17 gauge introducer needle was removed, the biopsy tract was embolized with a Gel-Foam slurry. Post biopsy ultrasound imaging demonstrates no evidence of active hemorrhage or perihepatic hematoma. The patient tolerated the procedure well. IMPRESSION: Technically successful ultrasound-guided core biopsy of hepatic lesion. Electronically Signed   By: Malachy Moan M.D.   On: 03/03/2023 14:59   ECHOCARDIOGRAM COMPLETE  Result Date: 03/01/2023    ECHOCARDIOGRAM REPORT   Patient Name:   Tony Benson Date of Exam: 03/01/2023 Medical Rec #:  [id number]     Height:       69.0 in Accession #:    [id number]    Weight:       149.9 lb Date of Birth:  February 03, 1961    BSA:          1.828 m Patient Age:    61 years      BP:           138/75 mmHg Patient Gender: M             HR:           116 bpm. Exam Location:  [facility] Procedure: 2D Echo Indications:     Pulmonary Embolus I26.09  History:         Patient has no prior history of Echocardiogram examinations.  Sonographer:     Overton Mam RDCS, FASE Referring Phys:  [id number] Vernetta Honey MANSY Diagnosing Phys: Julien Nordmann MD IMPRESSIONS  1. Left ventricular ejection fraction, by estimation, is 60 to 65%.  The left ventricle has normal function. The left ventricle has no regional wall motion abnormalities. Left ventricular diastolic parameters are indeterminate.  2. Right ventricular systolic function is normal. The right ventricular size is normal. There is moderately elevated pulmonary artery systolic pressure. The estimated right ventricular systolic pressure is 54.0 mmHg.  3. Large pericardial effusion. The pericardial effusion is circumferential. There is no evidence of cardiac tamponade. 1.95 off the RV free wall, 2.16 cm off the apical region, 1.70 cm off the LV free wall. IVC is not dilated at 1.16 cm  4. The mitral valve is normal in structure. No evidence of mitral valve regurgitation. No evidence of mitral stenosis.  5. The aortic valve is tricuspid. Aortic valve regurgitation is not visualized. No aortic stenosis is present.  6. The inferior vena cava is normal in size with greater than 50% respiratory variability, suggesting right atrial pressure of 3 mmHg. FINDINGS  Left Ventricle: Left ventricular ejection fraction, by estimation, is 60 to 65%. The left ventricle has normal function. The left ventricle has no regional wall motion abnormalities. The left ventricular internal cavity size was normal in size. There is  no left ventricular hypertrophy. Left ventricular diastolic parameters are indeterminate. Right Ventricle: The right ventricular size is normal. No increase in right ventricular  wall thickness. Right ventricular systolic function is normal. There is moderately elevated pulmonary artery systolic pressure. The tricuspid regurgitant velocity is 3.50 m/s, and with an assumed right atrial pressure of 5 mmHg, the estimated right ventricular systolic pressure is 54.0 mmHg. Left Atrium: Left atrial size was normal in size. Right Atrium: Right atrial size was normal in size. Pericardium: A large pericardial effusion is present. The pericardial effusion is circumferential. There is no evidence of  cardiac tamponade. Mitral Valve: The mitral valve is normal in structure. No evidence of mitral valve regurgitation. No evidence of mitral valve stenosis. Tricuspid Valve: The tricuspid valve is normal in structure. Tricuspid valve regurgitation is mild . No evidence of tricuspid stenosis. Aortic Valve: The aortic valve is tricuspid. Aortic valve regurgitation is not visualized. No aortic stenosis is present. Aortic valve peak gradient measures 12.0 mmHg. Pulmonic Valve: The pulmonic valve was normal in structure. Pulmonic valve regurgitation is not visualized. No evidence of pulmonic stenosis. Aorta: The aortic root is normal in size and structure. Venous: The inferior vena cava is normal in size with greater than 50% respiratory variability, suggesting right atrial pressure of 3 mmHg. IAS/Shunts: No atrial level shunt detected by color flow Doppler.  LEFT VENTRICLE PLAX 2D LVIDd:         4.60 cm   Diastology LVIDs:         2.90 cm   LV e' medial:    11.70 cm/s LV PW:         1.00 cm   LV E/e' medial:  8.8 LV IVS:        1.00 cm   LV e' lateral:   18.60 cm/s LVOT diam:     2.10 cm   LV E/e' lateral: 5.5 LV SV:         67 LV SV Index:   37 LVOT Area:     3.46 cm  RIGHT VENTRICLE RV Basal diam:  2.50 cm RV S prime:     22.30 cm/s TAPSE (M-mode): 1.8 cm LEFT ATRIUM             Index        RIGHT ATRIUM          Index LA diam:        2.70 cm 1.48 cm/m   RA Area:     7.05 cm LA Vol (A2C):   45.8 ml 25.06 ml/m  RA Volume:   9.68 ml  5.30 ml/m LA Vol (A4C):   25.5 ml 13.95 ml/m LA Biplane Vol: 35.4 ml 19.37 ml/m  AORTIC VALVE                 PULMONIC VALVE AV Area (Vmax): 2.46 cm     PV Vmax:        1.10 m/s AV Vmax:        173.00 cm/s  PV Peak grad:   4.8 mmHg AV Peak Grad:   12.0 mmHg    RVOT Peak grad: 4 mmHg LVOT Vmax:      123.00 cm/s LVOT Vmean:     79.500 cm/s LVOT VTI:       0.193 m  AORTA                        PULMONARY ARTERY Ao Root diam: 3.50 cm        MPA diam:        3.00 cm Ao Asc diam:  2.70 cm  MITRAL  VALVE                TRICUSPID VALVE MV Area (PHT): 7.22 cm     TR Peak grad:   49.0 mmHg MV Decel Time: 105 msec     TR Vmax:        350.00 cm/s MV E velocity: 103.00 cm/s MV A velocity: 87.50 cm/s   SHUNTS MV E/A ratio:  1.18         Systemic VTI:  0.19 m                             Systemic Diam: 2.10 cm Julien Nordmann MD Electronically signed by Julien Nordmann MD Signature Date/Time: 03/01/2023/3:02:54 PM    Final    MR LIVER W WO CONTRAST  Result Date: 03/01/2023 CLINICAL DATA:  62 year old male with history of shortness of breath recently diagnosed with blood clots in the legs. Hepatic masses identified on recent chest CTA. Further evaluation to exclude the possibility of malignancy. EXAM: MRI ABDOMEN WITHOUT AND WITH CONTRAST TECHNIQUE: Multiplanar multisequence MR imaging of the abdomen was performed both before and after the administration of intravenous contrast. CONTRAST:  7mL GADAVIST GADOBUTROL 1 MMOL/ML IV SOLN COMPARISON:  No prior abdominal MRI.  Chest CTA 02/27/2023. FINDINGS: Comment: Portions of today's examination are limited by considerable patient respiratory motion. Lower chest: Small right pleural effusion with areas of atelectasis and/or scarring in the right lung base. Hepatobiliary: There are multiple hepatic lesions highly concerning for widespread metastatic disease to the liver. The largest of these is a dominant lesion in the central aspect of the liver, predominantly centered in segment 8 (axial image 15 of series 4 and coronal image 22 series 3) measuring up to 13.7 x 9.6 x 12.1 cm. These lesions are heterogeneous in signal intensity but generally T1 hypointense, slightly T2 hyperintense, hypovascular with predominantly peripheral enhancement which increases slightly on delayed images, and extensive internal diffusion restriction. No definite hypervascular hepatic lesion is identified. Gallbladder is not visualized, likely surgically absent. No intra or extrahepatic  biliary ductal dilatation. Pancreas: In the region of the pancreatic head (best appreciated on axial image 55 of series 14 and coronal image 45 of series 20) there is what appears to be a poorly defined hypovascular mass estimated to measure approximately 4.0 x 2.6 x 2.9 cm, which is located immediately anterior and lateral to the superior mesenteric vein adjacent to the splenic pulmonary confluence, and inferior to the proximal portal vein, with minimal obscuration of the intervening fat plane adjacent to the superior mesenteric vein. This lesion appears to obstruct the main pancreatic duct (axial image 49 of series 14), proximal to which the main pancreatic duct is severely dilated measuring up to 12 mm in the body of the pancreas. Diffuse atrophy throughout the body and tail of the pancreas is noted. 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 Spleen:  Unremarkable. Adrenals/Urinary Tract: Multiple T1 hypointense, T2 hyperintense, nonenhancing lesions in the kidneys are compatible with simple cysts (Bosniak class 1), which require no imaging follow-up, largest of which measure up to approximately 2.2 cm in diameter in the lower pole of the left kidney. No aggressive appearing renal lesions. No hydroureteronephrosis in the visualized portions of the abdomen.  Bilateral adrenal glands are normal in appearance. Stomach/Bowel: Visualized portions are unremarkable. Vascular/Lymphatic: No aneurysm identified in the visualized abdominal vasculature. No definite lymphadenopathy confidently identified in the abdomen. Other:  Trace volume of perihepatic ascites. Musculoskeletal: No aggressive appearing osseous lesions are noted in the visualized portions of  the skeleton. IMPRESSION: 1. Large hypovascular mass in the region of the head of the pancreas causing occlusion of the main pancreatic duct, highly concerning for primary pancreatic neoplasm. Numerous hypovascular lesions scattered throughout the liver, presumably reflective of widespread metastatic disease to the liver, with the largest lesion a dominant mass centered in the right lobe of the liver measuring up to 13.7 x 9.6 x 12.1 cm. 2. Trace volume of perihepatic ascites. 3. Fluid collection anterior to the mass adjacent to the head of the pancreas, presumably a proteinaceous/hemorrhagic pancreatic pseudocyst. 4. Small right pleural effusion. 5. Additional incidental findings, as above. Electronically Signed   By: Trudie Reed M.D.   On: 03/01/2023 05:35   CT Angio Chest PE W and/or Wo Contrast  Result Date: 02/27/2023 CLINICAL DATA:  Shortness of breath blood clots in leg EXAM: CT ANGIOGRAPHY CHEST WITH CONTRAST TECHNIQUE: Multidetector CT imaging of the chest was performed using the standard protocol during bolus administration of intravenous contrast. Multiplanar CT image reconstructions and MIPs were obtained to evaluate the vascular anatomy. RADIATION DOSE REDUCTION: This exam was performed according to the departmental dose-optimization program which includes automated exposure control, adjustment of the mA and/or kV according to patient size and/or use of iterative reconstruction technique. CONTRAST:  75mL OMNIPAQUE IOHEXOL 350 MG/ML SOLN COMPARISON:  Chest x-ray 02/27/2023 FINDINGS: Cardiovascular: Satisfactory opacification of the pulmonary arteries to the segmental level. Small nonocclusive segmental/subsegmental embolus within the left upper lobe, series 6, image 168. No other discrete emboli are visualized. Mild atherosclerosis. No aneurysm or dissection. Normal cardiac size. Moderate to large circumferential pericardial effusion measuring up to 2.1 cm, slight increased density values  suggesting complex fluid. Mediastinum/Nodes: Midline trachea. No thyroid mass. Subcarinal nodes measuring at least up to 2 cm. Esophagus within normal limits. Small clips in the posterior mediastinum. Lungs/Pleura: Small circumferential right pleural effusion. Calcifications or sutures at the posterior right lung base. Prominent peribronchovascular thickening within the right perihilar region and within the right middle and lower lobe bronchi. Multifocal bilateral ground-glass densities and areas of tree-in-bud density suspicious for respiratory infection Upper Abdomen: Large hypodense right hepatic mass suspected, this measures at least 12.6 cm. Another hypodense inferior right hepatic lobe lesion measuring 2.8 cm on series 4, image 153 incompletely assessed. Musculoskeletal: No acute osseous abnormality. Review of the MIP images confirms the above findings. IMPRESSION: 1. Positive for small nonocclusive segmental/subsegmental pulmonary embolus within the left upper lobe. 2. Moderate to large circumferential pericardial effusion measuring up to 2.1 cm, slight increased density values suggesting complex fluid. Recommend correlation with echocardiogram 3. Small circumferential right pleural effusion. Prominent peribronchovascular thickening within the right perihilar region and within the right upper, middle and lower lobe bronchi, with mild mucous plugging in the right lower lobe. Multifocal bilateral ground-glass densities and areas of tree-in-bud density suspicious for respiratory infection/pneumonia, possible atypical infection. 4. Subcarinal adenopathy, reactive versus metastatic. 5. Right hepatic masses measuring up to 12.6 cm. Findings are suspicious for metastatic disease versus liver carcinoma. When the patient is clinically stable and able to follow directions and hold their breath (preferably as an outpatient) further evaluation with dedicated abdominal MRI should be considered. Critical Value/emergent  results were called by telephone at the  time of interpretation on 02/27/2023 at 11:02 pm to provider [facility] , who verbally acknowledged these results. Electronically Signed   By: Jasmine Pang M.D.   On: 02/27/2023 23:04   DG Chest 2 View  Result Date: 02/27/2023 CLINICAL DATA:  Shortness of breath EXAM: CHEST - 2 VIEW COMPARISON:  08/04/2013 FINDINGS: Left lung is grossly clear. Normal cardiac size. Small loculated pleural effusion versus pleural thickening. Airspace disease at the right middle lobe and right base. No pneumothorax IMPRESSION: Airspace disease at the right middle lobe and right base, possible pneumonia. Small loculated pleural effusion versus pleural thickening. Electronically Signed   By: Jasmine Pang M.D.   On: 02/27/2023 22:45   US Venous Img Lower Bilateral (DVT)  Result Date: 02/26/2023 CLINICAL DATA:  Leg swelling for 2 weeks EXAM: BILATERAL LOWER EXTREMITY VENOUS DOPPLER ULTRASOUND TECHNIQUE: Gray-scale sonography with graded compression, as well as color Doppler and duplex ultrasound were performed to evaluate the lower extremity deep venous systems from the level of the common femoral vein and including the common femoral, femoral, profunda femoral, popliteal and calf veins including the posterior tibial, peroneal and gastrocnemius veins when visible. The superficial great saphenous vein was also interrogated. Spectral Doppler was utilized to evaluate flow at rest and with distal augmentation maneuvers in the common femoral, femoral and popliteal veins. COMPARISON:  None Available. FINDINGS: RIGHT LOWER EXTREMITY Common Femoral Vein: No evidence of thrombus. Normal compressibility, respiratory phasicity and response to augmentation. Saphenofemoral Junction: No evidence of thrombus. Normal compressibility and flow on color Doppler imaging. Profunda Femoral Vein: No evidence of thrombus. Normal compressibility and flow on color Doppler imaging. Femoral Vein: Poor flow  on Doppler. Poor compression with hypoechoic material. Popliteal Vein: Poor flow on Doppler with poor compression and luminal hypoechoic appearance Calf Veins: Areas of thrombus along the posterior tibial and peroneal vein. Superficial Great Saphenous Vein: No evidence of thrombus. Normal compressibility. Venous Reflux:  None. Other Findings:  None. LEFT LOWER EXTREMITY Common Femoral Vein: No evidence of thrombus. Normal compressibility, respiratory phasicity and response to augmentation. Saphenofemoral Junction: No evidence of thrombus. Normal compressibility and flow on color Doppler imaging. Profunda Femoral Vein: No evidence of thrombus. Normal compressibility and flow on color Doppler imaging. Femoral Vein: No evidence of thrombus. Normal compressibility, respiratory phasicity and response to augmentation. Popliteal Vein: Incomplete compression and poor flow on Doppler, color and spectral Calf Veins: Areas of thrombus in the peroneal and posterior tibial veins. Superficial Great Saphenous Vein: No evidence of thrombus. Normal compressibility. Venous Reflux:  None. Other Findings:  None. Critical Value/emergent results were called by telephone at the time of interpretation on 02/26/2023 at 6:04 pm to provider [facility] , who verbally acknowledged these results. IMPRESSION: Bilateral DVT. Right femoral vein down through the calf veins. Left popliteal and calf veins. Electronically Signed   By: Karen Kays M.D.   On: 02/26/2023 18:14    Microbiology: Results for orders placed or performed during the hospital encounter of 02/28/23  Resp panel by RT-PCR (RSV, Flu A&B, Covid) Anterior Nasal Swab     Status: None   Collection Time: 02/28/23 12:58 AM   Specimen: Anterior Nasal Swab  Result Value Ref Range Status   SARS Coronavirus 2 by RT PCR NEGATIVE NEGATIVE Final    Comment: (NOTE) SARS-CoV-2 target nucleic acids are NOT DETECTED.  The SARS-CoV-2 RNA is generally detectable in upper  respiratory specimens during the acute phase of infection. The lowest concentration of SARS-CoV-2 viral copies this assay can  detect is 138 copies/mL. A negative result does not preclude SARS-Cov-2 infection and should not be used as the sole basis for treatment or other patient management decisions. A negative result may occur with  improper specimen collection/handling, submission of specimen other than nasopharyngeal swab, presence of viral mutation(s) within the areas targeted by this assay, and inadequate number of viral copies(<138 copies/mL). A negative result must be combined with clinical observations, patient history, and epidemiological information. The expected result is Negative.  Fact Sheet for Patients:  BloggerCourse.com  Fact Sheet for Healthcare Providers:  SeriousBroker.it  This test is no t yet approved or cleared by the [place] FDA and  has been authorized for detection and/or diagnosis of SARS-CoV-2 by FDA under an Emergency Use Authorization (EUA). This EUA will remain  in effect (meaning this test can be used) for the duration of the COVID-19 declaration under Section 564(b)(1) of the Act, 21 U.S.C.section 360bbb-3(b)(1), unless the authorization is terminated  or revoked sooner.       Influenza A by PCR NEGATIVE NEGATIVE Final   Influenza B by PCR NEGATIVE NEGATIVE Final    Comment: (NOTE) The Xpert Xpress SARS-CoV-2/FLU/RSV plus assay is intended as an aid in the diagnosis of influenza from Nasopharyngeal swab specimens and should not be used as a sole basis for treatment. Nasal washings and aspirates are unacceptable for Xpert Xpress SARS-CoV-2/FLU/RSV testing.  Fact Sheet for Patients: BloggerCourse.com  Fact Sheet for Healthcare Providers: SeriousBroker.it  This test is not yet approved or cleared by the [place] FDA and has been  authorized for detection and/or diagnosis of SARS-CoV-2 by FDA under an Emergency Use Authorization (EUA). This EUA will remain in effect (meaning this test can be used) for the duration of the COVID-19 declaration under Section 564(b)(1) of the Act, 21 U.S.C. section 360bbb-3(b)(1), unless the authorization is terminated or revoked.     Resp Syncytial Virus by PCR NEGATIVE NEGATIVE Final    Comment: (NOTE) Fact Sheet for Patients: BloggerCourse.com  Fact Sheet for Healthcare Providers: SeriousBroker.it  This test is not yet approved or cleared by the [place] FDA and has been authorized for detection and/or diagnosis of SARS-CoV-2 by FDA under an Emergency Use Authorization (EUA). This EUA will remain in effect (meaning this test can be used) for the duration of the COVID-19 declaration under Section 564(b)(1) of the Act, 21 U.S.C. section 360bbb-3(b)(1), unless the authorization is terminated or revoked.  Performed at [facility], [address] [place], [place] [address]   Culture, blood (routine x 2)     Status: None   Collection Time: 02/28/23  2:56 AM   Specimen: BLOOD  Result Value Ref Range Status   Specimen Description BLOOD BLOOD LEFT ARM  Final   Special Requests   Final    BOTTLES DRAWN AEROBIC AND ANAEROBIC Blood Culture adequate volume   Culture   Final    NO GROWTH 5 DAYS Performed at [facility], [address] [place], [place] [address]    Report Status 03/05/2023 FINAL  Final  Culture, blood (routine x 2)     Status: None   Collection Time: 02/28/23  3:01 AM   Specimen: BLOOD  Result Value Ref Range Status   Specimen Description BLOOD BLOOD RIGHT ARM  Final   Special Requests   Final    BOTTLES DRAWN AEROBIC AND ANAEROBIC Blood Culture adequate volume   Culture   Final    NO GROWTH 5 DAYS Performed at [facility], [address] [place], [place] [address]     Report Status 03/05/2023 FINAL  Final  Culture, blood (Routine X 2) w Reflex to ID Panel     Status: None (Preliminary result)   Collection Time: 03/05/23 10:03 PM   Specimen: BLOOD  Result Value Ref Range Status   Specimen Description BLOOD BLOOD LEFT ARM  Final   Special Requests   Final    BOTTLES DRAWN AEROBIC AND ANAEROBIC Blood Culture adequate volume   Culture   Final    NO GROWTH 2 DAYS Performed at [facility], [address] [place], [place] [address]    Report Status PENDING  Incomplete  Culture, blood (Routine X 2) w Reflex to ID Panel     Status: None (Preliminary result)   Collection Time: 03/05/23 10:03 PM   Specimen: BLOOD  Result Value Ref Range Status   Specimen Description BLOOD BLOOD RIGHT ARM  Final   Special Requests   Final    BOTTLES DRAWN AEROBIC AND ANAEROBIC Blood Culture results may not be optimal due to an excessive volume of blood received in culture bottles   Culture  Setup Time IN BOTH AEROBIC AND ANAEROBIC BOTTLES  Final   Culture   Final    NO GROWTH 2 DAYS Performed at [facility], [address] [place], [place] [address]    Report Status PENDING  Incomplete    Labs: CBC: Recent Labs  Lab 03/03/23 0555 03/04/23 0420 03/05/23 0456 03/06/23 0619 03/07/23 0248  WBC 18.6* 17.3* 17.8* 20.3* 21.7*  HGB 9.2* 10.3* 8.1* 9.3* 9.1*  HCT 29.0* 32.6* 25.1* 30.4* 29.5*  MCV 86.8 86.2 87.5 88.6 87.5  PLT 353 383 371 471* 509*   Basic Metabolic Panel: Recent Labs  Lab 03/02/23 0235 03/03/23 0555 03/04/23 0420 03/05/23 0456 03/06/23 0619 03/07/23 0248  NA 135 138 135 135 137  --   K 3.8 3.6 3.9 4.1 4.4  --   CL 105 105 104 108 102  --   CO2 22 24 24 23 27   --   GLUCOSE 315* 115* 167* 173* 145*  --   BUN 13 12 12 17 16   --   CREATININE 0.64 0.54* 0.68 0.63 0.67  --   CALCIUM 8.3* 8.8* 8.6* 8.4* 9.2  --   MG 1.9 2.1 2.1 2.2 2.2 2.3  PHOS  --   --  3.1 2.6 3.5  --    Liver Function Tests: Recent Labs  Lab  03/06/23 0619 03/07/23 0248  AST 70* 31  ALT 48* 37  ALKPHOS 534* 463*  BILITOT 0.7 0.5  PROT 6.4* 6.5  ALBUMIN 2.0* 2.0*   CBG: Recent Labs  Lab 03/06/23 1152 03/06/23 1705 03/06/23 2046 03/07/23 0821 03/07/23 1130  GLUCAP 172* 209* 188* 112* 240*    Discharge time spent: greater than 30 minutes.  Signed: Alford Highland, MD [facility] 03/07/2023

## 2023-03-07 NOTE — Progress Notes (Signed)
Tony Benson   DOB:[birth year]   YQ#:[id number]    Subjective: Patient sitting in the chair waiting for his breakfast.  Patient continues to be tachycardic.  No more fevers.  Started on antibiotics status post blood cultures drawn.  His wife by the bedside.  Daughter on the phone.  Objective:  Vitals:   03/07/23 0308 03/07/23 0800  BP: 128/70 124/77  Pulse: (!) 104 (!) 110  Resp: (!) 22 (!) 25  Temp: 98.3 F (36.8 C) 98.6 F (37 C)  SpO2: 95% 95%     Intake/Output Summary (Last 24 hours) at 03/07/2023 1259 Last data filed at 03/07/2023 1026 Gross per 24 hour  Intake 480 ml  Output 600 ml  Net -120 ml     Physical Exam Vitals and nursing note reviewed.  Constitutional:      Comments:     HENT:     Head: Normocephalic and atraumatic.     Mouth/Throat:     Mouth: Mucous membranes are moist.     Pharynx: No oropharyngeal exudate.  Eyes:     Pupils: Pupils are equal, round, and reactive to light.  Cardiovascular:     Rate and Rhythm: Regular rhythm. Tachycardia present.  Pulmonary:     Effort: No respiratory distress.     Breath sounds: No wheezing.     Comments: Decreased breath sounds bilaterally at bases.  No wheeze or crackles Abdominal:     General: Bowel sounds are normal. There is no distension.     Palpations: Abdomen is soft. There is no mass.     Tenderness: There is no abdominal tenderness. There is no guarding or rebound.  Musculoskeletal:        General: No tenderness. Normal range of motion.     Cervical back: Normal range of motion and neck supple.  Skin:    General: Skin is warm.  Neurological:     Mental Status: He is alert and oriented to person, place, and time.  Psychiatric:        Mood and Affect: Affect normal.        Judgment: Judgment normal.      Labs:  Lab Results  Component Value Date   WBC 21.7 (H) 03/07/2023   HGB 9.1 (L) 03/07/2023   HCT 29.5 (L) 03/07/2023   MCV 87.5 03/07/2023   PLT 509 (H) 03/07/2023   NEUTROABS 16.5 (H)  02/24/2023    Lab Results  Component Value Date   NA 137 03/06/2023   K 4.4 03/06/2023   CL 102 03/06/2023   CO2 27 03/06/2023    Studies:  DG Chest Port 1 View  Result Date: 03/05/2023 CLINICAL DATA:  Shortness of breath.  Follow-up exam. EXAM: PORTABLE CHEST 1 VIEW COMPARISON:  Chest radiograph dated 02/27/2023. FINDINGS: Small right pleural effusion. Right lung base opacity as well as faint clusters of ground-glass density involving the right mid to lower lung field consistent with pneumonia. Overall interval worsening of pulmonary infiltrate since the radiograph of 02/27/2023. The left lung is clear. No pneumothorax. Stable cardiac silhouette. No acute osseous pathology. IMPRESSION: Interval worsening of right lung pneumonia. Electronically Signed   By: Elgie Collard M.D.   On: 03/05/2023 23:52    62 y.o. male patient with a history of diabetes currently admitted to hospital for worsening shortness of breath/acute PE.  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  Pathology pending.  CA 19-9-normal; CEA elevated-see discussion below.  Patient keen to start chemotherapy.  Will tentatively plan chemotherapy on 11/26- Gem-Abraxane.   # Episodes of fever/rising white count-rising LFTs also normal bilirubin-question cholangitis versus underlying infection.  LFTs improved.  Patient currently on Unasyn.  Discussed with Dr. Donah Driver cultures negative so far.   # Acute PE/bilateral DVT-on IV heparin-recommend discharge on Lovenox 1 mg SQ twice a day; however given cost issues I think is reasonable to switch over to Eliquis at discharge.  Stable.   # Sinus tachycardia -multifactorial large pericardial effusion noted-no evidence of tamponade. [On echo/CT scan]-status post cardiology evaluation.  Continue IV  fluids holding off any pericardiocentesis.  Stable  # Status post palliative care evaluation-   # I had a long discussion that response rate for pancreatic cancer is about 20-30%.  And patient is at high risk of side effects given his tenuous clinical status.  However patient feels that he would like to try chemotherapy.  I had a long discussion with the patient/patient's daughter and wife regarding significant complicated/worsening of patient's clinical status-and the fact the patient might not be able to walk out of the hospital of his underlying comorbidities/decompensation to get any further chemotherapy which is planned next week.    # Also discussed regarding CODE STATUS and the futility of heroic measures in the context of his borderline performance status.  Also discussed with primary team and palliative care.  # 40 minutes face-to-face with the patient discussing the above plan of care; more than 50% of time spent on prognosis/ natural history; counseling and coordination.   Tony Coder, MD 03/07/2023  12:59 PM

## 2023-03-07 NOTE — Plan of Care (Signed)

## 2023-03-10 ENCOUNTER — Inpatient Hospital Stay: Payer: BC Managed Care – PPO

## 2023-03-10 ENCOUNTER — Telehealth: Payer: Self-pay | Admitting: *Deleted

## 2023-03-10 ENCOUNTER — Other Ambulatory Visit: Payer: Self-pay

## 2023-03-10 DIAGNOSIS — C259 Malignant neoplasm of pancreas, unspecified: Secondary | ICD-10-CM

## 2023-03-10 LAB — CULTURE, BLOOD (ROUTINE X 2)
Culture: NO GROWTH
Culture: NO GROWTH
Special Requests: ADEQUATE

## 2023-03-10 NOTE — Telephone Encounter (Signed)
Received another Disability Clam form for this patient, form completed and sent for doctor signature

## 2023-03-11 ENCOUNTER — Encounter: Payer: Self-pay | Admitting: Internal Medicine

## 2023-03-11 ENCOUNTER — Other Ambulatory Visit: Payer: Self-pay | Admitting: *Deleted

## 2023-03-11 DIAGNOSIS — C259 Malignant neoplasm of pancreas, unspecified: Secondary | ICD-10-CM

## 2023-03-11 NOTE — Telephone Encounter (Signed)
Forms signed and copied for cart. [name] informed that they are ready to be picked up

## 2023-03-11 NOTE — Telephone Encounter (Signed)
Form signed and faxed back to [email or web address]

## 2023-03-12 ENCOUNTER — Telehealth: Payer: Self-pay

## 2023-03-12 ENCOUNTER — Encounter: Payer: Self-pay | Admitting: Internal Medicine

## 2023-03-12 ENCOUNTER — Inpatient Hospital Stay (HOSPITAL_BASED_OUTPATIENT_CLINIC_OR_DEPARTMENT_OTHER): Payer: BC Managed Care – PPO | Admitting: Hospice and Palliative Medicine

## 2023-03-12 ENCOUNTER — Telehealth: Payer: Self-pay | Admitting: *Deleted

## 2023-03-12 ENCOUNTER — Inpatient Hospital Stay: Payer: BC Managed Care – PPO

## 2023-03-12 ENCOUNTER — Ambulatory Visit: Payer: BC Managed Care – PPO | Admitting: Internal Medicine

## 2023-03-12 ENCOUNTER — Inpatient Hospital Stay (HOSPITAL_BASED_OUTPATIENT_CLINIC_OR_DEPARTMENT_OTHER): Payer: BC Managed Care – PPO | Admitting: Internal Medicine

## 2023-03-12 VITALS — BP 113/67 | HR 97 | Temp 98.0°F | Ht 69.0 in | Wt 151.0 lb

## 2023-03-12 DIAGNOSIS — C259 Malignant neoplasm of pancreas, unspecified: Secondary | ICD-10-CM

## 2023-03-12 DIAGNOSIS — C787 Secondary malignant neoplasm of liver and intrahepatic bile duct: Secondary | ICD-10-CM

## 2023-03-12 DIAGNOSIS — Z515 Encounter for palliative care: Secondary | ICD-10-CM | POA: Diagnosis not present

## 2023-03-12 DIAGNOSIS — G893 Neoplasm related pain (acute) (chronic): Secondary | ICD-10-CM

## 2023-03-12 DIAGNOSIS — Z5111 Encounter for antineoplastic chemotherapy: Secondary | ICD-10-CM | POA: Diagnosis not present

## 2023-03-12 LAB — CMP (CANCER CENTER ONLY)
ALT: 77 U/L — ABNORMAL HIGH (ref 0–44)
AST: 84 U/L — ABNORMAL HIGH (ref 15–41)
Albumin: 2.3 g/dL — ABNORMAL LOW (ref 3.5–5.0)
Alkaline Phosphatase: 1195 U/L — ABNORMAL HIGH (ref 38–126)
Anion gap: 9 (ref 5–15)
BUN: 20 mg/dL (ref 8–23)
CO2: 26 mmol/L (ref 22–32)
Calcium: 10.3 mg/dL (ref 8.9–10.3)
Chloride: 101 mmol/L (ref 98–111)
Creatinine: 0.73 mg/dL (ref 0.61–1.24)
GFR, Estimated: >60 mL/min (ref >60)
Glucose, Bld: 224 mg/dL — ABNORMAL HIGH (ref 70–99)
Potassium: 4.6 mmol/L (ref 3.5–5.1)
Sodium: 136 mmol/L (ref 135–145)
Total Bilirubin: 0.6 mg/dL (ref <1.2)
Total Protein: 7.4 g/dL (ref 6.5–8.1)

## 2023-03-12 LAB — CBC WITH DIFFERENTIAL (CANCER CENTER ONLY)
Abs Immature Granulocytes: 0.31 10*3/uL — ABNORMAL HIGH (ref 0.00–0.07)
Basophils Absolute: 0.2 10*3/uL — ABNORMAL HIGH (ref 0.0–0.1)
Basophils Relative: 1 %
Eosinophils Absolute: 0.8 10*3/uL — ABNORMAL HIGH (ref 0.0–0.5)
Eosinophils Relative: 3 %
HCT: 29.8 % — ABNORMAL LOW (ref 39.0–52.0)
Hemoglobin: 8.9 g/dL — ABNORMAL LOW (ref 13.0–17.0)
Immature Granulocytes: 1 %
Lymphocytes Relative: 1 %
Lymphs Abs: 0.4 10*3/uL — ABNORMAL LOW (ref 0.7–4.0)
MCH: 26.7 pg (ref 26.0–34.0)
MCHC: 29.9 g/dL — ABNORMAL LOW (ref 30.0–36.0)
MCV: 89.5 fL (ref 80.0–100.0)
Monocytes Absolute: 1.8 10*3/uL — ABNORMAL HIGH (ref 0.1–1.0)
Monocytes Relative: 7 %
Neutro Abs: 21.6 10*3/uL — ABNORMAL HIGH (ref 1.7–7.7)
Neutrophils Relative %: 87 %
Platelet Count: 592 10*3/uL — ABNORMAL HIGH (ref 150–400)
RBC: 3.33 MIL/uL — ABNORMAL LOW (ref 4.22–5.81)
RDW: 14.6 % (ref 11.5–15.5)
WBC Count: 25 10*3/uL — ABNORMAL HIGH (ref 4.0–10.5)
nRBC: 0 % (ref 0.0–0.2)

## 2023-03-12 MED ORDER — PACLITAXEL PROTEIN-BOUND CHEMO INJECTION 100 MG
100.0000 mg/m2 | Freq: Once | INTRAVENOUS | Status: AC
Start: 2023-03-12 — End: 2023-03-12
  Administered 2023-03-12: 200 mg via INTRAVENOUS
  Filled 2023-03-12: qty 40

## 2023-03-12 MED ORDER — FENTANYL 25 MCG/HR TD PT72: 1.0000 | MEDICATED_PATCH | TRANSDERMAL | 0 refills | Status: DC

## 2023-03-12 MED ORDER — SODIUM CHLORIDE 0.9 % IV SOLN
1000.0000 mg/m2 | Freq: Once | INTRAVENOUS | Status: AC
  Administered 2023-03-12: 1824 mg via INTRAVENOUS
  Filled 2023-03-12: qty 47.97

## 2023-03-12 MED ORDER — PROCHLORPERAZINE MALEATE 10 MG PO TABS: 10.0000 mg | ORAL_TABLET | Freq: Four times a day (QID) | ORAL | 1 refills | Status: DC | PRN

## 2023-03-12 MED ORDER — PROCHLORPERAZINE MALEATE 10 MG PO TABS
10.0000 mg | ORAL_TABLET | Freq: Once | ORAL | Status: AC
  Administered 2023-03-12: 10 mg via ORAL
  Filled 2023-03-12: qty 1

## 2023-03-12 MED ORDER — LIDOCAINE-PRILOCAINE 2.5-2.5 % EX CREA: TOPICAL_CREAM | CUTANEOUS | 3 refills | Status: DC

## 2023-03-12 MED ORDER — SODIUM CHLORIDE 0.9 % IV SOLN
INTRAVENOUS | Status: DC
Start: 2023-03-12 — End: 2023-03-12
  Filled 2023-03-12: qty 250

## 2023-03-12 MED ORDER — ONDANSETRON HCL 8 MG PO TABS: ORAL_TABLET | ORAL | 1 refills | Status: DC

## 2023-03-12 NOTE — Assessment & Plan Note (Addendum)
#   NOV Metastatic pancreatic head mass-with multiple-liver masses; large pericardial effusion [Dr.End]- highly suspicious for malignancy-status post liver biopsy- Pathology-pancreatico biliary origin.  CA 19-9 normal however CEA elevated.  # Proceed with cycle #1 of gemcitabine Abraxane. Labs-CBC/chemistries were reviewed with the patient [see below regarding leukocytosis/liver function abnormalities].   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  Status post recent antibiotics.  Monitor closely.  # Elevated LFTs-likely secondary malignancy-no obvious signs of any biliary duct obstruction or cholangitis.  Monitor closely.   # cardio-respiratory: Acute PE/bilateral DVT-currently on Eliquis-stable.  Large pericardial effusion [Dr.End]-no cardiac tamponade; no pericardiocentesis monitor for now.  # right shoulder pain- on dilaudid 3 tijmes a day; awaiting  palliative care evaluation-   # Loss of appetite-secondary to malignancy; recommend Zyprexa. nutrition evaluation:  Discussed with palliative care.  # port placement. Plan start chemotherapy Sent the scripts for antiemetics-Zofran and Compazine; EMLA cream sent to pharmacy  # IV access discussed re: port placement; referral made.   # Also reviewed with the patient and family regarding the significantly abnormal liver function and also leukocytosis high risk of infection/and decompensation of chemotherapy.  Patient and family in agreement to proceed chemotherapy.  Awaiting palliative care discussion again in the clinic regarding CODE STATUS.  Ctcle #1- gem-ab q 2w; 2nd cyle- qwx3; q4  # DISPOSITION: # refer to IR re: port placement # refer to Nutrition- re: cancer/concern for malnutrition  # gem-abraxane today- labs are ok # follow up in next Thursday- APP- labs- cbc/cmp; possible IVFs over 1 hour.  # follow up in 2 weeks- MD; labs-cbc/cmp; CEA- gem-abraxane # follow up in 4 weeks- MD; labs-cbc/cmp; CEA-  gem-abraxane- Dr.B

## 2023-03-12 NOTE — Telephone Encounter (Signed)
The patient's daughter called to report that the patient received their first chemotherapy treatment today and developed a fever of 100.67F, along with a heart rate of 136 and difficulty being aroused. The family contacted EMS, and the patient was assessed. The patient's mental status improved, and they declined to go to the ER. The heart rate has decreased to 107, but the patient still has a fever of 100.72F Encourage oral hydration, and monitor patient symptoms. Recommend ER evaluation if his condition gets worse. Daughter voices understanding.

## 2023-03-12 NOTE — Telephone Encounter (Addendum)
Wife left vm to inquire about the PA for the fentanyl and emla cream.  I called pharmacy and let the pharmacy to let them know that it was approved per covermymeds. Out of pocket expense for the fentanyl will be $16. I called wife and updated her on the pa approval for meds. She was grateful for the return phone call.

## 2023-03-12 NOTE — Telephone Encounter (Signed)
Prior auth started thru covermymeds (Key: [id number]) Rx #: [id number] Lidocaine-Prilocaine 2.5-2.5% cream

## 2023-03-12 NOTE — Telephone Encounter (Signed)
This request has been approved.

## 2023-03-12 NOTE — Progress Notes (Signed)
[facility] CONSULT NOTE  Patient Care Team: Sherrie Mustache, MD as PCP - General (Internal Medicine) Luretha Murphy, MD as Consulting Physician (General Surgery) Earna Coder, MD as Consulting Physician (Oncology) Benita Gutter, RN as Oncology Nurse Navigator  CHIEF COMPLAINTS/PURPOSE OF CONSULTATION:  pancreatic cancer  Oncology History Overview Note   # NOV 2024- Liver, needle/core biopsy,  :      - METASTATIC CARCINOMA      - SEE NOTE       Diagnosis Note : The patient's clinical history of prostate cancer, recent      pleural effusion and MRI findings significant for a large head of pancreas mass      and multiple liver masses are noted.  Immunohistochemistry is performed on block      1B.  The tumor cells are diffusely positive for CK7 while negative for CK20,      CDX-2, TTF-1, and NKX3.1 (prostate marker).  The immunohistochemical staining      pattern is not specific and may be seen in cancers of the upper GI tract,      pancreaticobiliary system, lung (less likely due to TTF-1 negativity) and      breast.  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  Clinical and radiological correlation recommended.  Dr. [place]      reviewed the case and agrees with the above diagnosis.   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)  03/05/2023 Initial Diagnosis   Pancreatic cancer metastasized to liver (HCC)   03/11/2023 -  Chemotherapy   Patient is on Treatment Plan : PANCREATIC Abraxane D1,8,15 + Gemcitabine D1,8,15 q28d     03/12/2023 Cancer Staging   Staging form: Exocrine Pancreas, AJCC 8th Edition - Clinical: Stage IV (cT4, cN2, pM1) - Signed by Earna Coder, MD on 03/12/2023    Oncology History Overview Note   # NOV 2024- Liver, needle/core biopsy,  :      -  METASTATIC CARCINOMA      - SEE NOTE       Diagnosis Note : The patient's clinical history of prostate cancer, recent      pleural effusion and MRI findings significant for a large head of pancreas mass      and multiple liver masses are noted.  Immunohistochemistry is performed on block      1B.  The tumor cells are diffusely positive for CK7 while negative for CK20,      CDX-2, TTF-1, and NKX3.1 (prostate marker).  The immunohistochemical staining      pattern is not specific and may be seen in cancers of the upper GI tract,      pancreaticobiliary system, lung (less likely due to TTF-1 negativity) and      breast.  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  Clinical and radiological correlation recommended.  Dr. [place]      reviewed the case and agrees with the above diagnosis.   # NOV 26th, 2024- cycle #1 of gemcitabine Abraxane   Pancreatic cancer metastasized to liver [facility])  03/05/2023 Initial Diagnosis   Pancreatic cancer metastasized to liver [facility])   03/11/2023 -  Chemotherapy   Patient is on  Treatment Plan : PANCREATIC Abraxane D1,8,15 + Gemcitabine D1,8,15 q28d     03/12/2023 Cancer Staging   Staging form: Exocrine Pancreas, AJCC 8th Edition - Clinical: Stage IV (cT4, cN2, pM1) - Signed by Earna Coder, MD on 03/12/2023      HISTORY OF PRESENTING ILLNESS: Patient ambulating-independently. Accompanied by his wife and daughter.   [name] 62 y.o.  male pleasant patient with newly diagnosed pancreatic cancer metastatic to liver; and history of poorly controlled diabetes; acute PE/DVT on anticoagulation is here to proceed with chemotherapy.  Patient was recently admitted to hospital for acute PE/DVT.  And also underwent liver biopsy-multiple liver lesions/pancreatic lesion.  Patient was also treated for possible pneumonia.  Patient currently  at home.  Denies any nausea vomiting.  Appetite is poor.  Continues to have leg swelling.  Otherwise no fever no chills.  No mental status changes.   Review of Systems  Constitutional:  Positive for malaise/fatigue and weight loss. Negative for chills, diaphoresis and fever.  HENT:  Negative for nosebleeds and sore throat.   Eyes:  Negative for double vision.  Respiratory:  Positive for cough and shortness of breath. Negative for hemoptysis, sputum production and wheezing.   Cardiovascular:  Negative for chest pain, palpitations, orthopnea and leg swelling.  Gastrointestinal:  Negative for abdominal pain, blood in stool, constipation, diarrhea, heartburn, melena, nausea and vomiting.  Genitourinary:  Negative for dysuria, frequency and urgency.  Musculoskeletal:  Positive for back pain and joint pain.  Skin: Negative.  Negative for itching and rash.  Neurological:  Negative for dizziness, tingling, focal weakness, weakness and headaches.  Endo/Heme/Allergies:  Does not bruise/bleed easily.  Psychiatric/Behavioral:  Negative for depression. The patient is not nervous/anxious and does not have insomnia.     MEDICAL HISTORY:  Past Medical History:  Diagnosis Date   Asthma    Cancer (HCC)    prostate   Diabetes mellitus without complication (HCC)    type II   Hypertension    Sleep apnea    uses Cpap machine     SURGICAL HISTORY: Past Surgical History:  Procedure Laterality Date   CHOLECYSTECTOMY     COLONOSCOPY WITH PROPOFOL N/A 11/18/2018   Procedure: COLONOSCOPY WITH PROPOFOL;  Surgeon: Pasty Spillers, MD;  Location: [facility] ENDOSCOPY;  Service: Gastroenterology;  Laterality: N/A;   COLONOSCOPY WITH PROPOFOL N/A 02/05/2023   Procedure: COLONOSCOPY WITH PROPOFOL;  Surgeon: Toney Reil, MD;  Location: [facility] ENDOSCOPY;  Service: Gastroenterology;  Laterality: N/A;   IR US LIVER BIOPSY  03/03/2023   LAPAROSCOPIC RETROPUBIC PROSTATECTOMY     2023   LUNG SURGERY      "ligation of thoracic duct"   PROSTATE BIOPSY N/A 09/21/2020   Procedure: PROSTATE BIOPSY [name];  Surgeon: [name], MD;  Location: [facility] ORS;  Service: Urology;  Laterality: N/A;    SOCIAL HISTORY: Social History   Socioeconomic History   Marital status: Married    Spouse name: Not on file   Number of children: Not on file   Years of education: Not on file   Highest education level: Not on file  Occupational History   Occupation: Automotive engineer   Tobacco Use   Smoking status: Never   Smokeless tobacco: Never  Vaping Use   Vaping status: Never Used  Substance and Sexual Activity   Alcohol use: No   Drug use: No   Sexual activity: Not on file  Other Topics Concern   Not on file  Social History Narrative  Not on file   Social Determinants of Health   Financial Resource Strain: Not on file  Food Insecurity: No Food Insecurity (02/28/2023)   Hunger Vital Sign    Worried About Running Out of Food in the Last Year: Never true    Ran Out of Food in the Last Year: Never true  Transportation Needs: No Transportation Needs (02/28/2023)   PRAPARE - Administrator, Civil Service (Medical): No    Lack of Transportation (Non-Medical): No  Physical Activity: Not on file  Stress: Not on file  Social Connections: Not on file  Intimate Partner Violence: Not At Risk (02/28/2023)   Humiliation, Afraid, Rape, and Kick questionnaire    Fear of Current or Ex-Partner: No    Emotionally Abused: No    Physically Abused: No    Sexually Abused: No    FAMILY HISTORY: Family History  Problem Relation Age of Onset   Allergies Sister     ALLERGIES:  is allergic to other, morphine and codeine, tree extract, and wound dressing adhesive.  MEDICATIONS:  Current Outpatient Medications  Medication Sig Dispense Refill   ALPRAZolam (XANAX) 0.5 MG tablet Take 1 tablet (0.5 mg total) by mouth 2 (two) times daily as needed for anxiety. 10 tablet 0   amLODipine (NORVASC) 10 MG  tablet Take 10 mg by mouth daily.  1   apixaban (ELIQUIS) 5 MG TABS tablet Take 2 tablets (10 mg total) by mouth 2 (two) times daily for 5 days, THEN 1 tablet (5 mg total) 2 (two) times daily for 25 days. 70 tablet 0   atorvastatin (LIPITOR) 10 MG tablet Take 10 mg by mouth daily.     feeding supplement (ENSURE ENLIVE / ENSURE PLUS) LIQD Take 237 mLs by mouth 2 (two) times daily between meals. 14220 mL 0   HYDROmorphone (DILAUDID) 4 MG tablet Take 1 tablet (4 mg total) by mouth every 4 (four) hours as needed for up to 5 days for severe pain (pain score 7-10). 30 tablet 0   insulin aspart (NOVOLOG) 100 UNIT/ML FlexPen Inject 5 Units into the skin 3 (three) times daily with meals. If eating and Blood Glucose (BG) 80 or higher inject 5 units for meal coverage and add correction dose per scale. If not eating, correction dose only. BG <150= 0 unit; BG 150-200= 1 unit; BG 201-250= 2 unit; BG 251-300= 3 unit; BG 301-350= 4 unit; BG 351-400= 5 unit; BG >400= 6 unit and Call Primary care. 15 mL 0   JARDIANCE 25 MG TABS tablet Take 25 mg by mouth daily.     lidocaine-prilocaine (EMLA) cream Apply on the port. 30 -45 min  prior to port access. 30 g 3   losartan (COZAAR) 100 MG tablet Take 100 mg by mouth daily.  1   metoprolol tartrate (LOPRESSOR) 50 MG tablet Take 1 tablet (50 mg total) by mouth 2 (two) times daily. 60 tablet 0   mometasone-formoterol (DULERA) 100-5 MCG/ACT AERO Inhale 2 puffs into the lungs 2 (two) times daily as needed for wheezing or shortness of breath.     naloxone (NARCAN) nasal spray 4 mg/0.1 mL One spray in nostril for unresponsiveness 2 each 0   Omega-3 Fatty Acids (FISH OIL) 1000 MG CAPS Take 1,000 mg by mouth daily.     ondansetron (ZOFRAN) 8 MG tablet One pill every 8 hours as needed for nausea/vomitting. 40 tablet 1   prochlorperazine (COMPAZINE) 10 MG tablet Take 1 tablet (10 mg total) by mouth every  6 (six) hours as needed for nausea or vomiting. 40 tablet 1   No current  facility-administered medications for this visit.    PHYSICAL EXAMINATION:   Vitals:   03/12/23 0840  BP: 113/67  Pulse: 97  Temp: 98 F (36.7 C)  SpO2: 98%    Filed Weights   03/12/23 0840  Weight: 151 lb (68.5 kg)     Physical Exam Vitals and nursing note reviewed.  HENT:     Head: Normocephalic and atraumatic.     Mouth/Throat:     Pharynx: Oropharynx is clear.  Eyes:     Extraocular Movements: Extraocular movements intact.     Pupils: Pupils are equal, round, and reactive to light.  Cardiovascular:     Rate and Rhythm: Normal rate and regular rhythm.  Pulmonary:     Comments: Decreased breath sounds bilaterally.  Abdominal:     Palpations: Abdomen is soft.  Musculoskeletal:        General: Normal range of motion.     Cervical back: Normal range of motion.  Skin:    General: Skin is warm.  Neurological:     General: No focal deficit present.     Mental Status: He is alert and oriented to person, place, and time.  Psychiatric:        Behavior: Behavior normal.        Judgment: Judgment normal.     LABORATORY DATA:  I have reviewed the data as listed Lab Results  Component Value Date   WBC 25.0 (H) 03/12/2023   HGB 8.9 (L) 03/12/2023   HCT 29.8 (L) 03/12/2023   MCV 89.5 03/12/2023   PLT 592 (H) 03/12/2023   Recent Labs    03/05/23 0456 03/06/23 0619 03/07/23 0248 03/12/23 0846  NA 135 137  --  136  K 4.1 4.4  --  4.6  CL 108 102  --  101  CO2 23 27  --  26  GLUCOSE 173* 145*  --  224*  BUN 17 16  --  20  CREATININE 0.63 0.67  --  0.73  CALCIUM 8.4* 9.2  --  10.3  GFRNONAA >60 >60  --  >60  PROT  --  6.4* 6.5 7.4  ALBUMIN  --  2.0* 2.0* 2.3*  AST  --  70* 31 84*  ALT  --  48* 37 77*  ALKPHOS  --  534* 463* 1,195*  BILITOT  --  0.7 0.5 0.6  BILIDIR  --   --  0.1  --   IBILI  --   --  0.4  --     RADIOGRAPHIC STUDIES: I have personally reviewed the radiological images as listed and agreed with the findings in the report. DG Chest  Port 1 View  Result Date: 03/05/2023 CLINICAL DATA:  Shortness of breath.  Follow-up exam. EXAM: PORTABLE CHEST 1 VIEW COMPARISON:  Chest radiograph dated 02/27/2023. FINDINGS: Small right pleural effusion. Right lung base opacity as well as faint clusters of ground-glass density involving the right mid to lower lung field consistent with pneumonia. Overall interval worsening of pulmonary infiltrate since the radiograph of 02/27/2023. The left lung is clear. No pneumothorax. Stable cardiac silhouette. No acute osseous pathology. IMPRESSION: Interval worsening of right lung pneumonia. Electronically Signed   By: Elgie Collard M.D.   On: 03/05/2023 23:52   ECHOCARDIOGRAM LIMITED  Result Date: 03/04/2023    ECHOCARDIOGRAM LIMITED REPORT   Patient Name:   AVRUMI CORELL Date of Exam: 03/04/2023  Medical Rec #:  [id number]     Height:       69.0 in Accession #:    [id number]    Weight:       148.8 lb Date of Birth:  Mar 17, 1961    BSA:          1.822 m Patient Age:    61 years      BP:           117/60 mmHg Patient Gender: M             HR:           112 bpm. Exam Location:  [facility] Procedure: Limited Echo, Color Doppler and Cardiac Doppler Indications:     Pericardial Effusion I31.3  History:         Patient has prior history of Echocardiogram examinations, most                  recent 03/01/2023. Risk Factors:Diabetes, Hypertension and                  Sleep Apnea.  Sonographer:     Cristela Blue Referring Phys:  [id number] Marrion Coy Diagnosing Phys: Yvonne Kendall MD IMPRESSIONS  1. Left ventricular ejection fraction, by estimation, is >55%. The left ventricle has normal function. The left ventricle has no regional wall motion abnormalities.  2. Right ventricular systolic function is normal. The right ventricular size is normal.  3. Moderate pericardial effusion. The pericardial effusion is circumferential. There is no evidence of cardiac tamponade.  4. The mitral valve is normal in structure. Comparison(s): A prior  study was performed on 03/01/2023. Pericardial effusion appears slightly smaller. FINDINGS  Left Ventricle: Left ventricular ejection fraction, by estimation, is >55%. The left ventricle has normal function. The left ventricle has no regional wall motion abnormalities. The left ventricular internal cavity size was normal in size. There is borderline left ventricular hypertrophy. Right Ventricle: The right ventricular size is normal. No increase in right ventricular wall thickness. Right ventricular systolic function is normal. Pericardium: A moderately sized pericardial effusion is present. The pericardial effusion is circumferential. There is no evidence of cardiac tamponade. Mitral Valve: The mitral valve is normal in structure. Tricuspid Valve: The tricuspid valve is grossly normal. Tricuspid valve regurgitation is mild. Aorta: The aortic root is normal in size and structure. Venous: The inferior vena cava was not well visualized. Additional Comments: Spectral Doppler performed. Color Doppler performed.  LEFT VENTRICLE PLAX 2D LVIDd:         4.30 cm LVIDs:         2.30 cm LV PW:         1.12 cm LV IVS:        0.95 cm   AORTA Ao Root diam: 3.30 cm Yvonne Kendall MD Electronically signed by Yvonne Kendall MD Signature Date/Time: 03/04/2023/11:43:49 AM    Final    IR US LIVER BIOPSY  Result Date: 03/03/2023 INDICATION: 62 year old male with pancreatic mass and concern for hepatic metastatic disease. EXAM: LIVER CORE BIOPSY MEDICATIONS: None. ANESTHESIA/SEDATION: Moderate (conscious) sedation was employed during this procedure. A total of Versed 1 mg and Fentanyl 50 mcg was administered intravenously. Moderate Sedation Time: 10 minutes. The patient's level of consciousness and vital signs were monitored continuously by radiology nursing throughout the procedure under my direct supervision. FLUOROSCOPY TIME:  None. COMPLICATIONS: None immediate. PROCEDURE: Informed written consent was obtained from the patient  after a thorough discussion of the procedural risks, benefits and  alternatives. All questions were addressed. Maximal Sterile Barrier Technique was utilized including caps, mask, sterile gowns, sterile gloves, sterile drape, hand hygiene and skin antiseptic. A timeout was performed prior to the initiation of the procedure. The right upper quadrant was interrogated with ultrasound. Multiple echogenic masses present throughout the liver. A suitable skin entry site was selected and marked. The region was sterilely prepped and draped in the standard fashion using chlorhexidine skin prep. Local anesthesia was attained by infiltration with 1% lidocaine. A small dermatotomy was made. Under real-time sonographic guidance, a 17 gauge introducer needle was advanced into the margin of the mass. Multiple 18 gauge biopsies were then coaxially obtained using the BioPince automated biopsy device. Biopsy samples were placed in formalin and delivered to pathology for further analysis. As a 17 gauge introducer needle was removed, the biopsy tract was embolized with a Gel-Foam slurry. Post biopsy ultrasound imaging demonstrates no evidence of active hemorrhage or perihepatic hematoma. The patient tolerated the procedure well. IMPRESSION: Technically successful ultrasound-guided core biopsy of hepatic lesion. Electronically Signed   By: Malachy Moan M.D.   On: 03/03/2023 14:59   ECHOCARDIOGRAM COMPLETE  Result Date: 03/01/2023    ECHOCARDIOGRAM REPORT   Patient Name:   JORON CARLOUGH Date of Exam: 03/01/2023 Medical Rec #:  [id number]     Height:       69.0 in Accession #:    [id number]    Weight:       149.9 lb Date of Birth:  [birth year]    BSA:          1.828 m Patient Age:    61 years      BP:           138/75 mmHg Patient Gender: M             HR:           116 bpm. Exam Location:  [facility] Procedure: 2D Echo Indications:     Pulmonary Embolus I26.09  History:         Patient has no prior history of Echocardiogram  examinations.  Sonographer:     Overton Mam RDCS, FASE Referring Phys:  [id number] Vernetta Honey MANSY Diagnosing Phys: Julien Nordmann MD IMPRESSIONS  1. Left ventricular ejection fraction, by estimation, is 60 to 65%. The left ventricle has normal function. The left ventricle has no regional wall motion abnormalities. Left ventricular diastolic parameters are indeterminate.  2. Right ventricular systolic function is normal. The right ventricular size is normal. There is moderately elevated pulmonary artery systolic pressure. The estimated right ventricular systolic pressure is 54.0 mmHg.  3. Large pericardial effusion. The pericardial effusion is circumferential. There is no evidence of cardiac tamponade. 1.95 off the RV free wall, 2.16 cm off the apical region, 1.70 cm off the LV free wall. IVC is not dilated at 1.16 cm  4. The mitral valve is normal in structure. No evidence of mitral valve regurgitation. No evidence of mitral stenosis.  5. The aortic valve is tricuspid. Aortic valve regurgitation is not visualized. No aortic stenosis is present.  6. The inferior vena cava is normal in size with greater than 50% respiratory variability, suggesting right atrial pressure of 3 mmHg. FINDINGS  Left Ventricle: Left ventricular ejection fraction, by estimation, is 60 to 65%. The left ventricle has normal function. The left ventricle has no regional wall motion abnormalities. The left ventricular internal cavity size was normal in size. There is  no left ventricular hypertrophy. Left ventricular  diastolic parameters are indeterminate. Right Ventricle: The right ventricular size is normal. No increase in right ventricular wall thickness. Right ventricular systolic function is normal. There is moderately elevated pulmonary artery systolic pressure. The tricuspid regurgitant velocity is 3.50 m/s, and with an assumed right atrial pressure of 5 mmHg, the estimated right ventricular systolic pressure is 54.0 mmHg. Left Atrium: Left  atrial size was normal in size. Right Atrium: Right atrial size was normal in size. Pericardium: A large pericardial effusion is present. The pericardial effusion is circumferential. There is no evidence of cardiac tamponade. Mitral Valve: The mitral valve is normal in structure. No evidence of mitral valve regurgitation. No evidence of mitral valve stenosis. Tricuspid Valve: The tricuspid valve is normal in structure. Tricuspid valve regurgitation is mild . No evidence of tricuspid stenosis. Aortic Valve: The aortic valve is tricuspid. Aortic valve regurgitation is not visualized. No aortic stenosis is present. Aortic valve peak gradient measures 12.0 mmHg. Pulmonic Valve: The pulmonic valve was normal in structure. Pulmonic valve regurgitation is not visualized. No evidence of pulmonic stenosis. Aorta: The aortic root is normal in size and structure. Venous: The inferior vena cava is normal in size with greater than 50% respiratory variability, suggesting right atrial pressure of 3 mmHg. IAS/Shunts: No atrial level shunt detected by color flow Doppler.  LEFT VENTRICLE PLAX 2D LVIDd:         4.60 cm   Diastology LVIDs:         2.90 cm   LV e' medial:    11.70 cm/s LV PW:         1.00 cm   LV E/e' medial:  8.8 LV IVS:        1.00 cm   LV e' lateral:   18.60 cm/s LVOT diam:     2.10 cm   LV E/e' lateral: 5.5 LV SV:         67 LV SV Index:   37 LVOT Area:     3.46 cm  RIGHT VENTRICLE RV Basal diam:  2.50 cm RV S prime:     22.30 cm/s TAPSE (M-mode): 1.8 cm LEFT ATRIUM             Index        RIGHT ATRIUM          Index LA diam:        2.70 cm 1.48 cm/m   RA Area:     7.05 cm LA Vol (A2C):   45.8 ml 25.06 ml/m  RA Volume:   9.68 ml  5.30 ml/m LA Vol (A4C):   25.5 ml 13.95 ml/m LA Biplane Vol: 35.4 ml 19.37 ml/m  AORTIC VALVE                 PULMONIC VALVE AV Area (Vmax): 2.46 cm     PV Vmax:        1.10 m/s AV Vmax:        173.00 cm/s  PV Peak grad:   4.8 mmHg AV Peak Grad:   12.0 mmHg    RVOT Peak grad: 4  mmHg LVOT Vmax:      123.00 cm/s LVOT Vmean:     79.500 cm/s LVOT VTI:       0.193 m  AORTA                        PULMONARY ARTERY Ao Root diam: 3.50 cm        MPA  diam:        3.00 cm Ao Asc diam:  2.70 cm MITRAL VALVE                TRICUSPID VALVE MV Area (PHT): 7.22 cm     TR Peak grad:   49.0 mmHg MV Decel Time: 105 msec     TR Vmax:        350.00 cm/s MV E velocity: 103.00 cm/s MV A velocity: 87.50 cm/s   SHUNTS MV E/A ratio:  1.18         Systemic VTI:  0.19 m                             Systemic Diam: 2.10 cm Julien Nordmann MD Electronically signed by Julien Nordmann MD Signature Date/Time: 03/01/2023/3:02:54 PM    Final    MR LIVER W WO CONTRAST  Result Date: 03/01/2023 CLINICAL DATA:  62 year old male with history of shortness of breath recently diagnosed with blood clots in the legs. Hepatic masses identified on recent chest CTA. Further evaluation to exclude the possibility of malignancy. EXAM: MRI ABDOMEN WITHOUT AND WITH CONTRAST TECHNIQUE: Multiplanar multisequence MR imaging of the abdomen was performed both before and after the administration of intravenous contrast. CONTRAST:  7mL GADAVIST GADOBUTROL 1 MMOL/ML IV SOLN COMPARISON:  No prior abdominal MRI.  Chest CTA 02/27/2023. FINDINGS: Comment: Portions of today's examination are limited by considerable patient respiratory motion. Lower chest: Small right pleural effusion with areas of atelectasis and/or scarring in the right lung base. Hepatobiliary: There are multiple hepatic lesions highly concerning for widespread metastatic disease to the liver. The largest of these is a dominant lesion in the central aspect of the liver, predominantly centered in segment 8 (axial image 15 of series 4 and coronal image 22 series 3) measuring up to 13.7 x 9.6 x 12.1 cm. These lesions are heterogeneous in signal intensity but generally T1 hypointense, slightly T2 hyperintense, hypovascular with predominantly peripheral enhancement which increases slightly  on delayed images, and extensive internal diffusion restriction. No definite hypervascular hepatic lesion is identified. Gallbladder is not visualized, likely surgically absent. No intra or extrahepatic biliary ductal dilatation. Pancreas: In the region of the pancreatic head (best appreciated on axial image 55 of series 14 and coronal image 45 of series 20) there is what appears to be a poorly defined hypovascular mass estimated to measure approximately 4.0 x 2.6 x 2.9 cm, which is located immediately anterior and lateral to the superior mesenteric vein adjacent to the splenic pulmonary confluence, and inferior to the proximal portal vein, with minimal obscuration of the intervening fat plane adjacent to the superior mesenteric vein. This lesion appears to obstruct the main pancreatic duct (axial image 49 of series 14), proximal to which the main pancreatic duct is severely dilated measuring up to 12 mm in the body of the pancreas. Diffuse atrophy throughout the body and tail of the pancreas is noted. 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 Spleen:  Unremarkable. Adrenals/Urinary Tract: Multiple T1 hypointense, T2 hyperintense, nonenhancing lesions in the kidneys are compatible with simple cysts (Bosniak class 1), which require no imaging follow-up, largest of which measure up to approximately 2.2 cm in diameter in the lower pole of  the left kidney. No aggressive appearing renal lesions. No hydroureteronephrosis in the visualized portions of the abdomen. Bilateral adrenal glands are normal in appearance. Stomach/Bowel: Visualized portions are unremarkable. Vascular/Lymphatic: No aneurysm identified in the visualized abdominal vasculature. No  definite lymphadenopathy confidently identified in the abdomen. Other:  Trace volume of perihepatic ascites. Musculoskeletal: No aggressive appearing osseous lesions are noted in the visualized portions of the skeleton. IMPRESSION: 1. Large hypovascular mass in the region of the head of the pancreas causing occlusion of the main pancreatic duct, highly concerning for primary pancreatic neoplasm. Numerous hypovascular lesions scattered throughout the liver, presumably reflective of widespread metastatic disease to the liver, with the largest lesion a dominant mass centered in the right lobe of the liver measuring up to 13.7 x 9.6 x 12.1 cm. 2. Trace volume of perihepatic ascites. 3. Fluid collection anterior to the mass adjacent to the head of the pancreas, presumably a proteinaceous/hemorrhagic pancreatic pseudocyst. 4. Small right pleural effusion. 5. Additional incidental findings, as above. Electronically Signed   By: Trudie Reed M.D.   On: 03/01/2023 05:35   CT Angio Chest PE W and/or Wo Contrast  Result Date: 02/27/2023 CLINICAL DATA:  Shortness of breath blood clots in leg EXAM: CT ANGIOGRAPHY CHEST WITH CONTRAST TECHNIQUE: Multidetector CT imaging of the chest was performed using the standard protocol during bolus administration of intravenous contrast. Multiplanar CT image reconstructions and MIPs were obtained to evaluate the vascular anatomy. RADIATION DOSE REDUCTION: This exam was performed according to the departmental dose-optimization program which includes automated exposure control, adjustment of the mA and/or kV according to patient size and/or use of iterative reconstruction technique. CONTRAST:  75mL OMNIPAQUE IOHEXOL 350 MG/ML SOLN COMPARISON:  Chest x-ray 02/27/2023 FINDINGS: Cardiovascular: Satisfactory opacification of the pulmonary arteries to the segmental level. Small nonocclusive segmental/subsegmental embolus within the left upper lobe, series 6, image 168. No other discrete  emboli are visualized. Mild atherosclerosis. No aneurysm or dissection. Normal cardiac size. Moderate to large circumferential pericardial effusion measuring up to 2.1 cm, slight increased density values suggesting complex fluid. Mediastinum/Nodes: Midline trachea. No thyroid mass. Subcarinal nodes measuring at least up to 2 cm. Esophagus within normal limits. Small clips in the posterior mediastinum. Lungs/Pleura: Small circumferential right pleural effusion. Calcifications or sutures at the posterior right lung base. Prominent peribronchovascular thickening within the right perihilar region and within the right middle and lower lobe bronchi. Multifocal bilateral ground-glass densities and areas of tree-in-bud density suspicious for respiratory infection Upper Abdomen: Large hypodense right hepatic mass suspected, this measures at least 12.6 cm. Another hypodense inferior right hepatic lobe lesion measuring 2.8 cm on series 4, image 153 incompletely assessed. Musculoskeletal: No acute osseous abnormality. Review of the MIP images confirms the above findings. IMPRESSION: 1. Positive for small nonocclusive segmental/subsegmental pulmonary embolus within the left upper lobe. 2. Moderate to large circumferential pericardial effusion measuring up to 2.1 cm, slight increased density values suggesting complex fluid. Recommend correlation with echocardiogram 3. Small circumferential right pleural effusion. Prominent peribronchovascular thickening within the right perihilar region and within the right upper, middle and lower lobe bronchi, with mild mucous plugging in the right lower lobe. Multifocal bilateral ground-glass densities and areas of tree-in-bud density suspicious for respiratory infection/pneumonia, possible atypical infection. 4. Subcarinal adenopathy, reactive versus metastatic. 5. Right hepatic masses measuring up to 12.6 cm. Findings are suspicious for metastatic disease versus liver carcinoma. When the  patient is clinically stable and able to follow directions and hold their breath (preferably as an outpatient) further  evaluation with dedicated abdominal MRI should be considered. Critical Value/emergent results were called by telephone at the time of interpretation on 02/27/2023 at 11:02 pm to provider [facility] , who verbally acknowledged these results. Electronically Signed   By: Jasmine Pang M.D.   On: 02/27/2023 23:04   DG Chest 2 View  Result Date: 02/27/2023 CLINICAL DATA:  Shortness of breath EXAM: CHEST - 2 VIEW COMPARISON:  08/04/2013 FINDINGS: Left lung is grossly clear. Normal cardiac size. Small loculated pleural effusion versus pleural thickening. Airspace disease at the right middle lobe and right base. No pneumothorax IMPRESSION: Airspace disease at the right middle lobe and right base, possible pneumonia. Small loculated pleural effusion versus pleural thickening. Electronically Signed   By: Jasmine Pang M.D.   On: 02/27/2023 22:45   US Venous Img Lower Bilateral (DVT)  Result Date: 02/26/2023 CLINICAL DATA:  Leg swelling for 2 weeks EXAM: BILATERAL LOWER EXTREMITY VENOUS DOPPLER ULTRASOUND TECHNIQUE: Gray-scale sonography with graded compression, as well as color Doppler and duplex ultrasound were performed to evaluate the lower extremity deep venous systems from the level of the common femoral vein and including the common femoral, femoral, profunda femoral, popliteal and calf veins including the posterior tibial, peroneal and gastrocnemius veins when visible. The superficial great saphenous vein was also interrogated. Spectral Doppler was utilized to evaluate flow at rest and with distal augmentation maneuvers in the common femoral, femoral and popliteal veins. COMPARISON:  None Available. FINDINGS: RIGHT LOWER EXTREMITY Common Femoral Vein: No evidence of thrombus. Normal compressibility, respiratory phasicity and response to augmentation. Saphenofemoral Junction: No  evidence of thrombus. Normal compressibility and flow on color Doppler imaging. Profunda Femoral Vein: No evidence of thrombus. Normal compressibility and flow on color Doppler imaging. Femoral Vein: Poor flow on Doppler. Poor compression with hypoechoic material. Popliteal Vein: Poor flow on Doppler with poor compression and luminal hypoechoic appearance Calf Veins: Areas of thrombus along the posterior tibial and peroneal vein. Superficial Great Saphenous Vein: No evidence of thrombus. Normal compressibility. Venous Reflux:  None. Other Findings:  None. LEFT LOWER EXTREMITY Common Femoral Vein: No evidence of thrombus. Normal compressibility, respiratory phasicity and response to augmentation. Saphenofemoral Junction: No evidence of thrombus. Normal compressibility and flow on color Doppler imaging. Profunda Femoral Vein: No evidence of thrombus. Normal compressibility and flow on color Doppler imaging. Femoral Vein: No evidence of thrombus. Normal compressibility, respiratory phasicity and response to augmentation. Popliteal Vein: Incomplete compression and poor flow on Doppler, color and spectral Calf Veins: Areas of thrombus in the peroneal and posterior tibial veins. Superficial Great Saphenous Vein: No evidence of thrombus. Normal compressibility. Venous Reflux:  None. Other Findings:  None. Critical Value/emergent results were called by telephone at the time of interpretation on 02/26/2023 at 6:04 pm to provider [facility] , who verbally acknowledged these results. IMPRESSION: Bilateral DVT. Right femoral vein down through the calf veins. Left popliteal and calf veins. Electronically Signed   By: Karen Kays M.D.   On: 02/26/2023 18:14     Pancreatic cancer metastasized to liver [facility]) # NOV Metastatic pancreatic head mass-with multiple-liver masses; large pericardial effusion [Dr.End]- highly suspicious for malignancy-status post liver biopsy- Pathology-pancreatico biliary origin.  CA 19-9  normal however CEA elevated.  # Proceed with cycle #1 of gemcitabine Abraxane. Labs-CBC/chemistries were reviewed with the patient [see below regarding leukocytosis/liver function abnormalities].   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  Status post recent antibiotics.  Monitor closely.  # Elevated LFTs-likely secondary malignancy-no obvious signs  of any biliary duct obstruction or cholangitis.  Monitor closely.   # cardio-respiratory: Acute PE/bilateral DVT-currently on Eliquis-stable.  Large pericardial effusion [Dr.End]-no cardiac tamponade; no pericardiocentesis monitor for now.  # right shoulder pain- on dilaudid 3 tijmes a day; awaiting  palliative care evaluation-   # Loss of appetite-secondary to malignancy; recommend Zyprexa. nutrition evaluation:  Discussed with palliative care.  # port placement. Plan start chemotherapy Sent the scripts for antiemetics-Zofran and Compazine; EMLA cream sent to pharmacy  # IV access discussed re: port placement; referral made.   # Also reviewed with the patient and family regarding the significantly abnormal liver function and also leukocytosis high risk of infection/and decompensation of chemotherapy.  Patient and family in agreement to proceed chemotherapy.  Awaiting palliative care discussion again in the clinic regarding CODE STATUS.  Ctcle #1- gem-ab q 2w; 2nd cyle- qwx3; q4  # DISPOSITION: # refer to IR re: port placement # refer to Nutrition- re: cancer/concern for malnutrition  # gem-abraxane today- labs are ok # follow up in next Thursday- APP- labs- cbc/cmp; possible IVFs over 1 hour.  # follow up in 2 weeks- MD; labs-cbc/cmp; CEA- gem-abraxane # follow up in 4 weeks- MD; labs-cbc/cmp; CEA- gem-abraxane- Dr.B   Above plan of care was discussed with patient/family in detail.  My contact information was given to the patient/family.     Earna Coder, MD 03/12/2023 10:02 AM

## 2023-03-12 NOTE — Telephone Encounter (Signed)
Tony Benson (Key: [id number]) PA Case ID #: [id number] Rx #: [id number] Approved today by [facility] [employer] [facility] 2017 Approved.-Authorization Expiration Date: 06/10/2023 Drug-Lidocaine-Prilocaine 2.5-2.5% cream

## 2023-03-12 NOTE — Progress Notes (Addendum)
Palliative Medicine [facility] at [facility] [phone] [phone]   Name: Tony Benson Date: 03/12/2023 MRN: [id number]  DOB: 04/12/1961  Patient Care Team: Sherrie Mustache, MD as PCP - General (Internal Medicine) Luretha Murphy, MD as Consulting Physician (General Surgery) Earna Coder, MD as Consulting Physician (Oncology) Benita Gutter, RN as Oncology Nurse Navigator    REASON FOR CONSULTATION: Tony Benson is a 62 y.o. male with multiple medical problems including OSA on CPAP, diabetes.  Patient was hospitalized November 2024 with shortness of breath/acute PE, bilateral DVT.  CT of the abdomen and pelvis found to have a pancreatic head mass and liver masses.  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  Palliative care was consulted to address goals and manage ongoing symptoms.  SOCIAL HISTORY:     reports that he has never smoked. He has never used smokeless tobacco. He reports that he does not drink alcohol and does not use drugs.  Patient is married lives at home with his wife.  They have a daughter in [place] who is a Engineer, civil (consulting).  Patient worked in [employer] fund[employer].  ADVANCE DIRECTIVES:    CODE STATUS:   PAST MEDICAL HISTORY: Past Medical History:  Diagnosis Date   Asthma    Cancer (HCC)    prostate   Diabetes mellitus without complication (HCC)    type II   Hypertension    Sleep apnea    uses Cpap machine     PAST SURGICAL HISTORY:  Past Surgical History:  Procedure Laterality Date   CHOLECYSTECTOMY     COLONOSCOPY WITH PROPOFOL N/A 11/18/2018   Procedure: COLONOSCOPY WITH PROPOFOL;  Surgeon: Pasty Spillers, MD;  Location: [facility] ENDOSCOPY;  Service: Gastroenterology;  Laterality: N/A;   COLONOSCOPY WITH PROPOFOL N/A 02/05/2023   Procedure: COLONOSCOPY WITH PROPOFOL;  Surgeon: Toney Reil, MD;  Location: [facility]  ENDOSCOPY;  Service: Gastroenterology;  Laterality: N/A;   IR US LIVER BIOPSY  03/03/2023   LAPAROSCOPIC RETROPUBIC PROSTATECTOMY     2023   LUNG SURGERY     "ligation of thoracic duct"   PROSTATE BIOPSY N/A 09/21/2020   Procedure: PROSTATE BIOPSY [name];  Surgeon: [name], MD;  Location: [facility] ORS;  Service: Urology;  Laterality: N/A;    HEMATOLOGY/ONCOLOGY HISTORY:  Oncology History Overview Note   # NOV 2024- Liver, needle/core biopsy,  :      - METASTATIC CARCINOMA      - SEE NOTE       Diagnosis Note : The patient's clinical history of prostate cancer, recent      pleural effusion and MRI findings significant for a large head of pancreas mass      and multiple liver masses are noted.  Immunohistochemistry is performed on block      1B.  The tumor cells are diffusely positive for CK7 while negative for CK20,      CDX-2, TTF-1, and NKX3.1 (prostate marker).  The immunohistochemical staining      pattern is not specific and may be seen in cancers of the upper GI tract,      pancreaticobiliary system, lung (less likely due to TTF-1 negativity) and      breast.  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  Clinical and radiological correlation recommended.  Dr. [place]  reviewed the case and agrees with the above diagnosis.   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  MEDICATIONS:  Current Outpatient Medications  Medication Sig Dispense Refill   ALPRAZolam (XANAX) 0.5 MG tablet Take 1 tablet (0.5 mg total) by mouth 2 (two) times daily as needed for anxiety. 10 tablet 0   amLODipine (NORVASC) 10 MG tablet Take 10 mg by mouth daily.  1   apixaban (ELIQUIS) 5 MG TABS tablet Take 2 tablets (10 mg total) by mouth 2 (two) times daily for 5 days, THEN 1 tablet (5 mg total) 2 (two) times daily for 25 days. 70 tablet 0   atorvastatin (LIPITOR) 10 MG tablet Take 10 mg by mouth daily.     feeding supplement (ENSURE ENLIVE / ENSURE PLUS) LIQD Take 237 mLs by mouth 2 (two) times daily between meals. 14220 mL 0   HYDROmorphone (DILAUDID) 4 MG tablet Take 1 tablet (4 mg total) by mouth every 4 (four) hours as needed for up to 5 days for severe pain (pain score 7-10). 30 tablet 0   insulin aspart (NOVOLOG) 100 UNIT/ML FlexPen Inject 5 Units into the skin 3 (three) times daily with meals. If eating and Blood Glucose (BG) 80 or higher inject 5 units for meal coverage and add correction dose per scale. If not eating, correction dose only. BG <150= 0 unit; BG 150-200= 1 unit; BG 201-250= 2 unit; BG 251-300= 3 unit; BG 301-350= 4 unit; BG 351-400= 5 unit; BG >400= 6 unit and Call Primary care. 15 mL 0   JARDIANCE 25 MG TABS tablet Take 25 mg by mouth daily.     losartan (COZAAR) 100 MG tablet Take 100 mg by mouth daily.  1   metoprolol tartrate (LOPRESSOR) 50 MG tablet Take 1 tablet (50 mg total) by mouth 2 (two) times daily. 60 tablet 0   mometasone-formoterol (DULERA) 100-5 MCG/ACT AERO Inhale 2 puffs into the lungs 2 (two) times daily as needed for wheezing or shortness of breath.     naloxone (NARCAN) nasal spray 4 mg/0.1 mL One spray in nostril for unresponsiveness 2 each 0   Omega-3 Fatty Acids (FISH OIL) 1000 MG CAPS Take 1,000 mg by mouth daily.     No current facility-administered medications for this visit.    VITAL SIGNS: There were no vitals taken for  this visit. There were no vitals filed for this visit.  Estimated body mass index is 22.3 kg/m as calculated from the following:   Height as of an earlier encounter on 03/12/23: 5\' 9"  (1.753 m).   Weight as of an earlier encounter on 03/12/23: 151 lb (68.5 kg).  LABS: CBC:    Component Value Date/Time   WBC 25.0 (H) 03/12/2023 0846   WBC 21.7 (H) 03/07/2023 0248   HGB 8.9 (L) 03/12/2023 0846   HCT 29.8 (L) 03/12/2023 0846   PLT 592 (H) 03/12/2023 0846   MCV 89.5 03/12/2023 0846   NEUTROABS 21.6 (H) 03/12/2023 1610  LYMPHSABS 0.4 (L) 03/12/2023 0846   MONOABS 1.8 (H) 03/12/2023 0846   EOSABS 0.8 (H) 03/12/2023 0846   BASOSABS 0.2 (H) 03/12/2023 0846   Comprehensive Metabolic Panel:    Component Value Date/Time   NA 137 03/06/2023 0619   K 4.4 03/06/2023 0619   CL 102 03/06/2023 0619   CO2 27 03/06/2023 0619   BUN 16 03/06/2023 0619   CREATININE 0.67 03/06/2023 0619   GLUCOSE 145 (H) 03/06/2023 0619   CALCIUM 9.2 03/06/2023 0619   AST 31 03/07/2023 0248   ALT 37 03/07/2023 0248   ALKPHOS 463 (H) 03/07/2023 0248   BILITOT 0.5 03/07/2023 0248   PROT 6.5 03/07/2023 0248   ALBUMIN 2.0 (L) 03/07/2023 0248    RADIOGRAPHIC STUDIES: DG Chest Port 1 View  Result Date: 03/05/2023 CLINICAL DATA:  Shortness of breath.  Follow-up exam. EXAM: PORTABLE CHEST 1 VIEW COMPARISON:  Chest radiograph dated 02/27/2023. FINDINGS: Small right pleural effusion. Right lung base opacity as well as faint clusters of ground-glass density involving the right mid to lower lung field consistent with pneumonia. Overall interval worsening of pulmonary infiltrate since the radiograph of 02/27/2023. The left lung is clear. No pneumothorax. Stable cardiac silhouette. No acute osseous pathology. IMPRESSION: Interval worsening of right lung pneumonia. Electronically Signed   By: Elgie Collard M.D.   On: 03/05/2023 23:52   ECHOCARDIOGRAM LIMITED  Result Date: 03/04/2023    ECHOCARDIOGRAM LIMITED REPORT    Patient Name:   Tony Benson Date of Exam: 03/04/2023 Medical Rec #:  [id number]     Height:       69.0 in Accession #:    [id number]    Weight:       148.8 lb Date of Birth:  09/01/1960    BSA:          1.822 m Patient Age:    61 years      BP:           117/60 mmHg Patient Gender: M             HR:           112 bpm. Exam Location:  [facility] Procedure: Limited Echo, Color Doppler and Cardiac Doppler Indications:     Pericardial Effusion I31.3  History:         Patient has prior history of Echocardiogram examinations, most                  recent 03/01/2023. Risk Factors:Diabetes, Hypertension and                  Sleep Apnea.  Sonographer:     Cristela Blue Referring Phys:  [id number] Marrion Coy Diagnosing Phys: Yvonne Kendall MD IMPRESSIONS  1. Left ventricular ejection fraction, by estimation, is >55%. The left ventricle has normal function. The left ventricle has no regional wall motion abnormalities.  2. Right ventricular systolic function is normal. The right ventricular size is normal.  3. Moderate pericardial effusion. The pericardial effusion is circumferential. There is no evidence of cardiac tamponade.  4. The mitral valve is normal in structure. Comparison(s): A prior study was performed on 03/01/2023. Pericardial effusion appears slightly smaller. FINDINGS  Left Ventricle: Left ventricular ejection fraction, by estimation, is >55%. The left ventricle has normal function. The left ventricle has no regional wall motion abnormalities. The left ventricular internal cavity size was normal in size. There is borderline left ventricular hypertrophy. Right Ventricle: The right ventricular size is normal. No increase  in right ventricular wall thickness. Right ventricular systolic function is normal. Pericardium: A moderately sized pericardial effusion is present. The pericardial effusion is circumferential. There is no evidence of cardiac tamponade. Mitral Valve: The mitral valve is normal in structure. Tricuspid  Valve: The tricuspid valve is grossly normal. Tricuspid valve regurgitation is mild. Aorta: The aortic root is normal in size and structure. Venous: The inferior vena cava was not well visualized. Additional Comments: Spectral Doppler performed. Color Doppler performed.  LEFT VENTRICLE PLAX 2D LVIDd:         4.30 cm LVIDs:         2.30 cm LV PW:         1.12 cm LV IVS:        0.95 cm   AORTA Ao Root diam: 3.30 cm Yvonne Kendall MD Electronically signed by Yvonne Kendall MD Signature Date/Time: 03/04/2023/11:43:49 AM    Final    IR US LIVER BIOPSY  Result Date: 03/03/2023 INDICATION: 62 year old male with pancreatic mass and concern for hepatic metastatic disease. EXAM: LIVER CORE BIOPSY MEDICATIONS: None. ANESTHESIA/SEDATION: Moderate (conscious) sedation was employed during this procedure. A total of Versed 1 mg and Fentanyl 50 mcg was administered intravenously. Moderate Sedation Time: 10 minutes. The patient's level of consciousness and vital signs were monitored continuously by radiology nursing throughout the procedure under my direct supervision. FLUOROSCOPY TIME:  None. COMPLICATIONS: None immediate. PROCEDURE: Informed written consent was obtained from the patient after a thorough discussion of the procedural risks, benefits and alternatives. All questions were addressed. Maximal Sterile Barrier Technique was utilized including caps, mask, sterile gowns, sterile gloves, sterile drape, hand hygiene and skin antiseptic. A timeout was performed prior to the initiation of the procedure. The right upper quadrant was interrogated with ultrasound. Multiple echogenic masses present throughout the liver. A suitable skin entry site was selected and marked. The region was sterilely prepped and draped in the standard fashion using chlorhexidine skin prep. Local anesthesia was attained by infiltration with 1% lidocaine. A small dermatotomy was made. Under real-time sonographic guidance, a 17 gauge introducer  needle was advanced into the margin of the mass. Multiple 18 gauge biopsies were then coaxially obtained using the BioPince automated biopsy device. Biopsy samples were placed in formalin and delivered to pathology for further analysis. As a 17 gauge introducer needle was removed, the biopsy tract was embolized with a Gel-Foam slurry. Post biopsy ultrasound imaging demonstrates no evidence of active hemorrhage or perihepatic hematoma. The patient tolerated the procedure well. IMPRESSION: Technically successful ultrasound-guided core biopsy of hepatic lesion. Electronically Signed   By: Malachy Moan M.D.   On: 03/03/2023 14:59   ECHOCARDIOGRAM COMPLETE  Result Date: 03/01/2023    ECHOCARDIOGRAM REPORT   Patient Name:   Tony Benson Date of Exam: 03/01/2023 Medical Rec #:  [id number]     Height:       69.0 in Accession #:    [id number]    Weight:       149.9 lb Date of Birth:  22-Aug-1960    BSA:          1.828 m Patient Age:    61 years      BP:           138/75 mmHg Patient Gender: M             HR:           116 bpm. Exam Location:  [facility] Procedure: 2D Echo Indications:     Pulmonary  Embolus I26.09  History:         Patient has no prior history of Echocardiogram examinations.  Sonographer:     Overton Mam RDCS, FASE Referring Phys:  [id number] Vernetta Honey MANSY Diagnosing Phys: Julien Nordmann MD IMPRESSIONS  1. Left ventricular ejection fraction, by estimation, is 60 to 65%. The left ventricle has normal function. The left ventricle has no regional wall motion abnormalities. Left ventricular diastolic parameters are indeterminate.  2. Right ventricular systolic function is normal. The right ventricular size is normal. There is moderately elevated pulmonary artery systolic pressure. The estimated right ventricular systolic pressure is 54.0 mmHg.  3. Large pericardial effusion. The pericardial effusion is circumferential. There is no evidence of cardiac tamponade. 1.95 off the RV free wall, 2.16 cm off the  apical region, 1.70 cm off the LV free wall. IVC is not dilated at 1.16 cm  4. The mitral valve is normal in structure. No evidence of mitral valve regurgitation. No evidence of mitral stenosis.  5. The aortic valve is tricuspid. Aortic valve regurgitation is not visualized. No aortic stenosis is present.  6. The inferior vena cava is normal in size with greater than 50% respiratory variability, suggesting right atrial pressure of 3 mmHg. FINDINGS  Left Ventricle: Left ventricular ejection fraction, by estimation, is 60 to 65%. The left ventricle has normal function. The left ventricle has no regional wall motion abnormalities. The left ventricular internal cavity size was normal in size. There is  no left ventricular hypertrophy. Left ventricular diastolic parameters are indeterminate. Right Ventricle: The right ventricular size is normal. No increase in right ventricular wall thickness. Right ventricular systolic function is normal. There is moderately elevated pulmonary artery systolic pressure. The tricuspid regurgitant velocity is 3.50 m/s, and with an assumed right atrial pressure of 5 mmHg, the estimated right ventricular systolic pressure is 54.0 mmHg. Left Atrium: Left atrial size was normal in size. Right Atrium: Right atrial size was normal in size. Pericardium: A large pericardial effusion is present. The pericardial effusion is circumferential. There is no evidence of cardiac tamponade. Mitral Valve: The mitral valve is normal in structure. No evidence of mitral valve regurgitation. No evidence of mitral valve stenosis. Tricuspid Valve: The tricuspid valve is normal in structure. Tricuspid valve regurgitation is mild . No evidence of tricuspid stenosis. Aortic Valve: The aortic valve is tricuspid. Aortic valve regurgitation is not visualized. No aortic stenosis is present. Aortic valve peak gradient measures 12.0 mmHg. Pulmonic Valve: The pulmonic valve was normal in structure. Pulmonic valve  regurgitation is not visualized. No evidence of pulmonic stenosis. Aorta: The aortic root is normal in size and structure. Venous: The inferior vena cava is normal in size with greater than 50% respiratory variability, suggesting right atrial pressure of 3 mmHg. IAS/Shunts: No atrial level shunt detected by color flow Doppler.  LEFT VENTRICLE PLAX 2D LVIDd:         4.60 cm   Diastology LVIDs:         2.90 cm   LV e' medial:    11.70 cm/s LV PW:         1.00 cm   LV E/e' medial:  8.8 LV IVS:        1.00 cm   LV e' lateral:   18.60 cm/s LVOT diam:     2.10 cm   LV E/e' lateral: 5.5 LV SV:         67 LV SV Index:   37 LVOT Area:  3.46 cm  RIGHT VENTRICLE RV Basal diam:  2.50 cm RV S prime:     22.30 cm/s TAPSE (M-mode): 1.8 cm LEFT ATRIUM             Index        RIGHT ATRIUM          Index LA diam:        2.70 cm 1.48 cm/m   RA Area:     7.05 cm LA Vol (A2C):   45.8 ml 25.06 ml/m  RA Volume:   9.68 ml  5.30 ml/m LA Vol (A4C):   25.5 ml 13.95 ml/m LA Biplane Vol: 35.4 ml 19.37 ml/m  AORTIC VALVE                 PULMONIC VALVE AV Area (Vmax): 2.46 cm     PV Vmax:        1.10 m/s AV Vmax:        173.00 cm/s  PV Peak grad:   4.8 mmHg AV Peak Grad:   12.0 mmHg    RVOT Peak grad: 4 mmHg LVOT Vmax:      123.00 cm/s LVOT Vmean:     79.500 cm/s LVOT VTI:       0.193 m  AORTA                        PULMONARY ARTERY Ao Root diam: 3.50 cm        MPA diam:        3.00 cm Ao Asc diam:  2.70 cm MITRAL VALVE                TRICUSPID VALVE MV Area (PHT): 7.22 cm     TR Peak grad:   49.0 mmHg MV Decel Time: 105 msec     TR Vmax:        350.00 cm/s MV E velocity: 103.00 cm/s MV A velocity: 87.50 cm/s   SHUNTS MV E/A ratio:  1.18         Systemic VTI:  0.19 m                             Systemic Diam: 2.10 cm Julien Nordmann MD Electronically signed by Julien Nordmann MD Signature Date/Time: 03/01/2023/3:02:54 PM    Final    MR LIVER W WO CONTRAST  Result Date: 03/01/2023 CLINICAL DATA:  62 year old male with history of  shortness of breath recently diagnosed with blood clots in the legs. Hepatic masses identified on recent chest CTA. Further evaluation to exclude the possibility of malignancy. EXAM: MRI ABDOMEN WITHOUT AND WITH CONTRAST TECHNIQUE: Multiplanar multisequence MR imaging of the abdomen was performed both before and after the administration of intravenous contrast. CONTRAST:  7mL GADAVIST GADOBUTROL 1 MMOL/ML IV SOLN COMPARISON:  No prior abdominal MRI.  Chest CTA 02/27/2023. FINDINGS: Comment: Portions of today's examination are limited by considerable patient respiratory motion. Lower chest: Small right pleural effusion with areas of atelectasis and/or scarring in the right lung base. Hepatobiliary: There are multiple hepatic lesions highly concerning for widespread metastatic disease to the liver. The largest of these is a dominant lesion in the central aspect of the liver, predominantly centered in segment 8 (axial image 15 of series 4 and coronal image 22 series 3) measuring up to 13.7 x 9.6 x 12.1 cm. These lesions are heterogeneous in signal intensity but generally T1 hypointense, slightly T2  hyperintense, hypovascular with predominantly peripheral enhancement which increases slightly on delayed images, and extensive internal diffusion restriction. No definite hypervascular hepatic lesion is identified. Gallbladder is not visualized, likely surgically absent. No intra or extrahepatic biliary ductal dilatation. Pancreas: In the region of the pancreatic head (best appreciated on axial image 55 of series 14 and coronal image 45 of series 20) there is what appears to be a poorly defined hypovascular mass estimated to measure approximately 4.0 x 2.6 x 2.9 cm, which is located immediately anterior and lateral to the superior mesenteric vein adjacent to the splenic pulmonary confluence, and inferior to the proximal portal vein, with minimal obscuration of the intervening fat plane adjacent to the superior mesenteric  vein. This lesion appears to obstruct the main pancreatic duct (axial image 49 of series 14), proximal to which the main pancreatic duct is severely dilated measuring up to 12 mm in the body of the pancreas. Diffuse atrophy throughout the body and tail of the pancreas is noted. 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 Spleen:  Unremarkable. Adrenals/Urinary Tract: Multiple T1 hypointense, T2 hyperintense, nonenhancing lesions in the kidneys are compatible with simple cysts (Bosniak class 1), which require no imaging follow-up, largest of which measure up to approximately 2.2 cm in diameter in the lower pole of the left kidney. No aggressive appearing renal lesions. No hydroureteronephrosis in the visualized portions of the abdomen. Bilateral adrenal glands are normal in appearance. Stomach/Bowel: Visualized portions are unremarkable. Vascular/Lymphatic: No aneurysm identified in the visualized abdominal vasculature. No definite lymphadenopathy confidently identified in the abdomen. Other:  Trace volume of perihepatic ascites. Musculoskeletal: No aggressive appearing osseous lesions are noted in the visualized portions of the skeleton. IMPRESSION: 1. Large hypovascular mass in the region of the head of the pancreas causing occlusion of the main pancreatic duct, highly concerning for primary pancreatic neoplasm. Numerous hypovascular lesions scattered throughout the liver, presumably reflective of widespread metastatic disease to the liver, with the largest lesion a dominant mass centered in the right lobe of the liver measuring up to 13.7 x 9.6 x 12.1 cm. 2. Trace volume of perihepatic ascites. 3. Fluid collection anterior to the mass adjacent to the  head of the pancreas, presumably a proteinaceous/hemorrhagic pancreatic pseudocyst. 4. Small right pleural effusion. 5. Additional incidental findings, as above. Electronically Signed   By: Trudie Reed M.D.   On: 03/01/2023 05:35   CT Angio Chest PE W and/or Wo Contrast  Result Date: 02/27/2023 CLINICAL DATA:  Shortness of breath blood clots in leg EXAM: CT ANGIOGRAPHY CHEST WITH CONTRAST TECHNIQUE: Multidetector CT imaging of the chest was performed using the standard protocol during bolus administration of intravenous contrast. Multiplanar CT image reconstructions and MIPs were obtained to evaluate the vascular anatomy. RADIATION DOSE REDUCTION: This exam was performed according to the departmental dose-optimization program which includes automated exposure control, adjustment of the mA and/or kV according to patient size and/or use of iterative reconstruction technique. CONTRAST:  75mL OMNIPAQUE IOHEXOL 350 MG/ML SOLN COMPARISON:  Chest x-ray 02/27/2023 FINDINGS: Cardiovascular: Satisfactory opacification of the pulmonary arteries to the segmental level. Small nonocclusive segmental/subsegmental embolus within the left upper lobe, series 6, image 168. No other discrete emboli are visualized. Mild atherosclerosis. No aneurysm or dissection. Normal cardiac size. Moderate to large circumferential pericardial effusion  measuring up to 2.1 cm, slight increased density values suggesting complex fluid. Mediastinum/Nodes: Midline trachea. No thyroid mass. Subcarinal nodes measuring at least up to 2 cm. Esophagus within normal limits. Small clips in the posterior mediastinum. Lungs/Pleura: Small circumferential right pleural effusion. Calcifications or sutures at the posterior right lung base. Prominent peribronchovascular thickening within the right perihilar region and within the right middle and lower lobe bronchi. Multifocal bilateral ground-glass densities and areas of tree-in-bud density suspicious for  respiratory infection Upper Abdomen: Large hypodense right hepatic mass suspected, this measures at least 12.6 cm. Another hypodense inferior right hepatic lobe lesion measuring 2.8 cm on series 4, image 153 incompletely assessed. Musculoskeletal: No acute osseous abnormality. Review of the MIP images confirms the above findings. IMPRESSION: 1. Positive for small nonocclusive segmental/subsegmental pulmonary embolus within the left upper lobe. 2. Moderate to large circumferential pericardial effusion measuring up to 2.1 cm, slight increased density values suggesting complex fluid. Recommend correlation with echocardiogram 3. Small circumferential right pleural effusion. Prominent peribronchovascular thickening within the right perihilar region and within the right upper, middle and lower lobe bronchi, with mild mucous plugging in the right lower lobe. Multifocal bilateral ground-glass densities and areas of tree-in-bud density suspicious for respiratory infection/pneumonia, possible atypical infection. 4. Subcarinal adenopathy, reactive versus metastatic. 5. Right hepatic masses measuring up to 12.6 cm. Findings are suspicious for metastatic disease versus liver carcinoma. When the patient is clinically stable and able to follow directions and hold their breath (preferably as an outpatient) further evaluation with dedicated abdominal MRI should be considered. Critical Value/emergent results were called by telephone at the time of interpretation on 02/27/2023 at 11:02 pm to provider [facility] , who verbally acknowledged these results. Electronically Signed   By: Jasmine Pang M.D.   On: 02/27/2023 23:04   DG Chest 2 View  Result Date: 02/27/2023 CLINICAL DATA:  Shortness of breath EXAM: CHEST - 2 VIEW COMPARISON:  08/04/2013 FINDINGS: Left lung is grossly clear. Normal cardiac size. Small loculated pleural effusion versus pleural thickening. Airspace disease at the right middle lobe and right base. No  pneumothorax IMPRESSION: Airspace disease at the right middle lobe and right base, possible pneumonia. Small loculated pleural effusion versus pleural thickening. Electronically Signed   By: Jasmine Pang M.D.   On: 02/27/2023 22:45   US Venous Img Lower Bilateral (DVT)  Result Date: 02/26/2023 CLINICAL DATA:  Leg swelling for 2 weeks EXAM: BILATERAL LOWER EXTREMITY VENOUS DOPPLER ULTRASOUND TECHNIQUE: Gray-scale sonography with graded compression, as well as color Doppler and duplex ultrasound were performed to evaluate the lower extremity deep venous systems from the level of the common femoral vein and including the common femoral, femoral, profunda femoral, popliteal and calf veins including the posterior tibial, peroneal and gastrocnemius veins when visible. The superficial great saphenous vein was also interrogated. Spectral Doppler was utilized to evaluate flow at rest and with distal augmentation maneuvers in the common femoral, femoral and popliteal veins. COMPARISON:  None Available. FINDINGS: RIGHT LOWER EXTREMITY Common Femoral Vein: No evidence of thrombus. Normal compressibility, respiratory phasicity and response to augmentation. Saphenofemoral Junction: No evidence of thrombus. Normal compressibility and flow on color Doppler imaging. Profunda Femoral Vein: No evidence of thrombus. Normal compressibility and flow on color Doppler imaging. Femoral Vein: Poor flow on Doppler. Poor compression with hypoechoic material. Popliteal Vein: Poor flow on Doppler with poor compression and luminal hypoechoic appearance Calf Veins: Areas of thrombus along the posterior tibial and peroneal vein. Superficial Great Saphenous Vein: No evidence of  thrombus. Normal compressibility. Venous Reflux:  None. Other Findings:  None. LEFT LOWER EXTREMITY Common Femoral Vein: No evidence of thrombus. Normal compressibility, respiratory phasicity and response to augmentation. Saphenofemoral Junction: No evidence of  thrombus. Normal compressibility and flow on color Doppler imaging. Profunda Femoral Vein: No evidence of thrombus. Normal compressibility and flow on color Doppler imaging. Femoral Vein: No evidence of thrombus. Normal compressibility, respiratory phasicity and response to augmentation. Popliteal Vein: Incomplete compression and poor flow on Doppler, color and spectral Calf Veins: Areas of thrombus in the peroneal and posterior tibial veins. Superficial Great Saphenous Vein: No evidence of thrombus. Normal compressibility. Venous Reflux:  None. Other Findings:  None. Critical Value/emergent results were called by telephone at the time of interpretation on 02/26/2023 at 6:04 pm to provider [facility] , who verbally acknowledged these results. IMPRESSION: Bilateral DVT. Right femoral vein down through the calf veins. Left popliteal and calf veins. Electronically Signed   By: Karen Kays M.D.   On: 02/26/2023 18:14    PERFORMANCE STATUS (ECOG) : 2 - Symptomatic, <50% confined to bed  Review of Systems Unless otherwise noted, a complete review of systems is negative.  Physical Exam General: Thin, frail appearing Pulmonary: Unlabored Extremities: no edema, no joint deformities Skin: no rashes Neurological: Weakness but otherwise nonfocal  IMPRESSION: Patient seen in infusion.  He was accompanied by his wife.  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  Patient is starting cycle 1 gemcitabine Abraxane chemotherapy today.  Introduced palliative care services and attempted to establish therapeutic rapport.  Symptomatically, patient says he is mostly suffering from severe and persistent right shoulder pain.  This is impacting his ability to sleep.  Pain is worse with movement.  No clear osseous metastasis identified on CT but the patient did not have PET scan as he was hospitalized.  He  has been seen recently by orthopedics who was concerned about a rotator cuff injury.  I suspect that he would not be a surgical candidate now in light of his cancer diagnosis and active chemotherapy.  Will obtain bone scan for further evaluation.    Patient is taking oral hydromorphone (4mg ) several times daily which he says helps improve the pain.  He denies any adverse effects from pain meds.  At its worst, his pain is reportedly 10 out of 10 but will lessen to 6-7 out of 10 after taking hydromorphone.  Effects from hydromorphone are short-lived.  Discussed the differences between short acting and long-acting opioids.  Will start him on transdermal fentanyl.  Use administration discussed with patient and wife.  Discussed importance of bowel regimen to prevent opioid-induced constipation.  Patient previously prescribed naloxone.  At baseline, patient lives at home with his wife.  He is mostly independent with ADLs but does rely on his wife some for assistance if needed.  They have a daughter in [place] who is a Engineer, civil (consulting).  PLAN: -Continue current scope of treatment -Start transdermal fentanyl 25 mcg every 72 hours -Continue hydromorphone as needed for breakthrough pain -Daily bowel regimen -Bone scan -Referrals to social work, Journalist, newspaper, Sports administrator care -Will benefit from ACP conversation/MOST Form -Follow-up telephone visit 2 weeks and then RTC 4 weeks  Case and plan discussed with Dr. Donneta Romberg  Patient expressed understanding and was in agreement with this plan. He also understands that He can call the clinic at any time with any questions, concerns, or complaints.  Time Total: 30 minutes  Visit consisted of counseling and education dealing with the complex and emotionally intense issues of symptom management and palliative care in the setting of serious and potentially life-threatening illness.Greater than 50%  of this time was spent counseling and coordinating care related to the above  assessment and plan.  Signed by: Laurette Schimke, PhD, NP-C

## 2023-03-12 NOTE — Telephone Encounter (Signed)
Approved today by [facility] 2017 Approved. Authorization Expiration Date: 06/10/2023

## 2023-03-12 NOTE — Addendum Note (Signed)
Addended by: Laurette Schimke R on: 03/12/2023 11:32 AM   Modules accepted: Orders

## 2023-03-12 NOTE — Telephone Encounter (Signed)
Tony Benson (Key: [id number]) PA Case ID #: [id number]  Drug- fentaNYL 25MCG/HR 72 hr patches

## 2023-03-12 NOTE — Patient Instructions (Signed)
[facility] - A DEPT OF [facility]  Discharge Instructions: Thank you for choosing [facility] to provide your oncology and hematology care.  If you have a lab appointment with the Cancer Center, please go directly to the Cancer Center and check in at the registration area.  Wear comfortable clothing and clothing appropriate for easy access to any Portacath or PICC line.   We strive to give you quality time with your provider. You may need to reschedule your appointment if you arrive late (15 or more minutes).  Arriving late affects you and other patients whose appointments are after yours.  Also, if you miss three or more appointments without notifying the office, you may be dismissed from the clinic at the provider's discretion.      For prescription refill requests, have your pharmacy contact our office and allow 72 hours for refills to be completed.    Today you received the following chemotherapy and/or immunotherapy agents Gemzar and Abraxane.      To help prevent nausea and vomiting after your treatment, we encourage you to take your nausea medication as directed.  BELOW ARE SYMPTOMS THAT SHOULD BE REPORTED IMMEDIATELY: *FEVER GREATER THAN 100.4 F (38 C) OR HIGHER *CHILLS OR SWEATING *NAUSEA AND VOMITING THAT IS NOT CONTROLLED WITH YOUR NAUSEA MEDICATION *UNUSUAL SHORTNESS OF BREATH *UNUSUAL BRUISING OR BLEEDING *URINARY PROBLEMS (pain or burning when urinating, or frequent urination) *BOWEL PROBLEMS (unusual diarrhea, constipation, pain near the anus) TENDERNESS IN MOUTH AND THROAT WITH OR WITHOUT PRESENCE OF ULCERS (sore throat, sores in mouth, or a toothache) UNUSUAL RASH, SWELLING OR PAIN  UNUSUAL VAGINAL DISCHARGE OR ITCHING   Items with * indicate a potential emergency and should be followed up as soon as possible or go to the Emergency Department if any problems should occur.  Please show the CHEMOTHERAPY ALERT CARD or  IMMUNOTHERAPY ALERT CARD at check-in to the Emergency Department and triage nurse.  Should you have questions after your visit or need to cancel or reschedule your appointment, please contact [facility] - A DEPT OF [facility]  [phone] and follow the prompts.  Office hours are 8:00 a.m. to 4:30 p.m. Monday - Friday. Please note that voicemails left after 4:00 p.m. may not be returned until the following business day.  We are closed weekends and major holidays. You have access to a nurse at all times for urgent questions. Please call the main number to the clinic [phone] and follow the prompts.  For any non-urgent questions, you may also contact your provider using MyChart. We now offer e-Visits for anyone 40 and older to request care online for non-urgent symptoms. For details visit [email or web address].   Also download the MyChart app! Go to the app store, search "MyChart", open the app, select , and log in with your MyChart username and password.

## 2023-03-12 NOTE — Telephone Encounter (Signed)
Clinical Social Worker received referral from the medical provider to assess psychosocial needs.  CSW left a vm with contact information and requested a call back.

## 2023-03-12 NOTE — Progress Notes (Signed)
MRI liver 02/28/23, chest xray 03/05/23, liver biopsy 03/03/23, CT angio 02/27/23, [facility].  C/o rt shoulder pain 7/10, no injury.  No appetite, ensure bid.   Pain is a concern, dilaudid "knocks him out" so he doesn't want to take too much of that. Also, takes tylenol.

## 2023-03-15 ENCOUNTER — Other Ambulatory Visit: Payer: BC Managed Care – PPO

## 2023-03-15 ENCOUNTER — Inpatient Hospital Stay: Admission: RE | Admit: 2023-03-15 | Payer: BC Managed Care – PPO | Source: Ambulatory Visit

## 2023-03-17 ENCOUNTER — Telehealth: Payer: Self-pay

## 2023-03-17 ENCOUNTER — Telehealth: Payer: Self-pay | Admitting: *Deleted

## 2023-03-17 NOTE — Telephone Encounter (Signed)
Telephone call to patient for follow up after receiving first infusion.   No answer but left message stating we were calling to check on them.  Encouraged patient to call for any questions or concerns.   

## 2023-03-17 NOTE — Telephone Encounter (Signed)
Received FMLA for daughter Joice Lofts and a statement of Illness for patient, forms completed and sent for physician signature

## 2023-03-18 ENCOUNTER — Telehealth: Payer: Self-pay

## 2023-03-18 ENCOUNTER — Telehealth: Payer: Self-pay | Admitting: *Deleted

## 2023-03-18 NOTE — Telephone Encounter (Signed)
Clinical Social Work was referred by [employer] for assessment of psychosocial needs.  CSW attempted to contact patient by phone.  His voicemail was full.  CSW will attempt to meet patient in infusion.

## 2023-03-18 NOTE — Telephone Encounter (Signed)
Port placement has been scheduled for 03/24/23. Arrival time 10 am to [facility]. Pt and wife aware to be NPO after midnight. He will need a driver for procedure.

## 2023-03-19 ENCOUNTER — Encounter
Admission: RE | Admit: 2023-03-19 | Discharge: 2023-03-19 | Disposition: A | Discharge status: Home / Self Care | Payer: BC Managed Care – PPO | Source: Ambulatory Visit | Attending: Hospice and Palliative Medicine | Admitting: Hospice and Palliative Medicine

## 2023-03-19 ENCOUNTER — Encounter: Payer: Self-pay | Admitting: Internal Medicine

## 2023-03-19 DIAGNOSIS — C787 Secondary malignant neoplasm of liver and intrahepatic bile duct: Secondary | ICD-10-CM | POA: Insufficient documentation

## 2023-03-19 DIAGNOSIS — C259 Malignant neoplasm of pancreas, unspecified: Secondary | ICD-10-CM | POA: Diagnosis present

## 2023-03-19 MED ORDER — TECHNETIUM TC 99M MEDRONATE IV KIT
20.0000 | PACK | Freq: Once | INTRAVENOUS | Status: AC | PRN
  Administered 2023-03-19: 21.78 via INTRAVENOUS

## 2023-03-19 NOTE — Telephone Encounter (Signed)
Forms signed and patient informed via MyChart message. Copied for chart and original put at front desk for patient to pick up

## 2023-03-20 ENCOUNTER — Inpatient Hospital Stay: Payer: BC Managed Care – PPO

## 2023-03-20 ENCOUNTER — Encounter: Payer: Self-pay | Admitting: Nurse Practitioner

## 2023-03-20 ENCOUNTER — Inpatient Hospital Stay: Payer: BC Managed Care – PPO | Attending: Internal Medicine

## 2023-03-20 ENCOUNTER — Inpatient Hospital Stay (HOSPITAL_BASED_OUTPATIENT_CLINIC_OR_DEPARTMENT_OTHER): Payer: BC Managed Care – PPO | Admitting: Nurse Practitioner

## 2023-03-20 VITALS — BP 95/61 | HR 101 | Temp 96.9°F | Wt 150.0 lb

## 2023-03-20 DIAGNOSIS — C787 Secondary malignant neoplasm of liver and intrahepatic bile duct: Secondary | ICD-10-CM | POA: Insufficient documentation

## 2023-03-20 DIAGNOSIS — I82432 Acute embolism and thrombosis of left popliteal vein: Secondary | ICD-10-CM | POA: Insufficient documentation

## 2023-03-20 DIAGNOSIS — M25512 Pain in left shoulder: Secondary | ICD-10-CM | POA: Diagnosis not present

## 2023-03-20 DIAGNOSIS — R443 Hallucinations, unspecified: Secondary | ICD-10-CM | POA: Insufficient documentation

## 2023-03-20 DIAGNOSIS — Z09 Encounter for follow-up examination after completed treatment for conditions other than malignant neoplasm: Secondary | ICD-10-CM

## 2023-03-20 DIAGNOSIS — Z8546 Personal history of malignant neoplasm of prostate: Secondary | ICD-10-CM | POA: Insufficient documentation

## 2023-03-20 DIAGNOSIS — C250 Malignant neoplasm of head of pancreas: Secondary | ICD-10-CM | POA: Diagnosis not present

## 2023-03-20 DIAGNOSIS — G893 Neoplasm related pain (acute) (chronic): Secondary | ICD-10-CM | POA: Insufficient documentation

## 2023-03-20 DIAGNOSIS — R630 Anorexia: Secondary | ICD-10-CM | POA: Insufficient documentation

## 2023-03-20 DIAGNOSIS — E1165 Type 2 diabetes mellitus with hyperglycemia: Secondary | ICD-10-CM | POA: Diagnosis not present

## 2023-03-20 DIAGNOSIS — R7989 Other specified abnormal findings of blood chemistry: Secondary | ICD-10-CM | POA: Diagnosis not present

## 2023-03-20 DIAGNOSIS — I3139 Other pericardial effusion (noninflammatory): Secondary | ICD-10-CM | POA: Diagnosis not present

## 2023-03-20 DIAGNOSIS — Z7901 Long term (current) use of anticoagulants: Secondary | ICD-10-CM | POA: Insufficient documentation

## 2023-03-20 DIAGNOSIS — C259 Malignant neoplasm of pancreas, unspecified: Secondary | ICD-10-CM

## 2023-03-20 DIAGNOSIS — R748 Abnormal levels of other serum enzymes: Secondary | ICD-10-CM | POA: Insufficient documentation

## 2023-03-20 DIAGNOSIS — Z5111 Encounter for antineoplastic chemotherapy: Secondary | ICD-10-CM | POA: Insufficient documentation

## 2023-03-20 DIAGNOSIS — I2699 Other pulmonary embolism without acute cor pulmonale: Secondary | ICD-10-CM | POA: Insufficient documentation

## 2023-03-20 DIAGNOSIS — D72829 Elevated white blood cell count, unspecified: Secondary | ICD-10-CM | POA: Diagnosis not present

## 2023-03-20 DIAGNOSIS — M25511 Pain in right shoulder: Secondary | ICD-10-CM | POA: Diagnosis not present

## 2023-03-20 DIAGNOSIS — I82411 Acute embolism and thrombosis of right femoral vein: Secondary | ICD-10-CM | POA: Diagnosis not present

## 2023-03-20 DIAGNOSIS — R970 Elevated carcinoembryonic antigen [CEA]: Secondary | ICD-10-CM | POA: Diagnosis not present

## 2023-03-20 DIAGNOSIS — I959 Hypotension, unspecified: Secondary | ICD-10-CM | POA: Insufficient documentation

## 2023-03-20 LAB — CBC WITH DIFFERENTIAL (CANCER CENTER ONLY)
Abs Immature Granulocytes: 0.21 10*3/uL — ABNORMAL HIGH (ref 0.00–0.07)
Basophils Absolute: 0.1 10*3/uL (ref 0.0–0.1)
Basophils Relative: 1 %
Eosinophils Absolute: 0.4 10*3/uL (ref 0.0–0.5)
Eosinophils Relative: 4 %
HCT: 28.1 % — ABNORMAL LOW (ref 39.0–52.0)
Hemoglobin: 8.4 g/dL — ABNORMAL LOW (ref 13.0–17.0)
Immature Granulocytes: 2 %
Lymphocytes Relative: 3 %
Lymphs Abs: 0.3 10*3/uL — ABNORMAL LOW (ref 0.7–4.0)
MCH: 25.9 pg — ABNORMAL LOW (ref 26.0–34.0)
MCHC: 29.9 g/dL — ABNORMAL LOW (ref 30.0–36.0)
MCV: 86.7 fL (ref 80.0–100.0)
Monocytes Absolute: 0.9 10*3/uL (ref 0.1–1.0)
Monocytes Relative: 9 %
Neutro Abs: 8.6 10*3/uL — ABNORMAL HIGH (ref 1.7–7.7)
Neutrophils Relative %: 81 %
Platelet Count: 313 10*3/uL (ref 150–400)
RBC: 3.24 MIL/uL — ABNORMAL LOW (ref 4.22–5.81)
RDW: 15.1 % (ref 11.5–15.5)
WBC Count: 10.5 10*3/uL (ref 4.0–10.5)
nRBC: 0 % (ref 0.0–0.2)

## 2023-03-20 LAB — CMP (CANCER CENTER ONLY)
ALT: 27 U/L (ref 0–44)
AST: 34 U/L (ref 15–41)
Albumin: 2.1 g/dL — ABNORMAL LOW (ref 3.5–5.0)
Alkaline Phosphatase: 778 U/L — ABNORMAL HIGH (ref 38–126)
Anion gap: 8 (ref 5–15)
BUN: 40 mg/dL — ABNORMAL HIGH (ref 8–23)
CO2: 24 mmol/L (ref 22–32)
Calcium: 9.7 mg/dL (ref 8.9–10.3)
Chloride: 105 mmol/L (ref 98–111)
Creatinine: 0.71 mg/dL (ref 0.61–1.24)
GFR, Estimated: >60 mL/min (ref >60)
Glucose, Bld: 258 mg/dL — ABNORMAL HIGH (ref 70–99)
Potassium: 4.4 mmol/L (ref 3.5–5.1)
Sodium: 137 mmol/L (ref 135–145)
Total Bilirubin: 0.5 mg/dL (ref <1.2)
Total Protein: 6.7 g/dL (ref 6.5–8.1)

## 2023-03-20 MED ORDER — HYDROMORPHONE HCL 4 MG PO TABS: 4.0000 mg | ORAL_TABLET | ORAL | 0 refills | Status: DC | PRN

## 2023-03-20 MED ORDER — ACETAMINOPHEN 325 MG PO TABS
ORAL_TABLET | ORAL | Status: AC
  Filled 2023-03-20: qty 2

## 2023-03-20 MED ORDER — ACETAMINOPHEN 325 MG PO TABS
650.0000 mg | ORAL_TABLET | Freq: Once | ORAL | Status: AC
  Administered 2023-03-20: 650 mg via ORAL

## 2023-03-20 MED ORDER — SODIUM CHLORIDE 0.9% FLUSH
10.0000 mL | Freq: Once | INTRAVENOUS | Status: DC | PRN
Start: 2023-03-20 — End: 2023-03-20
  Filled 2023-03-20: qty 10

## 2023-03-20 MED ORDER — FENTANYL 12 MCG/HR TD PT72: 1.0000 | MEDICATED_PATCH | TRANSDERMAL | 0 refills | Status: DC

## 2023-03-20 MED ORDER — SODIUM CHLORIDE 0.9 % IV SOLN
Freq: Once | INTRAVENOUS | Status: AC
  Filled 2023-03-20: qty 250

## 2023-03-20 NOTE — Progress Notes (Signed)
[facility] CONSULT NOTE  Patient Care Team: Sherrie Mustache, MD as PCP - General (Internal Medicine) Luretha Murphy, MD as Consulting Physician (General Surgery) Earna Coder, MD as Consulting Physician (Oncology) Benita Gutter, RN as Oncology Nurse Navigator  CHIEF COMPLAINTS/PURPOSE OF CONSULTATION: pancreatic cancer  Oncology History Overview Note   # NOV 2024- Liver, needle/core biopsy,  :      - METASTATIC CARCINOMA      - SEE NOTE       Diagnosis Note : The patient's clinical history of prostate cancer, recent      pleural effusion and MRI findings significant for a large head of pancreas mass      and multiple liver masses are noted.  Immunohistochemistry is performed on block      1B.  The tumor cells are diffusely positive for CK7 while negative for CK20,      CDX-2, TTF-1, and NKX3.1 (prostate marker).  The immunohistochemical staining      pattern is not specific and may be seen in cancers of the upper GI tract,      pancreaticobiliary system, lung (less likely due to TTF-1 negativity) and      breast.  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  Clinical and radiological correlation recommended.  Dr. [place]      reviewed the case and agrees with the above diagnosis.   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)  03/05/2023 Initial Diagnosis   Pancreatic cancer metastasized to liver (HCC)   03/12/2023 -  Chemotherapy   Patient is on Treatment Plan : PANCREATIC Abraxane D1,8,15 + Gemcitabine D1,8,15 q28d     03/12/2023 Cancer Staging   Staging form: Exocrine Pancreas, AJCC 8th Edition - Clinical: Stage IV (cT4, cN2, pM1) - Signed by Earna Coder, MD on 03/12/2023    Oncology History Overview Note   # NOV 2024- Liver, needle/core biopsy,  :      - METASTATIC  CARCINOMA      - SEE NOTE       Diagnosis Note : The patient's clinical history of prostate cancer, recent      pleural effusion and MRI findings significant for a large head of pancreas mass      and multiple liver masses are noted.  Immunohistochemistry is performed on block      1B.  The tumor cells are diffusely positive for CK7 while negative for CK20,      CDX-2, TTF-1, and NKX3.1 (prostate marker).  The immunohistochemical staining      pattern is not specific and may be seen in cancers of the upper GI tract,      pancreaticobiliary system, lung (less likely due to TTF-1 negativity) and      breast.  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  Clinical and radiological correlation recommended.  Dr. [place]      reviewed the case and agrees with the above diagnosis.   # NOV 26th, 2024- cycle #1 of gemcitabine Abraxane   Pancreatic cancer metastasized to liver [facility])  03/05/2023 Initial Diagnosis   Pancreatic cancer metastasized to liver [facility])   03/12/2023 -  Chemotherapy   Patient is on Treatment  Plan : PANCREATIC Abraxane D1,8,15 + Gemcitabine D1,8,15 q28d     03/12/2023 Cancer Staging   Staging form: Exocrine Pancreas, AJCC 8th Edition - Clinical: Stage IV (cT4, cN2, pM1) - Signed by Earna Coder, MD on 03/12/2023     HISTORY OF PRESENTING ILLNESS: Patient in wheelchair. Accompanied by his wife.   [name] 62 y.o. male pleasant patient with newly diagnosed pancreatic cancer metastatic to liver; and history of poorly controlled diabetes; acute PE/DVT on anticoagulation, s/p cycle 1 of gem-abraxane, who returns to clinic for follow up. He had rigors night after treatment but otherwise handled treatment well. Denies nausea or vomiting. Appetite remains poor. Endorses taste changes which are ongoing. Fatigue is most bothersome as well as pain of left  shoulder. He endorses little pain. Has had a few episodes of hallucinations and would like to decrease fentanyl dose to see if that improves. Takes dilaudid tablets for breakthrough sparingly. Has persistent cough but not worse.    Review of Systems  Constitutional:  Positive for malaise/fatigue and weight loss. Negative for chills, diaphoresis and fever.  HENT:  Positive for sore throat. Negative for nosebleeds.   Eyes:  Negative for double vision.  Respiratory:  Positive for cough and shortness of breath. Negative for hemoptysis, sputum production and wheezing.   Cardiovascular:  Negative for chest pain, palpitations, orthopnea and leg swelling.  Gastrointestinal:  Negative for abdominal pain, blood in stool, constipation, diarrhea, heartburn, melena, nausea and vomiting.  Genitourinary:  Negative for dysuria, frequency and urgency.  Musculoskeletal:  Positive for back pain and joint pain. Negative for falls.  Skin:  Negative for itching and rash.  Neurological:  Negative for dizziness, tingling, focal weakness, weakness and headaches.  Endo/Heme/Allergies:  Does not bruise/bleed easily.  Psychiatric/Behavioral:  Negative for depression. The patient is not nervous/anxious and does not have insomnia.     MEDICAL HISTORY:  Past Medical History:  Diagnosis Date   Asthma    Cancer (HCC)    prostate   Diabetes mellitus without complication (HCC)    type II   Hypertension    Sleep apnea    uses Cpap machine     SURGICAL HISTORY: Past Surgical History:  Procedure Laterality Date   CHOLECYSTECTOMY     COLONOSCOPY WITH PROPOFOL N/A 11/18/2018   Procedure: COLONOSCOPY WITH PROPOFOL;  Surgeon: Pasty Spillers, MD;  Location: [facility] ENDOSCOPY;  Service: Gastroenterology;  Laterality: N/A;   COLONOSCOPY WITH PROPOFOL N/A 02/05/2023   Procedure: COLONOSCOPY WITH PROPOFOL;  Surgeon: Toney Reil, MD;  Location: [facility] ENDOSCOPY;  Service: Gastroenterology;  Laterality: N/A;   IR US  LIVER BIOPSY  03/03/2023   LAPAROSCOPIC RETROPUBIC PROSTATECTOMY     2023   LUNG SURGERY     "ligation of thoracic duct"   PROSTATE BIOPSY N/A 09/21/2020   Procedure: PROSTATE BIOPSY [name];  Surgeon: [name], MD;  Location: [facility] ORS;  Service: Urology;  Laterality: N/A;    SOCIAL HISTORY: Social History   Socioeconomic History   Marital status: Married    Spouse name: Not on file   Number of children: Not on file   Years of education: Not on file   Highest education level: Not on file  Occupational History   Occupation: Automotive engineer   Tobacco Use   Smoking status: Never   Smokeless tobacco: Never  Vaping Use   Vaping status: Never Used  Substance and Sexual Activity   Alcohol use: No   Drug use: No  Sexual activity: Not on file  Other Topics Concern   Not on file  Social History Narrative   Not on file   Social Determinants of Health   Financial Resource Strain: Not on file  Food Insecurity: No Food Insecurity (02/28/2023)   Hunger Vital Sign    Worried About Running Out of Food in the Last Year: Never true    Ran Out of Food in the Last Year: Never true  Transportation Needs: No Transportation Needs (02/28/2023)   PRAPARE - Administrator, Civil Service (Medical): No    Lack of Transportation (Non-Medical): No  Physical Activity: Not on file  Stress: Not on file  Social Connections: Not on file  Intimate Partner Violence: Not At Risk (02/28/2023)   Humiliation, Afraid, Rape, and Kick questionnaire    Fear of Current or Ex-Partner: No    Emotionally Abused: No    Physically Abused: No    Sexually Abused: No    FAMILY HISTORY: Family History  Problem Relation Age of Onset   Allergies Sister     ALLERGIES:  is allergic to other, morphine and codeine, tree extract, and wound dressing adhesive.  MEDICATIONS:  Current Outpatient Medications  Medication Sig Dispense Refill   ALPRAZolam (XANAX) 0.5 MG tablet Take 1 tablet (0.5 mg  total) by mouth 2 (two) times daily as needed for anxiety. 10 tablet 0   amLODipine (NORVASC) 10 MG tablet Take 10 mg by mouth daily.  1   apixaban (ELIQUIS) 5 MG TABS tablet Take 2 tablets (10 mg total) by mouth 2 (two) times daily for 5 days, THEN 1 tablet (5 mg total) 2 (two) times daily for 25 days. 70 tablet 0   atorvastatin (LIPITOR) 10 MG tablet Take 10 mg by mouth daily.     feeding supplement (ENSURE ENLIVE / ENSURE PLUS) LIQD Take 237 mLs by mouth 2 (two) times daily between meals. 14220 mL 0   fentaNYL (DURAGESIC) 25 MCG/HR Place 1 patch onto the skin every 3 (three) days. 10 patch 0   insulin aspart (NOVOLOG) 100 UNIT/ML FlexPen Inject 5 Units into the skin 3 (three) times daily with meals. If eating and Blood Glucose (BG) 80 or higher inject 5 units for meal coverage and add correction dose per scale. If not eating, correction dose only. BG <150= 0 unit; BG 150-200= 1 unit; BG 201-250= 2 unit; BG 251-300= 3 unit; BG 301-350= 4 unit; BG 351-400= 5 unit; BG >400= 6 unit and Call Primary care. 15 mL 0   JARDIANCE 25 MG TABS tablet Take 25 mg by mouth daily.     lidocaine-prilocaine (EMLA) cream Apply on the port. 30 -45 min  prior to port access. 30 g 3   losartan (COZAAR) 100 MG tablet Take 100 mg by mouth daily.  1   metoprolol tartrate (LOPRESSOR) 50 MG tablet Take 1 tablet (50 mg total) by mouth 2 (two) times daily. 60 tablet 0   mometasone-formoterol (DULERA) 100-5 MCG/ACT AERO Inhale 2 puffs into the lungs 2 (two) times daily as needed for wheezing or shortness of breath.     naloxone (NARCAN) nasal spray 4 mg/0.1 mL One spray in nostril for unresponsiveness 2 each 0   Omega-3 Fatty Acids (FISH OIL) 1000 MG CAPS Take 1,000 mg by mouth daily.     ondansetron (ZOFRAN) 8 MG tablet One pill every 8 hours as needed for nausea/vomitting. 40 tablet 1   prochlorperazine (COMPAZINE) 10 MG tablet Take 1 tablet (10 mg  total) by mouth every 6 (six) hours as needed for nausea or vomiting. 40 tablet  1   No current facility-administered medications for this visit.    PHYSICAL EXAMINATION: Vitals:   03/20/23 1412  BP: 95/61  Pulse: (!) 101  Temp: (!) 96.9 F (36.1 C)  SpO2: 100%    Filed Weights   03/20/23 1412  Weight: 150 lb (68 kg)   Physical Exam Vitals reviewed.  Constitutional:      General: He is not in acute distress.    Comments: Thin build. Wheelchair. Accompanied by wife  HENT:     Mouth/Throat:     Mouth: Mucous membranes are dry.     Comments: hoarseness Eyes:     General: No scleral icterus. Cardiovascular:     Rate and Rhythm: Normal rate and regular rhythm.  Pulmonary:     Comments: Decreased breath sounds bilaterally.  Abdominal:     Tenderness: There is no guarding.  Musculoskeletal:        General: No deformity.     Cervical back: Normal range of motion.  Skin:    General: Skin is warm.     Coloration: Skin is not pale.  Neurological:     Mental Status: He is alert and oriented to person, place, and time.  Psychiatric:        Mood and Affect: Mood normal.        Behavior: Behavior normal.     LABORATORY DATA:  I have reviewed the data as listed Lab Results  Component Value Date   WBC 10.5 03/20/2023   HGB 8.4 (L) 03/20/2023   HCT 28.1 (L) 03/20/2023   MCV 86.7 03/20/2023   PLT 313 03/20/2023   Recent Labs    03/06/23 0619 03/07/23 0248 03/12/23 0846 03/20/23 1349  NA 137  --  136 137  K 4.4  --  4.6 4.4  CL 102  --  101 105  CO2 27  --  26 24  GLUCOSE 145*  --  224* 258*  BUN 16  --  20 40*  CREATININE 0.67  --  0.73 0.71  CALCIUM 9.2  --  10.3 9.7  GFRNONAA >60  --  >60 >60  PROT 6.4* 6.5 7.4 6.7  ALBUMIN 2.0* 2.0* 2.3* 2.1*  AST 70* 31 84* 34  ALT 48* 37 77* 27  ALKPHOS 534* 463* 1,195* 778*  BILITOT 0.7 0.5 0.6 0.5  BILIDIR  --  0.1  --   --   IBILI  --  0.4  --   --     RADIOGRAPHIC STUDIES: I have personally reviewed the radiological images as listed and agreed with the findings in the report. DG Chest  Port 1 View  Result Date: 03/05/2023 CLINICAL DATA:  Shortness of breath.  Follow-up exam. EXAM: PORTABLE CHEST 1 VIEW COMPARISON:  Chest radiograph dated 02/27/2023. FINDINGS: Small right pleural effusion. Right lung base opacity as well as faint clusters of ground-glass density involving the right mid to lower lung field consistent with pneumonia. Overall interval worsening of pulmonary infiltrate since the radiograph of 02/27/2023. The left lung is clear. No pneumothorax. Stable cardiac silhouette. No acute osseous pathology. IMPRESSION: Interval worsening of right lung pneumonia. Electronically Signed   By: Elgie Collard M.D.   On: 03/05/2023 23:52   ECHOCARDIOGRAM LIMITED  Result Date: 03/04/2023    ECHOCARDIOGRAM LIMITED REPORT   Patient Name:   YUVRAJ LEIMAN Date of Exam: 03/04/2023 Medical Rec #:  [id number]  Height:       69.0 in Accession #:    [id number]    Weight:       148.8 lb Date of Birth:  1960/07/06    BSA:          1.822 m Patient Age:    61 years      BP:           117/60 mmHg Patient Gender: M             HR:           112 bpm. Exam Location:  [facility] Procedure: Limited Echo, Color Doppler and Cardiac Doppler Indications:     Pericardial Effusion I31.3  History:         Patient has prior history of Echocardiogram examinations, most                  recent 03/01/2023. Risk Factors:Diabetes, Hypertension and                  Sleep Apnea.  Sonographer:     Cristela Blue Referring Phys:  [id number] Marrion Coy Diagnosing Phys: Yvonne Kendall MD IMPRESSIONS  1. Left ventricular ejection fraction, by estimation, is >55%. The left ventricle has normal function. The left ventricle has no regional wall motion abnormalities.  2. Right ventricular systolic function is normal. The right ventricular size is normal.  3. Moderate pericardial effusion. The pericardial effusion is circumferential. There is no evidence of cardiac tamponade.  4. The mitral valve is normal in structure. Comparison(s): A prior  study was performed on 03/01/2023. Pericardial effusion appears slightly smaller. FINDINGS  Left Ventricle: Left ventricular ejection fraction, by estimation, is >55%. The left ventricle has normal function. The left ventricle has no regional wall motion abnormalities. The left ventricular internal cavity size was normal in size. There is borderline left ventricular hypertrophy. Right Ventricle: The right ventricular size is normal. No increase in right ventricular wall thickness. Right ventricular systolic function is normal. Pericardium: A moderately sized pericardial effusion is present. The pericardial effusion is circumferential. There is no evidence of cardiac tamponade. Mitral Valve: The mitral valve is normal in structure. Tricuspid Valve: The tricuspid valve is grossly normal. Tricuspid valve regurgitation is mild. Aorta: The aortic root is normal in size and structure. Venous: The inferior vena cava was not well visualized. Additional Comments: Spectral Doppler performed. Color Doppler performed.  LEFT VENTRICLE PLAX 2D LVIDd:         4.30 cm LVIDs:         2.30 cm LV PW:         1.12 cm LV IVS:        0.95 cm   AORTA Ao Root diam: 3.30 cm Yvonne Kendall MD Electronically signed by Yvonne Kendall MD Signature Date/Time: 03/04/2023/11:43:49 AM    Final    IR US LIVER BIOPSY  Result Date: 03/03/2023 INDICATION: 62 year old male with pancreatic mass and concern for hepatic metastatic disease. EXAM: LIVER CORE BIOPSY MEDICATIONS: None. ANESTHESIA/SEDATION: Moderate (conscious) sedation was employed during this procedure. A total of Versed 1 mg and Fentanyl 50 mcg was administered intravenously. Moderate Sedation Time: 10 minutes. The patient's level of consciousness and vital signs were monitored continuously by radiology nursing throughout the procedure under my direct supervision. FLUOROSCOPY TIME:  None. COMPLICATIONS: None immediate. PROCEDURE: Informed written consent was obtained from the patient  after a thorough discussion of the procedural risks, benefits and alternatives. All questions were addressed. Maximal Sterile Barrier Technique  was utilized including caps, mask, sterile gowns, sterile gloves, sterile drape, hand hygiene and skin antiseptic. A timeout was performed prior to the initiation of the procedure. The right upper quadrant was interrogated with ultrasound. Multiple echogenic masses present throughout the liver. A suitable skin entry site was selected and marked. The region was sterilely prepped and draped in the standard fashion using chlorhexidine skin prep. Local anesthesia was attained by infiltration with 1% lidocaine. A small dermatotomy was made. Under real-time sonographic guidance, a 17 gauge introducer needle was advanced into the margin of the mass. Multiple 18 gauge biopsies were then coaxially obtained using the BioPince automated biopsy device. Biopsy samples were placed in formalin and delivered to pathology for further analysis. As a 17 gauge introducer needle was removed, the biopsy tract was embolized with a Gel-Foam slurry. Post biopsy ultrasound imaging demonstrates no evidence of active hemorrhage or perihepatic hematoma. The patient tolerated the procedure well. IMPRESSION: Technically successful ultrasound-guided core biopsy of hepatic lesion. Electronically Signed   By: Malachy Moan M.D.   On: 03/03/2023 14:59   ECHOCARDIOGRAM COMPLETE  Result Date: 03/01/2023    ECHOCARDIOGRAM REPORT   Patient Name:   LAQUANE DELAUGHTER Date of Exam: 03/01/2023 Medical Rec #:  [id number]     Height:       69.0 in Accession #:    [id number]    Weight:       149.9 lb Date of Birth:  [birth year]    BSA:          1.828 m Patient Age:    61 years      BP:           138/75 mmHg Patient Gender: M             HR:           116 bpm. Exam Location:  [facility] Procedure: 2D Echo Indications:     Pulmonary Embolus I26.09  History:         Patient has no prior history of Echocardiogram  examinations.  Sonographer:     Overton Mam RDCS, FASE Referring Phys:  [id number] Vernetta Honey MANSY Diagnosing Phys: Julien Nordmann MD IMPRESSIONS  1. Left ventricular ejection fraction, by estimation, is 60 to 65%. The left ventricle has normal function. The left ventricle has no regional wall motion abnormalities. Left ventricular diastolic parameters are indeterminate.  2. Right ventricular systolic function is normal. The right ventricular size is normal. There is moderately elevated pulmonary artery systolic pressure. The estimated right ventricular systolic pressure is 54.0 mmHg.  3. Large pericardial effusion. The pericardial effusion is circumferential. There is no evidence of cardiac tamponade. 1.95 off the RV free wall, 2.16 cm off the apical region, 1.70 cm off the LV free wall. IVC is not dilated at 1.16 cm  4. The mitral valve is normal in structure. No evidence of mitral valve regurgitation. No evidence of mitral stenosis.  5. The aortic valve is tricuspid. Aortic valve regurgitation is not visualized. No aortic stenosis is present.  6. The inferior vena cava is normal in size with greater than 50% respiratory variability, suggesting right atrial pressure of 3 mmHg. FINDINGS  Left Ventricle: Left ventricular ejection fraction, by estimation, is 60 to 65%. The left ventricle has normal function. The left ventricle has no regional wall motion abnormalities. The left ventricular internal cavity size was normal in size. There is  no left ventricular hypertrophy. Left ventricular diastolic parameters are indeterminate. Right Ventricle: The right ventricular  size is normal. No increase in right ventricular wall thickness. Right ventricular systolic function is normal. There is moderately elevated pulmonary artery systolic pressure. The tricuspid regurgitant velocity is 3.50 m/s, and with an assumed right atrial pressure of 5 mmHg, the estimated right ventricular systolic pressure is 54.0 mmHg. Left Atrium: Left  atrial size was normal in size. Right Atrium: Right atrial size was normal in size. Pericardium: A large pericardial effusion is present. The pericardial effusion is circumferential. There is no evidence of cardiac tamponade. Mitral Valve: The mitral valve is normal in structure. No evidence of mitral valve regurgitation. No evidence of mitral valve stenosis. Tricuspid Valve: The tricuspid valve is normal in structure. Tricuspid valve regurgitation is mild . No evidence of tricuspid stenosis. Aortic Valve: The aortic valve is tricuspid. Aortic valve regurgitation is not visualized. No aortic stenosis is present. Aortic valve peak gradient measures 12.0 mmHg. Pulmonic Valve: The pulmonic valve was normal in structure. Pulmonic valve regurgitation is not visualized. No evidence of pulmonic stenosis. Aorta: The aortic root is normal in size and structure. Venous: The inferior vena cava is normal in size with greater than 50% respiratory variability, suggesting right atrial pressure of 3 mmHg. IAS/Shunts: No atrial level shunt detected by color flow Doppler.  LEFT VENTRICLE PLAX 2D LVIDd:         4.60 cm   Diastology LVIDs:         2.90 cm   LV e' medial:    11.70 cm/s LV PW:         1.00 cm   LV E/e' medial:  8.8 LV IVS:        1.00 cm   LV e' lateral:   18.60 cm/s LVOT diam:     2.10 cm   LV E/e' lateral: 5.5 LV SV:         67 LV SV Index:   37 LVOT Area:     3.46 cm  RIGHT VENTRICLE RV Basal diam:  2.50 cm RV S prime:     22.30 cm/s TAPSE (M-mode): 1.8 cm LEFT ATRIUM             Index        RIGHT ATRIUM          Index LA diam:        2.70 cm 1.48 cm/m   RA Area:     7.05 cm LA Vol (A2C):   45.8 ml 25.06 ml/m  RA Volume:   9.68 ml  5.30 ml/m LA Vol (A4C):   25.5 ml 13.95 ml/m LA Biplane Vol: 35.4 ml 19.37 ml/m  AORTIC VALVE                 PULMONIC VALVE AV Area (Vmax): 2.46 cm     PV Vmax:        1.10 m/s AV Vmax:        173.00 cm/s  PV Peak grad:   4.8 mmHg AV Peak Grad:   12.0 mmHg    RVOT Peak grad: 4  mmHg LVOT Vmax:      123.00 cm/s LVOT Vmean:     79.500 cm/s LVOT VTI:       0.193 m  AORTA                        PULMONARY ARTERY Ao Root diam: 3.50 cm        MPA diam:        3.00  cm Ao Asc diam:  2.70 cm MITRAL VALVE                TRICUSPID VALVE MV Area (PHT): 7.22 cm     TR Peak grad:   49.0 mmHg MV Decel Time: 105 msec     TR Vmax:        350.00 cm/s MV E velocity: 103.00 cm/s MV A velocity: 87.50 cm/s   SHUNTS MV E/A ratio:  1.18         Systemic VTI:  0.19 m                             Systemic Diam: 2.10 cm Julien Nordmann MD Electronically signed by Julien Nordmann MD Signature Date/Time: 03/01/2023/3:02:54 PM    Final    MR LIVER W WO CONTRAST  Result Date: 03/01/2023 CLINICAL DATA:  62 year old male with history of shortness of breath recently diagnosed with blood clots in the legs. Hepatic masses identified on recent chest CTA. Further evaluation to exclude the possibility of malignancy. EXAM: MRI ABDOMEN WITHOUT AND WITH CONTRAST TECHNIQUE: Multiplanar multisequence MR imaging of the abdomen was performed both before and after the administration of intravenous contrast. CONTRAST:  7mL GADAVIST GADOBUTROL 1 MMOL/ML IV SOLN COMPARISON:  No prior abdominal MRI.  Chest CTA 02/27/2023. FINDINGS: Comment: Portions of today's examination are limited by considerable patient respiratory motion. Lower chest: Small right pleural effusion with areas of atelectasis and/or scarring in the right lung base. Hepatobiliary: There are multiple hepatic lesions highly concerning for widespread metastatic disease to the liver. The largest of these is a dominant lesion in the central aspect of the liver, predominantly centered in segment 8 (axial image 15 of series 4 and coronal image 22 series 3) measuring up to 13.7 x 9.6 x 12.1 cm. These lesions are heterogeneous in signal intensity but generally T1 hypointense, slightly T2 hyperintense, hypovascular with predominantly peripheral enhancement which increases slightly  on delayed images, and extensive internal diffusion restriction. No definite hypervascular hepatic lesion is identified. Gallbladder is not visualized, likely surgically absent. No intra or extrahepatic biliary ductal dilatation. Pancreas: In the region of the pancreatic head (best appreciated on axial image 55 of series 14 and coronal image 45 of series 20) there is what appears to be a poorly defined hypovascular mass estimated to measure approximately 4.0 x 2.6 x 2.9 cm, which is located immediately anterior and lateral to the superior mesenteric vein adjacent to the splenic pulmonary confluence, and inferior to the proximal portal vein, with minimal obscuration of the intervening fat plane adjacent to the superior mesenteric vein. This lesion appears to obstruct the main pancreatic duct (axial image 49 of series 14), proximal to which the main pancreatic duct is severely dilated measuring up to 12 mm in the body of the pancreas. Diffuse atrophy throughout the body and tail of the pancreas is noted. 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 Spleen:  Unremarkable. Adrenals/Urinary Tract: Multiple T1 hypointense, T2 hyperintense, nonenhancing lesions in the kidneys are compatible with simple cysts (Bosniak class 1), which require no imaging follow-up, largest of which measure up to approximately 2.2 cm in diameter in the lower pole of the left kidney. No aggressive appearing renal lesions. No  hydroureteronephrosis in the visualized portions of the abdomen. Bilateral adrenal glands are normal in appearance. Stomach/Bowel: Visualized portions are unremarkable. Vascular/Lymphatic: No aneurysm identified in the visualized abdominal vasculature. No  definite lymphadenopathy confidently identified in the abdomen. Other:  Trace volume of perihepatic ascites. Musculoskeletal: No aggressive appearing osseous lesions are noted in the visualized portions of the skeleton. IMPRESSION: 1. Large hypovascular mass in the region of the head of the pancreas causing occlusion of the main pancreatic duct, highly concerning for primary pancreatic neoplasm. Numerous hypovascular lesions scattered throughout the liver, presumably reflective of widespread metastatic disease to the liver, with the largest lesion a dominant mass centered in the right lobe of the liver measuring up to 13.7 x 9.6 x 12.1 cm. 2. Trace volume of perihepatic ascites. 3. Fluid collection anterior to the mass adjacent to the head of the pancreas, presumably a proteinaceous/hemorrhagic pancreatic pseudocyst. 4. Small right pleural effusion. 5. Additional incidental findings, as above. Electronically Signed   By: Trudie Reed M.D.   On: 03/01/2023 05:35   CT Angio Chest PE W and/or Wo Contrast  Result Date: 02/27/2023 CLINICAL DATA:  Shortness of breath blood clots in leg EXAM: CT ANGIOGRAPHY CHEST WITH CONTRAST TECHNIQUE: Multidetector CT imaging of the chest was performed using the standard protocol during bolus administration of intravenous contrast. Multiplanar CT image reconstructions and MIPs were obtained to evaluate the vascular anatomy. RADIATION DOSE REDUCTION: This exam was performed according to the departmental dose-optimization program which includes automated exposure control, adjustment of the mA and/or kV according to patient size and/or use of iterative reconstruction technique. CONTRAST:  75mL OMNIPAQUE IOHEXOL 350 MG/ML SOLN COMPARISON:  Chest x-ray 02/27/2023 FINDINGS: Cardiovascular: Satisfactory opacification of the pulmonary arteries to the segmental level. Small nonocclusive segmental/subsegmental embolus within the left upper lobe, series 6, image 168. No other discrete  emboli are visualized. Mild atherosclerosis. No aneurysm or dissection. Normal cardiac size. Moderate to large circumferential pericardial effusion measuring up to 2.1 cm, slight increased density values suggesting complex fluid. Mediastinum/Nodes: Midline trachea. No thyroid mass. Subcarinal nodes measuring at least up to 2 cm. Esophagus within normal limits. Small clips in the posterior mediastinum. Lungs/Pleura: Small circumferential right pleural effusion. Calcifications or sutures at the posterior right lung base. Prominent peribronchovascular thickening within the right perihilar region and within the right middle and lower lobe bronchi. Multifocal bilateral ground-glass densities and areas of tree-in-bud density suspicious for respiratory infection Upper Abdomen: Large hypodense right hepatic mass suspected, this measures at least 12.6 cm. Another hypodense inferior right hepatic lobe lesion measuring 2.8 cm on series 4, image 153 incompletely assessed. Musculoskeletal: No acute osseous abnormality. Review of the MIP images confirms the above findings. IMPRESSION: 1. Positive for small nonocclusive segmental/subsegmental pulmonary embolus within the left upper lobe. 2. Moderate to large circumferential pericardial effusion measuring up to 2.1 cm, slight increased density values suggesting complex fluid. Recommend correlation with echocardiogram 3. Small circumferential right pleural effusion. Prominent peribronchovascular thickening within the right perihilar region and within the right upper, middle and lower lobe bronchi, with mild mucous plugging in the right lower lobe. Multifocal bilateral ground-glass densities and areas of tree-in-bud density suspicious for respiratory infection/pneumonia, possible atypical infection. 4. Subcarinal adenopathy, reactive versus metastatic. 5. Right hepatic masses measuring up to 12.6 cm. Findings are suspicious for metastatic disease versus liver carcinoma. When the  patient is clinically stable and able to follow directions and hold their breath (preferably as an outpatient) further evaluation with dedicated abdominal MRI should be considered. Critical  Value/emergent results were called by telephone at the time of interpretation on 02/27/2023 at 11:02 pm to provider [facility] , who verbally acknowledged these results. Electronically Signed   By: Jasmine Pang M.D.   On: 02/27/2023 23:04   DG Chest 2 View  Result Date: 02/27/2023 CLINICAL DATA:  Shortness of breath EXAM: CHEST - 2 VIEW COMPARISON:  08/04/2013 FINDINGS: Left lung is grossly clear. Normal cardiac size. Small loculated pleural effusion versus pleural thickening. Airspace disease at the right middle lobe and right base. No pneumothorax IMPRESSION: Airspace disease at the right middle lobe and right base, possible pneumonia. Small loculated pleural effusion versus pleural thickening. Electronically Signed   By: Jasmine Pang M.D.   On: 02/27/2023 22:45   US Venous Img Lower Bilateral (DVT)  Result Date: 02/26/2023 CLINICAL DATA:  Leg swelling for 2 weeks EXAM: BILATERAL LOWER EXTREMITY VENOUS DOPPLER ULTRASOUND TECHNIQUE: Gray-scale sonography with graded compression, as well as color Doppler and duplex ultrasound were performed to evaluate the lower extremity deep venous systems from the level of the common femoral vein and including the common femoral, femoral, profunda femoral, popliteal and calf veins including the posterior tibial, peroneal and gastrocnemius veins when visible. The superficial great saphenous vein was also interrogated. Spectral Doppler was utilized to evaluate flow at rest and with distal augmentation maneuvers in the common femoral, femoral and popliteal veins. COMPARISON:  None Available. FINDINGS: RIGHT LOWER EXTREMITY Common Femoral Vein: No evidence of thrombus. Normal compressibility, respiratory phasicity and response to augmentation. Saphenofemoral Junction: No  evidence of thrombus. Normal compressibility and flow on color Doppler imaging. Profunda Femoral Vein: No evidence of thrombus. Normal compressibility and flow on color Doppler imaging. Femoral Vein: Poor flow on Doppler. Poor compression with hypoechoic material. Popliteal Vein: Poor flow on Doppler with poor compression and luminal hypoechoic appearance Calf Veins: Areas of thrombus along the posterior tibial and peroneal vein. Superficial Great Saphenous Vein: No evidence of thrombus. Normal compressibility. Venous Reflux:  None. Other Findings:  None. LEFT LOWER EXTREMITY Common Femoral Vein: No evidence of thrombus. Normal compressibility, respiratory phasicity and response to augmentation. Saphenofemoral Junction: No evidence of thrombus. Normal compressibility and flow on color Doppler imaging. Profunda Femoral Vein: No evidence of thrombus. Normal compressibility and flow on color Doppler imaging. Femoral Vein: No evidence of thrombus. Normal compressibility, respiratory phasicity and response to augmentation. Popliteal Vein: Incomplete compression and poor flow on Doppler, color and spectral Calf Veins: Areas of thrombus in the peroneal and posterior tibial veins. Superficial Great Saphenous Vein: No evidence of thrombus. Normal compressibility. Venous Reflux:  None. Other Findings:  None. Critical Value/emergent results were called by telephone at the time of interpretation on 02/26/2023 at 6:04 pm to provider [facility] , who verbally acknowledged these results. IMPRESSION: Bilateral DVT. Right femoral vein down through the calf veins. Left popliteal and calf veins. Electronically Signed   By: Karen Kays M.D.   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  CA 19-9 normal however CEA elevated. S/p cycle 1 of  gemcitabine-abraxane on 03/12/23. Tolerated well overall- rigors.   # Hyperglycemia- glucose 258 today. IV fluids. Likely related to steroids. Monitor.   # LFTs - 2/2 malignancy. AST and ALT have normalized. Alk phos elevated but improved from previous. Clinically, no evidence of obstruction. Bili normal.   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  Status post recent antibiotics.  Monitor closely. WBC 10.5. Afebrile.   # cardio-respiratory: Acute PE/bilateral DVT-currently on Eliquis-stable.  Large pericardial effusion [Dr.End]- no cardiac tamponade; no pericardiocentesis. monitor for now.   # right shoulder pain- on dilaudid 3 times a day. Bone scan pending. Dilaudid refilled.   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 Experiencing hallucinations. He requests to decrease to 12.5 mcg/hr which is reasonable. Continue dilaudid for break through.   # Loss of appetite- Secondary to malignancy. Recommend Zyprexa. S/p nutrition evaluation:     # port placement. Awaiting placement   # Goals of care- established care with palliative care    Cycle #1- gem-ab q 2w; 2nd cyle- qwx3; q4   # DISPOSITION: Fluids today # follow up in 1 week- MD; labs-cbc/cmp; CEA- gem-abraxane- la  No problem-specific Assessment & Plan notes found for this encounter.  Above plan of care was discussed with patient/family in detail.  My contact information was given to the patient/family.    Alinda Dooms, NP 03/20/2023

## 2023-03-21 ENCOUNTER — Other Ambulatory Visit: Payer: Self-pay | Admitting: Physician Assistant

## 2023-03-21 DIAGNOSIS — Z01818 Encounter for other preprocedural examination: Secondary | ICD-10-CM

## 2023-03-21 NOTE — Progress Notes (Signed)
Patient for IR Port Placement on Mon 03/24/2023, I called and spoke with the patient's wife, [name] on the phone and gave pre-procedure instructions. [name] was made aware to have the patient here at 10a, NPO after MN prior to procedure as well as driver post procedure/recovery/discharge. [name] stated understanding.  Called 03/21/2023  Dr Miles Costain stated that the patient can keep his Fentanyl patch on for this procedure.

## 2023-03-24 ENCOUNTER — Inpatient Hospital Stay: Payer: BC Managed Care – PPO

## 2023-03-24 ENCOUNTER — Telehealth: Payer: Self-pay

## 2023-03-24 ENCOUNTER — Other Ambulatory Visit: Payer: Self-pay | Admitting: *Deleted

## 2023-03-24 ENCOUNTER — Encounter: Payer: Self-pay | Admitting: Hospice and Palliative Medicine

## 2023-03-24 ENCOUNTER — Encounter: Payer: Self-pay | Admitting: Radiology

## 2023-03-24 ENCOUNTER — Inpatient Hospital Stay (HOSPITAL_BASED_OUTPATIENT_CLINIC_OR_DEPARTMENT_OTHER): Payer: BC Managed Care – PPO | Admitting: Hospice and Palliative Medicine

## 2023-03-24 ENCOUNTER — Other Ambulatory Visit: Payer: Self-pay

## 2023-03-24 ENCOUNTER — Ambulatory Visit
Admission: RE | Admit: 2023-03-24 | Discharge: 2023-03-24 | Disposition: A | Discharge status: Home / Self Care | Payer: BC Managed Care – PPO | Source: Ambulatory Visit | Attending: Internal Medicine | Admitting: Internal Medicine

## 2023-03-24 VITALS — BP 89/55 | HR 95 | Temp 96.5°F | Wt 150.0 lb

## 2023-03-24 DIAGNOSIS — E860 Dehydration: Secondary | ICD-10-CM

## 2023-03-24 DIAGNOSIS — Z95828 Presence of other vascular implants and grafts: Secondary | ICD-10-CM

## 2023-03-24 DIAGNOSIS — C787 Secondary malignant neoplasm of liver and intrahepatic bile duct: Secondary | ICD-10-CM

## 2023-03-24 DIAGNOSIS — C259 Malignant neoplasm of pancreas, unspecified: Secondary | ICD-10-CM

## 2023-03-24 DIAGNOSIS — Z01818 Encounter for other preprocedural examination: Secondary | ICD-10-CM

## 2023-03-24 DIAGNOSIS — R2243 Localized swelling, mass and lump, lower limb, bilateral: Secondary | ICD-10-CM | POA: Diagnosis not present

## 2023-03-24 DIAGNOSIS — Z5111 Encounter for antineoplastic chemotherapy: Secondary | ICD-10-CM | POA: Diagnosis not present

## 2023-03-24 HISTORY — DX: Hyperlipidemia, unspecified: E78.5

## 2023-03-24 HISTORY — PX: IR IMAGING GUIDED PORT INSERTION: IMG5740

## 2023-03-24 LAB — CBC WITH DIFFERENTIAL (CANCER CENTER ONLY)
Abs Immature Granulocytes: 0.17 10*3/uL — ABNORMAL HIGH (ref 0.00–0.07)
Basophils Absolute: 0 10*3/uL (ref 0.0–0.1)
Basophils Relative: 0 %
Eosinophils Absolute: 0.3 10*3/uL (ref 0.0–0.5)
Eosinophils Relative: 3 %
HCT: 30.7 % — ABNORMAL LOW (ref 39.0–52.0)
Hemoglobin: 9 g/dL — ABNORMAL LOW (ref 13.0–17.0)
Immature Granulocytes: 1 %
Lymphocytes Relative: 3 %
Lymphs Abs: 0.4 10*3/uL — ABNORMAL LOW (ref 0.7–4.0)
MCH: 26 pg (ref 26.0–34.0)
MCHC: 29.3 g/dL — ABNORMAL LOW (ref 30.0–36.0)
MCV: 88.7 fL (ref 80.0–100.0)
Monocytes Absolute: 1.6 10*3/uL — ABNORMAL HIGH (ref 0.1–1.0)
Monocytes Relative: 14 %
Neutro Abs: 9.4 10*3/uL — ABNORMAL HIGH (ref 1.7–7.7)
Neutrophils Relative %: 79 %
Platelet Count: 547 10*3/uL — ABNORMAL HIGH (ref 150–400)
RBC: 3.46 MIL/uL — ABNORMAL LOW (ref 4.22–5.81)
RDW: 16.6 % — ABNORMAL HIGH (ref 11.5–15.5)
WBC Count: 11.9 10*3/uL — ABNORMAL HIGH (ref 4.0–10.5)
nRBC: 0 % (ref 0.0–0.2)

## 2023-03-24 LAB — CMP (CANCER CENTER ONLY)
ALT: 23 U/L (ref 0–44)
AST: 25 U/L (ref 15–41)
Albumin: 2.3 g/dL — ABNORMAL LOW (ref 3.5–5.0)
Alkaline Phosphatase: 666 U/L — ABNORMAL HIGH (ref 38–126)
Anion gap: 9 (ref 5–15)
BUN: 26 mg/dL — ABNORMAL HIGH (ref 8–23)
CO2: 25 mmol/L (ref 22–32)
Calcium: 10.1 mg/dL (ref 8.9–10.3)
Chloride: 103 mmol/L (ref 98–111)
Creatinine: 0.71 mg/dL (ref 0.61–1.24)
GFR, Estimated: >60 mL/min (ref >60)
Glucose, Bld: 285 mg/dL — ABNORMAL HIGH (ref 70–99)
Potassium: 4.7 mmol/L (ref 3.5–5.1)
Sodium: 137 mmol/L (ref 135–145)
Total Bilirubin: 1.2 mg/dL — ABNORMAL HIGH (ref <1.2)
Total Protein: 6.8 g/dL (ref 6.5–8.1)

## 2023-03-24 LAB — GLUCOSE, CAPILLARY: Glucose-Capillary: 280 mg/dL — ABNORMAL HIGH (ref 70–99)

## 2023-03-24 MED ORDER — HEPARIN SOD (PORK) LOCK FLUSH 100 UNIT/ML IV SOLN
500.0000 [IU] | Freq: Once | INTRAVENOUS | Status: AC
  Administered 2023-03-24: 500 [IU] via INTRAVENOUS

## 2023-03-24 MED ORDER — HYDROMORPHONE HCL 4 MG PO TABS
4.0000 mg | ORAL_TABLET | Freq: Once | ORAL | Status: DC
  Filled 2023-03-24: qty 1

## 2023-03-24 MED ORDER — SODIUM CHLORIDE 0.9 % IV SOLN
INTRAVENOUS | Status: DC
  Filled 2023-03-24 (×2): qty 250

## 2023-03-24 MED ORDER — MIDAZOLAM HCL 5 MG/5ML IJ SOLN
INTRAMUSCULAR | Status: AC | PRN
  Administered 2023-03-24: .5 mg via INTRAVENOUS

## 2023-03-24 MED ORDER — FENTANYL CITRATE (PF) 100 MCG/2ML IJ SOLN
INTRAMUSCULAR | Status: AC | PRN
  Administered 2023-03-24: 25 ug via INTRAVENOUS

## 2023-03-24 MED ORDER — LIDOCAINE-EPINEPHRINE 1 %-1:100000 IJ SOLN
15.0000 mL | Freq: Once | INTRAMUSCULAR | Status: AC
  Administered 2023-03-24: 15 mL via INTRADERMAL

## 2023-03-24 MED ORDER — HEPARIN SOD (PORK) LOCK FLUSH 100 UNIT/ML IV SOLN
INTRAVENOUS | Status: AC
  Filled 2023-03-24: qty 5

## 2023-03-24 MED ORDER — HEPARIN SOD (PORK) LOCK FLUSH 100 UNIT/ML IV SOLN
500.0000 [IU] | Freq: Once | INTRAVENOUS | Status: AC
  Administered 2023-03-24: 500 [IU]
  Filled 2023-03-24: qty 5

## 2023-03-24 MED ORDER — MIDAZOLAM HCL 2 MG/2ML IJ SOLN
INTRAMUSCULAR | Status: AC
  Filled 2023-03-24: qty 2

## 2023-03-24 MED ORDER — SODIUM CHLORIDE 0.9% FLUSH
10.0000 mL | Freq: Once | INTRAVENOUS | Status: AC
Start: 2023-03-24 — End: 2023-03-24
  Administered 2023-03-24: 10 mL via INTRAVENOUS
  Filled 2023-03-24: qty 10

## 2023-03-24 MED ORDER — LIDOCAINE-EPINEPHRINE 1 %-1:100000 IJ SOLN
INTRAMUSCULAR | Status: AC
  Filled 2023-03-24: qty 1

## 2023-03-24 MED ORDER — FENTANYL CITRATE (PF) 100 MCG/2ML IJ SOLN
INTRAMUSCULAR | Status: AC
  Filled 2023-03-24: qty 2

## 2023-03-24 NOTE — H&P (Signed)
Chief Complaint: Patient was seen in consultation today for port-a-catheter placement.   Referring Physician(s): Earna Coder  Supervising Physician: Ruel Favors  Patient Status: [facility] - Out-pt  History of Present Illness: Tony Benson is a 62 y.o. male with a medical history significant for asthma, HTN, sleep apnea, DM and prostate cancer. He was recently hospitalized November 2024 with shortness of breath, acute PE and bilateral DVT. CT of the abdomen/pelvis showed a pancreatic head mass with liver masses. 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 He has been started on palliative chemotherapy and he will need durable venous access.  Interventional Radiology has been asked to evaluate this patient for an image-guided port-a-catheter placement to facilitate his treatment goals.   Past Medical History:  Diagnosis Date   Asthma    Cancer (HCC)    prostate   Diabetes mellitus without complication (HCC)    type II   Hyperlipidemia    Hypertension    Sleep apnea    uses Cpap machine     Past Surgical History:  Procedure Laterality Date   CHOLECYSTECTOMY     COLONOSCOPY WITH PROPOFOL N/A 11/18/2018   Procedure: COLONOSCOPY WITH PROPOFOL;  Surgeon: Pasty Spillers, MD;  Location: [facility] ENDOSCOPY;  Service: Gastroenterology;  Laterality: N/A;   COLONOSCOPY WITH PROPOFOL N/A 02/05/2023   Procedure: COLONOSCOPY WITH PROPOFOL;  Surgeon: Toney Reil, MD;  Location: [facility] ENDOSCOPY;  Service: Gastroenterology;  Laterality: N/A;   IR US LIVER BIOPSY  03/03/2023   LAPAROSCOPIC RETROPUBIC PROSTATECTOMY     2023   LUNG SURGERY     "ligation of thoracic duct"   PROSTATE BIOPSY N/A 09/21/2020   Procedure: PROSTATE BIOPSY [name];  Surgeon: [name], MD;  Location: [facility] ORS;  Service: Urology;  Laterality: N/A;    Allergies: Other, Morphine and codeine, Tree extract, and Wound  dressing adhesive  Medications: Prior to Admission medications   Medication Sig Start Date End Date Taking? Authorizing Provider  acetaminophen (TYLENOL) 500 MG tablet Take 500 mg by mouth every 6 (six) hours as needed.   Yes [provider]  amLODipine (NORVASC) 10 MG tablet Take 10 mg by mouth daily. 09/10/17  Yes [provider]  apixaban (ELIQUIS) 5 MG TABS tablet Take 2 tablets (10 mg total) by mouth 2 (two) times daily for 5 days, THEN 1 tablet (5 mg total) 2 (two) times daily for 25 days. 03/07/23 04/06/23 Yes Wieting, Richard, MD  atorvastatin (LIPITOR) 10 MG tablet Take 10 mg by mouth daily.   Yes [provider]  feeding supplement (ENSURE ENLIVE / ENSURE PLUS) LIQD Take 237 mLs by mouth 2 (two) times daily between meals. 03/07/23  Yes Wieting, Richard, MD  insulin aspart (NOVOLOG) 100 UNIT/ML FlexPen Inject 5 Units into the skin 3 (three) times daily with meals. If eating and Blood Glucose (BG) 80 or higher inject 5 units for meal coverage and add correction dose per scale. If not eating, correction dose only. BG <150= 0 unit; BG 150-200= 1 unit; BG 201-250= 2 unit; BG 251-300= 3 unit; BG 301-350= 4 unit; BG 351-400= 5 unit; BG >400= 6 unit and Call Primary care. 03/07/23  Yes Wieting, Richard, MD  JARDIANCE 25 MG TABS tablet Take 25 mg by mouth daily. 12/23/22  Yes [provider]  losartan (COZAAR) 100 MG tablet Take 100 mg by mouth daily. 08/15/17  Yes [provider]  metoprolol tartrate (LOPRESSOR) 50  MG tablet Take 1 tablet (50 mg total) by mouth 2 (two) times daily. 03/07/23  Yes Wieting, Richard, MD  ALPRAZolam Prudy Feeler) 0.5 MG tablet Take 1 tablet (0.5 mg total) by mouth 2 (two) times daily as needed for anxiety. 03/07/23   Alford Highland, MD  fentaNYL (DURAGESIC) 12 MCG/HR Place 1 patch onto the skin every 3 (three) days. 03/20/23   Alinda Dooms, NP  HYDROmorphone (DILAUDID) 4 MG tablet Take 1 tablet (4 mg total) by mouth every 4 (four)  hours as needed for severe pain (pain score 7-10). 03/20/23   Alinda Dooms, NP  lidocaine-prilocaine (EMLA) cream Apply on the port. 30 -45 min  prior to port access. 03/12/23   Earna Coder, MD  mometasone-formoterol (DULERA) 100-5 MCG/ACT AERO Inhale 2 puffs into the lungs 2 (two) times daily as needed for wheezing or shortness of breath.    [provider]  naloxone [facility]) nasal spray 4 mg/0.1 mL One spray in nostril for unresponsiveness 03/07/23   Alford Highland, MD  Omega-3 Fatty Acids (FISH OIL) 1000 MG CAPS Take 1,000 mg by mouth daily.    [provider]  ondansetron (ZOFRAN) 8 MG tablet One pill every 8 hours as needed for nausea/vomitting. 03/12/23   Earna Coder, MD  prochlorperazine (COMPAZINE) 10 MG tablet Take 1 tablet (10 mg total) by mouth every 6 (six) hours as needed for nausea or vomiting. 03/12/23   Earna Coder, MD     Family History  Problem Relation Age of Onset   Allergies Sister     Social History   Socioeconomic History   Marital status: Married    Spouse name: Not on file   Number of children: Not on file   Years of education: Not on file   Highest education level: Not on file  Occupational History   Occupation: Automotive engineer   Tobacco Use   Smoking status: Never   Smokeless tobacco: Never  Vaping Use   Vaping status: Never Used  Substance and Sexual Activity   Alcohol use: No   Drug use: No   Sexual activity: Not on file  Other Topics Concern   Not on file  Social History Narrative   Not on file   Social Determinants of Health   Financial Resource Strain: Not on file  Food Insecurity: No Food Insecurity (02/28/2023)   Hunger Vital Sign    Worried About Running Out of Food in the Last Year: Never true    Ran Out of Food in the Last Year: Never true  Transportation Needs: No Transportation Needs (02/28/2023)   PRAPARE - Administrator, Civil Service (Medical): No    Lack of  Transportation (Non-Medical): No  Physical Activity: Not on file  Stress: Not on file  Social Connections: Not on file    Review of Systems: A 12 point ROS discussed and pertinent positives are indicated in the HPI above.  All other systems are negative.  Review of Systems  Constitutional:  Positive for appetite change and fatigue.  Respiratory: Negative.    Cardiovascular: Negative.   Gastrointestinal: Negative.   Musculoskeletal: Negative.   Neurological: Negative.     Vital Signs: BP (!) 87/61   Pulse 87   Temp 98.1 F (36.7 C) (Oral)   Resp 20   Ht 5\' 10"  (1.778 m)   Wt 151 lb (68.5 kg)   SpO2 97%   BMI 21.67 kg/m   Physical Exam Constitutional:  General: He is not in acute distress. Cardiovascular:     Pulses: Normal pulses.  Pulmonary:     Effort: Pulmonary effort is normal.  Abdominal:     Tenderness: There is no abdominal tenderness.  Skin:    General: Skin is warm and dry.  Neurological:     Mental Status: He is oriented to person, place, and time. He is lethargic.     Imaging: DG Chest Port 1 View  Result Date: 03/05/2023 CLINICAL DATA:  Shortness of breath.  Follow-up exam. EXAM: PORTABLE CHEST 1 VIEW COMPARISON:  Chest radiograph dated 02/27/2023. FINDINGS: Small right pleural effusion. Right lung base opacity as well as faint clusters of ground-glass density involving the right mid to lower lung field consistent with pneumonia. Overall interval worsening of pulmonary infiltrate since the radiograph of 02/27/2023. The left lung is clear. No pneumothorax. Stable cardiac silhouette. No acute osseous pathology. IMPRESSION: Interval worsening of right lung pneumonia. Electronically Signed   By: Elgie Collard M.D.   On: 03/05/2023 23:52   ECHOCARDIOGRAM LIMITED  Result Date: 03/04/2023    ECHOCARDIOGRAM LIMITED REPORT   Patient Name:   Tony Benson Date of Exam: 03/04/2023 Medical Rec #:  [id number]     Height:       69.0 in Accession #:     [id number]    Weight:       148.8 lb Date of Birth:  01-15-1961    BSA:          1.822 m Patient Age:    61 years      BP:           117/60 mmHg Patient Gender: M             HR:           112 bpm. Exam Location:  [facility] Procedure: Limited Echo, Color Doppler and Cardiac Doppler Indications:     Pericardial Effusion I31.3  History:         Patient has prior history of Echocardiogram examinations, most                  recent 03/01/2023. Risk Factors:Diabetes, Hypertension and                  Sleep Apnea.  Sonographer:     Cristela Blue Referring Phys:  [id number] Marrion Coy Diagnosing Phys: Yvonne Kendall MD IMPRESSIONS  1. Left ventricular ejection fraction, by estimation, is >55%. The left ventricle has normal function. The left ventricle has no regional wall motion abnormalities.  2. Right ventricular systolic function is normal. The right ventricular size is normal.  3. Moderate pericardial effusion. The pericardial effusion is circumferential. There is no evidence of cardiac tamponade.  4. The mitral valve is normal in structure. Comparison(s): A prior study was performed on 03/01/2023. Pericardial effusion appears slightly smaller. FINDINGS  Left Ventricle: Left ventricular ejection fraction, by estimation, is >55%. The left ventricle has normal function. The left ventricle has no regional wall motion abnormalities. The left ventricular internal cavity size was normal in size. There is borderline left ventricular hypertrophy. Right Ventricle: The right ventricular size is normal. No increase in right ventricular wall thickness. Right ventricular systolic function is normal. Pericardium: A moderately sized pericardial effusion is present. The pericardial effusion is circumferential. There is no evidence of cardiac tamponade. Mitral Valve: The mitral valve is normal in structure. Tricuspid Valve: The tricuspid valve is grossly normal. Tricuspid valve regurgitation is mild. Aorta: The aortic  root is normal in size  and structure. Venous: The inferior vena cava was not well visualized. Additional Comments: Spectral Doppler performed. Color Doppler performed.  LEFT VENTRICLE PLAX 2D LVIDd:         4.30 cm LVIDs:         2.30 cm LV PW:         1.12 cm LV IVS:        0.95 cm   AORTA Ao Root diam: 3.30 cm Yvonne Kendall MD Electronically signed by Yvonne Kendall MD Signature Date/Time: 03/04/2023/11:43:49 AM    Final    IR US LIVER BIOPSY  Result Date: 03/03/2023 INDICATION: 62 year old male with pancreatic mass and concern for hepatic metastatic disease. EXAM: LIVER CORE BIOPSY MEDICATIONS: None. ANESTHESIA/SEDATION: Moderate (conscious) sedation was employed during this procedure. A total of Versed 1 mg and Fentanyl 50 mcg was administered intravenously. Moderate Sedation Time: 10 minutes. The patient's level of consciousness and vital signs were monitored continuously by radiology nursing throughout the procedure under my direct supervision. FLUOROSCOPY TIME:  None. COMPLICATIONS: None immediate. PROCEDURE: Informed written consent was obtained from the patient after a thorough discussion of the procedural risks, benefits and alternatives. All questions were addressed. Maximal Sterile Barrier Technique was utilized including caps, mask, sterile gowns, sterile gloves, sterile drape, hand hygiene and skin antiseptic. A timeout was performed prior to the initiation of the procedure. The right upper quadrant was interrogated with ultrasound. Multiple echogenic masses present throughout the liver. A suitable skin entry site was selected and marked. The region was sterilely prepped and draped in the standard fashion using chlorhexidine skin prep. Local anesthesia was attained by infiltration with 1% lidocaine. A small dermatotomy was made. Under real-time sonographic guidance, a 17 gauge introducer needle was advanced into the margin of the mass. Multiple 18 gauge biopsies were then coaxially obtained using the Bio[employer]  automated biopsy device. Biopsy samples were placed in formalin and delivered to pathology for further analysis. As a 17 gauge introducer needle was removed, the biopsy tract was embolized with a Gel-Foam slurry. Post biopsy ultrasound imaging demonstrates no evidence of active hemorrhage or perihepatic hematoma. The patient tolerated the procedure well. IMPRESSION: Technically successful ultrasound-guided core biopsy of hepatic lesion. Electronically Signed   By: Malachy Moan M.D.   On: 03/03/2023 14:59   ECHOCARDIOGRAM COMPLETE  Result Date: 03/01/2023    ECHOCARDIOGRAM REPORT   Patient Name:   Tony Benson Date of Exam: 03/01/2023 Medical Rec #:  [id number]     Height:       69.0 in Accession #:    [id number]    Weight:       149.9 lb Date of Birth:  07-14-1960    BSA:          1.828 m Patient Age:    61 years      BP:           138/75 mmHg Patient Gender: M             HR:           116 bpm. Exam Location:  [facility] Procedure: 2D Echo Indications:     Pulmonary Embolus I26.09  History:         Patient has no prior history of Echocardiogram examinations.  Sonographer:     Overton Mam RDCS, FASE Referring Phys:  [id number] Vernetta Honey MANSY Diagnosing Phys: Julien Nordmann MD IMPRESSIONS  1. Left ventricular ejection fraction, by estimation, is 60 to 65%. The  left ventricle has normal function. The left ventricle has no regional wall motion abnormalities. Left ventricular diastolic parameters are indeterminate.  2. Right ventricular systolic function is normal. The right ventricular size is normal. There is moderately elevated pulmonary artery systolic pressure. The estimated right ventricular systolic pressure is 54.0 mmHg.  3. Large pericardial effusion. The pericardial effusion is circumferential. There is no evidence of cardiac tamponade. 1.95 off the RV free wall, 2.16 cm off the apical region, 1.70 cm off the LV free wall. IVC is not dilated at 1.16 cm  4. The mitral valve is normal in structure. No  evidence of mitral valve regurgitation. No evidence of mitral stenosis.  5. The aortic valve is tricuspid. Aortic valve regurgitation is not visualized. No aortic stenosis is present.  6. The inferior vena cava is normal in size with greater than 50% respiratory variability, suggesting right atrial pressure of 3 mmHg. FINDINGS  Left Ventricle: Left ventricular ejection fraction, by estimation, is 60 to 65%. The left ventricle has normal function. The left ventricle has no regional wall motion abnormalities. The left ventricular internal cavity size was normal in size. There is  no left ventricular hypertrophy. Left ventricular diastolic parameters are indeterminate. Right Ventricle: The right ventricular size is normal. No increase in right ventricular wall thickness. Right ventricular systolic function is normal. There is moderately elevated pulmonary artery systolic pressure. The tricuspid regurgitant velocity is 3.50 m/s, and with an assumed right atrial pressure of 5 mmHg, the estimated right ventricular systolic pressure is 54.0 mmHg. Left Atrium: Left atrial size was normal in size. Right Atrium: Right atrial size was normal in size. Pericardium: A large pericardial effusion is present. The pericardial effusion is circumferential. There is no evidence of cardiac tamponade. Mitral Valve: The mitral valve is normal in structure. No evidence of mitral valve regurgitation. No evidence of mitral valve stenosis. Tricuspid Valve: The tricuspid valve is normal in structure. Tricuspid valve regurgitation is mild . No evidence of tricuspid stenosis. Aortic Valve: The aortic valve is tricuspid. Aortic valve regurgitation is not visualized. No aortic stenosis is present. Aortic valve peak gradient measures 12.0 mmHg. Pulmonic Valve: The pulmonic valve was normal in structure. Pulmonic valve regurgitation is not visualized. No evidence of pulmonic stenosis. Aorta: The aortic root is normal in size and structure. Venous:  The inferior vena cava is normal in size with greater than 50% respiratory variability, suggesting right atrial pressure of 3 mmHg. IAS/Shunts: No atrial level shunt detected by color flow Doppler.  LEFT VENTRICLE PLAX 2D LVIDd:         4.60 cm   Diastology LVIDs:         2.90 cm   LV e' medial:    11.70 cm/s LV PW:         1.00 cm   LV E/e' medial:  8.8 LV IVS:        1.00 cm   LV e' lateral:   18.60 cm/s LVOT diam:     2.10 cm   LV E/e' lateral: 5.5 LV SV:         67 LV SV Index:   37 LVOT Area:     3.46 cm  RIGHT VENTRICLE RV Basal diam:  2.50 cm RV S prime:     22.30 cm/s TAPSE (M-mode): 1.8 cm LEFT ATRIUM             Index        RIGHT ATRIUM  Index LA diam:        2.70 cm 1.48 cm/m   RA Area:     7.05 cm LA Vol (A2C):   45.8 ml 25.06 ml/m  RA Volume:   9.68 ml  5.30 ml/m LA Vol (A4C):   25.5 ml 13.95 ml/m LA Biplane Vol: 35.4 ml 19.37 ml/m  AORTIC VALVE                 PULMONIC VALVE AV Area (Vmax): 2.46 cm     PV Vmax:        1.10 m/s AV Vmax:        173.00 cm/s  PV Peak grad:   4.8 mmHg AV Peak Grad:   12.0 mmHg    RVOT Peak grad: 4 mmHg LVOT Vmax:      123.00 cm/s LVOT Vmean:     79.500 cm/s LVOT VTI:       0.193 m  AORTA                        PULMONARY ARTERY Ao Root diam: 3.50 cm        MPA diam:        3.00 cm Ao Asc diam:  2.70 cm MITRAL VALVE                TRICUSPID VALVE MV Area (PHT): 7.22 cm     TR Peak grad:   49.0 mmHg MV Decel Time: 105 msec     TR Vmax:        350.00 cm/s MV E velocity: 103.00 cm/s MV A velocity: 87.50 cm/s   SHUNTS MV E/A ratio:  1.18         Systemic VTI:  0.19 m                             Systemic Diam: 2.10 cm Julien Nordmann MD Electronically signed by Julien Nordmann MD Signature Date/Time: 03/01/2023/3:02:54 PM    Final    MR LIVER W WO CONTRAST  Result Date: 03/01/2023 CLINICAL DATA:  62 year old male with history of shortness of breath recently diagnosed with blood clots in the legs. Hepatic masses identified on recent chest CTA. Further  evaluation to exclude the possibility of malignancy. EXAM: MRI ABDOMEN WITHOUT AND WITH CONTRAST TECHNIQUE: Multiplanar multisequence MR imaging of the abdomen was performed both before and after the administration of intravenous contrast. CONTRAST:  7mL GADAVIST GADOBUTROL 1 MMOL/ML IV SOLN COMPARISON:  No prior abdominal MRI.  Chest CTA 02/27/2023. FINDINGS: Comment: Portions of today's examination are limited by considerable patient respiratory motion. Lower chest: Small right pleural effusion with areas of atelectasis and/or scarring in the right lung base. Hepatobiliary: There are multiple hepatic lesions highly concerning for widespread metastatic disease to the liver. The largest of these is a dominant lesion in the central aspect of the liver, predominantly centered in segment 8 (axial image 15 of series 4 and coronal image 22 series 3) measuring up to 13.7 x 9.6 x 12.1 cm. These lesions are heterogeneous in signal intensity but generally T1 hypointense, slightly T2 hyperintense, hypovascular with predominantly peripheral enhancement which increases slightly on delayed images, and extensive internal diffusion restriction. No definite hypervascular hepatic lesion is identified. Gallbladder is not visualized, likely surgically absent. No intra or extrahepatic biliary ductal dilatation. Pancreas: In the region of the pancreatic head (best appreciated on axial image 55 of series 14 and coronal  image 45 of series 20) there is what appears to be a poorly defined hypovascular mass estimated to measure approximately 4.0 x 2.6 x 2.9 cm, which is located immediately anterior and lateral to the superior mesenteric vein adjacent to the splenic pulmonary confluence, and inferior to the proximal portal vein, with minimal obscuration of the intervening fat plane adjacent to the superior mesenteric vein. This lesion appears to obstruct the main pancreatic duct (axial image 49 of series 14), proximal to which the main  pancreatic duct is severely dilated measuring up to 12 mm in the body of the pancreas. Diffuse atrophy throughout the body and tail of the pancreas is noted. 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 Spleen:  Unremarkable. Adrenals/Urinary Tract: Multiple T1 hypointense, T2 hyperintense, nonenhancing lesions in the kidneys are compatible with simple cysts (Bosniak class 1), which require no imaging follow-up, largest of which measure up to approximately 2.2 cm in diameter in the lower pole of the left kidney. No aggressive appearing renal lesions. No hydroureteronephrosis in the visualized portions of the abdomen. Bilateral adrenal glands are normal in appearance. Stomach/Bowel: Visualized portions are unremarkable. Vascular/Lymphatic: No aneurysm identified in the visualized abdominal vasculature. No definite lymphadenopathy confidently identified in the abdomen. Other:  Trace volume of perihepatic ascites. Musculoskeletal: No aggressive appearing osseous lesions are noted in the visualized portions of the skeleton. IMPRESSION: 1. Large hypovascular mass in the region of the head of the pancreas causing occlusion of the main pancreatic duct, highly concerning for primary pancreatic neoplasm. Numerous hypovascular lesions scattered throughout the liver, presumably reflective of widespread metastatic disease to the liver, with the largest lesion a dominant mass centered in the right lobe of the liver measuring up to 13.7 x 9.6 x 12.1 cm. 2. Trace volume of perihepatic ascites. 3. Fluid collection anterior to the mass adjacent to the head of the pancreas, presumably a proteinaceous/hemorrhagic pancreatic pseudocyst. 4. Small right pleural effusion. 5.  Additional incidental findings, as above. Electronically Signed   By: Trudie Reed M.D.   On: 03/01/2023 05:35   CT Angio Chest PE W and/or Wo Contrast  Result Date: 02/27/2023 CLINICAL DATA:  Shortness of breath blood clots in leg EXAM: CT ANGIOGRAPHY CHEST WITH CONTRAST TECHNIQUE: Multidetector CT imaging of the chest was performed using the standard protocol during bolus administration of intravenous contrast. Multiplanar CT image reconstructions and MIPs were obtained to evaluate the vascular anatomy. RADIATION DOSE REDUCTION: This exam was performed according to the departmental dose-optimization program which includes automated exposure control, adjustment of the mA and/or kV according to patient size and/or use of iterative reconstruction technique. CONTRAST:  75mL OMNIPAQUE IOHEXOL 350 MG/ML SOLN COMPARISON:  Chest x-ray 02/27/2023 FINDINGS: Cardiovascular: Satisfactory opacification of the pulmonary arteries to the segmental level. Small nonocclusive segmental/subsegmental embolus within the left upper lobe, series 6, image 168. No other discrete emboli are visualized. Mild atherosclerosis. No aneurysm or dissection. Normal cardiac size. Moderate to large circumferential pericardial effusion measuring up to 2.1 cm, slight increased density values suggesting complex fluid. Mediastinum/Nodes: Midline trachea. No thyroid mass. Subcarinal nodes measuring at least up to 2 cm. Esophagus within normal limits. Small clips in the posterior mediastinum. Lungs/Pleura: Small circumferential right pleural effusion. Calcifications or sutures at the posterior right lung base. Prominent peribronchovascular thickening within the  right perihilar region and within the right middle and lower lobe bronchi. Multifocal bilateral ground-glass densities and areas of tree-in-bud density suspicious for respiratory infection Upper Abdomen: Large hypodense right hepatic mass suspected, this measures at least 12.6 cm. Another  hypodense inferior right hepatic lobe lesion measuring 2.8 cm on series 4, image 153 incompletely assessed. Musculoskeletal: No acute osseous abnormality. Review of the MIP images confirms the above findings. IMPRESSION: 1. Positive for small nonocclusive segmental/subsegmental pulmonary embolus within the left upper lobe. 2. Moderate to large circumferential pericardial effusion measuring up to 2.1 cm, slight increased density values suggesting complex fluid. Recommend correlation with echocardiogram 3. Small circumferential right pleural effusion. Prominent peribronchovascular thickening within the right perihilar region and within the right upper, middle and lower lobe bronchi, with mild mucous plugging in the right lower lobe. Multifocal bilateral ground-glass densities and areas of tree-in-bud density suspicious for respiratory infection/pneumonia, possible atypical infection. 4. Subcarinal adenopathy, reactive versus metastatic. 5. Right hepatic masses measuring up to 12.6 cm. Findings are suspicious for metastatic disease versus liver carcinoma. When the patient is clinically stable and able to follow directions and hold their breath (preferably as an outpatient) further evaluation with dedicated abdominal MRI should be considered. Critical Value/emergent results were called by telephone at the time of interpretation on 02/27/2023 at 11:02 pm to provider [facility] , who verbally acknowledged these results. Electronically Signed   By: Jasmine Pang M.D.   On: 02/27/2023 23:04   DG Chest 2 View  Result Date: 02/27/2023 CLINICAL DATA:  Shortness of breath EXAM: CHEST - 2 VIEW COMPARISON:  08/04/2013 FINDINGS: Left lung is grossly clear. Normal cardiac size. Small loculated pleural effusion versus pleural thickening. Airspace disease at the right middle lobe and right base. No pneumothorax IMPRESSION: Airspace disease at the right middle lobe and right base, possible pneumonia. Small loculated  pleural effusion versus pleural thickening. Electronically Signed   By: Jasmine Pang M.D.   On: 02/27/2023 22:45   US Venous Img Lower Bilateral (DVT)  Result Date: 02/26/2023 CLINICAL DATA:  Leg swelling for 2 weeks EXAM: BILATERAL LOWER EXTREMITY VENOUS DOPPLER ULTRASOUND TECHNIQUE: Gray-scale sonography with graded compression, as well as color Doppler and duplex ultrasound were performed to evaluate the lower extremity deep venous systems from the level of the common femoral vein and including the common femoral, femoral, profunda femoral, popliteal and calf veins including the posterior tibial, peroneal and gastrocnemius veins when visible. The superficial great saphenous vein was also interrogated. Spectral Doppler was utilized to evaluate flow at rest and with distal augmentation maneuvers in the common femoral, femoral and popliteal veins. COMPARISON:  None Available. FINDINGS: RIGHT LOWER EXTREMITY Common Femoral Vein: No evidence of thrombus. Normal compressibility, respiratory phasicity and response to augmentation. Saphenofemoral Junction: No evidence of thrombus. Normal compressibility and flow on color Doppler imaging. Profunda Femoral Vein: No evidence of thrombus. Normal compressibility and flow on color Doppler imaging. Femoral Vein: Poor flow on Doppler. Poor compression with hypoechoic material. Popliteal Vein: Poor flow on Doppler with poor compression and luminal hypoechoic appearance Calf Veins: Areas of thrombus along the posterior tibial and peroneal vein. Superficial Great Saphenous Vein: No evidence of thrombus. Normal compressibility. Venous Reflux:  None. Other Findings:  None. LEFT LOWER EXTREMITY Common Femoral Vein: No evidence of thrombus. Normal compressibility, respiratory phasicity and response to augmentation. Saphenofemoral Junction: No evidence of thrombus. Normal compressibility and flow on color Doppler imaging. Profunda Femoral Vein: No evidence of thrombus. Normal  compressibility and flow on color  Doppler imaging. Femoral Vein: No evidence of thrombus. Normal compressibility, respiratory phasicity and response to augmentation. Popliteal Vein: Incomplete compression and poor flow on Doppler, color and spectral Calf Veins: Areas of thrombus in the peroneal and posterior tibial veins. Superficial Great Saphenous Vein: No evidence of thrombus. Normal compressibility. Venous Reflux:  None. Other Findings:  None. Critical Value/emergent results were called by telephone at the time of interpretation on 02/26/2023 at 6:04 pm to provider [facility] , who verbally acknowledged these results. IMPRESSION: Bilateral DVT. Right femoral vein down through the calf veins. Left popliteal and calf veins. Electronically Signed   By: Karen Kays M.D.   On: 02/26/2023 18:14    Labs:  CBC: Recent Labs    03/06/23 0619 03/07/23 0248 03/12/23 0846 03/20/23 1349  WBC 20.3* 21.7* 25.0* 10.5  HGB 9.3* 9.1* 8.9* 8.4*  HCT 30.4* 29.5* 29.8* 28.1*  PLT 471* 509* 592* 313    COAGS: Recent Labs    02/28/23 0058 02/28/23 1228 03/01/23 1305 03/01/23 1943 03/02/23 0235 03/03/23 0555  INR 2.5*  --   --   --   --  1.3*  APTT 74*   < > 70* 70* 75* 65*   < > = values in this interval not displayed.    BMP: Recent Labs    03/05/23 0456 03/06/23 0619 03/12/23 0846 03/20/23 1349  NA 135 137 136 137  K 4.1 4.4 4.6 4.4  CL 108 102 101 105  CO2 23 27 26 24   GLUCOSE 173* 145* 224* 258*  BUN 17 16 20  40*  CALCIUM 8.4* 9.2 10.3 9.7  CREATININE 0.63 0.67 0.73 0.71  GFRNONAA >60 >60 >60 >60    LIVER FUNCTION TESTS: Recent Labs    03/06/23 0619 03/07/23 0248 03/12/23 0846 03/20/23 1349  BILITOT 0.7 0.5 0.6 0.5  AST 70* 31 84* 34  ALT 48* 37 77* 27  ALKPHOS 534* 463* 1,195* 778*  PROT 6.4* 6.5 7.4 6.7  ALBUMIN 2.0* 2.0* 2.3* 2.1*    TUMOR MARKERS: No results for input(s): "AFPTM", "CEA", "CA199", "CHROMGRNA" in the last 8760 hours.  Assessment and  Plan:  Pancreatic cancer with metastases; palliative chemotherapy: Tony Benson, 62 year old male, presents today to the [facility] Interventional Radiology Department for an image-guided port-a-catheter placement.  Risks and benefits of image-guided [ort-a-catheter placement were discussed with the patient including, but not limited to bleeding, infection, pneumothorax, or fibrin sheath development and need for additional procedures.  All of the patient's questions were answered, patient is agreeable to proceed. He has been NPO. He is a full code.   Consent signed and in chart.  Thank you for this interesting consult.  I greatly enjoyed meeting KELLUM ADONA and look forward to participating in their care.  A copy of this report was sent to the requesting provider on this date.  Electronically Signed: Alwyn Ren, AGACNP-BC [phone] 03/24/2023, 10:34 AM   I spent a total of 20 Minutes    in face to face in clinical consultation, greater than 50% of which was counseling/coordinating care for port-a-catheter placement.

## 2023-03-24 NOTE — Clinical Note (Incomplete)
Patient clinically stable post IR Port placement per Dr Miles Costain, tolerated well. Vitals stable pre and post procedure. Patient appeared to doze most of entire procedure. Denied complaints immediatedly post procedure. Received Versed 0.5 mg along with Fentanyl 25 mcg IV for procedure. Update given to daughter post procedure. Report given to Alger Simons  RN post procedure/specials/24

## 2023-03-24 NOTE — Progress Notes (Signed)
[facility] at [facility] Telephone:([phone]) [phone] [phone]  Patient Care Team: Sherrie Mustache, MD as PCP - General (Internal Medicine) Luretha Murphy, MD as Consulting Physician (General Surgery) Earna Coder, MD as Consulting Physician (Oncology) Benita Gutter, RN as Oncology Nurse Navigator   NAME OF PATIENT: Tony Benson  [id number]  08/10/1960   DATE OF VISIT: 03/24/23  REASON FOR CONSULT: BRENO REMO is a 62 y.o. male with multiple medical problems including OSA on CPAP, diabetes.  Patient was hospitalized November 2024 with shortness of breath/acute PE, bilateral DVT.  CT of the abdomen and pelvis found to have a pancreatic head mass and liver masses.  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   INTERVAL HISTORY: Patient had a port placed this morning.  Presents to [facility] for evaluation of lower extremity edema.  Patient has had bilateral lower extremity edema since his hospitalization.  At that time, Dopplers confirmed bilateral lower extremity DVTs in addition to acute PE.  Patient has been taking Eliquis.  Unclear if edema has worsened since that hospitalization but patient now having weeping.  Denies chest pain or shortness of breath.  Has poor oral intake.  Some dizziness when standing.  Denies any neurologic complaints. Denies recent fevers or illnesses. Denies any easy bleeding or bruising. Denies chest pain. Denies any nausea, vomiting, constipation, or diarrhea. Denies urinary complaints. Patient offers no further specific complaints today.  PAST MEDICAL HISTORY: Past Medical History:  Diagnosis Date   Asthma    Cancer (HCC)    prostate   Diabetes mellitus without complication (HCC)    type II   Hyperlipidemia    Hypertension    Sleep apnea    uses Cpap machine     PAST SURGICAL HISTORY:  Past Surgical History:  Procedure Laterality Date    CHOLECYSTECTOMY     COLONOSCOPY WITH PROPOFOL N/A 11/18/2018   Procedure: COLONOSCOPY WITH PROPOFOL;  Surgeon: Pasty Spillers, MD;  Location: [facility] ENDOSCOPY;  Service: Gastroenterology;  Laterality: N/A;   COLONOSCOPY WITH PROPOFOL N/A 02/05/2023   Procedure: COLONOSCOPY WITH PROPOFOL;  Surgeon: Toney Reil, MD;  Location: [facility] ENDOSCOPY;  Service: Gastroenterology;  Laterality: N/A;   IR IMAGING GUIDED PORT INSERTION  03/24/2023   IR US LIVER BIOPSY  03/03/2023   LAPAROSCOPIC RETROPUBIC PROSTATECTOMY     2023   LUNG SURGERY     "ligation of thoracic duct"   PROSTATE BIOPSY N/A 09/21/2020   Procedure: PROSTATE BIOPSY [name];  Surgeon: [name], MD;  Location: [facility] ORS;  Service: Urology;  Laterality: N/A;    HEMATOLOGY/ONCOLOGY HISTORY:  Oncology History Overview Note   # NOV 2024- Liver, needle/core biopsy,  :      - METASTATIC CARCINOMA      - SEE NOTE       Diagnosis Note : The patient's clinical history of prostate cancer, recent      pleural effusion and MRI findings significant for a large head of pancreas mass      and multiple liver masses are noted.  Immunohistochemistry is performed on block      1B.  The tumor cells are diffusely positive for CK7 while negative for CK20,      CDX-2, TTF-1, and NKX3.1 (prostate marker).  The immunohistochemical staining      pattern is not specific and may be seen in cancers of the upper GI tract,      pancreaticobiliary system, lung (  less likely due to TTF-1 negativity) and      breast.  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  Clinical and radiological correlation recommended.  Dr. [place]      reviewed the case and agrees with the above diagnosis.   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)  03/05/2023 Initial Diagnosis   Pancreatic cancer  metastasized to liver (HCC)   03/12/2023 -  Chemotherapy   Patient is on Treatment Plan : PANCREATIC Abraxane D1,8,15 + Gemcitabine D1,8,15 q28d     03/12/2023 Cancer Staging   Staging form: Exocrine Pancreas, AJCC 8th Edition - Clinical: Stage IV (cT4, cN2, pM1) - Signed by Earna Coder, MD on 03/12/2023     ALLERGIES:  is allergic to other, morphine and codeine, tree extract, and wound dressing adhesive.  MEDICATIONS:  Current Outpatient Medications  Medication Sig Dispense Refill   acetaminophen (TYLENOL) 500 MG tablet Take 500 mg by mouth every 6 (six) hours as needed.     ALPRAZolam (XANAX) 0.5 MG tablet Take 1 tablet (0.5 mg total) by mouth 2 (two) times daily as needed for anxiety. 10 tablet 0   amLODipine (NORVASC) 10 MG tablet Take 10 mg by mouth daily.  1   apixaban (ELIQUIS) 5 MG TABS tablet Take 2 tablets (10 mg total) by mouth 2 (two) times daily for 5 days, THEN 1 tablet (5 mg total) 2 (two) times daily for 25 days. 70 tablet 0   atorvastatin (LIPITOR) 10 MG tablet Take 10 mg by mouth daily.     feeding supplement (ENSURE ENLIVE / ENSURE PLUS) LIQD Take 237 mLs by mouth 2 (two) times daily between meals. 14220 mL 0   fentaNYL (DURAGESIC) 12 MCG/HR Place 1 patch onto the skin every 3 (three) days. 10 patch 0   HYDROmorphone (DILAUDID) 4 MG tablet Take 1 tablet (4 mg total) by mouth every 4 (four) hours as needed for severe pain (pain score 7-10). 90 tablet 0   insulin aspart (NOVOLOG) 100 UNIT/ML FlexPen Inject 5 Units into the skin 3 (three) times daily with meals. If eating and Blood Glucose (BG) 80 or higher inject 5 units for meal coverage and add correction dose per scale. If not eating, correction dose only. BG <150= 0 unit; BG 150-200= 1 unit; BG 201-250= 2 unit; BG 251-300= 3 unit; BG 301-350= 4 unit; BG 351-400= 5 unit; BG >400= 6 unit and Call Primary care. 15 mL 0   JARDIANCE 25 MG TABS tablet Take 25 mg by mouth daily.     lidocaine-prilocaine (EMLA) cream  Apply on the port. 30 -45 min  prior to port access. 30 g 3   losartan (COZAAR) 100 MG tablet Take 100 mg by mouth daily.  1   metoprolol tartrate (LOPRESSOR) 50 MG tablet Take 1 tablet (50 mg total) by mouth 2 (two) times daily. 60 tablet 0   mometasone-formoterol (DULERA) 100-5 MCG/ACT AERO Inhale 2 puffs into the lungs 2 (two) times daily as needed for wheezing or shortness of breath.     naloxone (NARCAN) nasal spray 4 mg/0.1 mL One spray in nostril for unresponsiveness 2 each 0   Omega-3 Fatty Acids (FISH OIL) 1000 MG CAPS Take 1,000 mg by mouth daily.     ondansetron (ZOFRAN) 8 MG tablet One pill every 8 hours as  needed for nausea/vomitting. 40 tablet 1   prochlorperazine (COMPAZINE) 10 MG tablet Take 1 tablet (10 mg total) by mouth every 6 (six) hours as needed for nausea or vomiting. 40 tablet 1   No current facility-administered medications for this visit.   Facility-Administered Medications Ordered in Other Visits  Medication Dose Route Frequency Provider Last Rate Last Admin   HYDROmorphone (DILAUDID) tablet 4 mg  4 mg Oral Once Covington, Jamie R, NP        VITAL SIGNS: BP (!) 89/55 (BP Location: Left Arm, Patient Position: Sitting)   Pulse 95   Temp (!) 96.5 F (35.8 C) (Tympanic)   Wt 150 lb (68 kg)   SpO2 100%   BMI 21.52 kg/m  Filed Weights   03/24/23 1342  Weight: 150 lb (68 kg)    Estimated body mass index is 21.52 kg/m as calculated from the following:   Height as of an earlier encounter on 03/24/23: 5\' 10"  (1.778 m).   Weight as of this encounter: 150 lb (68 kg).  LABS: CBC:    Component Value Date/Time   WBC 11.9 (H) 03/24/2023 1326   WBC 21.7 (H) 03/07/2023 0248   HGB 9.0 (L) 03/24/2023 1326   HCT 30.7 (L) 03/24/2023 1326   PLT 547 (H) 03/24/2023 1326   MCV 88.7 03/24/2023 1326   NEUTROABS 9.4 (H) 03/24/2023 1326   LYMPHSABS 0.4 (L) 03/24/2023 1326   MONOABS 1.6 (H) 03/24/2023 1326   EOSABS 0.3 03/24/2023 1326   BASOSABS 0.0 03/24/2023 1326    Comprehensive Metabolic Panel:    Component Value Date/Time   NA 137 03/24/2023 1326   K 4.7 03/24/2023 1326   CL 103 03/24/2023 1326   CO2 25 03/24/2023 1326   BUN 26 (H) 03/24/2023 1326   CREATININE 0.71 03/24/2023 1326   GLUCOSE 285 (H) 03/24/2023 1326   CALCIUM 10.1 03/24/2023 1326   AST 25 03/24/2023 1326   ALT 23 03/24/2023 1326   ALKPHOS 666 (H) 03/24/2023 1326   BILITOT 1.2 (H) 03/24/2023 1326   PROT 6.8 03/24/2023 1326   ALBUMIN 2.3 (L) 03/24/2023 1326    RADIOGRAPHIC STUDIES: IR IMAGING GUIDED PORT INSERTION  Result Date: 03/24/2023 CLINICAL DATA:  Metastatic pancreas CANCER TO THE LIVER EXAM: RIGHT INTERNAL JUGULAR SINGLE LUMEN POWER PORT CATHETER INSERTION Date:  03/24/2023 03/24/2023 11:55 am Radiologist:  Judie Petit. Ruel Favors, MD Guidance:  Ultrasound and fluoroscopic MEDICATIONS: 1% lidocaine local with epinephrine ANESTHESIA/SEDATION: Versed 0.5 mg IV; Fentanyl 25 mcg IV; Moderate Sedation Time:  30 minutes The patient was continuously monitored during the procedure by the interventional radiology nurse under my direct supervision. FLUOROSCOPY: 0 minutes, 71 seconds (3.4 mGy) COMPLICATIONS: None immediate. CONTRAST:  None. PROCEDURE: Informed consent was obtained from the patient following explanation of the procedure, risks, benefits and alternatives. The patient understands, agrees and consents for the procedure. All questions were addressed. A time out was performed. Maximal barrier sterile technique utilized including caps, mask, sterile gowns, sterile gloves, large sterile drape, hand hygiene, and 2% chlorhexidine scrub. Under sterile conditions and local anesthesia, right internal jugular micropuncture venous access was performed. Access was performed with ultrasound. Images were obtained for documentation of the patent right internal jugular vein. A guide wire was inserted followed by a transitional dilator. This allowed insertion of a guide wire and catheter into the IVC.  Measurements were obtained from the SVC / RA junction back to the right IJ venotomy site. In the right infraclavicular chest, a subcutaneous pocket was created over  the second anterior rib. This was done under sterile conditions and local anesthesia. 1% lidocaine with epinephrine was utilized for this. A 2.5 cm incision was made in the skin. Blunt dissection was performed to create a subcutaneous pocket over the right pectoralis major muscle. The pocket was flushed with saline vigorously. There was adequate hemostasis. The port catheter was assembled and checked for leakage. The port catheter was secured in the pocket with two retention sutures. The tubing was tunneled subcutaneously to the right venotomy site and inserted into the SVC/RA junction through a valved peel-away sheath. Position was confirmed with fluoroscopy. Images were obtained for documentation. The patient tolerated the procedure well. No immediate complications. Incisions were closed in a two layer fashion with 4 - 0 Vicryl suture. Dermabond was applied to the skin. The port catheter was accessed, blood was aspirated followed by saline and heparin flushes. Needle was removed. A dry sterile dressing was applied. IMPRESSION: Ultrasound and fluoroscopically guided right internal jugular single lumen power port catheter insertion. Tip in the SVC/RA junction. Catheter ready for use. Electronically Signed   By: Judie Petit.  Shick M.D.   On: 03/24/2023 12:17   DG Chest Port 1 View  Result Date: 03/05/2023 CLINICAL DATA:  Shortness of breath.  Follow-up exam. EXAM: PORTABLE CHEST 1 VIEW COMPARISON:  Chest radiograph dated 02/27/2023. FINDINGS: Small right pleural effusion. Right lung base opacity as well as faint clusters of ground-glass density involving the right mid to lower lung field consistent with pneumonia. Overall interval worsening of pulmonary infiltrate since the radiograph of 02/27/2023. The left lung is clear. No pneumothorax. Stable cardiac  silhouette. No acute osseous pathology. IMPRESSION: Interval worsening of right lung pneumonia. Electronically Signed   By: Elgie Collard M.D.   On: 03/05/2023 23:52   ECHOCARDIOGRAM LIMITED  Result Date: 03/04/2023    ECHOCARDIOGRAM LIMITED REPORT   Patient Name:   MENELIK KOSIK Date of Exam: 03/04/2023 Medical Rec #:  [id number]     Height:       69.0 in Accession #:    [id number]    Weight:       148.8 lb Date of Birth:  1961-03-20    BSA:          1.822 m Patient Age:    61 years      BP:           117/60 mmHg Patient Gender: M             HR:           112 bpm. Exam Location:  [facility] Procedure: Limited Echo, Color Doppler and Cardiac Doppler Indications:     Pericardial Effusion I31.3  History:         Patient has prior history of Echocardiogram examinations, most                  recent 03/01/2023. Risk Factors:Diabetes, Hypertension and                  Sleep Apnea.  Sonographer:     Cristela Blue Referring Phys:  [id number] Marrion Coy Diagnosing Phys: Yvonne Kendall MD IMPRESSIONS  1. Left ventricular ejection fraction, by estimation, is >55%. The left ventricle has normal function. The left ventricle has no regional wall motion abnormalities.  2. Right ventricular systolic function is normal. The right ventricular size is normal.  3. Moderate pericardial effusion. The pericardial effusion is circumferential. There is no evidence of cardiac tamponade.  4. The mitral valve  is normal in structure. Comparison(s): A prior study was performed on 03/01/2023. Pericardial effusion appears slightly smaller. FINDINGS  Left Ventricle: Left ventricular ejection fraction, by estimation, is >55%. The left ventricle has normal function. The left ventricle has no regional wall motion abnormalities. The left ventricular internal cavity size was normal in size. There is borderline left ventricular hypertrophy. Right Ventricle: The right ventricular size is normal. No increase in right ventricular wall thickness. Right  ventricular systolic function is normal. Pericardium: A moderately sized pericardial effusion is present. The pericardial effusion is circumferential. There is no evidence of cardiac tamponade. Mitral Valve: The mitral valve is normal in structure. Tricuspid Valve: The tricuspid valve is grossly normal. Tricuspid valve regurgitation is mild. Aorta: The aortic root is normal in size and structure. Venous: The inferior vena cava was not well visualized. Additional Comments: Spectral Doppler performed. Color Doppler performed.  LEFT VENTRICLE PLAX 2D LVIDd:         4.30 cm LVIDs:         2.30 cm LV PW:         1.12 cm LV IVS:        0.95 cm   AORTA Ao Root diam: 3.30 cm Yvonne Kendall MD Electronically signed by Yvonne Kendall MD Signature Date/Time: 03/04/2023/11:43:49 AM    Final    IR US LIVER BIOPSY  Result Date: 03/03/2023 INDICATION: 62 year old male with pancreatic mass and concern for hepatic metastatic disease. EXAM: LIVER CORE BIOPSY MEDICATIONS: None. ANESTHESIA/SEDATION: Moderate (conscious) sedation was employed during this procedure. A total of Versed 1 mg and Fentanyl 50 mcg was administered intravenously. Moderate Sedation Time: 10 minutes. The patient's level of consciousness and vital signs were monitored continuously by radiology nursing throughout the procedure under my direct supervision. FLUOROSCOPY TIME:  None. COMPLICATIONS: None immediate. PROCEDURE: Informed written consent was obtained from the patient after a thorough discussion of the procedural risks, benefits and alternatives. All questions were addressed. Maximal Sterile Barrier Technique was utilized including caps, mask, sterile gowns, sterile gloves, sterile drape, hand hygiene and skin antiseptic. A timeout was performed prior to the initiation of the procedure. The right upper quadrant was interrogated with ultrasound. Multiple echogenic masses present throughout the liver. A suitable skin entry site was selected and marked.  The region was sterilely prepped and draped in the standard fashion using chlorhexidine skin prep. Local anesthesia was attained by infiltration with 1% lidocaine. A small dermatotomy was made. Under real-time sonographic guidance, a 17 gauge introducer needle was advanced into the margin of the mass. Multiple 18 gauge biopsies were then coaxially obtained using the BioPince automated biopsy device. Biopsy samples were placed in formalin and delivered to pathology for further analysis. As a 17 gauge introducer needle was removed, the biopsy tract was embolized with a Gel-Foam slurry. Post biopsy ultrasound imaging demonstrates no evidence of active hemorrhage or perihepatic hematoma. The patient tolerated the procedure well. IMPRESSION: Technically successful ultrasound-guided core biopsy of hepatic lesion. Electronically Signed   By: Malachy Moan M.D.   On: 03/03/2023 14:59   ECHOCARDIOGRAM COMPLETE  Result Date: 03/01/2023    ECHOCARDIOGRAM REPORT   Patient Name:   MISHAWN BARTKIEWICZ Date of Exam: 03/01/2023 Medical Rec #:  [id number]     Height:       69.0 in Accession #:    [id number]    Weight:       149.9 lb Date of Birth:  09/04/1960    BSA:  1.828 m Patient Age:    61 years      BP:           138/75 mmHg Patient Gender: M             HR:           116 bpm. Exam Location:  [facility] Procedure: 2D Echo Indications:     Pulmonary Embolus I26.09  History:         Patient has no prior history of Echocardiogram examinations.  Sonographer:     Overton Mam RDCS, FASE Referring Phys:  [id number] Vernetta Honey MANSY Diagnosing Phys: Julien Nordmann MD IMPRESSIONS  1. Left ventricular ejection fraction, by estimation, is 60 to 65%. The left ventricle has normal function. The left ventricle has no regional wall motion abnormalities. Left ventricular diastolic parameters are indeterminate.  2. Right ventricular systolic function is normal. The right ventricular size is normal. There is moderately elevated pulmonary  artery systolic pressure. The estimated right ventricular systolic pressure is 54.0 mmHg.  3. Large pericardial effusion. The pericardial effusion is circumferential. There is no evidence of cardiac tamponade. 1.95 off the RV free wall, 2.16 cm off the apical region, 1.70 cm off the LV free wall. IVC is not dilated at 1.16 cm  4. The mitral valve is normal in structure. No evidence of mitral valve regurgitation. No evidence of mitral stenosis.  5. The aortic valve is tricuspid. Aortic valve regurgitation is not visualized. No aortic stenosis is present.  6. The inferior vena cava is normal in size with greater than 50% respiratory variability, suggesting right atrial pressure of 3 mmHg. FINDINGS  Left Ventricle: Left ventricular ejection fraction, by estimation, is 60 to 65%. The left ventricle has normal function. The left ventricle has no regional wall motion abnormalities. The left ventricular internal cavity size was normal in size. There is  no left ventricular hypertrophy. Left ventricular diastolic parameters are indeterminate. Right Ventricle: The right ventricular size is normal. No increase in right ventricular wall thickness. Right ventricular systolic function is normal. There is moderately elevated pulmonary artery systolic pressure. The tricuspid regurgitant velocity is 3.50 m/s, and with an assumed right atrial pressure of 5 mmHg, the estimated right ventricular systolic pressure is 54.0 mmHg. Left Atrium: Left atrial size was normal in size. Right Atrium: Right atrial size was normal in size. Pericardium: A large pericardial effusion is present. The pericardial effusion is circumferential. There is no evidence of cardiac tamponade. Mitral Valve: The mitral valve is normal in structure. No evidence of mitral valve regurgitation. No evidence of mitral valve stenosis. Tricuspid Valve: The tricuspid valve is normal in structure. Tricuspid valve regurgitation is mild . No evidence of tricuspid stenosis.  Aortic Valve: The aortic valve is tricuspid. Aortic valve regurgitation is not visualized. No aortic stenosis is present. Aortic valve peak gradient measures 12.0 mmHg. Pulmonic Valve: The pulmonic valve was normal in structure. Pulmonic valve regurgitation is not visualized. No evidence of pulmonic stenosis. Aorta: The aortic root is normal in size and structure. Venous: The inferior vena cava is normal in size with greater than 50% respiratory variability, suggesting right atrial pressure of 3 mmHg. IAS/Shunts: No atrial level shunt detected by color flow Doppler.  LEFT VENTRICLE PLAX 2D LVIDd:         4.60 cm   Diastology LVIDs:         2.90 cm   LV e' medial:    11.70 cm/s LV PW:  1.00 cm   LV E/e' medial:  8.8 LV IVS:        1.00 cm   LV e' lateral:   18.60 cm/s LVOT diam:     2.10 cm   LV E/e' lateral: 5.5 LV SV:         67 LV SV Index:   37 LVOT Area:     3.46 cm  RIGHT VENTRICLE RV Basal diam:  2.50 cm RV S prime:     22.30 cm/s TAPSE (M-mode): 1.8 cm LEFT ATRIUM             Index        RIGHT ATRIUM          Index LA diam:        2.70 cm 1.48 cm/m   RA Area:     7.05 cm LA Vol (A2C):   45.8 ml 25.06 ml/m  RA Volume:   9.68 ml  5.30 ml/m LA Vol (A4C):   25.5 ml 13.95 ml/m LA Biplane Vol: 35.4 ml 19.37 ml/m  AORTIC VALVE                 PULMONIC VALVE AV Area (Vmax): 2.46 cm     PV Vmax:        1.10 m/s AV Vmax:        173.00 cm/s  PV Peak grad:   4.8 mmHg AV Peak Grad:   12.0 mmHg    RVOT Peak grad: 4 mmHg LVOT Vmax:      123.00 cm/s LVOT Vmean:     79.500 cm/s LVOT VTI:       0.193 m  AORTA                        PULMONARY ARTERY Ao Root diam: 3.50 cm        MPA diam:        3.00 cm Ao Asc diam:  2.70 cm MITRAL VALVE                TRICUSPID VALVE MV Area (PHT): 7.22 cm     TR Peak grad:   49.0 mmHg MV Decel Time: 105 msec     TR Vmax:        350.00 cm/s MV E velocity: 103.00 cm/s MV A velocity: 87.50 cm/s   SHUNTS MV E/A ratio:  1.18         Systemic VTI:  0.19 m                              Systemic Diam: 2.10 cm Julien Nordmann MD Electronically signed by Julien Nordmann MD Signature Date/Time: 03/01/2023/3:02:54 PM    Final    MR LIVER W WO CONTRAST  Result Date: 03/01/2023 CLINICAL DATA:  62 year old male with history of shortness of breath recently diagnosed with blood clots in the legs. Hepatic masses identified on recent chest CTA. Further evaluation to exclude the possibility of malignancy. EXAM: MRI ABDOMEN WITHOUT AND WITH CONTRAST TECHNIQUE: Multiplanar multisequence MR imaging of the abdomen was performed both before and after the administration of intravenous contrast. CONTRAST:  7mL GADAVIST GADOBUTROL 1 MMOL/ML IV SOLN COMPARISON:  No prior abdominal MRI.  Chest CTA 02/27/2023. FINDINGS: Comment: Portions of today's examination are limited by considerable patient respiratory motion. Lower chest: Small right pleural effusion with areas of atelectasis and/or scarring in the right lung base. Hepatobiliary: There are  multiple hepatic lesions highly concerning for widespread metastatic disease to the liver. The largest of these is a dominant lesion in the central aspect of the liver, predominantly centered in segment 8 (axial image 15 of series 4 and coronal image 22 series 3) measuring up to 13.7 x 9.6 x 12.1 cm. These lesions are heterogeneous in signal intensity but generally T1 hypointense, slightly T2 hyperintense, hypovascular with predominantly peripheral enhancement which increases slightly on delayed images, and extensive internal diffusion restriction. No definite hypervascular hepatic lesion is identified. Gallbladder is not visualized, likely surgically absent. No intra or extrahepatic biliary ductal dilatation. Pancreas: In the region of the pancreatic head (best appreciated on axial image 55 of series 14 and coronal image 45 of series 20) there is what appears to be a poorly defined hypovascular mass estimated to measure approximately 4.0 x 2.6 x 2.9 cm, which is located  immediately anterior and lateral to the superior mesenteric vein adjacent to the splenic pulmonary confluence, and inferior to the proximal portal vein, with minimal obscuration of the intervening fat plane adjacent to the superior mesenteric vein. This lesion appears to obstruct the main pancreatic duct (axial image 49 of series 14), proximal to which the main pancreatic duct is severely dilated measuring up to 12 mm in the body of the pancreas. Diffuse atrophy throughout the body and tail of the pancreas is noted. 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 Spleen:  Unremarkable. Adrenals/Urinary Tract: Multiple T1 hypointense, T2 hyperintense, nonenhancing lesions in the kidneys are compatible with simple cysts (Bosniak class 1), which require no imaging follow-up, largest of which measure up to approximately 2.2 cm in diameter in the lower pole of the left kidney. No aggressive appearing renal lesions. No hydroureteronephrosis in the visualized portions of the abdomen. Bilateral adrenal glands are normal in appearance. Stomach/Bowel: Visualized portions are unremarkable. Vascular/Lymphatic: No aneurysm identified in the visualized abdominal vasculature. No definite lymphadenopathy confidently identified in the abdomen. Other:  Trace volume of perihepatic ascites. Musculoskeletal: No aggressive appearing osseous lesions are noted in the visualized portions of the skeleton. IMPRESSION: 1. Large hypovascular mass in the region of the head of the pancreas causing occlusion of the main pancreatic duct, highly concerning for primary pancreatic neoplasm. Numerous hypovascular lesions scattered throughout the liver, presumably reflective of widespread  metastatic disease to the liver, with the largest lesion a dominant mass centered in the right lobe of the liver measuring up to 13.7 x 9.6 x 12.1 cm. 2. Trace volume of perihepatic ascites. 3. Fluid collection anterior to the mass adjacent to the head of the pancreas, presumably a proteinaceous/hemorrhagic pancreatic pseudocyst. 4. Small right pleural effusion. 5. Additional incidental findings, as above. Electronically Signed   By: Trudie Reed M.D.   On: 03/01/2023 05:35   CT Angio Chest PE W and/or Wo Contrast  Result Date: 02/27/2023 CLINICAL DATA:  Shortness of breath blood clots in leg EXAM: CT ANGIOGRAPHY CHEST WITH CONTRAST TECHNIQUE: Multidetector CT imaging of the chest was performed using the standard protocol during bolus administration of intravenous contrast. Multiplanar CT image reconstructions and MIPs were obtained to evaluate the vascular anatomy. RADIATION DOSE REDUCTION: This exam was performed according to the departmental dose-optimization program which includes automated exposure control, adjustment of the mA and/or kV according to patient  size and/or use of iterative reconstruction technique. CONTRAST:  75mL OMNIPAQUE IOHEXOL 350 MG/ML SOLN COMPARISON:  Chest x-ray 02/27/2023 FINDINGS: Cardiovascular: Satisfactory opacification of the pulmonary arteries to the segmental level. Small nonocclusive segmental/subsegmental embolus within the left upper lobe, series 6, image 168. No other discrete emboli are visualized. Mild atherosclerosis. No aneurysm or dissection. Normal cardiac size. Moderate to large circumferential pericardial effusion measuring up to 2.1 cm, slight increased density values suggesting complex fluid. Mediastinum/Nodes: Midline trachea. No thyroid mass. Subcarinal nodes measuring at least up to 2 cm. Esophagus within normal limits. Small clips in the posterior mediastinum. Lungs/Pleura: Small circumferential right pleural effusion. Calcifications or sutures at the  posterior right lung base. Prominent peribronchovascular thickening within the right perihilar region and within the right middle and lower lobe bronchi. Multifocal bilateral ground-glass densities and areas of tree-in-bud density suspicious for respiratory infection Upper Abdomen: Large hypodense right hepatic mass suspected, this measures at least 12.6 cm. Another hypodense inferior right hepatic lobe lesion measuring 2.8 cm on series 4, image 153 incompletely assessed. Musculoskeletal: No acute osseous abnormality. Review of the MIP images confirms the above findings. IMPRESSION: 1. Positive for small nonocclusive segmental/subsegmental pulmonary embolus within the left upper lobe. 2. Moderate to large circumferential pericardial effusion measuring up to 2.1 cm, slight increased density values suggesting complex fluid. Recommend correlation with echocardiogram 3. Small circumferential right pleural effusion. Prominent peribronchovascular thickening within the right perihilar region and within the right upper, middle and lower lobe bronchi, with mild mucous plugging in the right lower lobe. Multifocal bilateral ground-glass densities and areas of tree-in-bud density suspicious for respiratory infection/pneumonia, possible atypical infection. 4. Subcarinal adenopathy, reactive versus metastatic. 5. Right hepatic masses measuring up to 12.6 cm. Findings are suspicious for metastatic disease versus liver carcinoma. When the patient is clinically stable and able to follow directions and hold their breath (preferably as an outpatient) further evaluation with dedicated abdominal MRI should be considered. Critical Value/emergent results were called by telephone at the time of interpretation on 02/27/2023 at 11:02 pm to provider [facility] , who verbally acknowledged these results. Electronically Signed   By: Jasmine Pang M.D.   On: 02/27/2023 23:04   DG Chest 2 View  Result Date: 02/27/2023 CLINICAL DATA:   Shortness of breath EXAM: CHEST - 2 VIEW COMPARISON:  08/04/2013 FINDINGS: Left lung is grossly clear. Normal cardiac size. Small loculated pleural effusion versus pleural thickening. Airspace disease at the right middle lobe and right base. No pneumothorax IMPRESSION: Airspace disease at the right middle lobe and right base, possible pneumonia. Small loculated pleural effusion versus pleural thickening. Electronically Signed   By: Jasmine Pang M.D.   On: 02/27/2023 22:45   US Venous Img Lower Bilateral (DVT)  Result Date: 02/26/2023 CLINICAL DATA:  Leg swelling for 2 weeks EXAM: BILATERAL LOWER EXTREMITY VENOUS DOPPLER ULTRASOUND TECHNIQUE: Gray-scale sonography with graded compression, as well as color Doppler and duplex ultrasound were performed to evaluate the lower extremity deep venous systems from the level of the common femoral vein and including the common femoral, femoral, profunda femoral, popliteal and calf veins including the posterior tibial, peroneal and gastrocnemius veins when visible. The superficial great saphenous vein was also interrogated. Spectral Doppler was utilized to evaluate flow at rest and with distal augmentation maneuvers in the common femoral, femoral and popliteal veins. COMPARISON:  None Available. FINDINGS: RIGHT LOWER EXTREMITY Common Femoral Vein: No evidence of thrombus. Normal compressibility, respiratory phasicity and response to augmentation. Saphenofemoral Junction: No evidence of thrombus.  Normal compressibility and flow on color Doppler imaging. Profunda Femoral Vein: No evidence of thrombus. Normal compressibility and flow on color Doppler imaging. Femoral Vein: Poor flow on Doppler. Poor compression with hypoechoic material. Popliteal Vein: Poor flow on Doppler with poor compression and luminal hypoechoic appearance Calf Veins: Areas of thrombus along the posterior tibial and peroneal vein. Superficial Great Saphenous Vein: No evidence of thrombus. Normal  compressibility. Venous Reflux:  None. Other Findings:  None. LEFT LOWER EXTREMITY Common Femoral Vein: No evidence of thrombus. Normal compressibility, respiratory phasicity and response to augmentation. Saphenofemoral Junction: No evidence of thrombus. Normal compressibility and flow on color Doppler imaging. Profunda Femoral Vein: No evidence of thrombus. Normal compressibility and flow on color Doppler imaging. Femoral Vein: No evidence of thrombus. Normal compressibility, respiratory phasicity and response to augmentation. Popliteal Vein: Incomplete compression and poor flow on Doppler, color and spectral Calf Veins: Areas of thrombus in the peroneal and posterior tibial veins. Superficial Great Saphenous Vein: No evidence of thrombus. Normal compressibility. Venous Reflux:  None. Other Findings:  None. Critical Value/emergent results were called by telephone at the time of interpretation on 02/26/2023 at 6:04 pm to provider [facility] , who verbally acknowledged these results. IMPRESSION: Bilateral DVT. Right femoral vein down through the calf veins. Left popliteal and calf veins. Electronically Signed   By: Karen Kays M.D.   On: 02/26/2023 18:14    PERFORMANCE STATUS (ECOG) : 2 - Symptomatic, <50% confined to bed  Review of Systems Unless otherwise noted, a complete review of systems is negative.  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 DVT/PE.  He is anticoagulated on Eliquis.  Low suspicion of progressive DVTs.   However, spoke with Dr. Donneta Romberg who recommended repeating Dopplers.  Patient also with significant hypoalbuminemia, which is likely contributing to persistent lower extremity edema.  Recommended conservative measures such as elevating feet.  Keeping feet dry.  Patient also recommended to hold Norvasc.  Hypotension -patient took all 3 of his antihypertensives (amlodipine, losartan, and metoprolol) this morning.  Discussed with Dr. Donneta Romberg and patient advised to hold amlodipine.  Patient also recommended to reach out to his PCP -Dr. Dario Guardian regarding dose adjustments of antihypertensive regimen.  Patient likely intravascularly depleted.  Will proceed with gentle IV fluids today.  Follow-up Wednesday.  Case and plan discussed with Dr. Donneta Romberg  Patient expressed understanding and was in agreement with this plan. He also understands that He can call clinic at any time with any questions, concerns, or complaints.   Thank you for allowing me to participate in the care of this very pleasant patient.   Time Total: 25 minutes  Visit consisted of counseling and education dealing with the complex and emotionally intense issues of symptom management in the setting of serious illness.Greater than 50%  of this time was spent counseling and coordinating care related to the above assessment and plan.  Signed by: Laurette Schimke, PhD, NP-C

## 2023-03-24 NOTE — Procedures (Signed)
Interventional Radiology Procedure Note  Procedure: RT internal jugular POWER PORT    Complications: None  Estimated Blood Loss:  MIN  Findings: TIP SVCRA    M. TREVOR Levaeh Vice, MD    

## 2023-03-24 NOTE — Telephone Encounter (Signed)
Prescription was filled 12/5 and she was informed of this and will check with [facility]

## 2023-03-24 NOTE — Telephone Encounter (Signed)
Patient daughter called stating he was leaking fluid from his left leg. Patient daughter was wondering if he should get his port placement this morning with the legs leaking.   I called patient daughter back after speaking with nurse and MD team and let her know that the team thinks its best for patient to still get his port placed and then come to our office to be evaluated. I did let her know that if IR couldn't  do the port because of this then come see [place] sooner. Patient daughter agreed to this.

## 2023-03-25 ENCOUNTER — Ambulatory Visit
Admission: RE | Admit: 2023-03-25 | Discharge: 2023-03-25 | Disposition: A | Discharge status: Home / Self Care | Payer: BC Managed Care – PPO | Source: Ambulatory Visit | Attending: Hospice and Palliative Medicine | Admitting: Hospice and Palliative Medicine

## 2023-03-25 ENCOUNTER — Telehealth: Payer: Self-pay | Admitting: *Deleted

## 2023-03-25 DIAGNOSIS — R2243 Localized swelling, mass and lump, lower limb, bilateral: Secondary | ICD-10-CM | POA: Diagnosis present

## 2023-03-25 NOTE — Telephone Encounter (Signed)
Per v/o Josh- please schedule a stat lower extremity doppler. Please call wife with apts. Order is in per [facility]

## 2023-03-25 NOTE — Telephone Encounter (Signed)
Rn. Left vm for patient's wife. Requested call back to discuss pt's u/s results. Per Sharia Reeve, NP.   The u/s is unchanged in comparison to previous scans. Pt should continue eliquis.

## 2023-03-25 NOTE — Telephone Encounter (Signed)
Wife returned my phone call. Results and plan of care reviewed with patient's wife. Wife gave verbal understanding of the plan.

## 2023-03-26 ENCOUNTER — Inpatient Hospital Stay: Payer: BC Managed Care – PPO

## 2023-03-26 ENCOUNTER — Encounter: Payer: Self-pay | Admitting: Internal Medicine

## 2023-03-26 ENCOUNTER — Telehealth: Payer: Self-pay | Admitting: *Deleted

## 2023-03-26 ENCOUNTER — Inpatient Hospital Stay: Payer: BC Managed Care – PPO | Admitting: Hospice and Palliative Medicine

## 2023-03-26 ENCOUNTER — Other Ambulatory Visit: Payer: Self-pay | Admitting: *Deleted

## 2023-03-26 ENCOUNTER — Encounter: Payer: BC Managed Care – PPO | Admitting: Hospice and Palliative Medicine

## 2023-03-26 ENCOUNTER — Inpatient Hospital Stay (HOSPITAL_BASED_OUTPATIENT_CLINIC_OR_DEPARTMENT_OTHER): Payer: BC Managed Care – PPO | Admitting: Internal Medicine

## 2023-03-26 VITALS — BP 140/83 | HR 96 | Temp 97.5°F | Resp 19

## 2023-03-26 VITALS — BP 97/66 | HR 94 | Temp 98.2°F | Resp 18 | Wt 149.2 lb

## 2023-03-26 DIAGNOSIS — C259 Malignant neoplasm of pancreas, unspecified: Secondary | ICD-10-CM

## 2023-03-26 DIAGNOSIS — C787 Secondary malignant neoplasm of liver and intrahepatic bile duct: Secondary | ICD-10-CM

## 2023-03-26 DIAGNOSIS — Z5111 Encounter for antineoplastic chemotherapy: Secondary | ICD-10-CM | POA: Diagnosis not present

## 2023-03-26 LAB — CMP (CANCER CENTER ONLY)
ALT: 19 U/L (ref 0–44)
AST: 22 U/L (ref 15–41)
Albumin: 2.2 g/dL — ABNORMAL LOW (ref 3.5–5.0)
Alkaline Phosphatase: 591 U/L — ABNORMAL HIGH (ref 38–126)
Anion gap: 6 (ref 5–15)
BUN: 19 mg/dL (ref 8–23)
CO2: 25 mmol/L (ref 22–32)
Calcium: 10 mg/dL (ref 8.9–10.3)
Chloride: 105 mmol/L (ref 98–111)
Creatinine: 0.52 mg/dL — ABNORMAL LOW (ref 0.61–1.24)
GFR, Estimated: >60 mL/min (ref >60)
Glucose, Bld: 273 mg/dL — ABNORMAL HIGH (ref 70–99)
Potassium: 4.6 mmol/L (ref 3.5–5.1)
Sodium: 136 mmol/L (ref 135–145)
Total Bilirubin: 0.7 mg/dL (ref <1.2)
Total Protein: 6.3 g/dL — ABNORMAL LOW (ref 6.5–8.1)

## 2023-03-26 LAB — CBC WITH DIFFERENTIAL (CANCER CENTER ONLY)
Abs Immature Granulocytes: 0.22 10*3/uL — ABNORMAL HIGH (ref 0.00–0.07)
Basophils Absolute: 0.1 10*3/uL (ref 0.0–0.1)
Basophils Relative: 0 %
Eosinophils Absolute: 0.4 10*3/uL (ref 0.0–0.5)
Eosinophils Relative: 2 %
HCT: 27.6 % — ABNORMAL LOW (ref 39.0–52.0)
Hemoglobin: 8.4 g/dL — ABNORMAL LOW (ref 13.0–17.0)
Immature Granulocytes: 1 %
Lymphocytes Relative: 2 %
Lymphs Abs: 0.3 10*3/uL — ABNORMAL LOW (ref 0.7–4.0)
MCH: 26.3 pg (ref 26.0–34.0)
MCHC: 30.4 g/dL (ref 30.0–36.0)
MCV: 86.5 fL (ref 80.0–100.0)
Monocytes Absolute: 1.6 10*3/uL — ABNORMAL HIGH (ref 0.1–1.0)
Monocytes Relative: 9 %
Neutro Abs: 14.6 10*3/uL — ABNORMAL HIGH (ref 1.7–7.7)
Neutrophils Relative %: 86 %
Platelet Count: 567 10*3/uL — ABNORMAL HIGH (ref 150–400)
RBC: 3.19 MIL/uL — ABNORMAL LOW (ref 4.22–5.81)
RDW: 16.5 % — ABNORMAL HIGH (ref 11.5–15.5)
WBC Count: 17.2 10*3/uL — ABNORMAL HIGH (ref 4.0–10.5)
nRBC: 0 % (ref 0.0–0.2)

## 2023-03-26 MED ORDER — HYDROMORPHONE HCL 2 MG PO TABS: 4.0000 mg | ORAL_TABLET | ORAL | 0 refills | Status: DC | PRN

## 2023-03-26 MED ORDER — OXYCODONE HCL 5 MG PO TABS
10.0000 mg | ORAL_TABLET | Freq: Once | ORAL | Status: AC
  Administered 2023-03-26: 10 mg via ORAL
  Filled 2023-03-26: qty 2

## 2023-03-26 MED ORDER — OLANZAPINE 5 MG PO TABS: 5.0000 mg | ORAL_TABLET | Freq: Every day | ORAL | 3 refills | Status: DC

## 2023-03-26 MED ORDER — SODIUM CHLORIDE 0.9 % IV SOLN
1000.0000 mg/m2 | Freq: Once | INTRAVENOUS | Status: AC
  Administered 2023-03-26: 1824 mg via INTRAVENOUS
  Filled 2023-03-26: qty 47.97

## 2023-03-26 MED ORDER — SODIUM CHLORIDE 0.9 % IV SOLN
INTRAVENOUS | Status: DC
Start: 2023-03-26 — End: 2023-03-26
  Filled 2023-03-26: qty 250

## 2023-03-26 MED ORDER — PACLITAXEL PROTEIN-BOUND CHEMO INJECTION 100 MG
100.0000 mg/m2 | Freq: Once | INTRAVENOUS | Status: AC
  Administered 2023-03-26: 200 mg via INTRAVENOUS
  Filled 2023-03-26: qty 40

## 2023-03-26 MED ORDER — HEPARIN SOD (PORK) LOCK FLUSH 100 UNIT/ML IV SOLN
500.0000 [IU] | Freq: Once | INTRAVENOUS | Status: AC | PRN
  Administered 2023-03-26: 500 [IU]
  Filled 2023-03-26: qty 5

## 2023-03-26 MED ORDER — PROCHLORPERAZINE MALEATE 10 MG PO TABS
10.0000 mg | ORAL_TABLET | Freq: Once | ORAL | Status: AC
  Administered 2023-03-26: 10 mg via ORAL
  Filled 2023-03-26: qty 1

## 2023-03-26 NOTE — Telephone Encounter (Signed)
Signed.

## 2023-03-26 NOTE — Telephone Encounter (Signed)
Received Disability form for patient, form completed and sent for doctor signature

## 2023-03-26 NOTE — Progress Notes (Signed)
[facility] CONSULT NOTE  Patient Care Team: Sherrie Mustache, MD as PCP - General (Internal Medicine) Luretha Murphy, MD as Consulting Physician (General Surgery) Earna Coder, MD as Consulting Physician (Oncology) Benita Gutter, RN as Oncology Nurse Navigator  CHIEF COMPLAINTS/PURPOSE OF CONSULTATION:  pancreatic cancer  Oncology History Overview Note   # NOV 2024- Liver, needle/core biopsy,  :      - METASTATIC CARCINOMA      - SEE NOTE       Diagnosis Note : The patient's clinical history of prostate cancer, recent      pleural effusion and MRI findings significant for a large head of pancreas mass      and multiple liver masses are noted.  Immunohistochemistry is performed on block      1B.  The tumor cells are diffusely positive for CK7 while negative for CK20,      CDX-2, TTF-1, and NKX3.1 (prostate marker).  The immunohistochemical staining      pattern is not specific and may be seen in cancers of the upper GI tract,      pancreaticobiliary system, lung (less likely due to TTF-1 negativity) and      breast.  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  Clinical and radiological correlation recommended.  Dr. [place]      reviewed the case and agrees with the above diagnosis.   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)  03/05/2023 Initial Diagnosis   Pancreatic cancer metastasized to liver (HCC)   03/12/2023 -  Chemotherapy   Patient is on Treatment Plan : PANCREATIC Abraxane D1,8,15 + Gemcitabine D1,8,15 q28d     03/12/2023 Cancer Staging   Staging form: Exocrine Pancreas, AJCC 8th Edition - Clinical: Stage IV (cT4, cN2, pM1) - Signed by Earna Coder, MD on 03/12/2023    Oncology History Overview Note   # NOV 2024- Liver, needle/core biopsy,  :      -  METASTATIC CARCINOMA      - SEE NOTE       Diagnosis Note : The patient's clinical history of prostate cancer, recent      pleural effusion and MRI findings significant for a large head of pancreas mass      and multiple liver masses are noted.  Immunohistochemistry is performed on block      1B.  The tumor cells are diffusely positive for CK7 while negative for CK20,      CDX-2, TTF-1, and NKX3.1 (prostate marker).  The immunohistochemical staining      pattern is not specific and may be seen in cancers of the upper GI tract,      pancreaticobiliary system, lung (less likely due to TTF-1 negativity) and      breast.  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  Clinical and radiological correlation recommended.  Dr. [place]      reviewed the case and agrees with the above diagnosis.   # NOV 26th, 2024- cycle #1 of gemcitabine Abraxane   Pancreatic cancer metastasized to liver [facility])  03/05/2023 Initial Diagnosis   Pancreatic cancer metastasized to liver [facility])   03/12/2023 -  Chemotherapy   Patient is on  Treatment Plan : PANCREATIC Abraxane D1,8,15 + Gemcitabine D1,8,15 q28d     03/12/2023 Cancer Staging   Staging form: Exocrine Pancreas, AJCC 8th Edition - Clinical: Stage IV (cT4, cN2, pM1) - Signed by Earna Coder, MD on 03/12/2023      HISTORY OF PRESENTING ILLNESS: Patient ambulating-independently. Accompanied by his wife and daughter.   Tony Benson 62 y.o.  male pleasant patient with newly diagnosed pancreatic cancer metastatic to liver; and history of poorly controlled diabetes; acute PE/DVT on anticoagulation I currently on Gem-abraxane chemotherapy is here for a follow up.  In the interim patient underwent port placement.  Patient also evaluated by [facility] for worsening leg swelling-Dopplers ordered.  Continues to complain of shoulder pain back pain  and also sacral area.  Unable to refill Dilaudid so far. Currently on fentanyl patch.   Denies any nausea vomiting.  Appetite is poor.  Continues to have leg swelling.  Otherwise no fever no chills.  No mental status changes.   Review of Systems  Constitutional:  Positive for malaise/fatigue and weight loss. Negative for chills, diaphoresis and fever.  HENT:  Negative for nosebleeds and sore throat.   Eyes:  Negative for double vision.  Respiratory:  Positive for cough and shortness of breath. Negative for hemoptysis, sputum production and wheezing.   Cardiovascular:  Negative for chest pain, palpitations, orthopnea and leg swelling.  Gastrointestinal:  Negative for abdominal pain, blood in stool, constipation, diarrhea, heartburn, melena, nausea and vomiting.  Genitourinary:  Negative for dysuria, frequency and urgency.  Musculoskeletal:  Positive for back pain and joint pain.  Skin: Negative.  Negative for itching and rash.  Neurological:  Negative for dizziness, tingling, focal weakness, weakness and headaches.  Endo/Heme/Allergies:  Does not bruise/bleed easily.  Psychiatric/Behavioral:  Negative for depression. The patient is not nervous/anxious and does not have insomnia.     MEDICAL HISTORY:  Past Medical History:  Diagnosis Date   Asthma    Cancer (HCC)    prostate   Diabetes mellitus without complication (HCC)    type II   Hyperlipidemia    Hypertension    Sleep apnea    uses Cpap machine     SURGICAL HISTORY: Past Surgical History:  Procedure Laterality Date   CHOLECYSTECTOMY     COLONOSCOPY WITH PROPOFOL N/A 11/18/2018   Procedure: COLONOSCOPY WITH PROPOFOL;  Surgeon: Pasty Spillers, MD;  Location: [facility] ENDOSCOPY;  Service: Gastroenterology;  Laterality: N/A;   COLONOSCOPY WITH PROPOFOL N/A 02/05/2023   Procedure: COLONOSCOPY WITH PROPOFOL;  Surgeon: Toney Reil, MD;  Location[facility] ENDOSCOPY;  Service: Gastroenterology;  Laterality: N/A;   IR  IMAGING GUIDED PORT INSERTION  03/24/2023   IR US LIVER BIOPSY  03/03/2023   LAPAROSCOPIC RETROPUBIC PROSTATECTOMY     2023   LUNG SURGERY     "ligation of thoracic duct"   PROSTATE BIOPSY N/A 09/21/2020   Procedure: PROSTATE BIOPSY [name];  Surgeon: [name], MD;  Location: [facility] ORS;  Service: Urology;  Laterality: N/A;    SOCIAL HISTORY: Social History   Socioeconomic History   Marital status: Married    Spouse name: Not on file   Number of children: Not on file   Years of education: Not on file   Highest education level: Not on file  Occupational History   Occupation: Automotive engineer   Tobacco Use   Smoking status: Never   Smokeless tobacco: Never  Vaping Use   Vaping status: Never Used  Substance and Sexual Activity  Alcohol use: No   Drug use: No   Sexual activity: Not on file  Other Topics Concern   Not on file  Social History Narrative   Not on file   Social Determinants of Health   Financial Resource Strain: Not on file  Food Insecurity: No Food Insecurity (02/28/2023)   Hunger Vital Sign    Worried About Running Out of Food in the Last Year: Never true    Ran Out of Food in the Last Year: Never true  Transportation Needs: No Transportation Needs (02/28/2023)   PRAPARE - Administrator, Civil Service (Medical): No    Lack of Transportation (Non-Medical): No  Physical Activity: Not on file  Stress: Not on file  Social Connections: Not on file  Intimate Partner Violence: Not At Risk (02/28/2023)   Humiliation, Afraid, Rape, and Kick questionnaire    Fear of Current or Ex-Partner: No    Emotionally Abused: No    Physically Abused: No    Sexually Abused: No    FAMILY HISTORY: Family History  Problem Relation Age of Onset   Allergies Sister     ALLERGIES:  is allergic to other, morphine and codeine, tree extract, and wound dressing adhesive.  MEDICATIONS:  Current Outpatient Medications  Medication Sig Dispense Refill    ALPRAZolam (XANAX) 0.5 MG tablet Take 1 tablet (0.5 mg total) by mouth 2 (two) times daily as needed for anxiety. 10 tablet 0   amLODipine (NORVASC) 10 MG tablet Take 10 mg by mouth daily.  1   apixaban (ELIQUIS) 5 MG TABS tablet Take 2 tablets (10 mg total) by mouth 2 (two) times daily for 5 days, THEN 1 tablet (5 mg total) 2 (two) times daily for 25 days. 70 tablet 0   atorvastatin (LIPITOR) 10 MG tablet Take 10 mg by mouth daily.     feeding supplement (ENSURE ENLIVE / ENSURE PLUS) LIQD Take 237 mLs by mouth 2 (two) times daily between meals. 14220 mL 0   fentaNYL (DURAGESIC) 12 MCG/HR Place 1 patch onto the skin every 3 (three) days. 10 patch 0   HYDROmorphone (DILAUDID) 4 MG tablet Take 1 tablet (4 mg total) by mouth every 4 (four) hours as needed for severe pain (pain score 7-10). 90 tablet 0   insulin aspart (NOVOLOG) 100 UNIT/ML FlexPen Inject 5 Units into the skin 3 (three) times daily with meals. If eating and Blood Glucose (BG) 80 or higher inject 5 units for meal coverage and add correction dose per scale. If not eating, correction dose only. BG <150= 0 unit; BG 150-200= 1 unit; BG 201-250= 2 unit; BG 251-300= 3 unit; BG 301-350= 4 unit; BG 351-400= 5 unit; BG >400= 6 unit and Call Primary care. 15 mL 0   JARDIANCE 25 MG TABS tablet Take 25 mg by mouth daily.     lidocaine-prilocaine (EMLA) cream Apply on the port. 30 -45 min  prior to port access. 30 g 3   losartan (COZAAR) 100 MG tablet Take 100 mg by mouth daily.  1   metoprolol tartrate (LOPRESSOR) 50 MG tablet Take 1 tablet (50 mg total) by mouth 2 (two) times daily. 60 tablet 0   mometasone-formoterol (DULERA) 100-5 MCG/ACT AERO Inhale 2 puffs into the lungs 2 (two) times daily as needed for wheezing or shortness of breath.     naloxone (NARCAN) nasal spray 4 mg/0.1 mL One spray in nostril for unresponsiveness 2 each 0   OLANZapine (ZYPREXA) 5 MG tablet Take 1  tablet (5 mg total) by mouth at bedtime. 30 tablet 3   Omega-3 Fatty Acids  (FISH OIL) 1000 MG CAPS Take 1,000 mg by mouth daily.     ondansetron (ZOFRAN) 8 MG tablet One pill every 8 hours as needed for nausea/vomitting. 40 tablet 1   prochlorperazine (COMPAZINE) 10 MG tablet Take 1 tablet (10 mg total) by mouth every 6 (six) hours as needed for nausea or vomiting. 40 tablet 1   acetaminophen (TYLENOL) 500 MG tablet Take 500 mg by mouth every 6 (six) hours as needed.     HYDROmorphone (DILAUDID) 2 MG tablet Take 2 tablets (4 mg total) by mouth every 4 (four) hours as needed for severe pain (pain score 7-10). 180 tablet 0   No current facility-administered medications for this visit.   Facility-Administered Medications Ordered in Other Visits  Medication Dose Route Frequency Provider Last Rate Last Admin   0.9 %  sodium chloride infusion   Intravenous Continuous Earna Coder, MD 10 mL/hr at 03/26/23 0941 New Bag at 03/26/23 0941   gemcitabine (GEMZAR) 1,824 mg in sodium chloride 0.9 % 250 mL chemo infusion  1,000 mg/m2 (Treatment Plan Recorded) Intravenous Once Earna Coder, MD       PACLitaxel-protein bound (ABRAXANE) chemo infusion 200 mg  100 mg/m2 (Treatment Plan Recorded) Intravenous Once Earna Coder, MD        PHYSICAL EXAMINATION:   Vitals:   03/26/23 0832  BP: 97/66  Pulse: 94  Resp: 18  Temp: 98.2 F (36.8 C)  SpO2: 100%     Filed Weights   03/26/23 0832  Weight: 149 lb 3.2 oz (67.7 kg)      Physical Exam Vitals and nursing note reviewed.  HENT:     Head: Normocephalic and atraumatic.     Mouth/Throat:     Pharynx: Oropharynx is clear.  Eyes:     Extraocular Movements: Extraocular movements intact.     Pupils: Pupils are equal, round, and reactive to light.  Cardiovascular:     Rate and Rhythm: Normal rate and regular rhythm.  Pulmonary:     Comments: Decreased breath sounds bilaterally.  Abdominal:     Palpations: Abdomen is soft.  Musculoskeletal:        General: Normal range of motion.     Cervical  back: Normal range of motion.  Skin:    General: Skin is warm.  Neurological:     General: No focal deficit present.     Mental Status: He is alert and oriented to person, place, and time.  Psychiatric:        Behavior: Behavior normal.        Judgment: Judgment normal.     LABORATORY DATA:  I have reviewed the data as listed Lab Results  Component Value Date   WBC 17.2 (H) 03/26/2023   HGB 8.4 (L) 03/26/2023   HCT 27.6 (L) 03/26/2023   MCV 86.5 03/26/2023   PLT 567 (H) 03/26/2023   Recent Labs    03/07/23 0248 03/12/23 0846 03/20/23 1349 03/24/23 1326 03/26/23 0813  NA  --    < > 137 137 136  K  --    < > 4.4 4.7 4.6  CL  --    < > 105 103 105  CO2  --    < > 24 25 25   GLUCOSE  --    < > 258* 285* 273*  BUN  --    < > 40* 26* 19  CREATININE  --    < > 0.71 0.71 0.52*  CALCIUM  --    < > 9.7 10.1 10.0  GFRNONAA  --    < > >60 >60 >60  PROT 6.5   < > 6.7 6.8 6.3*  ALBUMIN 2.0*   < > 2.1* 2.3* 2.2*  AST 31   < > 34 25 22  ALT 37   < > 27 23 19   ALKPHOS 463*   < > 778* 666* 591*  BILITOT 0.5   < > 0.5 1.2* 0.7  BILIDIR 0.1  --   --   --   --   IBILI 0.4  --   --   --   --    < > = values in this interval not displayed.    RADIOGRAPHIC STUDIES: I have personally reviewed the radiological images as listed and agreed with the findings in the report. US Venous Img Lower Bilateral  Result Date: 03/25/2023 CLINICAL DATA:  Follow-up lower extremity DVT. History of prostate cancer. Patient is on anticoagulation. EXAM: BILATERAL LOWER EXTREMITY VENOUS DOPPLER ULTRASOUND TECHNIQUE: Gray-scale sonography with graded compression, as well as color Doppler and duplex ultrasound were performed to evaluate the lower extremity deep venous systems from the level of the common femoral vein and including the common femoral, femoral, profunda femoral, popliteal and calf veins including the posterior tibial, peroneal and gastrocnemius veins when visible. The superficial great saphenous  vein was also interrogated. Spectral Doppler was utilized to evaluate flow at rest and with distal augmentation maneuvers in the common femoral, femoral and popliteal veins. COMPARISON:  Bilateral lower extremity venous Doppler ultrasound-02/26/2023 (positive for DVT extending from right femoral vein through right tibial veins as well as the left popliteal vein through the left tibial veins) FINDINGS: RIGHT LOWER EXTREMITY Common Femoral Vein: No evidence of thrombus. Normal compressibility, respiratory phasicity and response to augmentation. Saphenofemoral Junction: No evidence of thrombus. Normal compressibility and flow on color Doppler imaging. Profunda Femoral Vein: No evidence of thrombus. Normal compressibility and flow on color Doppler imaging. Femoral Vein: There is predominantly occlusive DVT involving the proximal (image 41), mid (image 44) and distal (image 47) aspects of the right femoral vein, grossly unchanged compared to the 02/26/2023 examination. Popliteal Vein: Redemonstrated near occlusive DVT involving the right popliteal vein (images 47 and 50), similar to the 02/26/2023 examination. Calf Veins: Redemonstrated occlusive DVT involving both paired divisions of the left posterior tibial (image 56) as well as both paired divisions of the right peroneal veins (image 58). Superficial Great Saphenous Vein: No evidence of thrombus. Normal compressibility. Other Findings:  None. _________________________________________________________ LEFT LOWER EXTREMITY Common Femoral Vein: No evidence of thrombus. Normal compressibility, respiratory phasicity and response to augmentation. Saphenofemoral Junction: No evidence of thrombus. Normal compressibility and flow on color Doppler imaging. Profunda Femoral Vein: No evidence of thrombus. Normal compressibility and flow on color Doppler imaging. Femoral Vein: No evidence of thrombus. Normal compressibility, respiratory phasicity and response to augmentation.  Popliteal Vein: Redemonstrated occlusive DVT involving the left popliteal vein (images 21 and 25), unchanged compared to the 02/26/2023 examination. Calf Veins: Redemonstrated occlusive DVT involving both paired divisions of the left posterior tibial vein (images 27 and 28), unchanged compared to the 02/26/2023 examination. The left peroneal veins appear patent where imaged (image 30), improved compared to the 02/26/2023 examination. Superficial Great Saphenous Vein: No evidence of thrombus. Normal compressibility. Other Findings:  None. IMPRESSION: 1. Redemonstrated bilateral lower extremity DVT extending from the right femoral vein  through the right calf veins, grossly unchanged compared to the 02/26/2023 examination. 2. Resolved left peroneal DVT. 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 Electronically Signed   By: Simonne Come M.D.   On: 03/25/2023 12:10   IR IMAGING GUIDED PORT INSERTION  Result Date: 03/24/2023 CLINICAL DATA:  Metastatic pancreas CANCER TO THE LIVER EXAM: RIGHT INTERNAL JUGULAR SINGLE LUMEN POWER PORT CATHETER INSERTION Date:  03/24/2023 03/24/2023 11:55 am Radiologist:  M. Ruel Favors, MD Guidance:  Ultrasound and fluoroscopic MEDICATIONS: 1% lidocaine local with epinephrine ANESTHESIA/SEDATION: Versed 0.5 mg IV; Fentanyl 25 mcg IV; Moderate Sedation Time:  30 minutes The patient was continuously monitored during the procedure by the interventional radiology nurse under my direct supervision. FLUOROSCOPY: 0 minutes, 71 seconds (3.4 mGy) COMPLICATIONS: None immediate. CONTRAST:  None. PROCEDURE: Informed consent was obtained from the patient following explanation of the procedure, risks, benefits and alternatives. The patient understands, agrees and consents for the procedure. All questions were addressed. A time out was performed. Maximal barrier sterile technique  utilized including caps, mask, sterile gowns, sterile gloves, large sterile drape, hand hygiene, and 2% chlorhexidine scrub. Under sterile conditions and local anesthesia, right internal jugular micropuncture venous access was performed. Access was performed with ultrasound. Images were obtained for documentation of the patent right internal jugular vein. A guide wire was inserted followed by a transitional dilator. This allowed insertion of a guide wire and catheter into the IVC. Measurements were obtained from the SVC / RA junction back to the right IJ venotomy site. In the right infraclavicular chest, a subcutaneous pocket was created over the second anterior rib. This was done under sterile conditions and local anesthesia. 1% lidocaine with epinephrine was utilized for this. A 2.5 cm incision was made in the skin. Blunt dissection was performed to create a subcutaneous pocket over the right pectoralis major muscle. The pocket was flushed with saline vigorously. There was adequate hemostasis. The port catheter was assembled and checked for leakage. The port catheter was secured in the pocket with two retention sutures. The tubing was tunneled subcutaneously to the right venotomy site and inserted into the SVC/RA junction through a valved peel-away sheath. Position was confirmed with fluoroscopy. Images were obtained for documentation. The patient tolerated the procedure well. No immediate complications. Incisions were closed in a two layer fashion with 4 - 0 Vicryl suture. Dermabond was applied to the skin. The port catheter was accessed, blood was aspirated followed by saline and heparin flushes. Needle was removed. A dry sterile dressing was applied. IMPRESSION: Ultrasound and fluoroscopically guided right internal jugular single lumen power port catheter insertion. Tip in the SVC/RA junction. Catheter ready for use. Electronically Signed   By: Judie Petit.  Shick M.D.   On: 03/24/2023 12:17   DG Chest Port 1  View  Result Date: 03/05/2023 CLINICAL DATA:  Shortness of breath.  Follow-up exam. EXAM: PORTABLE CHEST 1 VIEW COMPARISON:  Chest radiograph dated 02/27/2023. FINDINGS: Small right pleural effusion. Right lung base opacity as well as faint clusters of ground-glass density involving the right mid to lower lung field consistent with pneumonia. Overall interval worsening of pulmonary infiltrate since the radiograph of 02/27/2023. The left lung is clear. No pneumothorax. Stable cardiac silhouette. No acute osseous pathology. IMPRESSION: Interval worsening of right lung pneumonia. Electronically Signed   By: Elgie Collard M.D.   On: 03/05/2023 23:52   ECHOCARDIOGRAM LIMITED  Result Date:  03/04/2023    ECHOCARDIOGRAM LIMITED REPORT   Patient Name:   Tony Benson Date of Exam: 03/04/2023 Medical Rec #:  [id number]     Height:       69.0 in Accession #:    [id number]    Weight:       148.8 lb Date of Birth:  Jan 08, 1961    BSA:          1.822 m Patient Age:    61 years      BP:           117/60 mmHg Patient Gender: M             HR:           112 bpm. Exam Location:  [facility] Procedure: Limited Echo, Color Doppler and Cardiac Doppler Indications:     Pericardial Effusion I31.3  History:         Patient has prior history of Echocardiogram examinations, most                  recent 03/01/2023. Risk Factors:Diabetes, Hypertension and                  Sleep Apnea.  Sonographer:     Cristela Blue Referring Phys:  [id number] Marrion Coy Diagnosing Phys: Yvonne Kendall MD IMPRESSIONS  1. Left ventricular ejection fraction, by estimation, is >55%. The left ventricle has normal function. The left ventricle has no regional wall motion abnormalities.  2. Right ventricular systolic function is normal. The right ventricular size is normal.  3. Moderate pericardial effusion. The pericardial effusion is circumferential. There is no evidence of cardiac tamponade.  4. The mitral valve is normal in structure. Comparison(s): A prior study  was performed on 03/01/2023. Pericardial effusion appears slightly smaller. FINDINGS  Left Ventricle: Left ventricular ejection fraction, by estimation, is >55%. The left ventricle has normal function. The left ventricle has no regional wall motion abnormalities. The left ventricular internal cavity size was normal in size. There is borderline left ventricular hypertrophy. Right Ventricle: The right ventricular size is normal. No increase in right ventricular wall thickness. Right ventricular systolic function is normal. Pericardium: A moderately sized pericardial effusion is present. The pericardial effusion is circumferential. There is no evidence of cardiac tamponade. Mitral Valve: The mitral valve is normal in structure. Tricuspid Valve: The tricuspid valve is grossly normal. Tricuspid valve regurgitation is mild. Aorta: The aortic root is normal in size and structure. Venous: The inferior vena cava was not well visualized. Additional Comments: Spectral Doppler performed. Color Doppler performed.  LEFT VENTRICLE PLAX 2D LVIDd:         4.30 cm LVIDs:         2.30 cm LV PW:         1.12 cm LV IVS:        0.95 cm   AORTA Ao Root diam: 3.30 cm Yvonne Kendall MD Electronically signed by Yvonne Kendall MD Signature Date/Time: 03/04/2023/11:43:49 AM    Final    IR US LIVER BIOPSY  Result Date: 03/03/2023 INDICATION: 62 year old male with pancreatic mass and concern for hepatic metastatic disease. EXAM: LIVER CORE BIOPSY MEDICATIONS: None. ANESTHESIA/SEDATION: Moderate (conscious) sedation was employed during this procedure. A total of Versed 1 mg and Fentanyl 50 mcg was administered intravenously. Moderate Sedation Time: 10 minutes. The patient's level of consciousness and vital signs were monitored continuously by radiology nursing throughout the procedure under my direct supervision. FLUOROSCOPY TIME:  None. COMPLICATIONS: None immediate.  PROCEDURE: Informed written consent was obtained from the patient after  a thorough discussion of the procedural risks, benefits and alternatives. All questions were addressed. Maximal Sterile Barrier Technique was utilized including caps, mask, sterile gowns, sterile gloves, sterile drape, hand hygiene and skin antiseptic. A timeout was performed prior to the initiation of the procedure. The right upper quadrant was interrogated with ultrasound. Multiple echogenic masses present throughout the liver. A suitable skin entry site was selected and marked. The region was sterilely prepped and draped in the standard fashion using chlorhexidine skin prep. Local anesthesia was attained by infiltration with 1% lidocaine. A small dermatotomy was made. Under real-time sonographic guidance, a 17 gauge introducer needle was advanced into the margin of the mass. Multiple 18 gauge biopsies were then coaxially obtained using the BioPince automated biopsy device. Biopsy samples were placed in formalin and delivered to pathology for further analysis. As a 17 gauge introducer needle was removed, the biopsy tract was embolized with a Gel-Foam slurry. Post biopsy ultrasound imaging demonstrates no evidence of active hemorrhage or perihepatic hematoma. The patient tolerated the procedure well. IMPRESSION: Technically successful ultrasound-guided core biopsy of hepatic lesion. Electronically Signed   By: Malachy Moan M.D.   On: 03/03/2023 14:59   ECHOCARDIOGRAM COMPLETE  Result Date: 03/01/2023    ECHOCARDIOGRAM REPORT   Patient Name:   Tony Benson Date of Exam: 03/01/2023 Medical Rec #:  [id number]     Height:       69.0 in Accession #:    [id number]    Weight:       149.9 lb Date of Birth:  [birth year]    BSA:          1.828 m Patient Age:    61 years      BP:           138/75 mmHg Patient Gender: M             HR:           116 bpm. Exam Location:  [facility] Procedure: 2D Echo Indications:     Pulmonary Embolus I26.09  History:         Patient has no prior history of Echocardiogram examinations.   Sonographer:     Overton Mam RDCS, FASE Referring Phys:  [id number] Vernetta Honey MANSY Diagnosing Phys: Julien Nordmann MD IMPRESSIONS  1. Left ventricular ejection fraction, by estimation, is 60 to 65%. The left ventricle has normal function. The left ventricle has no regional wall motion abnormalities. Left ventricular diastolic parameters are indeterminate.  2. Right ventricular systolic function is normal. The right ventricular size is normal. There is moderately elevated pulmonary artery systolic pressure. The estimated right ventricular systolic pressure is 54.0 mmHg.  3. Large pericardial effusion. The pericardial effusion is circumferential. There is no evidence of cardiac tamponade. 1.95 off the RV free wall, 2.16 cm off the apical region, 1.70 cm off the LV free wall. IVC is not dilated at 1.16 cm  4. The mitral valve is normal in structure. No evidence of mitral valve regurgitation. No evidence of mitral stenosis.  5. The aortic valve is tricuspid. Aortic valve regurgitation is not visualized. No aortic stenosis is present.  6. The inferior vena cava is normal in size with greater than 50% respiratory variability, suggesting right atrial pressure of 3 mmHg. FINDINGS  Left Ventricle: Left ventricular ejection fraction, by estimation, is 60 to 65%. The left ventricle has normal function. The left ventricle has no regional wall motion  abnormalities. The left ventricular internal cavity size was normal in size. There is  no left ventricular hypertrophy. Left ventricular diastolic parameters are indeterminate. Right Ventricle: The right ventricular size is normal. No increase in right ventricular wall thickness. Right ventricular systolic function is normal. There is moderately elevated pulmonary artery systolic pressure. The tricuspid regurgitant velocity is 3.50 m/s, and with an assumed right atrial pressure of 5 mmHg, the estimated right ventricular systolic pressure is 54.0 mmHg. Left Atrium: Left atrial size  was normal in size. Right Atrium: Right atrial size was normal in size. Pericardium: A large pericardial effusion is present. The pericardial effusion is circumferential. There is no evidence of cardiac tamponade. Mitral Valve: The mitral valve is normal in structure. No evidence of mitral valve regurgitation. No evidence of mitral valve stenosis. Tricuspid Valve: The tricuspid valve is normal in structure. Tricuspid valve regurgitation is mild . No evidence of tricuspid stenosis. Aortic Valve: The aortic valve is tricuspid. Aortic valve regurgitation is not visualized. No aortic stenosis is present. Aortic valve peak gradient measures 12.0 mmHg. Pulmonic Valve: The pulmonic valve was normal in structure. Pulmonic valve regurgitation is not visualized. No evidence of pulmonic stenosis. Aorta: The aortic root is normal in size and structure. Venous: The inferior vena cava is normal in size with greater than 50% respiratory variability, suggesting right atrial pressure of 3 mmHg. IAS/Shunts: No atrial level shunt detected by color flow Doppler.  LEFT VENTRICLE PLAX 2D LVIDd:         4.60 cm   Diastology LVIDs:         2.90 cm   LV e' medial:    11.70 cm/s LV PW:         1.00 cm   LV E/e' medial:  8.8 LV IVS:        1.00 cm   LV e' lateral:   18.60 cm/s LVOT diam:     2.10 cm   LV E/e' lateral: 5.5 LV SV:         67 LV SV Index:   37 LVOT Area:     3.46 cm  RIGHT VENTRICLE RV Basal diam:  2.50 cm RV S prime:     22.30 cm/s TAPSE (M-mode): 1.8 cm LEFT ATRIUM             Index        RIGHT ATRIUM          Index LA diam:        2.70 cm 1.48 cm/m   RA Area:     7.05 cm LA Vol (A2C):   45.8 ml 25.06 ml/m  RA Volume:   9.68 ml  5.30 ml/m LA Vol (A4C):   25.5 ml 13.95 ml/m LA Biplane Vol: 35.4 ml 19.37 ml/m  AORTIC VALVE                 PULMONIC VALVE AV Area (Vmax): 2.46 cm     PV Vmax:        1.10 m/s AV Vmax:        173.00 cm/s  PV Peak grad:   4.8 mmHg AV Peak Grad:   12.0 mmHg    RVOT Peak grad: 4 mmHg LVOT Vmax:       123.00 cm/s LVOT Vmean:     79.500 cm/s LVOT VTI:       0.193 m  AORTA  PULMONARY ARTERY Ao Root diam: 3.50 cm        MPA diam:        3.00 cm Ao Asc diam:  2.70 cm MITRAL VALVE                TRICUSPID VALVE MV Area (PHT): 7.22 cm     TR Peak grad:   49.0 mmHg MV Decel Time: 105 msec     TR Vmax:        350.00 cm/s MV E velocity: 103.00 cm/s MV A velocity: 87.50 cm/s   SHUNTS MV E/A ratio:  1.18         Systemic VTI:  0.19 m                             Systemic Diam: 2.10 cm Julien Nordmann MD Electronically signed by Julien Nordmann MD Signature Date/Time: 03/01/2023/3:02:54 PM    Final    MR LIVER W WO CONTRAST  Result Date: 03/01/2023 CLINICAL DATA:  62 year old male with history of shortness of breath recently diagnosed with blood clots in the legs. Hepatic masses identified on recent chest CTA. Further evaluation to exclude the possibility of malignancy. EXAM: MRI ABDOMEN WITHOUT AND WITH CONTRAST TECHNIQUE: Multiplanar multisequence MR imaging of the abdomen was performed both before and after the administration of intravenous contrast. CONTRAST:  7mL GADAVIST GADOBUTROL 1 MMOL/ML IV SOLN COMPARISON:  No prior abdominal MRI.  Chest CTA 02/27/2023. FINDINGS: Comment: Portions of today's examination are limited by considerable patient respiratory motion. Lower chest: Small right pleural effusion with areas of atelectasis and/or scarring in the right lung base. Hepatobiliary: There are multiple hepatic lesions highly concerning for widespread metastatic disease to the liver. The largest of these is a dominant lesion in the central aspect of the liver, predominantly centered in segment 8 (axial image 15 of series 4 and coronal image 22 series 3) measuring up to 13.7 x 9.6 x 12.1 cm. These lesions are heterogeneous in signal intensity but generally T1 hypointense, slightly T2 hyperintense, hypovascular with predominantly peripheral enhancement which increases slightly on delayed  images, and extensive internal diffusion restriction. No definite hypervascular hepatic lesion is identified. Gallbladder is not visualized, likely surgically absent. No intra or extrahepatic biliary ductal dilatation. Pancreas: In the region of the pancreatic head (best appreciated on axial image 55 of series 14 and coronal image 45 of series 20) there is what appears to be a poorly defined hypovascular mass estimated to measure approximately 4.0 x 2.6 x 2.9 cm, which is located immediately anterior and lateral to the superior mesenteric vein adjacent to the splenic pulmonary confluence, and inferior to the proximal portal vein, with minimal obscuration of the intervening fat plane adjacent to the superior mesenteric vein. This lesion appears to obstruct the main pancreatic duct (axial image 49 of series 14), proximal to which the main pancreatic duct is severely dilated measuring up to 12 mm in the body of the pancreas. Diffuse atrophy throughout the body and tail of the pancreas is noted. 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 Spleen:  Unremarkable. Adrenals/Urinary Tract: Multiple T1 hypointense, T2 hyperintense, nonenhancing lesions in the kidneys are compatible with simple cysts (Bosniak class 1), which require no imaging follow-up, largest  of which measure up to approximately 2.2 cm in diameter in the lower pole of the left kidney. No aggressive appearing renal lesions. No hydroureteronephrosis in the visualized portions of the abdomen. Bilateral adrenal glands are normal in appearance. Stomach/Bowel: Visualized portions are unremarkable. Vascular/Lymphatic: No aneurysm identified in the visualized abdominal vasculature. No definite  lymphadenopathy confidently identified in the abdomen. Other:  Trace volume of perihepatic ascites. Musculoskeletal: No aggressive appearing osseous lesions are noted in the visualized portions of the skeleton. IMPRESSION: 1. Large hypovascular mass in the region of the head of the pancreas causing occlusion of the main pancreatic duct, highly concerning for primary pancreatic neoplasm. Numerous hypovascular lesions scattered throughout the liver, presumably reflective of widespread metastatic disease to the liver, with the largest lesion a dominant mass centered in the right lobe of the liver measuring up to 13.7 x 9.6 x 12.1 cm. 2. Trace volume of perihepatic ascites. 3. Fluid collection anterior to the mass adjacent to the head of the pancreas, presumably a proteinaceous/hemorrhagic pancreatic pseudocyst. 4. Small right pleural effusion. 5. Additional incidental findings, as above. Electronically Signed   By: Trudie Reed M.D.   On: 03/01/2023 05:35   CT Angio Chest PE W and/or Wo Contrast  Result Date: 02/27/2023 CLINICAL DATA:  Shortness of breath blood clots in leg EXAM: CT ANGIOGRAPHY CHEST WITH CONTRAST TECHNIQUE: Multidetector CT imaging of the chest was performed using the standard protocol during bolus administration of intravenous contrast. Multiplanar CT image reconstructions and MIPs were obtained to evaluate the vascular anatomy. RADIATION DOSE REDUCTION: This exam was performed according to the departmental dose-optimization program which includes automated exposure control, adjustment of the mA and/or kV according to patient size and/or use of iterative reconstruction technique. CONTRAST:  75mL OMNIPAQUE IOHEXOL 350 MG/ML SOLN COMPARISON:  Chest x-ray 02/27/2023 FINDINGS: Cardiovascular: Satisfactory opacification of the pulmonary arteries to the segmental level. Small nonocclusive segmental/subsegmental embolus within the left upper lobe, series 6, image 168. No other discrete emboli are  visualized. Mild atherosclerosis. No aneurysm or dissection. Normal cardiac size. Moderate to large circumferential pericardial effusion measuring up to 2.1 cm, slight increased density values suggesting complex fluid. Mediastinum/Nodes: Midline trachea. No thyroid mass. Subcarinal nodes measuring at least up to 2 cm. Esophagus within normal limits. Small clips in the posterior mediastinum. Lungs/Pleura: Small circumferential right pleural effusion. Calcifications or sutures at the posterior right lung base. Prominent peribronchovascular thickening within the right perihilar region and within the right middle and lower lobe bronchi. Multifocal bilateral ground-glass densities and areas of tree-in-bud density suspicious for respiratory infection Upper Abdomen: Large hypodense right hepatic mass suspected, this measures at least 12.6 cm. Another hypodense inferior right hepatic lobe lesion measuring 2.8 cm on series 4, image 153 incompletely assessed. Musculoskeletal: No acute osseous abnormality. Review of the MIP images confirms the above findings. IMPRESSION: 1. Positive for small nonocclusive segmental/subsegmental pulmonary embolus within the left upper lobe. 2. Moderate to large circumferential pericardial effusion measuring up to 2.1 cm, slight increased density values suggesting complex fluid. Recommend correlation with echocardiogram 3. Small circumferential right pleural effusion. Prominent peribronchovascular thickening within the right perihilar region and within the right upper, middle and lower lobe bronchi, with mild mucous plugging in the right lower lobe. Multifocal bilateral ground-glass densities and areas of tree-in-bud density suspicious for respiratory infection/pneumonia, possible atypical infection. 4. Subcarinal adenopathy, reactive versus metastatic. 5. Right hepatic masses measuring up to 12.6 cm. Findings are suspicious for metastatic disease versus liver carcinoma. When the patient is  clinically  stable and able to follow directions and hold their breath (preferably as an outpatient) further evaluation with dedicated abdominal MRI should be considered. Critical Value/emergent results were called by telephone at the time of interpretation on 02/27/2023 at 11:02 pm to provider [facility] , who verbally acknowledged these results. Electronically Signed   By: Jasmine Pang M.D.   On: 02/27/2023 23:04   DG Chest 2 View  Result Date: 02/27/2023 CLINICAL DATA:  Shortness of breath EXAM: CHEST - 2 VIEW COMPARISON:  08/04/2013 FINDINGS: Left lung is grossly clear. Normal cardiac size. Small loculated pleural effusion versus pleural thickening. Airspace disease at the right middle lobe and right base. No pneumothorax IMPRESSION: Airspace disease at the right middle lobe and right base, possible pneumonia. Small loculated pleural effusion versus pleural thickening. Electronically Signed   By: Jasmine Pang M.D.   On: 02/27/2023 22:45   US Venous Img Lower Bilateral (DVT)  Result Date: 02/26/2023 CLINICAL DATA:  Leg swelling for 2 weeks EXAM: BILATERAL LOWER EXTREMITY VENOUS DOPPLER ULTRASOUND TECHNIQUE: Gray-scale sonography with graded compression, as well as color Doppler and duplex ultrasound were performed to evaluate the lower extremity deep venous systems from the level of the common femoral vein and including the common femoral, femoral, profunda femoral, popliteal and calf veins including the posterior tibial, peroneal and gastrocnemius veins when visible. The superficial great saphenous vein was also interrogated. Spectral Doppler was utilized to evaluate flow at rest and with distal augmentation maneuvers in the common femoral, femoral and popliteal veins. COMPARISON:  None Available. FINDINGS: RIGHT LOWER EXTREMITY Common Femoral Vein: No evidence of thrombus. Normal compressibility, respiratory phasicity and response to augmentation. Saphenofemoral Junction: No evidence of  thrombus. Normal compressibility and flow on color Doppler imaging. Profunda Femoral Vein: No evidence of thrombus. Normal compressibility and flow on color Doppler imaging. Femoral Vein: Poor flow on Doppler. Poor compression with hypoechoic material. Popliteal Vein: Poor flow on Doppler with poor compression and luminal hypoechoic appearance Calf Veins: Areas of thrombus along the posterior tibial and peroneal vein. Superficial Great Saphenous Vein: No evidence of thrombus. Normal compressibility. Venous Reflux:  None. Other Findings:  None. LEFT LOWER EXTREMITY Common Femoral Vein: No evidence of thrombus. Normal compressibility, respiratory phasicity and response to augmentation. Saphenofemoral Junction: No evidence of thrombus. Normal compressibility and flow on color Doppler imaging. Profunda Femoral Vein: No evidence of thrombus. Normal compressibility and flow on color Doppler imaging. Femoral Vein: No evidence of thrombus. Normal compressibility, respiratory phasicity and response to augmentation. Popliteal Vein: Incomplete compression and poor flow on Doppler, color and spectral Calf Veins: Areas of thrombus in the peroneal and posterior tibial veins. Superficial Great Saphenous Vein: No evidence of thrombus. Normal compressibility. Venous Reflux:  None. Other Findings:  None. Critical Value/emergent results were called by telephone at the time of interpretation on 02/26/2023 at 6:04 pm to provider [facility] , who verbally acknowledged these results. IMPRESSION: Bilateral DVT. Right femoral vein down through the calf veins. Left popliteal and calf veins. Electronically Signed   By: Karen Kays M.D.   On: 02/26/2023 18:14     Pancreatic cancer metastasized to liver [facility]) # NOV Metastatic pancreatic head mass-with multiple-liver masses; large pericardial effusion [Dr.End]- highly suspicious for malignancy-status post liver biopsy- Pathology-pancreatico biliary origin.  CA 19-9 normal however  CEA elevated; on Gem-Abraxaneq 2 W.  # Proceed with cycle #2  of gemcitabine Abraxane. Labs-CBC/chemistries were reviewed with the patient [see below regarding leukocytosis/liver function abnormalities].   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  Status post recent antibiotics.  Monitor closely.  # Sacral ulcer- on donut- recommend hydrogel pads/ support  # Pain control- on Fentanyl patch; will check on Dilaudid BTP prn.s/p  palliative care evaluation-; give oxycond today.   # Elevated LFTs-likely secondary malignancy-no obvious signs of any biliary duct obstruction or cholangitis.  Monitor closely.   # cardio-respiratory: Acute PE/bilateral DVT-currently on Eliquis-stable.  Large pericardial effusion [Dr.End]-no cardiac tamponade; no pericardiocentesis monitor for now. Stable.   # Loss of appetite-secondary to malignancy; recommend Zyprexa/ called in. nutrition evaluation:    # port placement. IV mediport- functional   Ctcle #1- gem-ab q 2w;3rd cyle- qwx3; q4  # DISPOSITION: # Oxycodone x1 today # gem-abraxane today- labs are ok # follow up in 2 weeks- MD; labs-cbc/cmp; CEA- gem-abraxane # follow up in 4 weeks- MD; labs-cbc/cmp; CEA- gem-abraxane- Dr.B   Above plan of care was discussed with patient/family in detail.  My contact information was given to the patient/family.     Earna Coder, MD 03/26/2023 9:45 AM

## 2023-03-26 NOTE — Telephone Encounter (Signed)
Pt here to see oncology today to start chemo treatment. Per Dr. Kizzie Bane - Patient has not yet received the dilaudid script from pharmacy. Pt was told last week that pharmacy didn't have med. While waiting for pharmacy hours to open, I attempted to do a prior auth on the 4 mg to see if that was the issue. PA submitted on the 4 mg, However, when I spoke to pharmacy, the 4 mg dosing is on national backorder. I asked the pharmacy when the pharmacy would have notified our office of this. Patient is completely out of dilaudid and in uncontrolled pain. Per pharmacy, the pharmacist has the 2 mg in stock and just needs a new script.   Josh would you mind sending a script to the [facility].- script pended.

## 2023-03-26 NOTE — Progress Notes (Signed)
Very weak. No appetite. Drinking 3 protein shakes per day. Drinking plenty of water. [facility], pt is completely out of it. Wearing fentanyl patch. Has a small pressure area developing on Coccyx. Wife has a cushion pad in W/C.Examined open are between buttocks stage II. Hydrocolloid drsg applied and gave wife a couple of pads to take home.

## 2023-03-26 NOTE — Patient Instructions (Signed)
[facility] - A DEPT OF [facility]  Discharge Instructions: Thank you for choosing [facility] to provide your oncology and hematology care.  If you have a lab appointment with the Cancer Center, please go directly to the Cancer Center and check in at the registration area.  Wear comfortable clothing and clothing appropriate for easy access to any Portacath or PICC line.   We strive to give you quality time with your provider. You may need to reschedule your appointment if you arrive late (15 or more minutes).  Arriving late affects you and other patients whose appointments are after yours.  Also, if you miss three or more appointments without notifying the office, you may be dismissed from the clinic at the provider's discretion.      For prescription refill requests, have your pharmacy contact our office and allow 72 hours for refills to be completed.    Today you received the following chemotherapy and/or immunotherapy agents abraxane and gemzar   To help prevent nausea and vomiting after your treatment, we encourage you to take your nausea medication as directed.  BELOW ARE SYMPTOMS THAT SHOULD BE REPORTED IMMEDIATELY: *FEVER GREATER THAN 100.4 F (38 C) OR HIGHER *CHILLS OR SWEATING *NAUSEA AND VOMITING THAT IS NOT CONTROLLED WITH YOUR NAUSEA MEDICATION *UNUSUAL SHORTNESS OF BREATH *UNUSUAL BRUISING OR BLEEDING *URINARY PROBLEMS (pain or burning when urinating, or frequent urination) *BOWEL PROBLEMS (unusual diarrhea, constipation, pain near the anus) TENDERNESS IN MOUTH AND THROAT WITH OR WITHOUT PRESENCE OF ULCERS (sore throat, sores in mouth, or a toothache) UNUSUAL RASH, SWELLING OR PAIN  UNUSUAL VAGINAL DISCHARGE OR ITCHING   Items with * indicate a potential emergency and should be followed up as soon as possible or go to the Emergency Department if any problems should occur.  Please show the CHEMOTHERAPY ALERT CARD or  IMMUNOTHERAPY ALERT CARD at check-in to the Emergency Department and triage nurse.  Should you have questions after your visit or need to cancel or reschedule your appointment, please contact [facility] - A DEPT OF [facility]  [phone] and follow the prompts.  Office hours are 8:00 a.m. to 4:30 p.m. Monday - Friday. Please note that voicemails left after 4:00 p.m. may not be returned until the following business day.  We are closed weekends and major holidays. You have access to a nurse at all times for urgent questions. Please call the main number to the clinic [phone] and follow the prompts.  For any non-urgent questions, you may also contact your provider using MyChart. We now offer e-Visits for anyone 5 and older to request care online for non-urgent symptoms. For details visit [email or web address].   Also download the MyChart app! Go to the app store, search "MyChart", open the app, select [place], and log in with your MyChart username and password.

## 2023-03-26 NOTE — Assessment & Plan Note (Addendum)
#   NOV Metastatic pancreatic head mass-with multiple-liver masses; large pericardial effusion [Dr.End]- highly suspicious for malignancy-status post liver biopsy- Pathology-pancreatico biliary origin.  CA 19-9 normal however CEA elevated; on Gem-Abraxaneq 2 W.  # Proceed with cycle #2  of gemcitabine Abraxane. Labs-CBC/chemistries were reviewed with the patient [see below regarding leukocytosis/liver function abnormalities].   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  Status post recent antibiotics.  Monitor closely.  # Sacral ulcer- on donut- recommend hydrogel pads/ support  # Pain control- on Fentanyl patch; will check on Dilaudid BTP prn.s/p  palliative care evaluation-; give oxycond today.   # Elevated LFTs-likely secondary malignancy-no obvious signs of any biliary duct obstruction or cholangitis.  Monitor closely.   # cardio-respiratory: Acute PE/bilateral DVT-currently on Eliquis-stable.  Large pericardial effusion [Dr.End]-no cardiac tamponade; no pericardiocentesis monitor for now. Stable.   # Loss of appetite-secondary to malignancy; recommend Zyprexa/ called in. nutrition evaluation:    # port placement. IV mediport- functional   Ctcle #1- gem-ab q 2w;3rd cyle- qwx3; q4  # DISPOSITION: # Oxycodone x1 today # gem-abraxane today- labs are ok # follow up in 2 weeks- MD; labs-cbc/cmp; CEA- gem-abraxane # follow up in 4 weeks- MD; labs-cbc/cmp; CEA- gem-abraxane- Dr.B

## 2023-03-26 NOTE — Progress Notes (Signed)
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  Past medical history of DM, acute PE/DVT, HLD, HTN, prostate cancer.  Patient receiving gemcitabine and abraxane.  Hospitalized from 11/14-11/22 for pneumonia  Met with patient and wife during infusion.  Patient reports poor appetite since hospitalization in November.  Breakfast maybe 1/2 piece of cheese toast.  Lunch maybe boost or beef broth or other soup.  Supper recently was few bites of fish.  Drinking 2 premier protein/boost shakes a day.  Reports taste change prior to starting chemotherapy. Denies constipation or diarrhea.  Reports pressure ulcer on and noted nursing documentation of stage II on buttocks   Nutrition Focused Physical Exam:   Orbital Region: severe Buccal Region: severe Upper Arm Region: severe Thoracic and Lumbar Region: severe Temple Region: severe Clavicle Bone Region: severe Shoulder and Acromion Bone Region: severe Scapular Bone Region: severe Dorsal Hand: moderate Patellar Region: unable to examine Anterior Thigh Region: unable to examine Posterior Calf Region: weeping from edema Edema (RD assessment): weeping Hair: none Eyes: observed Mouth: deferred Skin: dry Nails: observed   Medications: aspart insulin,ondansetron, prochlorperazine, olanzapine (added today), jardiance   Labs: glucose 273, albumin 2.2, calcium 8.4   Anthropometrics:   Height: 70 inches Weight: 149 lb 3.2 oz UBW: 180 lb about 6 months ago per wife BMI: 21  17% weight loss in the last 6 months, significant   Estimated Energy Needs  Kcals: 2000-2380 Protein: 81-102 g Fluid: > 2 L   NUTRITION DIAGNOSIS: Inadequate oral intake related to cancer, lack of taste as evidenced by 17% weight loss in the last 6 months, severe muscle mass loss and fat mass, and eating less than 75% of  estimated energy needs for > 1 month   MALNUTRITION DIAGNOSIS: Patient meets criteria for severe malnutrition in context of chronic illness as evidenced by 17% weight loss in 6 months, severe muscle and fat mass loss and eating less than 75% of estimated energy needs for > 1 month   INTERVENTION:  Encouraged small frequent snacks/nibbles q 2 hours.  Eat by the clock not hunger cues Continue oral nutrition supplements. Lower sugar shakes at this time as blood glucose not under control.  Wife will reach out to PCP. Hopeful olanzapine will increase appetite Discussed foods high in protein to help with edema in legs and pressure ulcer Could benefit from [facility]VI Contact information provided   MONITORING, EVALUATION, GOAL: weight trends, intake   Next Visit: Tuesday, Dec 24 during infusion  Charmane Protzman B. Freida Busman, RD, LDN Registered Dietitian [phone]

## 2023-03-26 NOTE — Telephone Encounter (Signed)
Form copied for chart and message sent to patient that it is ready for pick up

## 2023-03-27 ENCOUNTER — Telehealth: Payer: Self-pay | Admitting: *Deleted

## 2023-03-27 ENCOUNTER — Encounter: Payer: Self-pay | Admitting: *Deleted

## 2023-03-27 ENCOUNTER — Other Ambulatory Visit: Payer: Self-pay | Admitting: Internal Medicine

## 2023-03-27 NOTE — Telephone Encounter (Addendum)
[employer] states that they are missing clinicals to completed the Urgent PA for Hydromorphone request on this patient and ask it be faxed to [phone] or call the number listed on this note

## 2023-03-28 LAB — CEA: CEA: 127 ng/mL — ABNORMAL HIGH (ref 0.0–4.7)

## 2023-03-31 ENCOUNTER — Encounter: Payer: Self-pay | Admitting: Internal Medicine

## 2023-03-31 ENCOUNTER — Inpatient Hospital Stay: Payer: BC Managed Care – PPO

## 2023-03-31 ENCOUNTER — Inpatient Hospital Stay
Admission: EM | Admit: 2023-03-31 | Discharge: 2023-04-05 | DRG: 435 | Disposition: A | Discharge status: Home Health | Payer: BC Managed Care – PPO | Attending: Internal Medicine | Admitting: Internal Medicine

## 2023-03-31 ENCOUNTER — Emergency Department: Payer: BC Managed Care – PPO

## 2023-03-31 ENCOUNTER — Other Ambulatory Visit: Payer: Self-pay | Admitting: *Deleted

## 2023-03-31 ENCOUNTER — Other Ambulatory Visit: Payer: Self-pay

## 2023-03-31 DIAGNOSIS — J189 Pneumonia, unspecified organism: Secondary | ICD-10-CM | POA: Diagnosis present

## 2023-03-31 DIAGNOSIS — Z9102 Food additives allergy status: Secondary | ICD-10-CM

## 2023-03-31 DIAGNOSIS — I3139 Other pericardial effusion (noninflammatory): Secondary | ICD-10-CM | POA: Diagnosis present

## 2023-03-31 DIAGNOSIS — C259 Malignant neoplasm of pancreas, unspecified: Secondary | ICD-10-CM | POA: Diagnosis present

## 2023-03-31 DIAGNOSIS — R7989 Other specified abnormal findings of blood chemistry: Secondary | ICD-10-CM | POA: Diagnosis present

## 2023-03-31 DIAGNOSIS — R042 Hemoptysis: Principal | ICD-10-CM

## 2023-03-31 DIAGNOSIS — C250 Malignant neoplasm of head of pancreas: Secondary | ICD-10-CM | POA: Diagnosis not present

## 2023-03-31 DIAGNOSIS — Z7984 Long term (current) use of oral hypoglycemic drugs: Secondary | ICD-10-CM

## 2023-03-31 DIAGNOSIS — R0600 Dyspnea, unspecified: Secondary | ICD-10-CM | POA: Diagnosis not present

## 2023-03-31 DIAGNOSIS — Z79899 Other long term (current) drug therapy: Secondary | ICD-10-CM

## 2023-03-31 DIAGNOSIS — Z1152 Encounter for screening for COVID-19: Secondary | ICD-10-CM

## 2023-03-31 DIAGNOSIS — C787 Secondary malignant neoplasm of liver and intrahepatic bile duct: Secondary | ICD-10-CM | POA: Diagnosis present

## 2023-03-31 DIAGNOSIS — Z86711 Personal history of pulmonary embolism: Secondary | ICD-10-CM

## 2023-03-31 DIAGNOSIS — G8929 Other chronic pain: Secondary | ICD-10-CM | POA: Diagnosis present

## 2023-03-31 DIAGNOSIS — D6481 Anemia due to antineoplastic chemotherapy: Secondary | ICD-10-CM | POA: Diagnosis present

## 2023-03-31 DIAGNOSIS — D649 Anemia, unspecified: Secondary | ICD-10-CM | POA: Diagnosis present

## 2023-03-31 DIAGNOSIS — G4733 Obstructive sleep apnea (adult) (pediatric): Secondary | ICD-10-CM | POA: Diagnosis present

## 2023-03-31 DIAGNOSIS — Z86718 Personal history of other venous thrombosis and embolism: Secondary | ICD-10-CM

## 2023-03-31 DIAGNOSIS — L8990 Pressure ulcer of unspecified site, unspecified stage: Secondary | ICD-10-CM | POA: Insufficient documentation

## 2023-03-31 DIAGNOSIS — Z8546 Personal history of malignant neoplasm of prostate: Secondary | ICD-10-CM

## 2023-03-31 DIAGNOSIS — Z91010 Allergy to peanuts: Secondary | ICD-10-CM

## 2023-03-31 DIAGNOSIS — Z7901 Long term (current) use of anticoagulants: Secondary | ICD-10-CM

## 2023-03-31 DIAGNOSIS — T451X5A Adverse effect of antineoplastic and immunosuppressive drugs, initial encounter: Secondary | ICD-10-CM | POA: Diagnosis present

## 2023-03-31 DIAGNOSIS — Z794 Long term (current) use of insulin: Secondary | ICD-10-CM

## 2023-03-31 DIAGNOSIS — R54 Age-related physical debility: Secondary | ICD-10-CM | POA: Diagnosis present

## 2023-03-31 DIAGNOSIS — L89152 Pressure ulcer of sacral region, stage 2: Secondary | ICD-10-CM | POA: Diagnosis present

## 2023-03-31 DIAGNOSIS — R1310 Dysphagia, unspecified: Secondary | ICD-10-CM | POA: Diagnosis present

## 2023-03-31 DIAGNOSIS — Z885 Allergy status to narcotic agent status: Secondary | ICD-10-CM

## 2023-03-31 DIAGNOSIS — E785 Hyperlipidemia, unspecified: Secondary | ICD-10-CM | POA: Diagnosis present

## 2023-03-31 DIAGNOSIS — C7802 Secondary malignant neoplasm of left lung: Secondary | ICD-10-CM | POA: Diagnosis present

## 2023-03-31 DIAGNOSIS — J45909 Unspecified asthma, uncomplicated: Secondary | ICD-10-CM | POA: Diagnosis present

## 2023-03-31 DIAGNOSIS — E119 Type 2 diabetes mellitus without complications: Secondary | ICD-10-CM | POA: Diagnosis present

## 2023-03-31 DIAGNOSIS — I3131 Malignant pericardial effusion in diseases classified elsewhere: Secondary | ICD-10-CM | POA: Diagnosis present

## 2023-03-31 DIAGNOSIS — I10 Essential (primary) hypertension: Secondary | ICD-10-CM | POA: Diagnosis present

## 2023-03-31 DIAGNOSIS — Z7951 Long term (current) use of inhaled steroids: Secondary | ICD-10-CM

## 2023-03-31 DIAGNOSIS — Z91048 Other nonmedicinal substance allergy status: Secondary | ICD-10-CM

## 2023-03-31 DIAGNOSIS — J910 Malignant pleural effusion: Secondary | ICD-10-CM | POA: Diagnosis present

## 2023-03-31 DIAGNOSIS — Z515 Encounter for palliative care: Secondary | ICD-10-CM

## 2023-03-31 LAB — COMPREHENSIVE METABOLIC PANEL
ALT: 28 U/L (ref 0–44)
AST: 23 U/L (ref 15–41)
Albumin: 2.1 g/dL — ABNORMAL LOW (ref 3.5–5.0)
Alkaline Phosphatase: 670 U/L — ABNORMAL HIGH (ref 38–126)
Anion gap: 11 (ref 5–15)
BUN: 31 mg/dL — ABNORMAL HIGH (ref 8–23)
CO2: 19 mmol/L — ABNORMAL LOW (ref 22–32)
Calcium: 9 mg/dL (ref 8.9–10.3)
Chloride: 109 mmol/L (ref 98–111)
Creatinine, Ser: 0.76 mg/dL (ref 0.61–1.24)
GFR, Estimated: >60 mL/min (ref >60)
Glucose, Bld: 198 mg/dL — ABNORMAL HIGH (ref 70–99)
Potassium: 4.5 mmol/L (ref 3.5–5.1)
Sodium: 139 mmol/L (ref 135–145)
Total Bilirubin: 1.2 mg/dL — ABNORMAL HIGH (ref <1.2)
Total Protein: 6.5 g/dL (ref 6.5–8.1)

## 2023-03-31 LAB — CBC WITH DIFFERENTIAL/PLATELET
Abs Immature Granulocytes: 0.08 10*3/uL — ABNORMAL HIGH (ref 0.00–0.07)
Basophils Absolute: 0.1 10*3/uL (ref 0.0–0.1)
Basophils Relative: 1 %
Eosinophils Absolute: 0.1 10*3/uL (ref 0.0–0.5)
Eosinophils Relative: 1 %
HCT: 25.3 % — ABNORMAL LOW (ref 39.0–52.0)
Hemoglobin: 7.5 g/dL — ABNORMAL LOW (ref 13.0–17.0)
Immature Granulocytes: 1 %
Lymphocytes Relative: 2 %
Lymphs Abs: 0.2 10*3/uL — ABNORMAL LOW (ref 0.7–4.0)
MCH: 26 pg (ref 26.0–34.0)
MCHC: 29.6 g/dL — ABNORMAL LOW (ref 30.0–36.0)
MCV: 87.8 fL (ref 80.0–100.0)
Monocytes Absolute: 0.1 10*3/uL (ref 0.1–1.0)
Monocytes Relative: 2 %
Neutro Abs: 8.2 10*3/uL — ABNORMAL HIGH (ref 1.7–7.7)
Neutrophils Relative %: 93 %
Platelets: 201 10*3/uL (ref 150–400)
RBC: 2.88 MIL/uL — ABNORMAL LOW (ref 4.22–5.81)
RDW: 16.6 % — ABNORMAL HIGH (ref 11.5–15.5)
Smear Review: NORMAL
WBC: 8.7 10*3/uL (ref 4.0–10.5)
nRBC: 0 % (ref 0.0–0.2)

## 2023-03-31 LAB — RESP PANEL BY RT-PCR (RSV, FLU A&B, COVID)  RVPGX2
Influenza A by PCR: NEGATIVE
Influenza B by PCR: NEGATIVE
Resp Syncytial Virus by PCR: NEGATIVE
SARS Coronavirus 2 by RT PCR: NEGATIVE

## 2023-03-31 LAB — TROPONIN I (HIGH SENSITIVITY): Troponin I (High Sensitivity): 19 ng/L — ABNORMAL HIGH (ref <18)

## 2023-03-31 MED ORDER — LACTATED RINGERS IV BOLUS
1000.0000 mL | Freq: Once | INTRAVENOUS | Status: AC
  Administered 2023-03-31: 1000 mL via INTRAVENOUS

## 2023-03-31 MED ORDER — IOHEXOL 350 MG/ML SOLN
75.0000 mL | Freq: Once | INTRAVENOUS | Status: AC | PRN
  Administered 2023-03-31: 75 mL via INTRAVENOUS

## 2023-03-31 MED ORDER — IOHEXOL 300 MG/ML  SOLN: 80.0000 mL | Freq: Once | INTRAMUSCULAR | Status: DC | PRN

## 2023-03-31 MED ORDER — METOPROLOL TARTRATE 50 MG PO TABS: 50.0000 mg | ORAL_TABLET | Freq: Two times a day (BID) | ORAL | 1 refills | Status: DC

## 2023-03-31 NOTE — Progress Notes (Signed)
[facility] Clinical Social Work  Initial Assessment   Tony Benson is a 62 y.o. year old male contacted caregiver by phone. Clinical Social Work was referred by medical provider for assessment of psychosocial needs.   SDOH (Social Determinants of Health) assessments performed: Yes SDOH Interventions    Flowsheet Row Office Visit from 02/24/2023 in [facility] [place]  SDOH Interventions   Food Insecurity Interventions Intervention Not Indicated  Housing Interventions Intervention Not Indicated  Utilities Interventions Intervention Not Indicated       SDOH Screenings   Food Insecurity: No Food Insecurity (02/28/2023)  Housing: Low Risk  (02/28/2023)  Transportation Needs: No Transportation Needs (02/28/2023)  Utilities: Not At Risk (02/28/2023)  Depression (PHQ2-9): Low Risk  (02/24/2023)  Tobacco Use: Low Risk  (03/26/2023)     Distress Screen completed: No     No data to display            Family/Social Information:  Housing Arrangement: patient lives with his wife, [name]. Family members/support persons in your life? Family Transportation concerns: no  Employment: Legally disabled.  On short term disability at work.  Income source: Short-Term Disability Financial concerns: No Type of concern: None Food access concerns: no Religious or spiritual practice: Yes-Patient identifies as Control and instrumentation engineer. Services Currently in place:  [employer]  Coping/ Adjustment to diagnosis: Patient understands treatment plan and what happens next? yes Concerns about diagnosis and/or treatment: I'm not especially worried about anything Patient reported stressors:  None per patient's wife.  She works from home and is able to care for him. Hopes and/or priorities: Family Patient enjoys time with family/ friends Current coping skills/ strengths: Capable of independent living , Manufacturing systems engineer , Contractor , General fund of knowledge , and  Supportive family/friends     SUMMARY: Current SDOH Barriers:  None per patient's wife.  Clinical Social Work Clinical Goal(s):  Provide information and support during patient's treatment.  Interventions: Discussed common feeling and emotions when being diagnosed with cancer, and the importance of support during treatment Informed patient of the support team roles and support services at [facility] Provided CSW contact information and encouraged patient to call with any questions or concerns Provided patient with information about about the [employer] and mailed brochure, along with CSW contact information.   Follow Up Plan: Patient will contact CSW as needed. Patient verbalizes understanding of plan: Yes    Pascal Lux Duard Spiewak, LCSW Clinical Social Worker [facility]

## 2023-03-31 NOTE — ED Provider Notes (Signed)
[facility] Provider Note    Event Date/Time   First MD Initiated Contact with Patient 03/31/23 2051     (approximate)   History   Chief Complaint Cough   HPI  Tony Benson is a 62 y.o. male with past medical history of hypertension, diabetes, metastatic pancreatic cancer, asthma, and PE on Eliquis who presents to the ED complaining of cough.  Patient's wife states that he has been dealing with a cough productive of yellowish sputum for about the past week.  He also had fevers as high as 101 earlier in the week, but has not had any fevers today.  Wife became more concerned today when he started coughing up small amounts of blood.  He denies any chest pain or shortness of breath, deals with chronic swelling in both legs that is unchanged.  He takes Eliquis for prior PE and wife reports that he has not had any missed doses.  She does report that he was admitted last month for similar symptoms and diagnosed with PE as well as pneumonia.  Wife also reports that patient has had poor p.o. intake recently, has lost his voice due to what she describes as "laryngitis."     Physical Exam   Triage Vital Signs: ED Triage Vitals  Encounter Vitals Group     BP 03/31/23 1912 97/63     Systolic BP Percentile --      Diastolic BP Percentile --      Pulse Rate 03/31/23 1912 (!) 120     Resp 03/31/23 1912 16     Temp 03/31/23 1912 98.5 F (36.9 C)     Temp Source 03/31/23 1912 Oral     SpO2 03/31/23 1912 98 %     Weight 03/31/23 1910 149 lb 0.5 oz (67.6 kg)     Height 03/31/23 1910 5\' 10"  (1.778 m)     Head Circumference --      Peak Flow --      Pain Score 03/31/23 1910 6     Pain Loc --      Pain Education --      Exclude from Growth Chart --     Most recent vital signs: Vitals:   03/31/23 1912  BP: 97/63  Pulse: (!) 120  Resp: 16  Temp: 98.5 F (36.9 C)  SpO2: 98%    Constitutional: Alert and oriented. Eyes: Conjunctivae are normal. Head:  Atraumatic. Nose: No congestion/rhinnorhea. Mouth/Throat: Mucous membranes are dry. Cardiovascular: Tachycardic, regular rhythm. Grossly normal heart sounds.  2+ radial pulses bilaterally. Respiratory: Normal respiratory effort.  No retractions. Lungs CTAB. Gastrointestinal: Soft and nontender. No distention. Musculoskeletal: 2+ pitting edema to knees bilaterally without tenderness. Neurologic:  Normal speech and language. No gross focal neurologic deficits are appreciated.    ED Results / Procedures / Treatments   Labs (all labs ordered are listed, but only abnormal results are displayed) Labs Reviewed  CBC WITH DIFFERENTIAL/PLATELET - Abnormal; Notable for the following components:      Result Value   RBC 2.88 (*)    Hemoglobin 7.5 (*)    HCT 25.3 (*)    MCHC 29.6 (*)    RDW 16.6 (*)    Neutro Abs 8.2 (*)    Lymphs Abs 0.2 (*)    Abs Immature Granulocytes 0.08 (*)    All other components within normal limits  COMPREHENSIVE METABOLIC PANEL - Abnormal; Notable for the following components:   CO2 19 (*)    Glucose,  Bld 198 (*)    BUN 31 (*)    Albumin 2.1 (*)    Alkaline Phosphatase 670 (*)    Total Bilirubin 1.2 (*)    All other components within normal limits  TROPONIN I (HIGH SENSITIVITY) - Abnormal; Notable for the following components:   Troponin I (High Sensitivity) 19 (*)    All other components within normal limits  RESP PANEL BY RT-PCR (RSV, FLU A&B, COVID)  RVPGX2  TROPONIN I (HIGH SENSITIVITY)     EKG  ED ECG REPORT I, Chesley Noon, the attending physician, personally viewed and interpreted this ECG.   Date: 03/31/2023  EKG Time: 18:18  Rate: 123  Rhythm: sinus tachycardia  Axis: RAD  Intervals:none  ST&T Change: None  RADIOLOGY Chest x-ray reviewed and interpreted by me with right lower lobe infiltrate and effusion noted.  PROCEDURES:  Critical Care performed: No  Procedures   MEDICATIONS ORDERED IN ED: Medications  lactated ringers  bolus 1,000 mL (1,000 mLs Intravenous New Bag/Given 03/31/23 2209)  iohexol (OMNIPAQUE) 350 MG/ML injection 75 mL (75 mLs Intravenous Contrast Given 03/31/23 2300)     IMPRESSION / MDM / ASSESSMENT AND PLAN / ED COURSE  I reviewed the triage vital signs and the nursing notes.                              62 y.o. male with past medical history of hypertension, diabetes, metastatic pancreatic cancer, asthma, and PE on Eliquis who presents to the ED with productive cough for the past week and subjective fevers, coughed up some blood earlier today.  Patient's presentation is most consistent with acute presentation with potential threat to life or bodily function.  Differential diagnosis includes, but is not limited to, sepsis, pneumonia, pulmonary embolism, pleural effusion, ACS, anemia, electrolyte abnormality, AKI.  Patient ill-appearing but in no acute distress, vital signs remarkable for tachycardia but otherwise reassuring.  EKG shows sinus tachycardia with no ischemic changes, chest x-ray performed from triage shows persistent infiltrate in the right lower lobe with small associated effusion.  Given his hemoptysis, we will further assess with CTA of his chest to rule out PE and better assess potential pneumonia.  Lab results are pending at this time, we will hydrate with IV fluids given tachycardia and suspected dehydration.  Labs show worsening anemia with no significant leukocytosis, troponin mildly elevated and we will trend.  No significant AKI or electrolyte abnormality noted, but he does have elevated BUN to creatinine ratio consistent with dehydration.  LFTs are unremarkable, CTA results are pending at this time.  Patient turned over to oncoming provider pending CTA results and reassessment.     FINAL CLINICAL IMPRESSION(S) / ED DIAGNOSES   Final diagnoses:  Hemoptysis     Rx / DC Orders   ED Discharge Orders     None        Note:  This document was prepared using  Dragon voice recognition software and may include unintentional dictation errors.   Chesley Noon, MD 03/31/23 [phone]

## 2023-03-31 NOTE — ED Triage Notes (Addendum)
Pt reports cough and fever x3 days. Pt is currently getting chemo for pancreatic cancer. Pt reports he normally has elevated HR, takes metoprolol and missed his dose this evening. Denies chest pain or shortness of breath.

## 2023-04-01 ENCOUNTER — Inpatient Hospital Stay (HOSPITAL_COMMUNITY)
Admit: 2023-04-01 | Discharge: 2023-04-01 | Disposition: A | Discharge status: Home / Self Care | Payer: BC Managed Care – PPO | Attending: Family Medicine

## 2023-04-01 DIAGNOSIS — Z86711 Personal history of pulmonary embolism: Secondary | ICD-10-CM | POA: Insufficient documentation

## 2023-04-01 DIAGNOSIS — Z91010 Allergy to peanuts: Secondary | ICD-10-CM | POA: Diagnosis not present

## 2023-04-01 DIAGNOSIS — I3139 Other pericardial effusion (noninflammatory): Secondary | ICD-10-CM | POA: Diagnosis not present

## 2023-04-01 DIAGNOSIS — R042 Hemoptysis: Secondary | ICD-10-CM | POA: Diagnosis not present

## 2023-04-01 DIAGNOSIS — Z1152 Encounter for screening for COVID-19: Secondary | ICD-10-CM | POA: Diagnosis not present

## 2023-04-01 DIAGNOSIS — E119 Type 2 diabetes mellitus without complications: Secondary | ICD-10-CM | POA: Diagnosis present

## 2023-04-01 DIAGNOSIS — C259 Malignant neoplasm of pancreas, unspecified: Secondary | ICD-10-CM

## 2023-04-01 DIAGNOSIS — J9601 Acute respiratory failure with hypoxia: Secondary | ICD-10-CM | POA: Diagnosis not present

## 2023-04-01 DIAGNOSIS — Z515 Encounter for palliative care: Secondary | ICD-10-CM | POA: Diagnosis not present

## 2023-04-01 DIAGNOSIS — Z7901 Long term (current) use of anticoagulants: Secondary | ICD-10-CM | POA: Diagnosis not present

## 2023-04-01 DIAGNOSIS — I10 Essential (primary) hypertension: Secondary | ICD-10-CM | POA: Diagnosis present

## 2023-04-01 DIAGNOSIS — R1310 Dysphagia, unspecified: Secondary | ICD-10-CM | POA: Diagnosis present

## 2023-04-01 DIAGNOSIS — I3131 Malignant pericardial effusion in diseases classified elsewhere: Secondary | ICD-10-CM | POA: Diagnosis present

## 2023-04-01 DIAGNOSIS — E785 Hyperlipidemia, unspecified: Secondary | ICD-10-CM | POA: Diagnosis present

## 2023-04-01 DIAGNOSIS — Z7984 Long term (current) use of oral hypoglycemic drugs: Secondary | ICD-10-CM | POA: Diagnosis not present

## 2023-04-01 DIAGNOSIS — J4520 Mild intermittent asthma, uncomplicated: Secondary | ICD-10-CM | POA: Diagnosis not present

## 2023-04-01 DIAGNOSIS — Z91048 Other nonmedicinal substance allergy status: Secondary | ICD-10-CM | POA: Diagnosis not present

## 2023-04-01 DIAGNOSIS — C250 Malignant neoplasm of head of pancreas: Secondary | ICD-10-CM | POA: Diagnosis present

## 2023-04-01 DIAGNOSIS — Z9102 Food additives allergy status: Secondary | ICD-10-CM | POA: Diagnosis not present

## 2023-04-01 DIAGNOSIS — Z885 Allergy status to narcotic agent status: Secondary | ICD-10-CM | POA: Diagnosis not present

## 2023-04-01 DIAGNOSIS — R0600 Dyspnea, unspecified: Secondary | ICD-10-CM | POA: Diagnosis present

## 2023-04-01 DIAGNOSIS — J45909 Unspecified asthma, uncomplicated: Secondary | ICD-10-CM | POA: Diagnosis present

## 2023-04-01 DIAGNOSIS — J910 Malignant pleural effusion: Secondary | ICD-10-CM | POA: Diagnosis present

## 2023-04-01 DIAGNOSIS — I2699 Other pulmonary embolism without acute cor pulmonale: Secondary | ICD-10-CM | POA: Diagnosis not present

## 2023-04-01 DIAGNOSIS — C787 Secondary malignant neoplasm of liver and intrahepatic bile duct: Secondary | ICD-10-CM | POA: Diagnosis present

## 2023-04-01 DIAGNOSIS — Z8546 Personal history of malignant neoplasm of prostate: Secondary | ICD-10-CM | POA: Diagnosis not present

## 2023-04-01 DIAGNOSIS — Z794 Long term (current) use of insulin: Secondary | ICD-10-CM | POA: Diagnosis not present

## 2023-04-01 DIAGNOSIS — J189 Pneumonia, unspecified organism: Secondary | ICD-10-CM | POA: Diagnosis present

## 2023-04-01 DIAGNOSIS — G4733 Obstructive sleep apnea (adult) (pediatric): Secondary | ICD-10-CM | POA: Diagnosis present

## 2023-04-01 DIAGNOSIS — G8929 Other chronic pain: Secondary | ICD-10-CM | POA: Diagnosis present

## 2023-04-01 DIAGNOSIS — D649 Anemia, unspecified: Secondary | ICD-10-CM | POA: Diagnosis present

## 2023-04-01 DIAGNOSIS — C7802 Secondary malignant neoplasm of left lung: Secondary | ICD-10-CM | POA: Diagnosis present

## 2023-04-01 DIAGNOSIS — L89152 Pressure ulcer of sacral region, stage 2: Secondary | ICD-10-CM | POA: Diagnosis present

## 2023-04-01 LAB — ECHOCARDIOGRAM COMPLETE
AR max vel: 2.7 cm2
AV Area VTI: 2.91 cm2
AV Area mean vel: 2.44 cm2
AV Mean grad: 4 mm[Hg]
AV Peak grad: 6 mm[Hg]
Ao pk vel: 1.22 m/s
Area-P 1/2: 4.57 cm2
Height: 70 in
MV VTI: 2.32 cm2
S' Lateral: 1.8 cm
Weight: 2384.5 [oz_av]

## 2023-04-01 LAB — URINALYSIS, COMPLETE (UACMP) WITH MICROSCOPIC
Bacteria, UA: NONE SEEN
Bilirubin Urine: NEGATIVE
Glucose, UA: >=500 mg/dL — AB
Ketones, ur: 5 mg/dL — AB
Leukocytes,Ua: NEGATIVE
Nitrite: NEGATIVE
Protein, ur: NEGATIVE mg/dL
Specific Gravity, Urine: 1.03 (ref 1.005–1.030)
Squamous Epithelial / HPF: 0 /[HPF] (ref 0–5)
pH: 5 (ref 5.0–8.0)

## 2023-04-01 LAB — BASIC METABOLIC PANEL
Anion gap: 8 (ref 5–15)
BUN: 29 mg/dL — ABNORMAL HIGH (ref 8–23)
CO2: 23 mmol/L (ref 22–32)
Calcium: 8.8 mg/dL — ABNORMAL LOW (ref 8.9–10.3)
Chloride: 111 mmol/L (ref 98–111)
Creatinine, Ser: 0.74 mg/dL (ref 0.61–1.24)
GFR, Estimated: >60 mL/min (ref >60)
Glucose, Bld: 167 mg/dL — ABNORMAL HIGH (ref 70–99)
Potassium: 4.4 mmol/L (ref 3.5–5.1)
Sodium: 142 mmol/L (ref 135–145)

## 2023-04-01 LAB — CBC
HCT: 21.8 % — ABNORMAL LOW (ref 39.0–52.0)
Hemoglobin: 6.5 g/dL — ABNORMAL LOW (ref 13.0–17.0)
MCH: 26.4 pg (ref 26.0–34.0)
MCHC: 29.8 g/dL — ABNORMAL LOW (ref 30.0–36.0)
MCV: 88.6 fL (ref 80.0–100.0)
Platelets: 161 10*3/uL (ref 150–400)
RBC: 2.46 MIL/uL — ABNORMAL LOW (ref 4.22–5.81)
RDW: 16.8 % — ABNORMAL HIGH (ref 11.5–15.5)
WBC: 9.9 10*3/uL (ref 4.0–10.5)
nRBC: 0 % (ref 0.0–0.2)

## 2023-04-01 LAB — TRANSFUSION REACTION
DAT C3: NEGATIVE
Post RXN DAT IgG: NEGATIVE

## 2023-04-01 LAB — PREPARE RBC (CROSSMATCH)

## 2023-04-01 LAB — TROPONIN I (HIGH SENSITIVITY): Troponin I (High Sensitivity): 38 ng/L — ABNORMAL HIGH (ref <18)

## 2023-04-01 MED ORDER — LOSARTAN POTASSIUM 50 MG PO TABS: 100.0000 mg | ORAL_TABLET | Freq: Every day | ORAL | Status: DC

## 2023-04-01 MED ORDER — EMPAGLIFLOZIN 25 MG PO TABS
25.0000 mg | ORAL_TABLET | Freq: Every day | ORAL | Status: DC
  Administered 2023-04-01 – 2023-04-05 (×5): 25 mg via ORAL
  Filled 2023-04-01 (×5): qty 1

## 2023-04-01 MED ORDER — GUAIFENESIN 100 MG/5ML PO LIQD
15.0000 mL | Freq: Four times a day (QID) | ORAL | Status: DC
  Administered 2023-04-02 – 2023-04-05 (×14): 15 mL via ORAL
  Filled 2023-04-01 (×16): qty 20

## 2023-04-01 MED ORDER — MAGNESIUM HYDROXIDE 400 MG/5ML PO SUSP: 30.0000 mL | Freq: Every day | ORAL | Status: DC | PRN

## 2023-04-01 MED ORDER — HYDROCOD POLI-CHLORPHE POLI ER 10-8 MG/5ML PO SUER: 5.0000 mL | Freq: Two times a day (BID) | ORAL | Status: DC | PRN

## 2023-04-01 MED ORDER — ONDANSETRON HCL 4 MG PO TABS: 4.0000 mg | ORAL_TABLET | Freq: Four times a day (QID) | ORAL | Status: DC | PRN

## 2023-04-01 MED ORDER — GUAIFENESIN ER 600 MG PO TB12
600.0000 mg | ORAL_TABLET | Freq: Two times a day (BID) | ORAL | Status: DC
  Administered 2023-04-01: 600 mg via ORAL
  Filled 2023-04-01 (×2): qty 1

## 2023-04-01 MED ORDER — APIXABAN 5 MG PO TABS
5.0000 mg | ORAL_TABLET | Freq: Two times a day (BID) | ORAL | Status: DC
  Administered 2023-04-01: 5 mg via ORAL
  Filled 2023-04-01: qty 1

## 2023-04-01 MED ORDER — ONDANSETRON HCL 4 MG/2ML IJ SOLN
4.0000 mg | Freq: Four times a day (QID) | INTRAMUSCULAR | Status: DC | PRN
Start: 2023-04-01 — End: 2023-04-05

## 2023-04-01 MED ORDER — ALPRAZOLAM 0.5 MG PO TABS: 0.5000 mg | ORAL_TABLET | Freq: Two times a day (BID) | ORAL | Status: DC | PRN

## 2023-04-01 MED ORDER — ACETAMINOPHEN 650 MG RE SUPP
650.0000 mg | Freq: Four times a day (QID) | RECTAL | Status: DC | PRN
Start: 2023-04-01 — End: 2023-04-05

## 2023-04-01 MED ORDER — METOPROLOL TARTRATE 50 MG PO TABS
50.0000 mg | ORAL_TABLET | Freq: Two times a day (BID) | ORAL | Status: DC
Start: 2023-04-01 — End: 2023-04-05
  Administered 2023-04-01 – 2023-04-05 (×9): 50 mg via ORAL
  Filled 2023-04-01: qty 1
  Filled 2023-04-01: qty 2
  Filled 2023-04-01: qty 1
  Filled 2023-04-01 (×2): qty 2
  Filled 2023-04-01 (×4): qty 1

## 2023-04-01 MED ORDER — SODIUM CHLORIDE 0.9% IV SOLUTION
Freq: Once | INTRAVENOUS | Status: AC
  Filled 2023-04-01: qty 250

## 2023-04-01 MED ORDER — SODIUM CHLORIDE 0.9 % IV SOLN
500.0000 mg | INTRAVENOUS | Status: AC
  Administered 2023-04-01 – 2023-04-05 (×5): 500 mg via INTRAVENOUS
  Filled 2023-04-01 (×5): qty 5

## 2023-04-01 MED ORDER — NALOXONE HCL 4 MG/0.1ML NA LIQD: 1.0000 | NASAL | Status: DC | PRN

## 2023-04-01 MED ORDER — IPRATROPIUM-ALBUTEROL 0.5-2.5 (3) MG/3ML IN SOLN
3.0000 mL | Freq: Four times a day (QID) | RESPIRATORY_TRACT | Status: DC
  Administered 2023-04-01 – 2023-04-03 (×9): 3 mL via RESPIRATORY_TRACT
  Filled 2023-04-01 (×9): qty 3

## 2023-04-01 MED ORDER — OLANZAPINE 5 MG PO TABS
5.0000 mg | ORAL_TABLET | Freq: Every day | ORAL | Status: DC
  Administered 2023-04-01 – 2023-04-04 (×5): 5 mg via ORAL
  Filled 2023-04-01 (×6): qty 1

## 2023-04-01 MED ORDER — SODIUM CHLORIDE 0.9 % IV SOLN: INTRAVENOUS | Status: DC

## 2023-04-01 MED ORDER — ATORVASTATIN CALCIUM 20 MG PO TABS
10.0000 mg | ORAL_TABLET | Freq: Every day | ORAL | Status: DC
  Administered 2023-04-01 – 2023-04-05 (×5): 10 mg via ORAL
  Filled 2023-04-01 (×5): qty 1

## 2023-04-01 MED ORDER — IPRATROPIUM-ALBUTEROL 0.5-2.5 (3) MG/3ML IN SOLN
3.0000 mL | Freq: Four times a day (QID) | RESPIRATORY_TRACT | Status: DC
Start: 2023-04-01 — End: 2023-04-01

## 2023-04-01 MED ORDER — ENSURE ENLIVE PO LIQD
237.0000 mL | Freq: Two times a day (BID) | ORAL | Status: DC
  Administered 2023-04-01 – 2023-04-04 (×7): 237 mL via ORAL

## 2023-04-01 MED ORDER — HYDROMORPHONE HCL 1 MG/ML IJ SOLN
1.0000 mg | Freq: Once | INTRAMUSCULAR | Status: AC
  Administered 2023-04-01: 1 mg via INTRAVENOUS
  Filled 2023-04-01: qty 1

## 2023-04-01 MED ORDER — TRAZODONE HCL 50 MG PO TABS
25.0000 mg | ORAL_TABLET | Freq: Every evening | ORAL | Status: DC | PRN
  Administered 2023-04-03 – 2023-04-04 (×2): 25 mg via ORAL
  Filled 2023-04-01 (×2): qty 1

## 2023-04-01 MED ORDER — AMLODIPINE BESYLATE 10 MG PO TABS
10.0000 mg | ORAL_TABLET | Freq: Every day | ORAL | Status: DC
  Filled 2023-04-01 (×2): qty 1
  Filled 2023-04-01 (×2): qty 2
  Filled 2023-04-01: qty 1

## 2023-04-01 MED ORDER — FENTANYL 12 MCG/HR TD PT72
1.0000 | MEDICATED_PATCH | TRANSDERMAL | Status: DC
  Administered 2023-04-02 – 2023-04-05 (×2): 1 via TRANSDERMAL
  Filled 2023-04-01 (×3): qty 1

## 2023-04-01 MED ORDER — ACETAMINOPHEN 325 MG PO TABS: 650.0000 mg | ORAL_TABLET | Freq: Four times a day (QID) | ORAL | Status: DC | PRN

## 2023-04-01 MED ORDER — METOPROLOL TARTRATE 25 MG PO TABS
25.0000 mg | ORAL_TABLET | ORAL | Status: AC
  Administered 2023-04-01: 25 mg via ORAL
  Filled 2023-04-01: qty 1

## 2023-04-01 MED ORDER — OMEGA-3-ACID ETHYL ESTERS 1 G PO CAPS
1.0000 g | ORAL_CAPSULE | Freq: Every day | ORAL | Status: DC
  Administered 2023-04-01 – 2023-04-04 (×4): 1 g via ORAL
  Filled 2023-04-01 (×5): qty 1

## 2023-04-01 MED ORDER — HYDROMORPHONE HCL 2 MG PO TABS
4.0000 mg | ORAL_TABLET | ORAL | Status: DC | PRN
  Administered 2023-04-01 (×2): 4 mg via ORAL
  Administered 2023-04-02: 2 mg via ORAL
  Administered 2023-04-02 – 2023-04-03 (×2): 4 mg via ORAL
  Filled 2023-04-01 (×5): qty 2

## 2023-04-01 MED ORDER — SODIUM CHLORIDE 0.9 % IV SOLN
2.0000 g | INTRAVENOUS | Status: AC
  Administered 2023-04-01 – 2023-04-05 (×5): 2 g via INTRAVENOUS
  Filled 2023-04-01 (×5): qty 20

## 2023-04-01 NOTE — Assessment & Plan Note (Signed)
-  The patient will be placed on supplemental coverage with NovoLog. - We will continue Jardiance.

## 2023-04-01 NOTE — Assessment & Plan Note (Signed)
-   The patient is undergoing palliative chemotherapy. - Pain management will be provided.

## 2023-04-01 NOTE — ED Notes (Signed)
Wife at bedside.

## 2023-04-01 NOTE — Assessment & Plan Note (Signed)
-   The patient will be placed on as needed DuoNebs. - We will hold off long-acting beta agonist for now.

## 2023-04-01 NOTE — H&P (Addendum)
[place]   PATIENT NAME: Tony Benson    MR#:  [id number]  DATE OF BIRTH:  [birth year]  DATE OF ADMISSION:  03/31/2023  PRIMARY CARE PHYSICIAN: Sherrie Mustache, MD   Patient is coming from: Home  REQUESTING/REFERRING PHYSICIAN: Chiquita Loth, MD  CHIEF COMPLAINT:   Chief Complaint  Patient presents with   Cough    HISTORY OF PRESENT ILLNESS:  Tony Benson is a 62 y.o. male with medical history significant for metastatic pancreatic cancer on palliative chemotherapy, asthma, prostate cancer, type 2 diabetes mellitus, hypertension, dyslipidemia, and PE on Eliquis, who presented to the emergency room with acute onset of dyspnea with associated cough productive of yellow sputum without wheezing.  The patient denied any chest pain or palpitations.  No nausea or vomiting or diarrhea.  No bleeding diathesis.  No dysuria, oliguria or hematuria or flank pain.  No presyncope or syncope.  No bleeding diathesis.  She reported having fever with Tmax of 101 at home but has been afebrile here.  She took 2 Tylenol today though.  ED Course: When the patient came to the ER, heart rate was 120 and BP 97/63 and later 114/57 with otherwise normal vital signs.  Labs revealed BUN of 31 and CO2 of 19 with a blood glucose of 198, alk phos 678 compared to 591 on 03/26/2023 and albumin 2.1 compared to 2.2 then and total bili 1.2 compared to 0.7 then.  High sensitive troponin I was 19 and later 38 and CBC showed anemia with hemoglobin 7.5 hematocrit 25.3 compared to 8.4 and 27.6 on 12/11 with neutrophilia.Marland Kitchen  Respiratory panel came back negative. EKG as reviewed by me :EKG showed sinus tachycardia with a rate of 123 with right axis deviation and poor R wave progression with low voltage QRS.  Imaging: Two-view chest x-ray showed small right pleural effusion there is persistent, patchy opacities in the right lung base that have decreased but have not completely resolved, new 8 mm nodule density in the left midlung  with recommendation for follow-up chest CT.  Chest CTA revealed: 1. Negative for acute pulmonary embolism. 2. Large pericardial effusion increased compared to 02/27/2023. Clinical correlation recommended to exclude tamponade physiology. 3. Increased bilateral centrilobular micro nodules and tree-in-bud opacities with more confluence opacities in the right lung, likely due to bronchopneumonia from aspiration. Lymphangitic spread of tumor could appear similarly. 4. Irregular pleural thickening or loculated pleural effusion in the lower right chest has increased compared to 02/27/2023. There is associated pleural nodularity in the posterior lower lobe. This may be due to bronchopneumonia though metastases are not excluded. 5. 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 6.  Aortic atherosclerosis.  The patient was given 1 L bolus of IV lactated ringer and 1 mg of IV Dilaudid.  He will be admitted to a medical telemetry observation bed for further evaluation and management.   PAST MEDICAL HISTORY:   Past Medical History:  Diagnosis Date   Asthma    Cancer (HCC)    prostate   Diabetes mellitus without complication (HCC)    type II   Hyperlipidemia    Hypertension    Sleep apnea    uses Cpap machine     PAST SURGICAL HISTORY:   Past Surgical History:  Procedure Laterality Date   CHOLECYSTECTOMY     COLONOSCOPY WITH PROPOFOL N/A 11/18/2018   Procedure: COLONOSCOPY WITH PROPOFOL;  Surgeon: Pasty Spillers, MD;  Location: [facility] ENDOSCOPY;  Service: Gastroenterology;  Laterality: N/A;  COLONOSCOPY WITH PROPOFOL N/A 02/05/2023   Procedure: COLONOSCOPY WITH PROPOFOL;  Surgeon: Toney Reil, MD;  Location: [facility] ENDOSCOPY;  Service: Gastroenterology;  Laterality: N/A;   IR IMAGING GUIDED PORT INSERTION  03/24/2023   IR US LIVER BIOPSY  03/03/2023   LAPAROSCOPIC RETROPUBIC PROSTATECTOMY     2023   LUNG SURGERY     "ligation of thoracic duct"   PROSTATE BIOPSY N/A  09/21/2020   Procedure: PROSTATE BIOPSY [name];  Surgeon: [name], MD;  Location: [facility] ORS;  Service: Urology;  Laterality: N/A;    SOCIAL HISTORY:   Social History   Tobacco Use   Smoking status: Never   Smokeless tobacco: Never  Substance Use Topics   Alcohol use: No    FAMILY HISTORY:   Family History  Problem Relation Age of Onset   Allergies Sister     DRUG ALLERGIES:   Allergies  Allergen Reactions   Other Anaphylaxis    peanuts   Morphine And Codeine Itching   Tree Extract Other (See Comments)    ALMONDS  ---- EYE/ FACIAL SWELLING   Wound Dressing Adhesive Rash    REVIEW OF SYSTEMS:   ROS As per history of present illness. All pertinent systems were reviewed above. Constitutional, HEENT, cardiovascular, respiratory, GI, GU, musculoskeletal, neuro, psychiatric, endocrine, integumentary and hematologic systems were reviewed and are otherwise negative/unremarkable except for positive findings mentioned above in the HPI.   MEDICATIONS AT HOME:   Prior to Admission medications   Medication Sig Start Date End Date Taking? Authorizing Provider  acetaminophen (TYLENOL) 500 MG tablet Take 500 mg by mouth every 6 (six) hours as needed.    [provider]  ALPRAZolam Prudy Feeler) 0.5 MG tablet Take 1 tablet (0.5 mg total) by mouth 2 (two) times daily as needed for anxiety. 03/07/23   Alford Highland, MD  amLODipine (NORVASC) 10 MG tablet Take 10 mg by mouth daily. 09/10/17   [provider]  apixaban (ELIQUIS) 5 MG TABS tablet Take 2 tablets (10 mg total) by mouth 2 (two) times daily for 5 days, THEN 1 tablet (5 mg total) 2 (two) times daily for 25 days. 03/07/23 04/06/23  Alford Highland, MD  atorvastatin (LIPITOR) 10 MG tablet Take 10 mg by mouth daily.    [provider]  feeding supplement (ENSURE ENLIVE / ENSURE PLUS) LIQD Take 237 mLs by mouth 2 (two) times daily between meals. 03/07/23   Alford Highland, MD  fentaNYL (DURAGESIC) 12  MCG/HR Place 1 patch onto the skin every 3 (three) days. 03/20/23   Alinda Dooms, NP  HYDROmorphone (DILAUDID) 2 MG tablet Take 2 tablets (4 mg total) by mouth every 4 (four) hours as needed for severe pain (pain score 7-10). 03/26/23   Borders, Daryl Eastern, NP  HYDROmorphone (DILAUDID) 4 MG tablet Take 1 tablet (4 mg total) by mouth every 4 (four) hours as needed for severe pain (pain score 7-10). 03/20/23   Alinda Dooms, NP  insulin aspart (NOVOLOG) 100 UNIT/ML FlexPen Inject 5 Units into the skin 3 (three) times daily with meals. If eating and Blood Glucose (BG) 80 or higher inject 5 units for meal coverage and add correction dose per scale. If not eating, correction dose only. BG <150= 0 unit; BG 150-200= 1 unit; BG 201-250= 2 unit; BG 251-300= 3 unit; BG 301-350= 4 unit; BG 351-400= 5 unit; BG >400= 6 unit and Call Primary care. 03/07/23   Alford Highland, MD  JARDIANCE 25 MG TABS tablet Take  25 mg by mouth daily. 12/23/22   [provider]  lidocaine-prilocaine (EMLA) cream Apply on the port. 30 -45 min  prior to port access. 03/12/23   Earna Coder, MD  losartan (COZAAR) 100 MG tablet Take 100 mg by mouth daily. 08/15/17   [provider]  metoprolol tartrate (LOPRESSOR) 50 MG tablet Take 1 tablet (50 mg total) by mouth 2 (two) times daily. 03/31/23   Earna Coder, MD  mometasone-formoterol (DULERA) 100-5 MCG/ACT AERO Inhale 2 puffs into the lungs 2 (two) times daily as needed for wheezing or shortness of breath.    [provider]  naloxone [facility]) nasal spray 4 mg/0.1 mL One spray in nostril for unresponsiveness 03/07/23   Alford Highland, MD  OLANZapine (ZYPREXA) 5 MG tablet Take 1 tablet (5 mg total) by mouth at bedtime. 03/26/23   Earna Coder, MD  Omega-3 Fatty Acids (FISH OIL) 1000 MG CAPS Take 1,000 mg by mouth daily.    [provider]  ondansetron (ZOFRAN) 8 MG tablet One pill every 8 hours as needed for nausea/vomitting.  03/12/23   Earna Coder, MD  prochlorperazine (COMPAZINE) 10 MG tablet Take 1 tablet (10 mg total) by mouth every 6 (six) hours as needed for nausea or vomiting. 03/12/23   Earna Coder, MD      VITAL SIGNS:  Blood pressure (!) 100/49, pulse 91, temperature 98.9 F (37.2 C), temperature source Oral, resp. rate 16, height 5\' 10"  (1.778 m), weight 67.6 kg, SpO2 100%.  PHYSICAL EXAMINATION:  Physical Exam  GENERAL:  62 y.o.-year-old patient lying in the bed with no acute distress.  EYES: Pupils equal, round, reactive to light and accommodation. No scleral icterus. Extraocular muscles intact.  HEENT: Head atraumatic, normocephalic. Oropharynx and nasopharynx clear.  NECK:  Supple, no jugular venous distention. No thyroid enlargement, no tenderness.  LUNGS: Normal breath sounds bilaterally, no wheezing, rales,rhonchi or crepitation. No use of accessory muscles of respiration.  CARDIOVASCULAR: Regular rate and rhythm, S1, S2 normal. No murmurs, rubs, or gallops.  ABDOMEN: Soft, nondistended, nontender. Bowel sounds present. No organomegaly or mass.  EXTREMITIES: No pedal edema, cyanosis, or clubbing.  NEUROLOGIC: Cranial nerves II through XII are intact. Muscle strength 5/5 in all extremities. Sensation intact. Gait not checked.  PSYCHIATRIC: The patient is alert and oriented x 3.  Normal affect and good eye contact. SKIN: No obvious rash, lesion, or ulcer.   LABORATORY PANEL:   CBC Recent Labs  Lab 03/31/23 2200  WBC 8.7  HGB 7.5*  HCT 25.3*  PLT 201   ------------------------------------------------------------------------------------------------------------------  Chemistries  Recent Labs  Lab 03/31/23 2200  NA 139  K 4.5  CL 109  CO2 19*  GLUCOSE 198*  BUN 31*  CREATININE 0.76  CALCIUM 9.0  AST 23  ALT 28  ALKPHOS 670*  BILITOT 1.2*    ------------------------------------------------------------------------------------------------------------------  Cardiac Enzymes No results for input(s): "TROPONINI" in the last 168 hours. ------------------------------------------------------------------------------------------------------------------  RADIOLOGY:  CT Angio Chest PE W/Cm &/Or Wo Cm Result Date: 04/01/2023 CLINICAL DATA:  Cough and fever for 3 days. On chemo for pancreatic cancer. Elevated heart rate. PE suspected. EXAM: CT ANGIOGRAPHY CHEST WITH CONTRAST TECHNIQUE: Multidetector CT imaging of the chest was performed using the standard protocol during bolus administration of intravenous contrast. Multiplanar CT image reconstructions and MIPs were obtained to evaluate the vascular anatomy. RADIATION DOSE REDUCTION: This exam was performed according to the departmental dose-optimization program which includes automated exposure control, adjustment of the mA and/or  kV according to patient size and/or use of iterative reconstruction technique. CONTRAST:  75mL OMNIPAQUE IOHEXOL 350 MG/ML SOLN COMPARISON:  Same day chest radiograph and CTA chest 04/28/2022 FINDINGS: Cardiovascular: Right IJ CVC tip at the superior cavoatrial junction. Negative for acute pulmonary embolism. Large pericardial effusion increased compared to 02/27/2023. Clinical correlation recommended to exclude tamponade physiology. Mild aortic atherosclerotic calcification. Mediastinum/Nodes: Debris layering within the posterior trachea. Esophagus is unremarkable. Surgical clips adjacent to the lower esophagus. Mediastinal and hilar adenopathy is not well evaluated. Lungs/Pleura: Diffuse bronchial wall thickening scattered mucous plugging greatest in the right lower lobe. Bilateral centrilobular micro nodules and tree-in-bud opacities with more confluence opacities in the right lung. Irregular pleural thickening or loculated pleural effusion in the lower right chest has  increased compared to 02/27/2023. There is associated pleural nodularity in the posterior lower lobe. For example 14 x 9 mm nodule on series 5/image 97. Pleural calcifications in the posterior right lung base. No pneumothorax. Upper Abdomen: Incompletely evaluated large metastasis in the right hepatic lobe appears slightly increased compared to 02/27/2023, measuring up to 14.5 cm previously 13.3 cm. Partially visualized dilated main pancreatic duct. Pancreatic head is not included in the exam. Small amount of perisplenic ascites. Musculoskeletal: No acute fracture or destructive osseous lesion. Review of the MIP images confirms the above findings. IMPRESSION: 1. Negative for acute pulmonary embolism. 2. Large pericardial effusion increased compared to 02/27/2023. Clinical correlation recommended to exclude tamponade physiology. 3. Increased bilateral centrilobular micro nodules and tree-in-bud opacities with more confluence opacities in the right lung, likely due to bronchopneumonia from aspiration. Lymphangitic spread of tumor could appear similarly. 4. Irregular pleural thickening or loculated pleural effusion in the lower right chest has increased compared to 02/27/2023. There is associated pleural nodularity in the posterior lower lobe. This may be due to bronchopneumonia though metastases are not excluded. 5. 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 Aortic Atherosclerosis (ICD10-I70.0). Electronically Signed   By: Minerva Fester M.D.   On: 04/01/2023 00:53   DG Chest 2 View Result Date: 03/31/2023 CLINICAL DATA:  Cough and fever EXAM: CHEST - 2 VIEW COMPARISON:  Chest x-ray 03/05/2023.  Chest CT 02/27/2023. FINDINGS: There is a new right chest port with distal catheter tip projecting over the distal SVC. The cardiac silhouette appears mildly enlarged, unchanged. Small right pleural effusion persists. Patchy opacities in the right lung base have decreased, but have not completely resolved. There is a  new focal nodular density in the left mid lung measuring 8 mm. The left lung is otherwise clear. There is no pneumothorax or acute fracture. IMPRESSION: 1. Small right pleural effusion persists. 2. Patchy opacities in the right lung base have decreased, but have not completely resolved. 3. New 8 mm nodular density in the left mid lung. Recommend follow-up chest CT. Electronically Signed   By: Darliss Cheney M.D.   On: 03/31/2023 21:10      IMPRESSION AND PLAN:  Assessment and Plan: * Dyspnea - This is multifactorial due to his large pericardial effusion, symptomatic anemia, pleural effusion, possible bronchopneumonia and lung metastasis. - The patient be admitted to a medical telemetry observation bed. - Will continue monitoring H&H. - We will place him on IV Rocephin and Zithromax. - Mucolytic therapy will be provided. - Cardiology consulted 2D echo be obtained given his pericardial effusion. - I notified [facility] group about the patient.  Essential hypertension - We will continue antihypertensive therapy.  Type 2 diabetes mellitus without complications (HCC) - The patient will  be placed on supplemental coverage with NovoLog. - We will continue Jardiance.  History of pulmonary embolism - We will continue Eliquis.  Pancreatic cancer metastasized to liver [facility]) - The patient is undergoing palliative chemotherapy. - Pain management will be provided.  Dyslipidemia - We will continue statin therapy.  Elevated alk phos is due to his metastatic pancreatic cancer rather than statin therapy.  Asthma, chronic - The patient will be placed on as needed DuoNebs. - We will hold off long-acting beta agonist for now.    DVT prophylaxis: Eliquis Advanced Care Planning:  Code Status: full code.  This was clearly discussed with him and his family. Family Communication:  The plan of care was discussed in details with the patient (and family). I answered all questions. The patient agreed to proceed  with the above mentioned plan. Further management will depend upon hospital course. Disposition Plan: Back to previous home environment Consults called: Cardiology All the records are reviewed and case discussed with ED provider.  Status is: Observation  I certify that at the time of admission, it is my clinical judgment that the patient will require hospital care extending last than 2 midnights.                            Dispo: The patient is from: Home              Anticipated d/c is to: Home              Patient currently is not medically stable to d/c.              Difficult to place patient: No  Hannah Beat M.D on 04/01/2023 at 5:27 AM  [facility]   From 7 PM-7 AM, contact night-coverage [email or web address]  CC: Primary care physician; Sherrie Mustache, MD

## 2023-04-01 NOTE — ED Notes (Addendum)
Blood administration paused at 1258 and MD Djan made aware of pts complaint of SOB. Pt received 50.1 mL of blood

## 2023-04-01 NOTE — ED Provider Notes (Signed)
-----------------------------------------   1:02 AM on 04/01/2023 -----------------------------------------   CTA chest shows no PE but moderate to large right pleural effusion.  Updated patient and spouse of imaging results.  Will consult hospitalist services for evaluation and admission.  Dilaudid given for patient's chronic right shoulder pain for which she takes scheduled Dilaudid at home.   Irean Hong, MD 04/01/23 [phone]

## 2023-04-01 NOTE — Assessment & Plan Note (Signed)
-   We will continue Eliquis. 

## 2023-04-01 NOTE — Progress Notes (Signed)
*  PRELIMINARY RESULTS* Echocardiogram 2D Echocardiogram has been performed.  Tony Benson 04/01/2023, 2:04 PM

## 2023-04-01 NOTE — Consult Note (Signed)
Cardiology Consultation   Patient ID: Tony Benson MRN: [id number]; DOB: 29-May-1960  Admit date: 03/31/2023 Date of Consult: 04/01/2023  PCP:  Sherrie Mustache, MD   Tony Benson Cardiologist:  None        Patient Profile:   Tony Benson is a 62 y.o. male with a hx of metastatic pancreatic cancer on palliative chemotherapy, asthma, prostate cancer, T2DM, HTN, dyslipidemia, OSA, and hx DVT and PE on Eliquis who is being seen 04/01/2023 for the evaluation of pericardial effusion at the request of Dr. Arville Care.  History of Present Illness:   Tony Benson was previously hospitalized 02/28/2023-03/07/2023 at [facility] for bilateral LE DVTs and bilateral PE. CTA chest showed small bilateral pulmonary emboli, moderate to large pericardial effusion, and right hepatic mass suspicious for malignancy. Subsequent MRI of the abdomen showed pancreatic mass concerning for pancreatic neoplasm with numerous liver lesions suspicious for metastases. Liver mass biopsy showed metastatic carcinoma. Echocardiogram on 03/01/23 showed large circumferential pericardial effusion without evidence of tamponade physiology. Echocardiogram on 03/04/23 showed moderate circumferential pericardial effusion without evidence of cardiac tamponade. Cardiology did see and recommended conservative management of pericardial effusion. Not a good candidate for invasive procedures.   Patient presented to the ED on 12/16 with acute onset dyspnea with associated productive cough. He also notes chronic right shoulder pain. HR 120 and BP 97/63. Labs pertinent for BUN 31, CO2 19, glucose 198, alk phos 678, albumin 2.1, and total bili 1.2. Troponin 19>>38. CBC shows anemia hgb 7.5. EKG sinus tachycardia with right axis deviation and low voltage QRS. CXR showed small right pleural effusion with persistent patchy opacities in the right lung base, new 8 mm nodule density in the left mid lung. He denies chest pain, palpitations,  nausea, vomiting, lightheadedness, and dizziness.    Past Medical History:  Diagnosis Date   Asthma    Cancer (HCC)    prostate   Diabetes mellitus without complication (HCC)    type II   Hyperlipidemia    Hypertension    Sleep apnea    uses Cpap machine     Past Surgical History:  Procedure Laterality Date   CHOLECYSTECTOMY     COLONOSCOPY WITH PROPOFOL N/A 11/18/2018   Procedure: COLONOSCOPY WITH PROPOFOL;  Surgeon: Tony Spillers, MD;  Location: [facility] ENDOSCOPY;  Service: Gastroenterology;  Laterality: N/A;   COLONOSCOPY WITH PROPOFOL N/A 02/05/2023   Procedure: COLONOSCOPY WITH PROPOFOL;  Surgeon: Tony Reil, MD;  Location: [facility] ENDOSCOPY;  Service: Gastroenterology;  Laterality: N/A;   IR IMAGING GUIDED PORT INSERTION  03/24/2023   IR US LIVER BIOPSY  03/03/2023   LAPAROSCOPIC RETROPUBIC PROSTATECTOMY     2023   LUNG SURGERY     "ligation of thoracic duct"   PROSTATE BIOPSY N/A 09/21/2020   Procedure: PROSTATE BIOPSY Tony Benson;  Surgeon: Tony Ape, MD;  Location: [facility] ORS;  Service: Urology;  Laterality: N/A;     Home Medications:  Prior to Admission medications   Medication Sig Start Date End Date Taking? Authorizing Provider  acetaminophen (TYLENOL) 500 MG tablet Take 500 mg by mouth every 6 (six) hours as needed.   Yes [provider]  ALPRAZolam (XANAX) 0.5 MG tablet Take 1 tablet (0.5 mg total) by mouth 2 (two) times daily as needed for anxiety. 03/07/23  Yes Wieting, Richard, MD  amLODipine (NORVASC) 10 MG tablet Take 10 mg by mouth daily. 09/10/17  Yes [provider]  apixaban (ELIQUIS) 5 MG TABS tablet Take 2 tablets (  10 mg total) by mouth 2 (two) times daily for 5 days, THEN 1 tablet (5 mg total) 2 (two) times daily for 25 days. 03/07/23 04/06/23 Yes Wieting, Richard, MD  atorvastatin (LIPITOR) 10 MG tablet Take 10 mg by mouth daily.   Yes [provider]  fentaNYL (DURAGESIC) 12 MCG/HR Place 1 patch onto the skin  every 3 (three) days. 03/20/23  Yes Alinda Dooms, NP  HYDROmorphone (DILAUDID) 2 MG tablet Take 2 tablets (4 mg total) by mouth every 4 (four) hours as needed for severe pain (pain score 7-10). 03/26/23  Yes Tony, Daryl Eastern, NP  HYDROmorphone (DILAUDID) 4 MG tablet Take 1 tablet (4 mg total) by mouth every 4 (four) hours as needed for severe pain (pain score 7-10). 03/20/23  Yes Alinda Dooms, NP  insulin aspart (NOVOLOG) 100 UNIT/ML FlexPen Inject 5 Units into the skin 3 (three) times daily with meals. If eating and Blood Glucose (BG) 80 or higher inject 5 units for meal coverage and add correction dose per scale. If not eating, correction dose only. BG <150= 0 unit; BG 150-200= 1 unit; BG 201-250= 2 unit; BG 251-300= 3 unit; BG 301-350= 4 unit; BG 351-400= 5 unit; BG >400= 6 unit and Call Primary care. 03/07/23  Yes Wieting, Richard, MD  JARDIANCE 25 MG TABS tablet Take 25 mg by mouth daily. 12/23/22  Yes [provider]  lidocaine-prilocaine (EMLA) cream Apply on the port. 30 -45 min  prior to port access. 03/12/23  Yes Tony Coder, MD  losartan (COZAAR) 100 MG tablet Take 100 mg by mouth daily. 08/15/17  Yes [provider]  metoprolol tartrate (LOPRESSOR) 50 MG tablet Take 1 tablet (50 mg total) by mouth 2 (two) times daily. 03/31/23  Yes Tony Coder, MD  mometasone-formoterol (DULERA) 100-5 MCG/ACT AERO Inhale 2 puffs into the lungs 2 (two) times daily as needed for wheezing or shortness of breath.   Yes [provider]  naloxone [facility]) nasal spray 4 mg/0.1 mL One spray in nostril for unresponsiveness 03/07/23  Yes Wieting, Richard, MD  OLANZapine (ZYPREXA) 5 MG tablet Take 1 tablet (5 mg total) by mouth at bedtime. 03/26/23  Yes Tony Coder, MD  Omega-3 Fatty Acids (FISH OIL) 1000 MG CAPS Take 1,000 mg by mouth daily.   Yes [provider]  RYBELSUS 7 MG TABS Take 1 tablet by mouth daily. 03/19/23  Yes [provider]   feeding supplement (ENSURE ENLIVE / ENSURE PLUS) LIQD Take 237 mLs by mouth 2 (two) times daily between meals. 03/07/23   Tony Highland, MD  ondansetron (ZOFRAN) 8 MG tablet One pill every 8 hours as needed for nausea/vomitting. Patient not taking: Reported on 04/01/2023 03/12/23   Tony Coder, MD  prochlorperazine (COMPAZINE) 10 MG tablet Take 1 tablet (10 mg total) by mouth every 6 (six) hours as needed for nausea or vomiting. Patient not taking: Reported on 04/01/2023 03/12/23   Tony Coder, MD    Inpatient Medications: Scheduled Meds:  amLODipine  10 mg Oral Daily   atorvastatin  10 mg Oral Daily   empagliflozin  25 mg Oral Daily   feeding supplement  237 mL Oral BID BM   [START ON 04/02/2023] fentaNYL  1 patch Transdermal Q72H   guaiFENesin  600 mg Oral BID   ipratropium-albuterol  3 mL Nebulization QID   losartan  100 mg Oral Daily   metoprolol tartrate  50 mg Oral BID   OLANZapine  5 mg  Oral QHS   omega-3 acid ethyl esters  1 g Oral Daily   Continuous Infusions:  sodium chloride 75 mL/hr at 04/01/23 0349   azithromycin 500 mg (04/01/23 1610)   cefTRIAXone (ROCEPHIN)  IV Stopped (04/01/23 0636)   PRN Meds: acetaminophen **OR** acetaminophen, ALPRAZolam, chlorpheniramine-HYDROcodone, HYDROmorphone, magnesium hydroxide, naloxone, ondansetron **OR** ondansetron (ZOFRAN) IV, traZODone  Allergies:    Allergies  Allergen Reactions   Other Anaphylaxis    peanuts   Morphine And Codeine Itching   Tree Extract Other (See Comments)    ALMONDS  ---- EYE/ FACIAL SWELLING   Wound Dressing Adhesive Rash    Social History:   Social History   Socioeconomic History   Marital status: Married    Spouse name: Not on file   Number of children: Not on file   Years of education: Not on file   Highest education level: Not on file  Occupational History   Occupation: Automotive engineer   Tobacco Use   Smoking status: Never   Smokeless tobacco: Never  Vaping Use    Vaping status: Never Used  Substance and Sexual Activity   Alcohol use: No   Drug use: No   Sexual activity: Not on file  Other Topics Concern   Not on file  Social History Narrative   Not on file   Social Drivers of Health   Financial Resource Strain: Not on file  Food Insecurity: No Food Insecurity (02/28/2023)   Hunger Vital Sign    Worried About Running Out of Food in the Last Year: Never true    Ran Out of Food in the Last Year: Never true  Transportation Needs: No Transportation Needs (02/28/2023)   PRAPARE - Administrator, Civil Service (Medical): No    Lack of Transportation (Non-Medical): No  Physical Activity: Not on file  Stress: Not on file  Social Connections: Not on file  Intimate Partner Violence: Not At Risk (02/28/2023)   Humiliation, Afraid, Rape, and Kick questionnaire    Fear of Current or Ex-Partner: No    Emotionally Abused: No    Physically Abused: No    Sexually Abused: No    Family History:    Family History  Problem Relation Age of Onset   Allergies Sister      ROS:  Please see the history of present illness.   Physical Exam/Data:   Vitals:   04/01/23 0114 04/01/23 0200 04/01/23 0300 04/01/23 0732  BP: (!) 114/57 112/60 (!) 100/49   Pulse: (!) 110 (!) 107 91   Resp: 18  16   Temp: 98.9 F (37.2 C)   98.5 F (36.9 C)  TempSrc: Oral   Oral  SpO2: 100%  100%   Weight:      Height:       No intake or output data in the 24 hours ending 04/01/23 0734    03/31/2023    7:10 PM 03/26/2023    8:32 AM 03/24/2023    1:42 PM  Last 3 Weights  Weight (lbs) 149 lb 0.5 oz 149 lb 3.2 oz 150 lb  Weight (kg) 67.6 kg 67.677 kg 68.04 kg     Body mass index is 21.38 kg/m.  General:  Well nourished, well developed, in no acute distress Neck: no JVD Cardiac:  normal S1, S2; RRR; no murmur  Lungs:  clear to auscultation bilaterally, no wheezing, rhonchi or rales  Abd: soft, nontender, no hepatomegaly  Ext: no edema Skin: warm and  dry  Psych:  Normal affect   EKG:  The EKG was personally reviewed and demonstrates:  sinus tachycardia 123 with right axis deviation and low voltage QRS  Relevant CV Studies: Repeat echo ordered  03/04/23 Echocardiogram 1. Left ventricular ejection fraction, by estimation, is >55%. The left  ventricle has normal function. The left ventricle has no regional wall  motion abnormalities.   2. Right ventricular systolic function is normal. The right ventricular  size is normal.   3. Moderate pericardial effusion. The pericardial effusion is  circumferential. There is no evidence of cardiac tamponade.   4. The mitral valve is normal in structure.   03/01/23 Echocardiogram 1. Left ventricular ejection fraction, by estimation, is 60 to 65%. The  left ventricle has normal function. The left ventricle has no regional  wall motion abnormalities. Left ventricular diastolic parameters are  indeterminate.   2. Right ventricular systolic function is normal. The right ventricular  size is normal. There is moderately elevated pulmonary artery systolic  pressure. The estimated right ventricular systolic pressure is 54.0 mmHg.   3. Large pericardial effusion. The pericardial effusion is  circumferential. There is no evidence of cardiac tamponade. 1.95 off the  RV free wall, 2.16 cm off the apical region, 1.70 cm off the LV free wall.  IVC is not dilated at 1.16 cm   4. The mitral valve is normal in structure. No evidence of mitral valve  regurgitation. No evidence of mitral stenosis.   5. The aortic valve is tricuspid. Aortic valve regurgitation is not  visualized. No aortic stenosis is present.   6. The inferior vena cava is normal in size with greater than 50%  respiratory variability, suggesting right atrial pressure of 3 mmHg.   Laboratory Data:  High Sensitivity Troponin:   Recent Labs  Lab 03/31/23 2200 04/01/23 0353  TROPONINIHS 19* 38*     Chemistry Recent Labs  Lab  03/26/23 0813 03/31/23 2200 04/01/23 0547  NA 136 139 142  K 4.6 4.5 4.4  CL 105 109 111  CO2 25 19* 23  GLUCOSE 273* 198* 167*  BUN 19 31* 29*  CREATININE 0.52* 0.76 0.74  CALCIUM 10.0 9.0 8.8*  GFRNONAA >60 >60 >60  ANIONGAP 6 11 8     Recent Labs  Lab 03/26/23 0813 03/31/23 2200  PROT 6.3* 6.5  ALBUMIN 2.2* 2.1*  AST 22 23  ALT 19 28  ALKPHOS 591* 670*  BILITOT 0.7 1.2*   Lipids No results for input(s): "CHOL", "TRIG", "HDL", "LABVLDL", "LDLCALC", "CHOLHDL" in the last 168 hours.  Hematology Recent Labs  Lab 03/26/23 0813 03/31/23 2200 04/01/23 0547  WBC 17.2* 8.7 9.9  RBC 3.19* 2.88* 2.46*  HGB 8.4* 7.5* 6.5*  HCT 27.6* 25.3* 21.8*  MCV 86.5 87.8 88.6  MCH 26.3 26.0 26.4  MCHC 30.4 29.6* 29.8*  RDW 16.5* 16.6* 16.8*  PLT 567* 201 161   Thyroid No results for input(s): "TSH", "FREET4" in the last 168 hours.  BNPNo results for input(s): "BNP", "PROBNP" in the last 168 hours.  DDimer No results for input(s): "DDIMER" in the last 168 hours.   Radiology/Studies:  CT Angio Chest PE W/Cm &/Or Wo Cm Result Date: 04/01/2023 CLINICAL DATA:  Cough and fever for 3 days. On chemo for pancreatic cancer. Elevated heart rate. PE suspected. EXAM: CT ANGIOGRAPHY CHEST WITH CONTRAST TECHNIQUE: Multidetector CT imaging of the chest was performed using the standard protocol during bolus administration of intravenous contrast. Multiplanar CT image reconstructions and MIPs were obtained to evaluate the vascular  anatomy. RADIATION DOSE REDUCTION: This exam was performed according to the departmental dose-optimization program which includes automated exposure control, adjustment of the mA and/or kV according to patient size and/or use of iterative reconstruction technique. CONTRAST:  75mL OMNIPAQUE IOHEXOL 350 MG/ML SOLN COMPARISON:  Same day chest radiograph and CTA chest 04/28/2022 FINDINGS: Cardiovascular: Right IJ CVC tip at the superior cavoatrial junction. Negative for acute  pulmonary embolism. Large pericardial effusion increased compared to 02/27/2023. Clinical correlation recommended to exclude tamponade physiology. Mild aortic atherosclerotic calcification. Mediastinum/Nodes: Debris layering within the posterior trachea. Esophagus is unremarkable. Surgical clips adjacent to the lower esophagus. Mediastinal and hilar adenopathy is not well evaluated. Lungs/Pleura: Diffuse bronchial wall thickening scattered mucous plugging greatest in the right lower lobe. Bilateral centrilobular micro nodules and tree-in-bud opacities with more confluence opacities in the right lung. Irregular pleural thickening or loculated pleural effusion in the lower right chest has increased compared to 02/27/2023. There is associated pleural nodularity in the posterior lower lobe. For example 14 x 9 mm nodule on series 5/image 97. Pleural calcifications in the posterior right lung base. No pneumothorax. Upper Abdomen: Incompletely evaluated large metastasis in the right hepatic lobe appears slightly increased compared to 02/27/2023, measuring up to 14.5 cm previously 13.3 cm. Partially visualized dilated main pancreatic duct. Pancreatic head is not included in the exam. Small amount of perisplenic ascites. Musculoskeletal: No acute fracture or destructive osseous lesion. Review of the MIP images confirms the above findings. IMPRESSION: 1. Negative for acute pulmonary embolism. 2. Large pericardial effusion increased compared to 02/27/2023. Clinical correlation recommended to exclude tamponade physiology. 3. Increased bilateral centrilobular micro nodules and tree-in-bud opacities with more confluence opacities in the right lung, likely due to bronchopneumonia from aspiration. Lymphangitic spread of tumor could appear similarly. 4. Irregular pleural thickening or loculated pleural effusion in the lower right chest has increased compared to 02/27/2023. There is associated pleural nodularity in the posterior  lower lobe. This may be due to bronchopneumonia though metastases are not excluded. 5. 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 Aortic Atherosclerosis (ICD10-I70.0). Electronically Signed   By: Minerva Fester M.D.   On: 04/01/2023 00:53   DG Chest 2 View Result Date: 03/31/2023 CLINICAL DATA:  Cough and fever EXAM: CHEST - 2 VIEW COMPARISON:  Chest x-ray 03/05/2023.  Chest CT 02/27/2023. FINDINGS: There is a new right chest port with distal catheter tip projecting over the distal SVC. The cardiac silhouette appears mildly enlarged, unchanged. Small right pleural effusion persists. Patchy opacities in the right lung base have decreased, but have not completely resolved. There is a new focal nodular density in the left mid lung measuring 8 mm. The left lung is otherwise clear. There is no pneumothorax or acute fracture. IMPRESSION: 1. Small right pleural effusion persists. 2. Patchy opacities in the right lung base have decreased, but have not completely resolved. 3. New 8 mm nodular density in the left mid lung. Recommend follow-up chest CT. Electronically Signed   By: Darliss Cheney M.D.   On: 03/31/2023 21:10     Assessment and Plan:   Pericardial effusion - First noted on CT angiogram 02/27/2023. Echocardiogram on 03/01/23 showed large circumferential pericardial effusion, repeat echocardiogram on 03/04/23 showed moderate circumferential pericardial effusion, both without evidence of tamponade physiology - CT angiogram 03/31/2023 showed pericardial effusion increased compared to 02/27/2023 - Suspect secondary to metastatic pancreatic cancer - Hold Eliquis - Repeat echo, further recommendations pending results - Not a good candidate for invasive procedures in the setting of  metastatic pancreatic cancer and acute anemia  Elevated troponin - Troponin 19>>38 - Suspect demand ischemia - Denies chest pain, EKG without ischemic changes  Hx DVT/PE - Holding Eliquis due to anemia/pericardial  effusion  Acute anemia - Hgb 7.5>>6.5 - Hold Eliquis - Recommend transfusion - Management per IM  Metastatic pancreatic cancer - Management per IM  For questions or updates, please contact [facility] Please consult [email or web address] for contact info under    Signed, Orion Crook, PA-C  04/01/2023 7:34 AM

## 2023-04-01 NOTE — ED Notes (Signed)
Unable to start another bag of blood at this time due to blood bank awaiting answer from MD on transfusion reaction workup. MD made aware of blood bank not releasing blood for patient until a decision is made

## 2023-04-01 NOTE — Progress Notes (Signed)
  Progress Note   Patient: TYWAYNE SHALLENBERGER JYN:[id number] DOB: Sep 07, 1960 DOA: 03/31/2023     0 DOS: the patient was seen and examined on 04/01/2023    Courtesy notes/nonbillable  Brief hospital course: DERREL LITTEKEN is a 62 y.o. male with medical history significant for metastatic pancreatic cancer on palliative chemotherapy, asthma, prostate cancer, type 2 diabetes mellitus, hypertension, dyslipidemia, and PE on Eliquis, who presented to the emergency room with acute onset of dyspnea with associated cough productive of yellow sputum without wheezing.  Today patient was found to have hemoglobin 6.5.  He agreed for blood transfusion however during transfusion he developed worsening shortness of breath with tachycardia.  Transfusion was aborted and blood return to blood bank for transfusion reaction workup. Patient has also been seen by cardiologist and awaiting repeat echo before decision is made on pericardial effusion.  For detailed assessment and plan please refer to H&P as outlined by Dr. Arville Care this morning  Vitals:   04/01/23 1330 04/01/23 1400 04/01/23 1430 04/01/23 1500  BP: (!) 115/56 (!) 97/58 (!) 93/58 93/61  Pulse: 98 (!) 103 92 90  Resp: 19 (!) 23 13 12   Temp:      TempSrc:      SpO2: 100% 100% 100% 100%  Weight:      Height:         Author: Loyce Dys, MD 04/01/2023 5:16 PM  For on call review [email or web address].

## 2023-04-01 NOTE — Assessment & Plan Note (Addendum)
-   This is multifactorial due to his large pericardial effusion, symptomatic anemia, pleural effusion, possible bronchopneumonia and lung metastasis. - The patient be admitted to a medical telemetry observation bed. - Will continue monitoring H&H. - We will place him on IV Rocephin and Zithromax. - Mucolytic therapy will be provided. - Cardiology consulted 2D echo be obtained given his pericardial effusion. - I notified [facility] group about the patient.

## 2023-04-01 NOTE — Assessment & Plan Note (Signed)
-   We will continue statin therapy.  Elevated alk phos is due to his metastatic pancreatic cancer rather than statin therapy.

## 2023-04-01 NOTE — Assessment & Plan Note (Signed)
-   We will continue anti-hypertensive therapy. 

## 2023-04-01 NOTE — ED Notes (Signed)
Patient set up with breakfast tray 

## 2023-04-02 ENCOUNTER — Encounter: Payer: Self-pay | Admitting: Internal Medicine

## 2023-04-02 ENCOUNTER — Inpatient Hospital Stay: Payer: BC Managed Care – PPO

## 2023-04-02 DIAGNOSIS — J9601 Acute respiratory failure with hypoxia: Secondary | ICD-10-CM | POA: Diagnosis not present

## 2023-04-02 DIAGNOSIS — J189 Pneumonia, unspecified organism: Secondary | ICD-10-CM | POA: Diagnosis not present

## 2023-04-02 DIAGNOSIS — Z86711 Personal history of pulmonary embolism: Secondary | ICD-10-CM | POA: Diagnosis not present

## 2023-04-02 DIAGNOSIS — D649 Anemia, unspecified: Secondary | ICD-10-CM

## 2023-04-02 DIAGNOSIS — C259 Malignant neoplasm of pancreas, unspecified: Secondary | ICD-10-CM | POA: Diagnosis not present

## 2023-04-02 DIAGNOSIS — I2699 Other pulmonary embolism without acute cor pulmonale: Secondary | ICD-10-CM

## 2023-04-02 DIAGNOSIS — I3131 Malignant pericardial effusion in diseases classified elsewhere: Secondary | ICD-10-CM | POA: Diagnosis not present

## 2023-04-02 LAB — CBC WITH DIFFERENTIAL/PLATELET
Abs Immature Granulocytes: 0.26 10*3/uL — ABNORMAL HIGH (ref 0.00–0.07)
Basophils Absolute: 0.1 10*3/uL (ref 0.0–0.1)
Basophils Relative: 0 %
Eosinophils Absolute: 0.3 10*3/uL (ref 0.0–0.5)
Eosinophils Relative: 2 %
HCT: 28 % — ABNORMAL LOW (ref 39.0–52.0)
Hemoglobin: 8.7 g/dL — ABNORMAL LOW (ref 13.0–17.0)
Immature Granulocytes: 2 %
Lymphocytes Relative: 1 %
Lymphs Abs: 0.2 10*3/uL — ABNORMAL LOW (ref 0.7–4.0)
MCH: 26 pg (ref 26.0–34.0)
MCHC: 31.1 g/dL (ref 30.0–36.0)
MCV: 83.6 fL (ref 80.0–100.0)
Monocytes Absolute: 0.4 10*3/uL (ref 0.1–1.0)
Monocytes Relative: 3 %
Neutro Abs: 13.5 10*3/uL — ABNORMAL HIGH (ref 1.7–7.7)
Neutrophils Relative %: 92 %
Platelets: 170 10*3/uL (ref 150–400)
RBC: 3.35 MIL/uL — ABNORMAL LOW (ref 4.22–5.81)
RDW: 18.2 % — ABNORMAL HIGH (ref 11.5–15.5)
WBC: 14.8 10*3/uL — ABNORMAL HIGH (ref 4.0–10.5)
nRBC: 0 % (ref 0.0–0.2)

## 2023-04-02 LAB — BASIC METABOLIC PANEL
Anion gap: 8 (ref 5–15)
BUN: 20 mg/dL (ref 8–23)
CO2: 25 mmol/L (ref 22–32)
Calcium: 8.8 mg/dL — ABNORMAL LOW (ref 8.9–10.3)
Chloride: 109 mmol/L (ref 98–111)
Creatinine, Ser: 0.57 mg/dL — ABNORMAL LOW (ref 0.61–1.24)
GFR, Estimated: >60 mL/min (ref >60)
Glucose, Bld: 193 mg/dL — ABNORMAL HIGH (ref 70–99)
Potassium: 4.3 mmol/L (ref 3.5–5.1)
Sodium: 142 mmol/L (ref 135–145)

## 2023-04-02 LAB — GLUCOSE, CAPILLARY: Glucose-Capillary: 256 mg/dL — ABNORMAL HIGH (ref 70–99)

## 2023-04-02 LAB — PROTIME-INR
INR: 1.6 — ABNORMAL HIGH (ref 0.8–1.2)
Prothrombin Time: 19.2 s — ABNORMAL HIGH (ref 11.4–15.2)

## 2023-04-02 LAB — APTT: aPTT: 50 s — ABNORMAL HIGH (ref 24–36)

## 2023-04-02 LAB — HEPARIN LEVEL (UNFRACTIONATED): Heparin Unfractionated: 0.89 [IU]/mL — ABNORMAL HIGH (ref 0.30–0.70)

## 2023-04-02 MED ORDER — HEPARIN (PORCINE) 25000 UT/250ML-% IV SOLN
1350.0000 [IU]/h | INTRAVENOUS | Status: DC
  Administered 2023-04-02: 1150 [IU]/h via INTRAVENOUS
  Administered 2023-04-04: 1350 [IU]/h via INTRAVENOUS
  Filled 2023-04-02 (×3): qty 250

## 2023-04-02 MED ORDER — CHLORHEXIDINE GLUCONATE CLOTH 2 % EX PADS
6.0000 | MEDICATED_PAD | Freq: Every day | CUTANEOUS | Status: DC
  Administered 2023-04-03 – 2023-04-04 (×3): 6 via TOPICAL

## 2023-04-02 NOTE — Progress Notes (Signed)
Rounding Note    Patient Name: Tony Benson Date of Encounter: 04/02/2023  [facility] Cardiologist: Yvonne Kendall, MD   Subjective   Patient is seen on AM rounds. Echo 12/17 showed pericardial effusion slightly enlarged from previous.   Inpatient Medications    Scheduled Meds:  sodium chloride   Intravenous Once   amLODipine  10 mg Oral Daily   atorvastatin  10 mg Oral Daily   empagliflozin  25 mg Oral Daily   feeding supplement  237 mL Oral BID BM   fentaNYL  1 patch Transdermal Q72H   guaiFENesin  15 mL Oral Q6H   ipratropium-albuterol  3 mL Nebulization QID   metoprolol tartrate  50 mg Oral BID   OLANZapine  5 mg Oral QHS   omega-3 acid ethyl esters  1 g Oral Daily   Continuous Infusions:  azithromycin 500 mg (04/02/23 0701)   cefTRIAXone (ROCEPHIN)  IV Stopped (04/02/23 0630)   PRN Meds: acetaminophen **OR** acetaminophen, ALPRAZolam, chlorpheniramine-HYDROcodone, HYDROmorphone, magnesium hydroxide, naloxone, ondansetron **OR** ondansetron (ZOFRAN) IV, traZODone   Vital Signs    Vitals:   04/02/23 0339 04/02/23 0400 04/02/23 0700 04/02/23 0754  BP: 98/60 93/61 (!) 98/58   Pulse: (!) 101 97 (!) 112   Resp: (!) 22 16 18    Temp: 98.7 F (37.1 C)   98.1 F (36.7 C)  TempSrc: Oral   Oral  SpO2: 100% 100% 94%   Weight:      Height:        Intake/Output Summary (Last 24 hours) at 04/02/2023 0925 Last data filed at 04/02/2023 0320 Gross per 24 hour  Intake 671.35 ml  Output --  Net 671.35 ml      03/31/2023    7:10 PM 03/26/2023    8:32 AM 03/24/2023    1:42 PM  Last 3 Weights  Weight (lbs) 149 lb 0.5 oz 149 lb 3.2 oz 150 lb  Weight (kg) 67.6 kg 67.677 kg 68.04 kg      Physical Exam   GEN: No acute distress.   Neck: No JVD Cardiac: Tachycardic, regular rhythm, no murmurs, rubs, or gallops.  Respiratory: Clear to auscultation bilaterally. GI: Soft, nontender, non-distended  MS: 1-2+ LE edema; No deformity. Neuro:  Nonfocal   Psych: Normal affect   Labs    High Sensitivity Troponin:   Recent Labs  Lab 03/31/23 2200 04/01/23 0353  TROPONINIHS 19* 38*     Chemistry Recent Labs  Lab 03/31/23 2200 04/01/23 0547 04/02/23 0547  NA 139 142 142  K 4.5 4.4 4.3  CL 109 111 109  CO2 19* 23 25  GLUCOSE 198* 167* 193*  BUN 31* 29* 20  CREATININE 0.76 0.74 0.57*  CALCIUM 9.0 8.8* 8.8*  PROT 6.5  --   --   ALBUMIN 2.1*  --   --   AST 23  --   --   ALT 28  --   --   ALKPHOS 670*  --   --   BILITOT 1.2*  --   --   GFRNONAA >60 >60 >60  ANIONGAP 11 8 8     Lipids No results for input(s): "CHOL", "TRIG", "HDL", "LABVLDL", "LDLCALC", "CHOLHDL" in the last 168 hours.  Hematology Recent Labs  Lab 03/31/23 2200 04/01/23 0547 04/02/23 0547  WBC 8.7 9.9 14.8*  RBC 2.88* 2.46* 3.35*  HGB 7.5* 6.5* 8.7*  HCT 25.3* 21.8* 28.0*  MCV 87.8 88.6 83.6  MCH 26.0 26.4 26.0  MCHC 29.6* 29.8* 31.1  RDW 16.6* 16.8* 18.2*  PLT 201 161 170   Thyroid No results for input(s): "TSH", "FREET4" in the last 168 hours.  BNPNo results for input(s): "BNP", "PROBNP" in the last 168 hours.  DDimer No results for input(s): "DDIMER" in the last 168 hours.   Radiology    ECHOCARDIOGRAM COMPLETE Result Date: 04/01/2023    ECHOCARDIOGRAM REPORT   Patient Name:   DATWAN MUSSELWHITE Date of Exam: 04/01/2023 Medical Rec #:  [id number]     Height:       70.0 in Accession #:    [id number]    Weight:       149.0 lb Date of Birth:  Sep 15, 1960    BSA:          1.842 m Patient Age:    62 years      BP:           109/52 mmHg Patient Gender: M             HR:           100 bpm. Exam Location:  [facility] Procedure: 2D Echo, Cardiac Doppler and Color Doppler Indications:     Pericardial Effusion I31.3  History:         Patient has prior history of Echocardiogram examinations, most                  recent 03/04/2023. Risk Factors:Diabetes, Hypertension and                  Sleep Apnea.  Sonographer:     Cristela Blue Referring Phys:  [id number] JAN A MANSY  Diagnosing Phys: Yvonne Kendall MD IMPRESSIONS  1. Left ventricular ejection fraction, by estimation, is 60 to 65%. The left ventricle has normal function. The left ventricle has no regional wall motion abnormalities. There is moderate left ventricular hypertrophy. Left ventricular diastolic parameters are consistent with Grade II diastolic dysfunction (pseudonormalization).  2. Right ventricular systolic function is normal. The right ventricular size is normal. There is mildly elevated pulmonary artery systolic pressure.  3. There is equivocal early diastolic collapse of the right ventricle. However, mitral and tricuspid valve inflow velocity respiratory variation is not consistent with tamponade physiology.. Large pericardial effusion. The pericardial effusion is circumferential.  4. The mitral valve is normal in structure. Trivial mitral valve regurgitation. No evidence of mitral stenosis.  5. The aortic valve is tricuspid. Aortic valve regurgitation is not visualized. No aortic stenosis is present.  6. The inferior vena cava is normal in size with <50% respiratory variability, suggesting right atrial pressure of 8 mmHg. Comparison(s): A prior study was performed on 03/04/2023. The pericardial effusion appears somewhat larger. FINDINGS  Left Ventricle: Left ventricular ejection fraction, by estimation, is 60 to 65%. The left ventricle has normal function. The left ventricle has no regional wall motion abnormalities. The left ventricular internal cavity size was normal in size. There is  moderate left ventricular hypertrophy. Left ventricular diastolic parameters are consistent with Grade II diastolic dysfunction (pseudonormalization). Right Ventricle: The right ventricular size is normal. No increase in right ventricular wall thickness. Right ventricular systolic function is normal. There is mildly elevated pulmonary artery systolic pressure. The tricuspid regurgitant velocity is 3.00  m/s, and with an assumed  right atrial pressure of 8 mmHg, the estimated right ventricular systolic pressure is 44.0 mmHg. Left Atrium: Left atrial size was normal in size. Right Atrium: Right atrial size was normal in size. Pericardium: There is equivocal early diastolic collapse  of the right ventricle. However, mitral and tricuspid valve inflow velocity respiratory variation is not consistent with tamponade physiology. A large pericardial effusion is present. The pericardial effusion is circumferential. Mitral Valve: The mitral valve is normal in structure. Trivial mitral valve regurgitation. No evidence of mitral valve stenosis. MV peak gradient, 4.8 mmHg. The mean mitral valve gradient is 3.0 mmHg. Tricuspid Valve: The tricuspid valve is normal in structure. Tricuspid valve regurgitation is mild. Aortic Valve: The aortic valve is tricuspid. Aortic valve regurgitation is not visualized. No aortic stenosis is present. Aortic valve mean gradient measures 4.0 mmHg. Aortic valve peak gradient measures 6.0 mmHg. Aortic valve area, by VTI measures 2.91 cm. Pulmonic Valve: The pulmonic valve was grossly normal. Pulmonic valve regurgitation is trivial. No evidence of pulmonic stenosis. Aorta: The aortic root is normal in size and structure. Pulmonary Artery: The pulmonary artery is not well seen. Venous: The inferior vena cava is normal in size with less than 50% respiratory variability, suggesting right atrial pressure of 8 mmHg. IAS/Shunts: No atrial level shunt detected by color flow Doppler.  LEFT VENTRICLE PLAX 2D LVIDd:         4.10 cm   Diastology LVIDs:         1.80 cm   LV e' medial:    6.42 cm/s LV PW:         1.50 cm   LV E/e' medial:  13.8 LV IVS:        1.20 cm   LV e' lateral:   15.00 cm/s LVOT diam:     2.10 cm   LV E/e' lateral: 5.9 LV SV:         58 LV SV Index:   32 LVOT Area:     3.46 cm  RIGHT VENTRICLE RV Basal diam:  2.90 cm RV Mid diam:    2.50 cm RV S prime:     20.90 cm/s TAPSE (M-mode): 1.8 cm LEFT ATRIUM            Index        RIGHT ATRIUM          Index LA diam:      2.40 cm 1.30 cm/m   RA Area:     9.46 cm LA Vol (A4C): 20.3 ml 11.02 ml/m  RA Volume:   14.30 ml 7.76 ml/m  AORTIC VALVE AV Area (Vmax):    2.70 cm AV Area (Vmean):   2.44 cm AV Area (VTI):     2.91 cm AV Vmax:           122.00 cm/s AV Vmean:          90.900 cm/s AV VTI:            0.200 m AV Peak Grad:      6.0 mmHg AV Mean Grad:      4.0 mmHg LVOT Vmax:         95.00 cm/s LVOT Vmean:        64.000 cm/s LVOT VTI:          0.168 m LVOT/AV VTI ratio: 0.84  AORTA Ao Root diam: 3.05 cm MITRAL VALVE               TRICUSPID VALVE MV Area (PHT): 4.57 cm    TR Peak grad:   36.0 mmHg MV Area VTI:   2.32 cm    TR Vmax:        300.00 cm/s MV Peak grad:  4.8 mmHg MV  Mean grad:  3.0 mmHg    SHUNTS MV Vmax:       1.09 m/s    Systemic VTI:  0.17 m MV Vmean:      80.1 cm/s   Systemic Diam: 2.10 cm MV Decel Time: 166 msec MV E velocity: 88.60 cm/s MV A velocity: 78.10 cm/s MV E/A ratio:  1.13 Yvonne Kendall MD Electronically signed by Yvonne Kendall MD Signature Date/Time: 04/01/2023/2:19:37 PM    Final    CT Angio Chest PE W/Cm &/Or Wo Cm Result Date: 04/01/2023 CLINICAL DATA:  Cough and fever for 3 days. On chemo for pancreatic cancer. Elevated heart rate. PE suspected. EXAM: CT ANGIOGRAPHY CHEST WITH CONTRAST TECHNIQUE: Multidetector CT imaging of the chest was performed using the standard protocol during bolus administration of intravenous contrast. Multiplanar CT image reconstructions and MIPs were obtained to evaluate the vascular anatomy. RADIATION DOSE REDUCTION: This exam was performed according to the departmental dose-optimization program which includes automated exposure control, adjustment of the mA and/or kV according to patient size and/or use of iterative reconstruction technique. CONTRAST:  75mL OMNIPAQUE IOHEXOL 350 MG/ML SOLN COMPARISON:  Same day chest radiograph and CTA chest 04/28/2022 FINDINGS: Cardiovascular: Right IJ CVC tip at the  superior cavoatrial junction. Negative for acute pulmonary embolism. Large pericardial effusion increased compared to 02/27/2023. Clinical correlation recommended to exclude tamponade physiology. Mild aortic atherosclerotic calcification. Mediastinum/Nodes: Debris layering within the posterior trachea. Esophagus is unremarkable. Surgical clips adjacent to the lower esophagus. Mediastinal and hilar adenopathy is not well evaluated. Lungs/Pleura: Diffuse bronchial wall thickening scattered mucous plugging greatest in the right lower lobe. Bilateral centrilobular micro nodules and tree-in-bud opacities with more confluence opacities in the right lung. Irregular pleural thickening or loculated pleural effusion in the lower right chest has increased compared to 02/27/2023. There is associated pleural nodularity in the posterior lower lobe. For example 14 x 9 mm nodule on series 5/image 97. Pleural calcifications in the posterior right lung base. No pneumothorax. Upper Abdomen: Incompletely evaluated large metastasis in the right hepatic lobe appears slightly increased compared to 02/27/2023, measuring up to 14.5 cm previously 13.3 cm. Partially visualized dilated main pancreatic duct. Pancreatic head is not included in the exam. Small amount of perisplenic ascites. Musculoskeletal: No acute fracture or destructive osseous lesion. Review of the MIP images confirms the above findings. IMPRESSION: 1. Negative for acute pulmonary embolism. 2. Large pericardial effusion increased compared to 02/27/2023. Clinical correlation recommended to exclude tamponade physiology. 3. Increased bilateral centrilobular micro nodules and tree-in-bud opacities with more confluence opacities in the right lung, likely due to bronchopneumonia from aspiration. Lymphangitic spread of tumor could appear similarly. 4. Irregular pleural thickening or loculated pleural effusion in the lower right chest has increased compared to 02/27/2023. There is  associated pleural nodularity in the posterior lower lobe. This may be due to bronchopneumonia though metastases are not excluded. 5. 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 Aortic Atherosclerosis (ICD10-I70.0). Electronically Signed   By: Minerva Fester M.D.   On: 04/01/2023 00:53   DG Chest 2 View Result Date: 03/31/2023 CLINICAL DATA:  Cough and fever EXAM: CHEST - 2 VIEW COMPARISON:  Chest x-ray 03/05/2023.  Chest CT 02/27/2023. FINDINGS: There is a new right chest port with distal catheter tip projecting over the distal SVC. The cardiac silhouette appears mildly enlarged, unchanged. Small right pleural effusion persists. Patchy opacities in the right lung base have decreased, but have not completely resolved. There is a new focal nodular density in the left mid  lung measuring 8 mm. The left lung is otherwise clear. There is no pneumothorax or acute fracture. IMPRESSION: 1. Small right pleural effusion persists. 2. Patchy opacities in the right lung base have decreased, but have not completely resolved. 3. New 8 mm nodular density in the left mid lung. Recommend follow-up chest CT. Electronically Signed   By: Darliss Cheney M.D.   On: 03/31/2023 21:10    Cardiac Studies   04/01/23 Echocardiogram 1. Left ventricular ejection fraction, by estimation, is 60 to 65%. The  left ventricle has normal function. The left ventricle has no regional  wall motion abnormalities. There is moderate left ventricular hypertrophy.  Left ventricular diastolic  parameters are consistent with Grade II diastolic dysfunction  (pseudonormalization).   2. Right ventricular systolic function is normal. The right ventricular  size is normal. There is mildly elevated pulmonary artery systolic  pressure.   3. There is equivocal early diastolic collapse of the right ventricle.  However, mitral and tricuspid valve inflow velocity respiratory variation  is not consistent with tamponade physiology.. Large pericardial  effusion.  The pericardial effusion is  circumferential.   4. The mitral valve is normal in structure. Trivial mitral valve  regurgitation. No evidence of mitral stenosis.   5. The aortic valve is tricuspid. Aortic valve regurgitation is not  visualized. No aortic stenosis is present.   6. The inferior vena cava is normal in size with <50% respiratory  variability, suggesting right atrial pressure of 8 mmHg.   Comparison(s): A prior study was performed on 03/04/2023. The pericardial  effusion appears somewhat larger.   03/04/23 Echocardiogram 1. Left ventricular ejection fraction, by estimation, is >55%. The left  ventricle has normal function. The left ventricle has no regional wall  motion abnormalities.   2. Right ventricular systolic function is normal. The right ventricular  size is normal.   3. Moderate pericardial effusion. The pericardial effusion is  circumferential. There is no evidence of cardiac tamponade.   4. The mitral valve is normal in structure.    03/01/23 Echocardiogram 1. Left ventricular ejection fraction, by estimation, is 60 to 65%. The  left ventricle has normal function. The left ventricle has no regional  wall motion abnormalities. Left ventricular diastolic parameters are  indeterminate.   2. Right ventricular systolic function is normal. The right ventricular  size is normal. There is moderately elevated pulmonary artery systolic  pressure. The estimated right ventricular systolic pressure is 54.0 mmHg.   3. Large pericardial effusion. The pericardial effusion is  circumferential. There is no evidence of cardiac tamponade. 1.95 off the  RV free wall, 2.16 cm off the apical region, 1.70 cm off the LV free wall.  IVC is not dilated at 1.16 cm   4. The mitral valve is normal in structure. No evidence of mitral valve  regurgitation. No evidence of mitral stenosis.   5. The aortic valve is tricuspid. Aortic valve regurgitation is not  visualized. No  aortic stenosis is present.   6. The inferior vena cava is normal in size with greater than 50%  respiratory variability, suggesting right atrial pressure of 3 mmHg.   Patient Profile     DADRIAN DIETZ is a 62 y.o. male with a hx of metastatic pancreatic cancer on palliative chemotherapy, asthma, prostate cancer, T2DM, HTN, dyslipidemia, OSA, and hx DVT and PE on Eliquis who is being seen for the continued evaluation of pericardial effusion.   Assessment & Plan    Pericardial effusion - First noted  on CT angiogram 02/27/2023. Echocardiogram on 03/01/23 showed large circumferential pericardial effusion, repeat echocardiogram on 03/04/23 showed moderate circumferential pericardial effusion, both without evidence of tamponade physiology - CT angiogram 03/31/2023 showed pericardial effusion increased compared to 02/27/2023 - Most recent echo 04/01/23 showed pericardial effusion slightly increased compared to previous echo - Suspect secondary to metastatic pancreatic cancer - Hold Eliquis for now, consider starting IV heparin with close monitoring  - Not a good candidate for invasive procedures in the setting of metastatic pancreatic cancer and acute anemia   Elevated troponin - Troponin 19>>38 - Suspect demand ischemia - Denies chest pain, EKG without ischemic changes   Hx DVT/PE - Hold Eliquis for now due to anemia/pericardial effusion, consider starting IV heparin with close monitoring    Acute anemia - Hgb 6.5>>8.7, s/p blood transfusion yesterday - Hold Eliquis - Management per IM   Metastatic pancreatic cancer - Management per IM  For questions or updates, please contact [facility] Please consult [email or web address] for contact info under        Signed, Orion Crook, PA-C  04/02/2023, 9:25 AM

## 2023-04-02 NOTE — ED Notes (Signed)
Request made for transport to the floor ?

## 2023-04-02 NOTE — Progress Notes (Signed)
Progress Note   Patient: Tony Benson DOB: Dec 24, 1960 DOA: 03/31/2023     1 DOS: the patient was seen and examined on 04/02/2023   Brief hospital course: Tony Benson is a 62 y.o. male with medical history significant for metastatic pancreatic cancer on palliative chemotherapy, asthma, prostate cancer, type 2 diabetes mellitus, hypertension, dyslipidemia, and PE on Eliquis, who presented to the emergency room with acute onset of dyspnea with associated cough productive of yellow sputum without wheezing.  Today patient was found to have hemoglobin 6.5.  He agreed for blood transfusion however during transfusion he developed worsening shortness of breath with tachycardia.  Transfusion was aborted and blood return to blood bank for transfusion reaction workup. Patient has also been seen by cardiologist and awaiting repeat echo before decision is made on pericardial effusion.  Assessment and Plan:  Assessment and Plan: Community-acquired pneumonia Large pericardial effusion Pleural effusion and lung metastasis Continue ceftriaxone and azithromycin Continue mucolytic therapy  Plan of care discussed with cardiologist concerning pericardial effusion   Essential hypertension Continue antihypertensive therapy.    Acute anemia likely induced by chemo therapy Status post 1 unit blood transfusion Continue to monitor CBC closely  Type 2 diabetes mellitus without complications (HCC) Continue insulin therapy Monitor glucose closely Continue Jardiance.   History of pulmonary embolism as well as bilateral lower extremity DVT Patient initiated on heparin drip after discussion with cardiologist Takes Eliquis at home   Pancreatic cancer metastasized to liver [facility]) - The patient is undergoing palliative chemotherapy. - Pain management will be provided.   Dyslipidemia Continue statin therapy   Asthma, chronic - The patient will be placed on as needed DuoNebs. - We will hold  off long-acting beta agonist for now.     Subjective:  Patient seen and examined at bedside this morning Denies nausea vomiting abdominal pain chest pain Have had appropriate increase in hemoglobin No acute GI bleeding noted  Physical Exam: GENERAL:  62 y.o.-year-old patient lying in the bed with no acute distress.  EYES: Pupils equal, round, reactive to light and accommodation. No scleral icterus. Extraocular muscles intact.  HEENT: Head atraumatic, normocephalic. Oropharynx and nasopharynx clear.  NECK:  Supple, no jugular venous distention. No thyroid enlargement, no tenderness.  LUNGS: Normal breath sounds bilaterally, no wheezing, rales,rhonchi or crepitation. No use of accessory muscles of respiration.  CARDIOVASCULAR: Regular rate and rhythm, S1, S2 normal. No murmurs, rubs, or gallops.  ABDOMEN: Soft, nondistended, nontender. Bowel sounds present. No organomegaly or mass.  EXTREMITIES: No pedal edema, cyanosis, or clubbing.  NEUROLOGIC: Cranial nerves II through XII are intact.   Vitals:   04/02/23 1200 04/02/23 1345 04/02/23 1635 04/02/23 1720  BP: 121/63  (!) 112/57 101/60  Pulse: (!) 114  99 99  Resp: (!) 22  18   Temp:  99.2 F (37.3 C)  98.9 F (37.2 C)  TempSrc:  Oral    SpO2: 94%  93% 95%  Weight:      Height:        Data Reviewed:    Latest Ref Rng & Units 04/02/2023    5:47 AM 04/01/2023    5:47 AM 03/31/2023   10:00 PM  CBC  WBC 4.0 - 10.5 K/uL 14.8  9.9  8.7   Hemoglobin 13.0 - 17.0 g/dL 8.7  6.5  7.5   Hematocrit 39.0 - 52.0 % 28.0  21.8  25.3   Platelets 150 - 400 K/uL 170  161  201  Latest Ref Rng & Units 04/02/2023    5:47 AM 04/01/2023    5:47 AM 03/31/2023   10:00 PM  BMP  Glucose 70 - 99 mg/dL 161  096  045   BUN 8 - 23 mg/dL 20  29  31    Creatinine 0.61 - 1.24 mg/dL 4.09  8.11  9.14   Sodium 135 - 145 mmol/L 142  142  139   Potassium 3.5 - 5.1 mmol/L 4.3  4.4  4.5   Chloride 98 - 111 mmol/L 109  111  109   CO2 22 - 32 mmol/L  25  23  19    Calcium 8.9 - 10.3 mg/dL 8.8  8.8  9.0     Family Communication: Discussed with patient's wife  Disposition: Status is: Inpatient  Time spent: 56 minutes  Author: Loyce Dys, MD 04/02/2023 5:36 PM  For on call review [email or web address].

## 2023-04-02 NOTE — Consult Note (Signed)
PHARMACY - ANTICOAGULATION CONSULT NOTE  Pharmacy Consult for Heparin  Indication: DVT  Allergies  Allergen Reactions   Other Anaphylaxis    peanuts   Morphine And Codeine Itching   Tree Extract Other (See Comments)    ALMONDS  ---- EYE/ FACIAL SWELLING   Wound Dressing Adhesive Rash   Patient Measurements: Height: 5\' 10"  (177.8 cm) Weight: 67.6 kg (149 lb 0.5 oz) IBW/kg (Calculated) : 73 Heparin Dosing Weight: 67.6 kg   Vital Signs: Temp: 98.9 F (37.2 C) (12/18 1720) Temp Source: Oral (12/18 1345) BP: 101/60 (12/18 1720) Pulse Rate: 99 (12/18 1720)  Labs: Recent Labs    03/31/23 2200 04/01/23 0353 04/01/23 0547 04/02/23 0547  HGB 7.5*  --  6.5* 8.7*  HCT 25.3*  --  21.8* 28.0*  PLT 201  --  161 170  CREATININE 0.76  --  0.74 0.57*  TROPONINIHS 19* 38*  --   --    Estimated Creatinine Clearance: 91.5 mL/min (A) (by C-G formula based on SCr of 0.57 mg/dL (L)).  Medical History: Past Medical History:  Diagnosis Date   Asthma    Cancer (HCC)    prostate   Diabetes mellitus without complication (HCC)    type II   Hyperlipidemia    Hypertension    Sleep apnea    uses Cpap machine    Medications:  On PTA apixaban for VTE treatment - last dose taken 12/17 @ 0348   Assessment: Tony Benson is a 62 year old male with history of small nonocclusive segmental/subsegmental pulmonary emboli in the left upper lobe in November 2024 for which he was started on apixaban. Also found to have bilateral lower extremity DVTs extending into the right femoral through the calf veins and left popliteal through the calf veins. Last dose of apixaban was taken 12/17 @ 0348. Patient presented with hemoglobin or 6.5 so apixaban was held and patient received blood transfusions. Pharmacy has been consulted for initiation and management of a heparin infusion. Baseline labs: Hgb 8.7, PLT 170, aPTT, PT/INR, and HL ordered.   Goal of Therapy:  Heparin level 0.3-0.7 units/ml aPTT 66-102  seconds Monitor platelets by anticoagulation protocol: Yes   Plan:  Per cardio, will avoid initial bolus given anemia  Start heparin infusion at 1150 units/hr  Will monitor aPTT levels assuming HL is falsely elevated given recent apixaban dose  Check aPTT level 6 hours after initiation of infusion Will monitor with aPTT levels until correlation with HL   Monitor CBC daily while on heparin   Tony Benson, PharmD Pharmacy Resident  04/02/2023 5:24 PM

## 2023-04-03 ENCOUNTER — Encounter: Payer: Self-pay | Admitting: Internal Medicine

## 2023-04-03 DIAGNOSIS — L8990 Pressure ulcer of unspecified site, unspecified stage: Secondary | ICD-10-CM | POA: Insufficient documentation

## 2023-04-03 DIAGNOSIS — I3139 Other pericardial effusion (noninflammatory): Secondary | ICD-10-CM

## 2023-04-03 DIAGNOSIS — C259 Malignant neoplasm of pancreas, unspecified: Secondary | ICD-10-CM | POA: Diagnosis not present

## 2023-04-03 DIAGNOSIS — J189 Pneumonia, unspecified organism: Secondary | ICD-10-CM | POA: Diagnosis not present

## 2023-04-03 DIAGNOSIS — R042 Hemoptysis: Secondary | ICD-10-CM | POA: Diagnosis not present

## 2023-04-03 DIAGNOSIS — I2699 Other pulmonary embolism without acute cor pulmonale: Secondary | ICD-10-CM | POA: Diagnosis not present

## 2023-04-03 DIAGNOSIS — Z515 Encounter for palliative care: Secondary | ICD-10-CM | POA: Diagnosis not present

## 2023-04-03 DIAGNOSIS — I3131 Malignant pericardial effusion in diseases classified elsewhere: Secondary | ICD-10-CM | POA: Diagnosis not present

## 2023-04-03 DIAGNOSIS — Z86711 Personal history of pulmonary embolism: Secondary | ICD-10-CM | POA: Diagnosis not present

## 2023-04-03 LAB — CBC WITH DIFFERENTIAL/PLATELET
Abs Immature Granulocytes: 0.19 10*3/uL — ABNORMAL HIGH (ref 0.00–0.07)
Basophils Absolute: 0 10*3/uL (ref 0.0–0.1)
Basophils Relative: 0 %
Eosinophils Absolute: 0.3 10*3/uL (ref 0.0–0.5)
Eosinophils Relative: 2 %
HCT: 32.5 % — ABNORMAL LOW (ref 39.0–52.0)
Hemoglobin: 10.2 g/dL — ABNORMAL LOW (ref 13.0–17.0)
Immature Granulocytes: 1 %
Lymphocytes Relative: 4 %
Lymphs Abs: 0.5 10*3/uL — ABNORMAL LOW (ref 0.7–4.0)
MCH: 25.5 pg — ABNORMAL LOW (ref 26.0–34.0)
MCHC: 31.4 g/dL (ref 30.0–36.0)
MCV: 81.3 fL (ref 80.0–100.0)
Monocytes Absolute: 0.7 10*3/uL (ref 0.1–1.0)
Monocytes Relative: 4 %
Neutro Abs: 12.9 10*3/uL — ABNORMAL HIGH (ref 1.7–7.7)
Neutrophils Relative %: 89 %
Platelets: 223 10*3/uL (ref 150–400)
RBC: 4 MIL/uL — ABNORMAL LOW (ref 4.22–5.81)
RDW: 18.1 % — ABNORMAL HIGH (ref 11.5–15.5)
WBC: 14.6 10*3/uL — ABNORMAL HIGH (ref 4.0–10.5)
nRBC: 0.2 % (ref 0.0–0.2)

## 2023-04-03 LAB — APTT
aPTT: 100 s — ABNORMAL HIGH (ref 24–36)
aPTT: 61 s — ABNORMAL HIGH (ref 24–36)
aPTT: 83 s — ABNORMAL HIGH (ref 24–36)

## 2023-04-03 LAB — GLUCOSE, CAPILLARY
Glucose-Capillary: 176 mg/dL — ABNORMAL HIGH (ref 70–99)
Glucose-Capillary: 231 mg/dL — ABNORMAL HIGH (ref 70–99)
Glucose-Capillary: 131 mg/dL — ABNORMAL HIGH (ref 70–99)
Glucose-Capillary: 147 mg/dL — ABNORMAL HIGH (ref 70–99)

## 2023-04-03 LAB — BASIC METABOLIC PANEL
Anion gap: 11 (ref 5–15)
BUN: 15 mg/dL (ref 8–23)
CO2: 19 mmol/L — ABNORMAL LOW (ref 22–32)
Calcium: 9.1 mg/dL (ref 8.9–10.3)
Chloride: 111 mmol/L (ref 98–111)
Creatinine, Ser: 0.66 mg/dL (ref 0.61–1.24)
GFR, Estimated: >60 mL/min (ref >60)
Glucose, Bld: 180 mg/dL — ABNORMAL HIGH (ref 70–99)
Potassium: 4.3 mmol/L (ref 3.5–5.1)
Sodium: 141 mmol/L (ref 135–145)

## 2023-04-03 LAB — HEPARIN LEVEL (UNFRACTIONATED): Heparin Unfractionated: 0.82 [IU]/mL — ABNORMAL HIGH (ref 0.30–0.70)

## 2023-04-03 MED ORDER — INSULIN ASPART 100 UNIT/ML IJ SOLN
0.0000 [IU] | Freq: Three times a day (TID) | INTRAMUSCULAR | Status: DC
  Administered 2023-04-03: 5 [IU] via SUBCUTANEOUS
  Administered 2023-04-04: 3 [IU] via SUBCUTANEOUS
  Administered 2023-04-04 – 2023-04-05 (×2): 8 [IU] via SUBCUTANEOUS
  Filled 2023-04-03 (×5): qty 1

## 2023-04-03 MED ORDER — HEPARIN BOLUS VIA INFUSION
1000.0000 [IU] | Freq: Once | INTRAVENOUS | Status: AC
  Administered 2023-04-03: 1000 [IU] via INTRAVENOUS
  Filled 2023-04-03: qty 1000

## 2023-04-03 MED ORDER — HYDROMORPHONE HCL 2 MG PO TABS
2.0000 mg | ORAL_TABLET | ORAL | Status: DC | PRN
  Administered 2023-04-03 – 2023-04-04 (×3): 2 mg via ORAL
  Administered 2023-04-04 – 2023-04-05 (×2): 4 mg via ORAL
  Administered 2023-04-05: 2 mg via ORAL
  Filled 2023-04-03: qty 1
  Filled 2023-04-03: qty 2
  Filled 2023-04-03 (×5): qty 1

## 2023-04-03 MED ORDER — SODIUM CHLORIDE 0.9 % IV BOLUS
500.0000 mL | Freq: Once | INTRAVENOUS | Status: AC
  Administered 2023-04-03: 500 mL via INTRAVENOUS

## 2023-04-03 MED ORDER — IPRATROPIUM-ALBUTEROL 0.5-2.5 (3) MG/3ML IN SOLN
3.0000 mL | Freq: Two times a day (BID) | RESPIRATORY_TRACT | Status: DC
  Administered 2023-04-03 – 2023-04-04 (×3): 3 mL via RESPIRATORY_TRACT
  Filled 2023-04-03 (×4): qty 3

## 2023-04-03 MED ORDER — SENNOSIDES-DOCUSATE SODIUM 8.6-50 MG PO TABS
1.0000 | ORAL_TABLET | Freq: Two times a day (BID) | ORAL | Status: DC
  Administered 2023-04-03 – 2023-04-04 (×2): 1 via ORAL
  Filled 2023-04-03 (×2): qty 1

## 2023-04-03 NOTE — Progress Notes (Signed)
       CROSS COVER NOTE  NAME: Tony Benson MRN: [id number] DOB : [birth year]    Concern as stated by nurse / staff   Message received from RN via secure chat Pt was admitted to floor today for dyspnea - large pericardial effusion. On heparin drip. Pancreatic mass w/liver mets. He is diabetic on jardiance at home. There are no orders to do blood sugar checks or sliding scale.      Pertinent findings on chart review:   Assessment and  Interventions   Assessment: Hgb A1C 10.3 on 02/28/2023 Remains on daily jardiance 2 blood glucose readings above 180  Plan: SSI ACHS - moderate dose scale       Donnie Mesa NP [facility] Cross Cover 7pm-7am - check amion for availability Pager [phone]

## 2023-04-03 NOTE — Consult Note (Signed)
WOC Nurse Consult Note: Reason for Consult: coccyx and penis  Patient admitted for cough, metastatic pancreatic cancer on palliative chemo Wound type: Stage 2 coccyx; 3cmx 2cm x no depth documented, pale and pink, no drainage Penis; listed as abrasion, no measurements recorded  Pressure Injury POA: Yes Measurement:see above  Wound ZOX:WRUE, pink Drainage (amount, consistency, odor) none  Periwound: intact  Dressing procedure/placement/frequency: Cleanse coccyx wound with saline, pat dry. Cover with single layer of xeroform gauze, top with foam per the nursing skin care order set.  Penile wounds are difficult to manage other than covering with single layer of xeroform as well, ok to cover area with ABD pad and hold in place with mesh underwear.    Re consult if needed, will not follow at this time. Thanks  Whitney Bingaman [employer], RN,CWOCN, CNS, CWON-AP [phone])

## 2023-04-03 NOTE — Consult Note (Addendum)
Palliative Medicine [facility] at [facility] Telephone:([phone]) [phone] [phone]) [phone]   Name: Tony Benson Date: 04/03/2023 MRN: [id number]  DOB: [birth year]  Patient Care Team: Sherrie Mustache, MD as PCP - General (Internal Medicine) End, Cristal Deer, MD as PCP - Cardiology (Cardiology) Luretha Murphy, MD as Consulting Physician (General Surgery) Earna Coder, MD as Consulting Physician (Oncology) Benita Gutter, RN as Oncology Nurse Navigator    REASON FOR CONSULTATION: Tony Benson is a 62 y.o. male with multiple medical problems including OSA on CPAP, diabetes.  Patient was hospitalized November 2024 with shortness of breath/acute PE, bilateral DVT.  CT of the abdomen and pelvis found to have a pancreatic head mass and liver masses.  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  Patient is status post 2 cycles of gemcitabine and Abraxane.  Now admitted to the hospital with pneumonia.  Palliative care was consulted to address goals and manage ongoing symptoms.   SOCIAL HISTORY:     reports that he has never smoked. He has never used smokeless tobacco. He reports that he does not drink alcohol and does not use drugs.  Patient is married lives at home with his wife.  They have a daughter in [place] who is a Engineer, civil (consulting).  Patient worked in [employer] fund[employer].  ADVANCE DIRECTIVES:  Not on file  CODE STATUS: Full code  PAST MEDICAL HISTORY: Past Medical History:  Diagnosis Date   Asthma    Cancer (HCC)    prostate   Diabetes mellitus without complication (HCC)    type II   Hyperlipidemia    Hypertension    Sleep apnea    uses Cpap machine     PAST SURGICAL HISTORY:  Past Surgical History:  Procedure Laterality Date   CHOLECYSTECTOMY     COLONOSCOPY WITH PROPOFOL N/A 11/18/2018   Procedure: COLONOSCOPY WITH PROPOFOL;  Surgeon: Pasty Spillers, MD;   Location: [facility] ENDOSCOPY;  Service: Gastroenterology;  Laterality: N/A;   COLONOSCOPY WITH PROPOFOL N/A 02/05/2023   Procedure: COLONOSCOPY WITH PROPOFOL;  Surgeon: Toney Reil, MD;  Location: [facility] ENDOSCOPY;  Service: Gastroenterology;  Laterality: N/A;   IR IMAGING GUIDED PORT INSERTION  03/24/2023   IR US LIVER BIOPSY  03/03/2023   LAPAROSCOPIC RETROPUBIC PROSTATECTOMY     2023   LUNG SURGERY     "ligation of thoracic duct"   PROSTATE BIOPSY N/A 09/21/2020   Procedure: PROSTATE BIOPSY [name];  Surgeon: [name], MD;  Location: [facility] ORS;  Service: Urology;  Laterality: N/A;    HEMATOLOGY/ONCOLOGY HISTORY:  Oncology History Overview Note   # NOV 2024- Liver, needle/core biopsy,  :      - METASTATIC CARCINOMA      - SEE NOTE       Diagnosis Note : The patient's clinical history of prostate cancer, recent      pleural effusion and MRI findings significant for a large head of pancreas mass      and multiple liver masses are noted.  Immunohistochemistry is performed on block      1B.  The tumor cells are diffusely positive for CK7 while negative for CK20,      CDX-2, TTF-1, and NKX3.1 (prostate marker).  The immunohistochemical staining      pattern is not specific and may be seen in cancers of the upper GI tract,      pancreaticobiliary system, lung (less likely due to TTF-1 negativity) and  breast.  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  Clinical and radiological correlation recommended.  Dr. [place]      reviewed the case and agrees with the above diagnosis.   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  MEDICATIONS:  Current Facility-Administered Medications  Medication Dose Route Frequency Provider Last Rate Last Admin   acetaminophen (TYLENOL) tablet 650 mg  650 mg Oral Q6H PRN Mansy, Jan A, MD       Or   acetaminophen (TYLENOL) suppository 650 mg  650 mg Rectal Q6H PRN Mansy, Vernetta Honey, MD       ALPRAZolam Prudy Feeler) tablet 0.5 mg  0.5 mg Oral BID PRN Mansy, Jan A, MD       amLODipine (NORVASC) tablet 10 mg  10 mg Oral Daily Mansy, Jan A, MD       atorvastatin (LIPITOR) tablet 10 mg  10 mg Oral Daily Mansy, Jan A, MD   10 mg at 04/03/23 1031   azithromycin (ZITHROMAX) 500 mg in sodium chloride 0.9 % 250 mL IVPB  500 mg Intravenous Q24H Mansy, Jan A, MD 250 mL/hr at 04/03/23 0554 Infusion Verify at 04/03/23 0554   cefTRIAXone (ROCEPHIN) 2 g in sodium chloride 0.9 % 100 mL IVPB  2 g Intravenous Q24H Mansy, Jan A, MD   Stopped at 04/03/23 0533   Chlorhexidine Gluconate Cloth 2 % PADS 6 each  6 each Topical Daily Loyce Dys, MD   6 each at 04/03/23 1212   chlorpheniramine-HYDROcodone (TUSSIONEX) 10-8 MG/5ML suspension 5 mL  5 mL Oral Q12H PRN Mansy, Jan A, MD       empagliflozin (JARDIANCE) tablet 25 mg  25 mg Oral Daily Mansy, Jan A, MD   25 mg at 04/03/23 1033   feeding supplement (ENSURE ENLIVE / ENSURE PLUS) liquid 237 mL  237 mL Oral BID BM Mansy, Jan A, MD   237 mL at 04/03/23 1032   fentaNYL (DURAGESIC) 12 MCG/HR 1 patch  1 patch Transdermal Q72H Mansy, Jan A, MD   1 patch at 04/02/23 1110   guaiFENesin (ROBITUSSIN) 100 MG/5ML liquid 15 mL  15 mL Oral Q6H Jawo, Modou L, NP   15 mL at 04/03/23 1029   heparin ADULT infusion 100 units/mL (25000 units/237mL)  1,250 Units/hr Intravenous Continuous Rosezetta Schlatter T, MD 12.5 mL/hr at 04/03/23 0554 1,250 Units/hr at 04/03/23 0554   HYDROmorphone (DILAUDID) tablet 2-4 mg  2-4 mg Oral Q4H PRN Rosezetta Schlatter T, MD       insulin aspart (novoLOG) injection 0-15 Units  0-15 Units Subcutaneous TID AC & HS Manuela Schwartz, NP   5 Units at 04/03/23 1320   ipratropium-albuterol (DUONEB) 0.5-2.5 (3) MG/3ML nebulizer solution 3 mL  3 mL Nebulization BID Rosezetta Schlatter T, MD       magnesium hydroxide (MILK OF  MAGNESIA) suspension 30 mL  30 mL Oral Daily PRN Mansy, Jan A, MD       metoprolol tartrate (LOPRESSOR) tablet 50 mg  50 mg Oral BID Mansy, Jan A, MD   50 mg at 04/03/23 1031   naloxone [facility]) nasal spray 4 mg/0.1 mL  1 spray Nasal PRN Mansy, Jan A, MD       OLANZapine (ZYPREXA) tablet 5 mg  5 mg Oral QHS Mansy, Jan A, MD   5 mg at 04/02/23 2108   omega-3 acid ethyl esters (LOVAZA) capsule 1 g  1 g Oral Daily Mansy, Jan A, MD   1 g at 04/03/23 1031   ondansetron (ZOFRAN) tablet 4 mg  4 mg Oral Q6H PRN Mansy, Jan A, MD       Or   ondansetron [facility]) injection 4 mg  4 mg Intravenous Q6H PRN Mansy, Jan A, MD       traZODone (DESYREL) tablet 25 mg  25 mg Oral QHS PRN Mansy, Jan A, MD        VITAL SIGNS: BP 102/63 (BP Location: Right Arm)   Pulse (!) 110   Temp 97.9 F (36.6 C)   Resp 17   Ht 5\' 10"  (1.778 m)   Wt 149 lb 0.5 oz (67.6 kg)   SpO2 98%   BMI 21.38 kg/m  Filed Weights   03/31/23 1910  Weight: 149 lb 0.5 oz (67.6 kg)    Estimated body mass index is 21.38 kg/m as calculated from the following:   Height as of this encounter: 5\' 10"  (1.778 m).   Weight as of this encounter: 149 lb 0.5 oz (67.6 kg).  LABS: CBC:    Component Value Date/Time   WBC 14.6 (H) 04/03/2023 0534   HGB 10.2 (L) 04/03/2023 0534   HGB 8.4 (L) 03/26/2023 0813   HCT 32.5 (L) 04/03/2023 0534   PLT 223 04/03/2023 0534   PLT 567 (H) 03/26/2023 0813   MCV 81.3 04/03/2023 0534   NEUTROABS 12.9 (H) 04/03/2023 0534   LYMPHSABS 0.5 (L) 04/03/2023 0534   MONOABS 0.7  04/03/2023 0534   EOSABS 0.3 04/03/2023 0534   BASOSABS 0.0 04/03/2023 0534   Comprehensive Metabolic Panel:    Component Value Date/Time   NA 141 04/03/2023 0534   K 4.3 04/03/2023 0534   CL 111 04/03/2023 0534   CO2 19 (L) 04/03/2023 0534   BUN 15 04/03/2023 0534   CREATININE 0.66 04/03/2023 0534   CREATININE 0.52 (L) 03/26/2023 0813   GLUCOSE 180 (H) 04/03/2023 0534   CALCIUM 9.1 04/03/2023 0534   AST 23 03/31/2023 2200   AST 22 03/26/2023 0813   ALT 28 03/31/2023 2200   ALT 19 03/26/2023 0813   ALKPHOS 670 (H) 03/31/2023 2200   BILITOT 1.2 (H) 03/31/2023 2200   BILITOT 0.7 03/26/2023 0813   PROT 6.5 03/31/2023 2200   ALBUMIN 2.1 (L) 03/31/2023 2200    RADIOGRAPHIC STUDIES: US Venous Img Lower Bilateral (DVT) Result Date: 04/02/2023 CLINICAL DATA:  BILATERAL lower extremity pain times months. [id number] Lower extremity pain [id number] EXAM: BILATERAL LOWER EXTREMITY VENOUS DOPPLER ULTRASOUND TECHNIQUE: Gray-scale sonography with graded compression, as well as color Doppler and duplex ultrasound were performed to evaluate the lower extremity deep venous systems from the level of the common femoral vein and including the common femoral, femoral, profunda femoral, popliteal and calf veins including the posterior tibial, peroneal and gastrocnemius veins when visible. The superficial great saphenous vein was also interrogated. Spectral Doppler was utilized  to evaluate flow at rest and with distal augmentation maneuvers in the common femoral, femoral and popliteal veins. COMPARISON:  Lower extremity venous duplex, most recently 03/25/2023, POSITIVE for BILATERAL lower extremity DVT of the RIGHT femoral and LEFT popliteal veins. FINDINGS: RIGHT LOWER EXTREMITY VENOUS Normal compressibility of the RIGHT common femoral and visualized portions of profunda femoral vein and great saphenous veins. Similar appearance of near-occlusive filling defect involving the RIGHT femoral, popliteal and visualized  portions of the calf veins OTHER No evidence of superficial thrombophlebitis or abnormal fluid collection. Limitations: none LEFT LOWER EXTREMITY VENOUS Normal compressibility of the LEFT common femoral, superficial femoral, profunda femoral vein and great saphenous veins. Similar appearance of near-occlusive filling defect within the LEFT popliteal and visualized portions of the calf veins. OTHER No evidence of superficial thrombophlebitis or abnormal fluid collection. Limitations: none IMPRESSION: Since lower extremity venous duplex dated 03/25/2023; 1. No new or worsening DVT within either lower extremity. 2. Similar appearance of BILATERAL lower extremity DVT, extending from the RIGHT femoral through the calf veins and LEFT popliteal through the calf veins Roanna Banning, MD [facility] Radiology Electronically Signed   By: Roanna Banning M.D.   On: 04/02/2023 13:38   ECHOCARDIOGRAM COMPLETE Result Date: 04/01/2023    ECHOCARDIOGRAM REPORT   Patient Name:   Tony Benson Date of Exam: 04/01/2023 Medical Rec #:  [id number]     Height:       70.0 in Accession #:    [id number]    Weight:       149.0 lb Date of Birth:  [birth year]    BSA:          1.842 m Patient Age:    62 years      BP:           109/52 mmHg Patient Gender: M             HR:           100 bpm. Exam Location:  [facility] Procedure: 2D Echo, Cardiac Doppler and Color Doppler Indications:     Pericardial Effusion I31.3  History:         Patient has prior history of Echocardiogram examinations, most                  recent 03/04/2023. Risk Factors:Diabetes, Hypertension and                  Sleep Apnea.  Sonographer:     Cristela Blue Referring Phys:  [id number] JAN A MANSY Diagnosing Phys: Yvonne Kendall MD IMPRESSIONS  1. Left ventricular ejection fraction, by estimation, is 60 to 65%. The left ventricle has normal function. The left ventricle has no regional wall motion abnormalities. There is moderate left  ventricular hypertrophy. Left ventricular diastolic parameters are consistent with Grade II diastolic dysfunction (pseudonormalization).  2. Right ventricular systolic function is normal. The right ventricular size is normal. There is mildly elevated pulmonary artery systolic pressure.  3. There is equivocal early diastolic collapse of the right ventricle. However, mitral and tricuspid valve inflow velocity respiratory variation is not consistent with tamponade physiology.. Large pericardial effusion. The pericardial effusion is circumferential.  4. The mitral valve is normal in structure. Trivial mitral valve regurgitation. No evidence of mitral stenosis.  5. The aortic valve is tricuspid. Aortic valve regurgitation is not visualized. No aortic stenosis is present.  6. The inferior vena cava is normal in size with <50% respiratory variability, suggesting right  atrial pressure of 8 mmHg. Comparison(s): A prior study was performed on 03/04/2023. The pericardial effusion appears somewhat larger. FINDINGS  Left Ventricle: Left ventricular ejection fraction, by estimation, is 60 to 65%. The left ventricle has normal function. The left ventricle has no regional wall motion abnormalities. The left ventricular internal cavity size was normal in size. There is  moderate left ventricular hypertrophy. Left ventricular diastolic parameters are consistent with Grade II diastolic dysfunction (pseudonormalization). Right Ventricle: The right ventricular size is normal. No increase in right ventricular wall thickness. Right ventricular systolic function is normal. There is mildly elevated pulmonary artery systolic pressure. The tricuspid regurgitant velocity is 3.00  m/s, and with an assumed right atrial pressure of 8 mmHg, the estimated right ventricular systolic pressure is 44.0 mmHg. Left Atrium: Left atrial size was normal in size. Right Atrium: Right atrial size was normal in size. Pericardium: There is equivocal early  diastolic collapse of the right ventricle. However, mitral and tricuspid valve inflow velocity respiratory variation is not consistent with tamponade physiology. A large pericardial effusion is present. The pericardial effusion is circumferential. Mitral Valve: The mitral valve is normal in structure. Trivial mitral valve regurgitation. No evidence of mitral valve stenosis. MV peak gradient, 4.8 mmHg. The mean mitral valve gradient is 3.0 mmHg. Tricuspid Valve: The tricuspid valve is normal in structure. Tricuspid valve regurgitation is mild. Aortic Valve: The aortic valve is tricuspid. Aortic valve regurgitation is not visualized. No aortic stenosis is present. Aortic valve mean gradient measures 4.0 mmHg. Aortic valve peak gradient measures 6.0 mmHg. Aortic valve area, by VTI measures 2.91 cm. Pulmonic Valve: The pulmonic valve was grossly normal. Pulmonic valve regurgitation is trivial. No evidence of pulmonic stenosis. Aorta: The aortic root is normal in size and structure. Pulmonary Artery: The pulmonary artery is not well seen. Venous: The inferior vena cava is normal in size with less than 50% respiratory variability, suggesting right atrial pressure of 8 mmHg. IAS/Shunts: No atrial level shunt detected by color flow Doppler.  LEFT VENTRICLE PLAX 2D LVIDd:         4.10 cm   Diastology LVIDs:         1.80 cm   LV e' medial:    6.42 cm/s LV PW:         1.50 cm   LV E/e' medial:  13.8 LV IVS:        1.20 cm   LV e' lateral:   15.00 cm/s LVOT diam:     2.10 cm   LV E/e' lateral: 5.9 LV SV:         58 LV SV Index:   32 LVOT Area:     3.46 cm  RIGHT VENTRICLE RV Basal diam:  2.90 cm RV Mid diam:    2.50 cm RV S prime:     20.90 cm/s TAPSE (M-mode): 1.8 cm LEFT ATRIUM           Index        RIGHT ATRIUM          Index LA diam:      2.40 cm 1.30 cm/m   RA Area:     9.46 cm LA Vol (A4C): 20.3 ml 11.02 ml/m  RA Volume:   14.30 ml 7.76 ml/m  AORTIC VALVE AV Area (Vmax):    2.70 cm AV Area (Vmean):   2.44 cm AV  Area (VTI):     2.91 cm AV Vmax:  122.00 cm/s AV Vmean:          90.900 cm/s AV VTI:            0.200 m AV Peak Grad:      6.0 mmHg AV Mean Grad:      4.0 mmHg LVOT Vmax:         95.00 cm/s LVOT Vmean:        64.000 cm/s LVOT VTI:          0.168 m LVOT/AV VTI ratio: 0.84  AORTA Ao Root diam: 3.05 cm MITRAL VALVE               TRICUSPID VALVE MV Area (PHT): 4.57 cm    TR Peak grad:   36.0 mmHg MV Area VTI:   2.32 cm    TR Vmax:        300.00 cm/s MV Peak grad:  4.8 mmHg MV Mean grad:  3.0 mmHg    SHUNTS MV Vmax:       1.09 m/s    Systemic VTI:  0.17 m MV Vmean:      80.1 cm/s   Systemic Diam: 2.10 cm MV Decel Time: 166 msec MV E velocity: 88.60 cm/s MV A velocity: 78.10 cm/s MV E/A ratio:  1.13 Yvonne Kendall MD Electronically signed by Yvonne Kendall MD Signature Date/Time: 04/01/2023/2:19:37 PM    Final    CT Angio Chest PE W/Cm &/Or Wo Cm Result Date: 04/01/2023 CLINICAL DATA:  Cough and fever for 3 days. On chemo for pancreatic cancer. Elevated heart rate. PE suspected. EXAM: CT ANGIOGRAPHY CHEST WITH CONTRAST TECHNIQUE: Multidetector CT imaging of the chest was performed using the standard protocol during bolus administration of intravenous contrast. Multiplanar CT image reconstructions and MIPs were obtained to evaluate the vascular anatomy. RADIATION DOSE REDUCTION: This exam was performed according to the departmental dose-optimization program which includes automated exposure control, adjustment of the mA and/or kV according to patient size and/or use of iterative reconstruction technique. CONTRAST:  75mL OMNIPAQUE IOHEXOL 350 MG/ML SOLN COMPARISON:  Same day chest radiograph and CTA chest 04/28/2022 FINDINGS: Cardiovascular: Right IJ CVC tip at the superior cavoatrial junction. Negative for acute pulmonary embolism. Large pericardial effusion increased compared to 02/27/2023. Clinical correlation recommended to exclude tamponade physiology. Mild aortic atherosclerotic calcification.  Mediastinum/Nodes: Debris layering within the posterior trachea. Esophagus is unremarkable. Surgical clips adjacent to the lower esophagus. Mediastinal and hilar adenopathy is not well evaluated. Lungs/Pleura: Diffuse bronchial wall thickening scattered mucous plugging greatest in the right lower lobe. Bilateral centrilobular micro nodules and tree-in-bud opacities with more confluence opacities in the right lung. Irregular pleural thickening or loculated pleural effusion in the lower right chest has increased compared to 02/27/2023. There is associated pleural nodularity in the posterior lower lobe. For example 14 x 9 mm nodule on series 5/image 97. Pleural calcifications in the posterior right lung base. No pneumothorax. Upper Abdomen: Incompletely evaluated large metastasis in the right hepatic lobe appears slightly increased compared to 02/27/2023, measuring up to 14.5 cm previously 13.3 cm. Partially visualized dilated main pancreatic duct. Pancreatic head is not included in the exam. Small amount of perisplenic ascites. Musculoskeletal: No acute fracture or destructive osseous lesion. Review of the MIP images confirms the above findings. IMPRESSION: 1. Negative for acute pulmonary embolism. 2. Large pericardial effusion increased compared to 02/27/2023. Clinical correlation recommended to exclude tamponade physiology. 3. Increased bilateral centrilobular micro nodules and tree-in-bud opacities with more confluence opacities in the right lung, likely due to  bronchopneumonia from aspiration. Lymphangitic spread of tumor could appear similarly. 4. Irregular pleural thickening or loculated pleural effusion in the lower right chest has increased compared to 02/27/2023. There is associated pleural nodularity in the posterior lower lobe. This may be due to bronchopneumonia though metastases are not excluded. 5. 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 Aortic Atherosclerosis (ICD10-I70.0). Electronically Signed    By: Minerva Fester M.D.   On: 04/01/2023 00:53   DG Chest 2 View Result Date: 03/31/2023 CLINICAL DATA:  Cough and fever EXAM: CHEST - 2 VIEW COMPARISON:  Chest x-ray 03/05/2023.  Chest CT 02/27/2023. FINDINGS: There is a new right chest port with distal catheter tip projecting over the distal SVC. The cardiac silhouette appears mildly enlarged, unchanged. Small right pleural effusion persists. Patchy opacities in the right lung base have decreased, but have not completely resolved. There is a new focal nodular density in the left mid lung measuring 8 mm. The left lung is otherwise clear. There is no pneumothorax or acute fracture. IMPRESSION: 1. Small right pleural effusion persists. 2. Patchy opacities in the right lung base have decreased, but have not completely resolved. 3. New 8 mm nodular density in the left mid lung. Recommend follow-up chest CT. Electronically Signed   By: Darliss Cheney M.D.   On: 03/31/2023 21:10   NM Bone Scan Whole Body Result Date: 03/30/2023 CLINICAL DATA:  Metastatic pancreatic cancer. Evaluate for bone disease. EXAM: NUCLEAR MEDICINE WHOLE BODY BONE SCAN TECHNIQUE: Whole body anterior and posterior images were obtained approximately 3 hours after intravenous injection of radiopharmaceutical. RADIOPHARMACEUTICALS:  21.78 mCi Technetium-69m MDP IV COMPARISON:  CTA chest 02/27/2023 and abdominal MRI 02/28/2023 FINDINGS: No abnormal uptake involving the axial or appendicular skeleton. Expected uptake in the renal collecting systems and urinary bladder. IMPRESSION: Negative bone scan.  No evidence for metastatic bone disease. Electronically Signed   By: Richarda Overlie M.D.   On: 03/30/2023 21:01   US Venous Img Lower Bilateral Result Date: 03/25/2023 CLINICAL DATA:  Follow-up lower extremity DVT. History of prostate cancer. Patient is on anticoagulation. EXAM: BILATERAL LOWER EXTREMITY VENOUS DOPPLER ULTRASOUND TECHNIQUE: Gray-scale sonography with graded compression, as well  as color Doppler and duplex ultrasound were performed to evaluate the lower extremity deep venous systems from the level of the common femoral vein and including the common femoral, femoral, profunda femoral, popliteal and calf veins including the posterior tibial, peroneal and gastrocnemius veins when visible. The superficial great saphenous vein was also interrogated. Spectral Doppler was utilized to evaluate flow at rest and with distal augmentation maneuvers in the common femoral, femoral and popliteal veins. COMPARISON:  Bilateral lower extremity venous Doppler ultrasound-02/26/2023 (positive for DVT extending from right femoral vein through right tibial veins as well as the left popliteal vein through the left tibial veins) FINDINGS: RIGHT LOWER EXTREMITY Common Femoral Vein: No evidence of thrombus. Normal compressibility, respiratory phasicity and response to augmentation. Saphenofemoral Junction: No evidence of thrombus. Normal compressibility and flow on color Doppler imaging. Profunda Femoral Vein: No evidence of thrombus. Normal compressibility and flow on color Doppler imaging. Femoral Vein: There is predominantly occlusive DVT involving the proximal (image 41), mid (image 44) and distal (image 47) aspects of the right femoral vein, grossly unchanged compared to the 02/26/2023 examination. Popliteal Vein: Redemonstrated near occlusive DVT involving the right popliteal vein (images 47 and 50), similar to the 02/26/2023 examination. Calf Veins: Redemonstrated occlusive DVT involving both paired divisions of the left posterior tibial (image 56) as well as both  paired divisions of the right peroneal veins (image 58). Superficial Great Saphenous Vein: No evidence of thrombus. Normal compressibility. Other Findings:  None. _________________________________________________________ LEFT LOWER EXTREMITY Common Femoral Vein: No evidence of thrombus. Normal compressibility, respiratory phasicity and response to  augmentation. Saphenofemoral Junction: No evidence of thrombus. Normal compressibility and flow on color Doppler imaging. Profunda Femoral Vein: No evidence of thrombus. Normal compressibility and flow on color Doppler imaging. Femoral Vein: No evidence of thrombus. Normal compressibility, respiratory phasicity and response to augmentation. Popliteal Vein: Redemonstrated occlusive DVT involving the left popliteal vein (images 21 and 25), unchanged compared to the 02/26/2023 examination. Calf Veins: Redemonstrated occlusive DVT involving both paired divisions of the left posterior tibial vein (images 27 and 28), unchanged compared to the 02/26/2023 examination. The left peroneal veins appear patent where imaged (image 30), improved compared to the 02/26/2023 examination. Superficial Great Saphenous Vein: No evidence of thrombus. Normal compressibility. Other Findings:  None. IMPRESSION: 1. Redemonstrated bilateral lower extremity DVT extending from the right femoral vein through the right calf veins, grossly unchanged compared to the 02/26/2023 examination. 2. Resolved left peroneal DVT. 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 Electronically Signed   By: Simonne Come M.D.   On: 03/25/2023 12:10   IR IMAGING GUIDED PORT INSERTION Result Date: 03/24/2023 CLINICAL DATA:  Metastatic pancreas CANCER TO THE LIVER EXAM: RIGHT INTERNAL JUGULAR SINGLE LUMEN POWER PORT CATHETER INSERTION Date:  03/24/2023 03/24/2023 11:55 am Radiologist:  M. Ruel Favors, MD Guidance:  Ultrasound and fluoroscopic MEDICATIONS: 1% lidocaine local with epinephrine ANESTHESIA/SEDATION: Versed 0.5 mg IV; Fentanyl 25 mcg IV; Moderate Sedation Time:  30 minutes The patient was continuously monitored during the procedure by the interventional radiology nurse under my direct supervision. FLUOROSCOPY: 0 minutes, 71 seconds  (3.4 mGy) COMPLICATIONS: None immediate. CONTRAST:  None. PROCEDURE: Informed consent was obtained from the patient following explanation of the procedure, risks, benefits and alternatives. The patient understands, agrees and consents for the procedure. All questions were addressed. A time out was performed. Maximal barrier sterile technique utilized including caps, mask, sterile gowns, sterile gloves, large sterile drape, hand hygiene, and 2% chlorhexidine scrub. Under sterile conditions and local anesthesia, right internal jugular micropuncture venous access was performed. Access was performed with ultrasound. Images were obtained for documentation of the patent right internal jugular vein. A guide wire was inserted followed by a transitional dilator. This allowed insertion of a guide wire and catheter into the IVC. Measurements were obtained from the SVC / RA junction back to the right IJ venotomy site. In the right infraclavicular chest, a subcutaneous pocket was created over the second anterior rib. This was done under sterile conditions and local anesthesia. 1% lidocaine with epinephrine was utilized for this. A 2.5 cm incision was made in the skin. Blunt dissection was performed to create a subcutaneous pocket over the right pectoralis major muscle. The pocket was flushed with saline vigorously. There was adequate hemostasis. The port catheter was assembled and checked for leakage. The port catheter was secured in the pocket with two retention sutures. The tubing was tunneled subcutaneously to the right venotomy site and inserted into the SVC/RA junction through a valved peel-away sheath. Position was confirmed with fluoroscopy. Images were obtained for documentation. The patient tolerated the procedure well. No immediate complications. Incisions were closed in a two layer fashion with 4 - 0 Vicryl suture. Dermabond was applied to the skin.  The port catheter was accessed, blood was aspirated followed by  saline and heparin flushes. Needle was removed. A dry sterile dressing was applied. IMPRESSION: Ultrasound and fluoroscopically guided right internal jugular single lumen power port catheter insertion. Tip in the SVC/RA junction. Catheter ready for use. Electronically Signed   By: Judie Petit.  Shick M.D.   On: 03/24/2023 12:17   DG Chest Port 1 View Result Date: 03/05/2023 CLINICAL DATA:  Shortness of breath.  Follow-up exam. EXAM: PORTABLE CHEST 1 VIEW COMPARISON:  Chest radiograph dated 02/27/2023. FINDINGS: Small right pleural effusion. Right lung base opacity as well as faint clusters of ground-glass density involving the right mid to lower lung field consistent with pneumonia. Overall interval worsening of pulmonary infiltrate since the radiograph of 02/27/2023. The left lung is clear. No pneumothorax. Stable cardiac silhouette. No acute osseous pathology. IMPRESSION: Interval worsening of right lung pneumonia. Electronically Signed   By: Elgie Collard M.D.   On: 03/05/2023 23:52    PERFORMANCE STATUS (ECOG) : 3 - Symptomatic, >50% confined to bed  Review of Systems Unless otherwise noted, a complete review of systems is negative.  Physical Exam General: NAD Cardiovascular: regular rate and rhythm Pulmonary: clear ant fields Abdomen: soft, nontender, + bowel sounds GU: no suprapubic tenderness Extremities: no edema, no joint deformities Skin: no rashes Neurological: Weakness but otherwise nonfocal  IMPRESSION: Patient with stage IV pancreatic cancer.  He received cycle 2 gemcitabine Abraxane chemotherapy on 03/26/2023.  Now admitted 04/01/2023 with community-acquired pneumonia.    CTA of the chest on 03/31/2023 was negative for PE.  Shows increased large pericardial effusion, increased centrilobular nodules and tree-in-bud opacities likely secondary to aspiration pneumonia versus lymphangitic spread of the cancer, increased right pleural effusion, and decreased size of hepatic  metastasis.  I met with patient and daughter.  Patient's wife participated in the conversation via phone. He does have some early indication of possible treatment effect with decreased size of hepatic metastasis.  However, overall, he remains frail with poor performance status and minimal oral intake.  Family understand that patient's cancer is aggressive and his prognosis is poor.  Patient states clearly that his goal is to continue pursuing cancer treatment and that he is not ready to transition to less aggressive care.  I readdressed CODE STATUS and strongly encouraged patient to consider DNR/DNI.  I explained the almost certain futility associated with resuscitative efforts in the setting of an advanced stage malignancy.  However, patient states that he wants to remain a full code for now.  PLAN: -Continue current scope of treatment -Full code -Will follow  Case and plan discussed with Dr. Donneta Romberg   Time Total: 45 minutes  Visit consisted of counseling and education dealing with the complex and emotionally intense issues of symptom management and palliative care in the setting of serious and potentially life-threatening illness.Greater than 50%  of this time was spent counseling and coordinating care related to the above assessment and plan.  Signed by: Laurette Schimke, PhD, NP-C  Patient interested in continued scope of care.  I personally interviewed and examined the patient. Agreed with the above plan of care. Patient questions were answered. Dr.Allesha Aronoff MD

## 2023-04-03 NOTE — Evaluation (Signed)
Physical Therapy Evaluation Patient Details Name: Tony Benson MRN: [id number] DOB: 07-04-1960 Today's Date: 04/03/2023  History of Present Illness  62 y.o. male with PMHx: metastatic pancreatic cancer on palliative chemotherapy, asthma, prostate cancer, DM2, HTN, dyslipidemia, and PE on Eliquis, who presented with acute onset of dyspnea with associated cough productive of yellow sputum without wheezing. MD assessment: Community-acquired pneumonia  Large pericardial effusion, Pleural effusion and lung metastasis. Hep drip started 12/18 afternoon.   Clinical Impression  Co-treat with OT. Pt received in bed on RA with daughter at bedside and agreed to PT session. Pt reported discomfort present in his right shoulder. Pt performed supine>sit SUP with HOB elevated/bedrails/sit>supine MinA for BLE management, 2xSTS with the use of RW (2wheels) CGA, and took ~4 lateral steps towards [facility] with RW prior to ending session in bed CGAx2 for safety as pt presented extremely lethargic. Pt's O2 desat and HR increased while standing where pt was then instructed to sit down prior to performing another STS. Due to pt's vitals reading: 85-91% SpO2 and 115-126 bpm throughout the session, further mobility was restricted for medical reasons. Towards the end of session, pt's lethargy increased which caused him to have decreased response to following commands. Pt ended session in bed on 2L of O2, 91% SpO2. Pt did not tolerate Tx well due to vitals signs/lethargy from pain medication and will continue to benefit from skilled PT sessions to improve functional mobility and activity tolerance to maximize safety/IND following D/C.        If plan is discharge home, recommend the following: A lot of help with walking and/or transfers;Assist for transportation;Help with stairs or ramp for entrance   Can travel by private vehicle   Yes    Equipment Recommendations BSC/3in1  Recommendations for Other Services        Functional Status Assessment Patient has had a recent decline in their functional status and demonstrates the ability to make significant improvements in function in a reasonable and predictable amount of time.     Precautions / Restrictions Precautions Precautions: Fall Restrictions Weight Bearing Restrictions Per Provider Order: No      Mobility  Bed Mobility Overal bed mobility: Needs Assistance Bed Mobility: Supine to Sit, Sit to Supine     Supine to sit: Supervision, Used rails, HOB elevated Sit to supine: Min assist   General bed mobility comments: MinA to return to bed necessary for BLE management. This was as a result of lethargy from pain medications    Transfers Overall transfer level: Needs assistance Equipment used: Rolling walker (2 wheels) Transfers: Sit to/from Stand Sit to Stand: Contact guard assist           General transfer comment: CGAx1 for STS from EOB for 2 trials and CGA x2 for lateral steps to [facility] d/t increased lethargy for safety    Ambulation/Gait               General Gait Details: deferred  Stairs            Wheelchair Mobility     Tilt Bed    Modified Rankin (Stroke Patients Only)       Balance Overall balance assessment: Needs assistance Sitting-balance support: Feet supported Sitting balance-Leahy Scale: Good     Standing balance support: Bilateral upper extremity supported, During functional activity Standing balance-Leahy Scale: Fair  Pertinent Vitals/Pain Pain Assessment Pain Assessment: Faces Faces Pain Scale: Hurts a little bit Pain Location: R shoulder; BLEs Pain Descriptors / Indicators: Aching Pain Intervention(s): Monitored during session, Premedicated before session    Home Living Family/patient expects to be discharged to:: Private residence (Simultaneous filing. User may not have seen previous data.) Living Arrangements: Spouse/significant other  (Simultaneous filing. User may not have seen previous data.) Available Help at Discharge: Family Type of Home: House Home Access: Level entry       Home Layout: Two level;Able to live on main level with bedroom/bathroom Home Equipment: Shower seat - built [employer] (2 wheels) Additional Comments: believes they have a RW    Prior Function Prior Level of Function : Independent/Modified Independent;Working/employed;Driving (Simultaneous filing. User may not have seen previous data.)             Mobility Comments: Pt reports IND prior to admission ADLs Comments: Pt reports IND prior to admission (Simultaneous filing. User may not have seen previous data.)     Extremity/Trunk Assessment   Upper Extremity Assessment Upper Extremity Assessment: Overall WFL for tasks assessed    Lower Extremity Assessment Lower Extremity Assessment: Generalized weakness       Communication   Communication Communication: No apparent difficulties Cueing Techniques: Verbal cues  Cognition Arousal: Lethargic Behavior During Therapy: Flat affect Overall Cognitive Status: Difficult to assess                                 General Comments: pt had been given pain meds and was groggy/lethargic throughout eval        General Comments General comments (skin integrity, edema, etc.): required replacement of 2L [place] to improve from 85% to 91%; HR ranging from 115-126 throughout session with only standing    Exercises     Assessment/Plan    PT Assessment Patient needs continued PT services  PT Problem List Decreased activity tolerance;Decreased mobility;Pain       PT Treatment Interventions DME instruction;Gait training;Functional mobility training;Therapeutic activities;Therapeutic exercise;Balance training    PT Goals (Current goals can be found in the Care Plan section)  Acute Rehab PT Goals Patient Stated Goal: Goals not stated PT Goal Formulation: With patient Time  For Goal Achievement: 04/17/23 Potential to Achieve Goals: Fair    Frequency Min 1X/week     Co-evaluation PT/OT/SLP Co-Evaluation/Treatment: Yes Reason for Co-Treatment: To address functional/ADL transfers PT goals addressed during session: Mobility/safety with mobility OT goals addressed during session: ADL's and self-care       AM-PAC PT "6 Clicks" Mobility  Outcome Measure Help needed turning from your back to your side while in a flat bed without using bedrails?: A Little Help needed moving from lying on your back to sitting on the side of a flat bed without using bedrails?: A Little Help needed moving to and from a bed to a chair (including a wheelchair)?: A Little Help needed standing up from a chair using your arms (e.g., wheelchair or bedside chair)?: A Little Help needed to walk in hospital room?: A Lot Help needed climbing 3-5 steps with a railing? : Total 6 Click Score: 15    End of Session Equipment Utilized During Treatment: Gait belt Activity Tolerance: Patient limited by lethargy Patient left: in bed;with call bell/phone within reach;with family/visitor present Nurse Communication: Mobility status PT Visit Diagnosis: Unsteadiness on feet (R26.81);Other abnormalities of gait and mobility (R26.89);Muscle weakness (generalized) (M62.81);Difficulty in walking,  not elsewhere classified (R26.2);Pain    Time: 1610-9604 PT Time Calculation (min) (ACUTE ONLY): 27 min   Charges:   PT Evaluation $PT Eval Low Complexity: 1 Low PT Treatments $Therapeutic Activity: 8-22 mins PT General Charges $$ ACUTE PT VISIT: 1 Visit         Makaylen Thieme Sauvignon Howard SPT, LAT, ATC  Iliany Losier Sauvignon-Howard 04/03/2023, 3:29 PM

## 2023-04-03 NOTE — Consult Note (Addendum)
PHARMACY - ANTICOAGULATION CONSULT NOTE  Pharmacy Consult for Heparin infusion Indication: DVT  Allergies  Allergen Reactions   Other Anaphylaxis    peanuts   Morphine And Codeine Itching   Tree Extract Other (See Comments)    ALMONDS  ---- EYE/ FACIAL SWELLING   Wound Dressing Adhesive Rash   Patient Measurements: Height: 5\' 10"  (177.8 cm) Weight: 67.6 kg (149 lb 0.5 oz) IBW/kg (Calculated) : 73 Heparin Dosing Weight: 67.6 kg   Vital Signs: Temp: 97.9 F (36.6 C) (12/19 0732) BP: 102/63 (12/19 0732) Pulse Rate: 110 (12/19 0732)  Labs: Recent Labs    03/31/23 2200 04/01/23 0353 04/01/23 0547 04/02/23 0547 04/02/23 1735 04/03/23 0015 04/03/23 0534 04/03/23 0838  HGB 7.5*  --  6.5* 8.7*  --   --  10.2*  --   HCT 25.3*  --  21.8* 28.0*  --   --  32.5*  --   PLT 201  --  161 170  --   --  223  --   APTT  --   --   --   --  50* 61*  --  100*  LABPROT  --   --   --   --  19.2*  --   --   --   INR  --   --   --   --  1.6*  --   --   --   HEPARINUNFRC  --   --   --   --  0.89* 0.82*  --   --   CREATININE 0.76  --  0.74 0.57*  --   --  0.66  --   TROPONINIHS 19* 38*  --   --   --   --   --   --    Estimated Creatinine Clearance: 91.5 mL/min (by C-G formula based on SCr of 0.66 mg/dL).  Medications:  On PTA apixaban for VTE treatment - last dose taken 12/17 @ 0348   Assessment: Tony Benson is a 62 year old male with history of small nonocclusive segmental/subsegmental pulmonary emboli in the left upper lobe in November 2024 for which he was started on apixaban. Also found to have bilateral lower extremity DVTs extending into the right femoral through the calf veins and left popliteal through the calf veins.  Patient presented with hemoglobin or 6.5 so apixaban was held and patient received blood transfusions. Pharmacy has been consulted for initiation and management of a heparin infusion.   Baseline labs: Hgb 8.7, PLT 170, aPTT, PT/INR, and HL ordered.   Acute DVT in  November, Apixaban 10mg  BID x 1 week completed. Taking apixaban 5mg  po BID PTA. Last dose of apixaban was taken 12/17 @ 0348.  Goal of Therapy:  Heparin level 0.3-0.7 units/ml aPTT 66-102 seconds Monitor platelets by anticoagulation protocol: Yes   Date Time Results Comments  12/18 1810  Heparin infusion started at 1150 un/hr, no bolus  12/19 0015 aPTT=61 HL=0.82 Bolus plus rate change 1250 un/hr  12/19 0838 aPTT=100 Therapeutic x 1        Plan:  aPTT therapeutic with heparin rate of 1250 units/hour. CBC stable with Hgb 10.2 and plt 223. No bleeding identified, anemia like 2ry to chemotherapy. Will continue heparin infusion at current rate and repeat aPTT in 6 hours to confirm results. Monitor with aPTT levels until correlation with HL   Monitor CBC daily while on heparin   [name] PharmD, BCPS 04/03/2023 9:35 AM

## 2023-04-03 NOTE — Progress Notes (Signed)
Rounding Note    Patient Name: Tony Benson Date of Encounter: 04/03/2023  [facility] Cardiologist: Yvonne Kendall, MD   Subjective   Patient is seen on AM rounds, sitting up in chair with daughter in room. Hgb continues to improve, 10.2 today.  Inpatient Medications    Scheduled Meds:  amLODipine  10 mg Oral Daily   atorvastatin  10 mg Oral Daily   Chlorhexidine Gluconate Cloth  6 each Topical Daily   empagliflozin  25 mg Oral Daily   feeding supplement  237 mL Oral BID BM   fentaNYL  1 patch Transdermal Q72H   guaiFENesin  15 mL Oral Q6H   insulin aspart  0-15 Units Subcutaneous TID AC & HS   ipratropium-albuterol  3 mL Nebulization QID   metoprolol tartrate  50 mg Oral BID   OLANZapine  5 mg Oral QHS   omega-3 acid ethyl esters  1 g Oral Daily   Continuous Infusions:  azithromycin 250 mL/hr at 04/03/23 0554   cefTRIAXone (ROCEPHIN)  IV Stopped (04/03/23 0533)   heparin 1,250 Units/hr (04/03/23 0554)   PRN Meds: acetaminophen **OR** acetaminophen, ALPRAZolam, chlorpheniramine-HYDROcodone, HYDROmorphone, magnesium hydroxide, naloxone, ondansetron **OR** ondansetron (ZOFRAN) IV, traZODone   Vital Signs    Vitals:   04/03/23 0156 04/03/23 0419 04/03/23 0727 04/03/23 0732  BP: (!) 110/59 (!) 104/59  102/63  Pulse: 100 (!) 109  (!) 110  Resp: 17 17    Temp: 98.1 F (36.7 C) 98.1 F (36.7 C)  97.9 F (36.6 C)  TempSrc:      SpO2: 99% 94% 96% 98%  Weight:      Height:        Intake/Output Summary (Last 24 hours) at 04/03/2023 1101 Last data filed at 04/03/2023 0554 Gross per 24 hour  Intake 1003.51 ml  Output 550 ml  Net 453.51 ml      03/31/2023    7:10 PM 03/26/2023    8:32 AM 03/24/2023    1:42 PM  Last 3 Weights  Weight (lbs) 149 lb 0.5 oz 149 lb 3.2 oz 150 lb  Weight (kg) 67.6 kg 67.677 kg 68.04 kg      Physical Exam   GEN: Frail appearing, in no acute distress.   Neck: No JVD Cardiac: RRR, no murmurs, rubs, or gallops.   Respiratory: Clear to auscultation bilaterally. GI: Soft, nontender, non-distended  MS: 2+ pitting LE edema bilaterally; No deformity. Neuro:  Nonfocal  Psych: Normal affect   Labs    High Sensitivity Troponin:   Recent Labs  Lab 03/31/23 2200 04/01/23 0353  TROPONINIHS 19* 38*     Chemistry Recent Labs  Lab 03/31/23 2200 04/01/23 0547 04/02/23 0547 04/03/23 0534  NA 139 142 142 141  K 4.5 4.4 4.3 4.3  CL 109 111 109 111  CO2 19* 23 25 19*  GLUCOSE 198* 167* 193* 180*  BUN 31* 29* 20 15  CREATININE 0.76 0.74 0.57* 0.66  CALCIUM 9.0 8.8* 8.8* 9.1  PROT 6.5  --   --   --   ALBUMIN 2.1*  --   --   --   AST 23  --   --   --   ALT 28  --   --   --   ALKPHOS 670*  --   --   --   BILITOT 1.2*  --   --   --   GFRNONAA >60 >60 >60 >60  ANIONGAP 11 8 8  11  Lipids No results for input(s): "CHOL", "TRIG", "HDL", "LABVLDL", "LDLCALC", "CHOLHDL" in the last 168 hours.  Hematology Recent Labs  Lab 04/01/23 0547 04/02/23 0547 04/03/23 0534  WBC 9.9 14.8* 14.6*  RBC 2.46* 3.35* 4.00*  HGB 6.5* 8.7* 10.2*  HCT 21.8* 28.0* 32.5*  MCV 88.6 83.6 81.3  MCH 26.4 26.0 25.5*  MCHC 29.8* 31.1 31.4  RDW 16.8* 18.2* 18.1*  PLT 161 170 223   Thyroid No results for input(s): "TSH", "FREET4" in the last 168 hours.  BNPNo results for input(s): "BNP", "PROBNP" in the last 168 hours.  DDimer No results for input(s): "DDIMER" in the last 168 hours.   Radiology    US Venous Img Lower Bilateral (DVT) Result Date: 04/02/2023 CLINICAL DATA:  BILATERAL lower extremity pain times months. 161096 Lower extremity pain [id number] EXAM: BILATERAL LOWER EXTREMITY VENOUS DOPPLER ULTRASOUND TECHNIQUE: Gray-scale sonography with graded compression, as well as color Doppler and duplex ultrasound were performed to evaluate the lower extremity deep venous systems from the level of the common femoral vein and including the common femoral, femoral, profunda femoral, popliteal and calf veins including the  posterior tibial, peroneal and gastrocnemius veins when visible. The superficial great saphenous vein was also interrogated. Spectral Doppler was utilized to evaluate flow at rest and with distal augmentation maneuvers in the common femoral, femoral and popliteal veins. COMPARISON:  Lower extremity venous duplex, most recently 03/25/2023, POSITIVE for BILATERAL lower extremity DVT of the RIGHT femoral and LEFT popliteal veins. FINDINGS: RIGHT LOWER EXTREMITY VENOUS Normal compressibility of the RIGHT common femoral and visualized portions of profunda femoral vein and great saphenous veins. Similar appearance of near-occlusive filling defect involving the RIGHT femoral, popliteal and visualized portions of the calf veins OTHER No evidence of superficial thrombophlebitis or abnormal fluid collection. Limitations: none LEFT LOWER EXTREMITY VENOUS Normal compressibility of the LEFT common femoral, superficial femoral, profunda femoral vein and great saphenous veins. Similar appearance of near-occlusive filling defect within the LEFT popliteal and visualized portions of the calf veins. OTHER No evidence of superficial thrombophlebitis or abnormal fluid collection. Limitations: none IMPRESSION: Since lower extremity venous duplex dated 03/25/2023; 1. No new or worsening DVT within either lower extremity. 2. Similar appearance of BILATERAL lower extremity DVT, extending from the RIGHT femoral through the calf veins and LEFT popliteal through the calf veins Roanna Banning, MD [facility] Radiology Electronically Signed   By: Roanna Banning M.D.   On: 04/02/2023 13:38   ECHOCARDIOGRAM COMPLETE Result Date: 04/01/2023    ECHOCARDIOGRAM REPORT   Patient Name:   Tony Benson Date of Exam: 04/01/2023 Medical Rec #:  [id number]     Height:       70.0 in Accession #:    [id number]    Weight:       149.0 lb Date of Birth:  Jan 16, 1961    BSA:          1.842 m Patient Age:    62 years       BP:           109/52 mmHg Patient Gender: M             HR:           100 bpm. Exam Location:  [facility] Procedure: 2D Echo, Cardiac Doppler and Color Doppler Indications:     Pericardial Effusion I31.3  History:         Patient has prior history of Echocardiogram examinations, most  recent 03/04/2023. Risk Factors:Diabetes, Hypertension and                  Sleep Apnea.  Sonographer:     Cristela Blue Referring Phys:  [id number] JAN A MANSY Diagnosing Phys: Yvonne Kendall MD IMPRESSIONS  1. Left ventricular ejection fraction, by estimation, is 60 to 65%. The left ventricle has normal function. The left ventricle has no regional wall motion abnormalities. There is moderate left ventricular hypertrophy. Left ventricular diastolic parameters are consistent with Grade II diastolic dysfunction (pseudonormalization).  2. Right ventricular systolic function is normal. The right ventricular size is normal. There is mildly elevated pulmonary artery systolic pressure.  3. There is equivocal early diastolic collapse of the right ventricle. However, mitral and tricuspid valve inflow velocity respiratory variation is not consistent with tamponade physiology.. Large pericardial effusion. The pericardial effusion is circumferential.  4. The mitral valve is normal in structure. Trivial mitral valve regurgitation. No evidence of mitral stenosis.  5. The aortic valve is tricuspid. Aortic valve regurgitation is not visualized. No aortic stenosis is present.  6. The inferior vena cava is normal in size with <50% respiratory variability, suggesting right atrial pressure of 8 mmHg. Comparison(s): A prior study was performed on 03/04/2023. The pericardial effusion appears somewhat larger. FINDINGS  Left Ventricle: Left ventricular ejection fraction, by estimation, is 60 to 65%. The left ventricle has normal function. The left ventricle has no regional wall motion abnormalities. The left ventricular internal cavity size was  normal in size. There is  moderate left ventricular hypertrophy. Left ventricular diastolic parameters are consistent with Grade II diastolic dysfunction (pseudonormalization). Right Ventricle: The right ventricular size is normal. No increase in right ventricular wall thickness. Right ventricular systolic function is normal. There is mildly elevated pulmonary artery systolic pressure. The tricuspid regurgitant velocity is 3.00  m/s, and with an assumed right atrial pressure of 8 mmHg, the estimated right ventricular systolic pressure is 44.0 mmHg. Left Atrium: Left atrial size was normal in size. Right Atrium: Right atrial size was normal in size. Pericardium: There is equivocal early diastolic collapse of the right ventricle. However, mitral and tricuspid valve inflow velocity respiratory variation is not consistent with tamponade physiology. A large pericardial effusion is present. The pericardial effusion is circumferential. Mitral Valve: The mitral valve is normal in structure. Trivial mitral valve regurgitation. No evidence of mitral valve stenosis. MV peak gradient, 4.8 mmHg. The mean mitral valve gradient is 3.0 mmHg. Tricuspid Valve: The tricuspid valve is normal in structure. Tricuspid valve regurgitation is mild. Aortic Valve: The aortic valve is tricuspid. Aortic valve regurgitation is not visualized. No aortic stenosis is present. Aortic valve mean gradient measures 4.0 mmHg. Aortic valve peak gradient measures 6.0 mmHg. Aortic valve area, by VTI measures 2.91 cm. Pulmonic Valve: The pulmonic valve was grossly normal. Pulmonic valve regurgitation is trivial. No evidence of pulmonic stenosis. Aorta: The aortic root is normal in size and structure. Pulmonary Artery: The pulmonary artery is not well seen. Venous: The inferior vena cava is normal in size with less than 50% respiratory variability, suggesting right atrial pressure of 8 mmHg. IAS/Shunts: No atrial level shunt detected by color flow Doppler.   LEFT VENTRICLE PLAX 2D LVIDd:         4.10 cm   Diastology LVIDs:         1.80 cm   LV e' medial:    6.42 cm/s LV PW:         1.50 cm   LV  E/e' medial:  13.8 LV IVS:        1.20 cm   LV e' lateral:   15.00 cm/s LVOT diam:     2.10 cm   LV E/e' lateral: 5.9 LV SV:         58 LV SV Index:   32 LVOT Area:     3.46 cm  RIGHT VENTRICLE RV Basal diam:  2.90 cm RV Mid diam:    2.50 cm RV S prime:     20.90 cm/s TAPSE (M-mode): 1.8 cm LEFT ATRIUM           Index        RIGHT ATRIUM          Index LA diam:      2.40 cm 1.30 cm/m   RA Area:     9.46 cm LA Vol (A4C): 20.3 ml 11.02 ml/m  RA Volume:   14.30 ml 7.76 ml/m  AORTIC VALVE AV Area (Vmax):    2.70 cm AV Area (Vmean):   2.44 cm AV Area (VTI):     2.91 cm AV Vmax:           122.00 cm/s AV Vmean:          90.900 cm/s AV VTI:            0.200 m AV Peak Grad:      6.0 mmHg AV Mean Grad:      4.0 mmHg LVOT Vmax:         95.00 cm/s LVOT Vmean:        64.000 cm/s LVOT VTI:          0.168 m LVOT/AV VTI ratio: 0.84  AORTA Ao Root diam: 3.05 cm MITRAL VALVE               TRICUSPID VALVE MV Area (PHT): 4.57 cm    TR Peak grad:   36.0 mmHg MV Area VTI:   2.32 cm    TR Vmax:        300.00 cm/s MV Peak grad:  4.8 mmHg MV Mean grad:  3.0 mmHg    SHUNTS MV Vmax:       1.09 m/s    Systemic VTI:  0.17 m MV Vmean:      80.1 cm/s   Systemic Diam: 2.10 cm MV Decel Time: 166 msec MV E velocity: 88.60 cm/s MV A velocity: 78.10 cm/s MV E/A ratio:  1.13 Yvonne Kendall MD Electronically signed by Yvonne Kendall MD Signature Date/Time: 04/01/2023/2:19:37 PM    Final     Cardiac Studies   04/01/23 Echocardiogram 1. Left ventricular ejection fraction, by estimation, is 60 to 65%. The  left ventricle has normal function. The left ventricle has no regional  wall motion abnormalities. There is moderate left ventricular hypertrophy.  Left ventricular diastolic  parameters are consistent with Grade II diastolic dysfunction  (pseudonormalization).   2. Right ventricular systolic  function is normal. The right ventricular  size is normal. There is mildly elevated pulmonary artery systolic  pressure.   3. There is equivocal early diastolic collapse of the right ventricle.  However, mitral and tricuspid valve inflow velocity respiratory variation  is not consistent with tamponade physiology.. Large pericardial effusion.  The pericardial effusion is  circumferential.   4. The mitral valve is normal in structure. Trivial mitral valve  regurgitation. No evidence of mitral stenosis.   5. The aortic valve is tricuspid. Aortic valve regurgitation is not  visualized. No aortic  stenosis is present.   6. The inferior vena cava is normal in size with <50% respiratory  variability, suggesting right atrial pressure of 8 mmHg.   Comparison(s): A prior study was performed on 03/04/2023. The pericardial  effusion appears somewhat larger.    03/04/23 Echocardiogram 1. Left ventricular ejection fraction, by estimation, is >55%. The left  ventricle has normal function. The left ventricle has no regional wall  motion abnormalities.   2. Right ventricular systolic function is normal. The right ventricular  size is normal.   3. Moderate pericardial effusion. The pericardial effusion is  circumferential. There is no evidence of cardiac tamponade.   4. The mitral valve is normal in structure.    03/01/23 Echocardiogram 1. Left ventricular ejection fraction, by estimation, is 60 to 65%. The  left ventricle has normal function. The left ventricle has no regional  wall motion abnormalities. Left ventricular diastolic parameters are  indeterminate.   2. Right ventricular systolic function is normal. The right ventricular  size is normal. There is moderately elevated pulmonary artery systolic  pressure. The estimated right ventricular systolic pressure is 54.0 mmHg.   3. Large pericardial effusion. The pericardial effusion is  circumferential. There is no evidence of cardiac tamponade.  1.95 off the  RV free wall, 2.16 cm off the apical region, 1.70 cm off the LV free wall.  IVC is not dilated at 1.16 cm   4. The mitral valve is normal in structure. No evidence of mitral valve  regurgitation. No evidence of mitral stenosis.   5. The aortic valve is tricuspid. Aortic valve regurgitation is not  visualized. No aortic stenosis is present.   6. The inferior vena cava is normal in size with greater than 50%  respiratory variability, suggesting right atrial pressure of 3 mmHg.  Patient Profile     Tony Benson is a 62 y.o. male with a hx of metastatic pancreatic cancer on palliative chemotherapy, asthma, prostate cancer, T2DM, HTN, dyslipidemia, OSA, and hx DVT and PE on Eliquis who is being seen for the continued evaluation of pericardial effusion.   Assessment & Plan    Pericardial effusion - First noted on CT angiogram 02/27/2023. Echocardiogram on 03/01/23 showed large circumferential pericardial effusion, repeat echocardiogram on 03/04/23 showed moderate circumferential pericardial effusion, both without evidence of tamponade physiology - Most recent echo 04/01/23 showed pericardial effusion slightly increased compared to previous echo - Suspect secondary to metastatic pancreatic cancer - Not a good candidate for invasive procedures in the setting of metastatic pancreatic cancer and acute anemia. Patient is hemodynamically stable. No urgent indication for pericardiocentesis.  - Eliquis was held for concern of increasing pericardial effusion/anemia - Hemoglobin 8.7>>10.2 today - Started on IV heparin yesterday evening, consider switching to PTA PO Eliquis 5 mg if no further procedures planned  Elevated troponin - Troponin peaked at 38 on 12/17 - Suspect demand ischemia - Denies chest pain, EKG without ischemic changes - Echo showed LVEF 60-65% without wall motional abnormalities  Hx DVT/PE - Admitted at [facility] in 02/2023 for small bilateral pulmonary emboli and  bilateral LE DVTs. Discharged on Eliquis 5 mg.  - Eliquis was held for concern of increasing pericardial effusion/anemia - Started on IV heparin yesterday evening, consider switching to PTA PO Eliquis 5 mg if no further procedures planned   For questions or updates, please contact  [facility] Please consult [email or web address] for contact info under      Signed, Orion Crook, PA-C  04/03/2023, 11:01 AM

## 2023-04-03 NOTE — Consult Note (Incomplete Revision)
 Palliative Medicine [facility] at [facility] Telephone:([phone]) [phone] Fax:(336) [phone]   Name: Tony Benson Date: 04/03/2023 MRN: [id number]  DOB: [birth year]  Patient Care Team: Sherrie Mustache, MD as PCP - General (Internal Medicine) End, Cristal Deer, MD as PCP - Cardiology (Cardiology) Luretha Murphy, MD as Consulting Physician (General Surgery) Earna Coder, MD as Consulting Physician (Oncology) Benita Gutter, RN as Oncology Nurse Navigator    REASON FOR CONSULTATION: Tony Benson is a 62 y.o. male with multiple medical problems including OSA on CPAP, diabetes.  Patient was hospitalized November 2024 with shortness of breath/acute PE, bilateral DVT.  CT of the abdomen and pelvis found to have a pancreatic head mass and liver masses.  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  Patient is status post 2 cycles of gemcitabine and Abraxane.  Now admitted to the hospital with pneumonia.  Palliative care was consulted to address goals and manage ongoing symptoms.   SOCIAL HISTORY:     reports that he has never smoked. He has never used smokeless tobacco. He reports that he does not drink alcohol and does not use drugs.  Patient is married lives at home with his wife.  They have a daughter in [place] who is a Engineer, civil (consulting).  Patient worked in [employer] fund[employer].  ADVANCE DIRECTIVES:  Not on file  CODE STATUS: Full code  PAST MEDICAL HISTORY: Past Medical History:  Diagnosis Date   Asthma    Cancer (HCC)    prostate   Diabetes mellitus without complication (HCC)    type II   Hyperlipidemia    Hypertension    Sleep apnea    uses Cpap machine     PAST SURGICAL HISTORY:  Past Surgical History:  Procedure Laterality Date   CHOLECYSTECTOMY     COLONOSCOPY WITH PROPOFOL N/A 11/18/2018   Procedure: COLONOSCOPY WITH PROPOFOL;  Surgeon: Pasty Spillers, MD;   Location: [facility] ENDOSCOPY;  Service: Gastroenterology;  Laterality: N/A;   COLONOSCOPY WITH PROPOFOL N/A 02/05/2023   Procedure: COLONOSCOPY WITH PROPOFOL;  Surgeon: Toney Reil, MD;  Location: [facility] ENDOSCOPY;  Service: Gastroenterology;  Laterality: N/A;   IR IMAGING GUIDED PORT INSERTION  03/24/2023   IR US LIVER BIOPSY  03/03/2023   LAPAROSCOPIC RETROPUBIC PROSTATECTOMY     2023   LUNG SURGERY     "ligation of thoracic duct"   PROSTATE BIOPSY N/A 09/21/2020   Procedure: PROSTATE BIOPSY [name];  Surgeon: [name], MD;  Location: [facility] ORS;  Service: Urology;  Laterality: N/A;    HEMATOLOGY/ONCOLOGY HISTORY:  Oncology History Overview Note   # NOV 2024- Liver, needle/core biopsy,  :      - METASTATIC CARCINOMA      - SEE NOTE       Diagnosis Note : The patient's clinical history of prostate cancer, recent      pleural effusion and MRI findings significant for a large head of pancreas mass      and multiple liver masses are noted.  Immunohistochemistry is performed on block      1B.  The tumor cells are diffusely positive for CK7 while negative for CK20,      CDX-2, TTF-1, and NKX3.1 (prostate marker).  The immunohistochemical staining      pattern is not specific and may be seen in cancers of the upper GI tract,      pancreaticobiliary system, lung (less likely due to TTF-1 negativity) and  breast.  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  Clinical and radiological correlation recommended.  Dr. [place]      reviewed the case and agrees with the above diagnosis.   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  MEDICATIONS:  Current Facility-Administered Medications  Medication Dose Route Frequency Provider Last Rate Last Admin   acetaminophen (TYLENOL) tablet 650 mg  650 mg Oral Q6H PRN Mansy, Jan A, MD       Or   acetaminophen (TYLENOL) suppository 650 mg  650 mg Rectal Q6H PRN Mansy, Vernetta Honey, MD       ALPRAZolam Prudy Feeler) tablet 0.5 mg  0.5 mg Oral BID PRN Mansy, Jan A, MD       amLODipine (NORVASC) tablet 10 mg  10 mg Oral Daily Mansy, Jan A, MD       atorvastatin (LIPITOR) tablet 10 mg  10 mg Oral Daily Mansy, Jan A, MD   10 mg at 04/03/23 1031   azithromycin (ZITHROMAX) 500 mg in sodium chloride 0.9 % 250 mL IVPB  500 mg Intravenous Q24H Mansy, Jan A, MD 250 mL/hr at 04/03/23 0554 Infusion Verify at 04/03/23 0554   cefTRIAXone (ROCEPHIN) 2 g in sodium chloride 0.9 % 100 mL IVPB  2 g Intravenous Q24H Mansy, Jan A, MD   Stopped at 04/03/23 0533   Chlorhexidine Gluconate Cloth 2 % PADS 6 each  6 each Topical Daily Loyce Dys, MD   6 each at 04/03/23 1212   chlorpheniramine-HYDROcodone (TUSSIONEX) 10-8 MG/5ML suspension 5 mL  5 mL Oral Q12H PRN Mansy, Jan A, MD       empagliflozin (JARDIANCE) tablet 25 mg  25 mg Oral Daily Mansy, Jan A, MD   25 mg at 04/03/23 1033   feeding supplement (ENSURE ENLIVE / ENSURE PLUS) liquid 237 mL  237 mL Oral BID BM Mansy, Jan A, MD   237 mL at 04/03/23 1032   fentaNYL (DURAGESIC) 12 MCG/HR 1 patch  1 patch Transdermal Q72H Mansy, Jan A, MD   1 patch at 04/02/23 1110   guaiFENesin (ROBITUSSIN) 100 MG/5ML liquid 15 mL  15 mL Oral Q6H Jawo, Modou L, NP   15 mL at 04/03/23 1029   heparin ADULT infusion 100 units/mL (25000 units/237mL)  1,250 Units/hr Intravenous Continuous Rosezetta Schlatter T, MD 12.5 mL/hr at 04/03/23 0554 1,250 Units/hr at 04/03/23 0554   HYDROmorphone (DILAUDID) tablet 2-4 mg  2-4 mg Oral Q4H PRN Rosezetta Schlatter T, MD       insulin aspart (novoLOG) injection 0-15 Units  0-15 Units Subcutaneous TID AC & HS Manuela Schwartz, NP   5 Units at 04/03/23 1320   ipratropium-albuterol (DUONEB) 0.5-2.5 (3) MG/3ML nebulizer solution 3 mL  3 mL Nebulization BID Rosezetta Schlatter T, MD       magnesium hydroxide (MILK OF  MAGNESIA) suspension 30 mL  30 mL Oral Daily PRN Mansy, Jan A, MD       metoprolol tartrate (LOPRESSOR) tablet 50 mg  50 mg Oral BID Mansy, Jan A, MD   50 mg at 04/03/23 1031   naloxone [facility]) nasal spray 4 mg/0.1 mL  1 spray Nasal PRN Mansy, Jan A, MD       OLANZapine (ZYPREXA) tablet 5 mg  5 mg Oral QHS Mansy, Jan A, MD   5 mg at 04/02/23 2108   omega-3 acid ethyl esters (LOVAZA) capsule 1 g  1 g Oral Daily Mansy, Jan A, MD   1 g at 04/03/23 1031   ondansetron (ZOFRAN) tablet 4 mg  4 mg Oral Q6H PRN Mansy, Jan A, MD       Or   ondansetron [facility]) injection 4 mg  4 mg Intravenous Q6H PRN Mansy, Jan A, MD       traZODone (DESYREL) tablet 25 mg  25 mg Oral QHS PRN Mansy, Jan A, MD        VITAL SIGNS: BP 102/63 (BP Location: Right Arm)   Pulse (!) 110   Temp 97.9 F (36.6 C)   Resp 17   Ht 5\' 10"  (1.778 m)   Wt 149 lb 0.5 oz (67.6 kg)   SpO2 98%   BMI 21.38 kg/m  Filed Weights   03/31/23 1910  Weight: 149 lb 0.5 oz (67.6 kg)    Estimated body mass index is 21.38 kg/m as calculated from the following:   Height as of this encounter: 5\' 10"  (1.778 m).   Weight as of this encounter: 149 lb 0.5 oz (67.6 kg).  LABS: CBC:    Component Value Date/Time   WBC 14.6 (H) 04/03/2023 0534   HGB 10.2 (L) 04/03/2023 0534   HGB 8.4 (L) 03/26/2023 0813   HCT 32.5 (L) 04/03/2023 0534   PLT 223 04/03/2023 0534   PLT 567 (H) 03/26/2023 0813   MCV 81.3 04/03/2023 0534   NEUTROABS 12.9 (H) 04/03/2023 0534   LYMPHSABS 0.5 (L) 04/03/2023 0534   MONOABS 0.7  04/03/2023 0534   EOSABS 0.3 04/03/2023 0534   BASOSABS 0.0 04/03/2023 0534   Comprehensive Metabolic Panel:    Component Value Date/Time   NA 141 04/03/2023 0534   K 4.3 04/03/2023 0534   CL 111 04/03/2023 0534   CO2 19 (L) 04/03/2023 0534   BUN 15 04/03/2023 0534   CREATININE 0.66 04/03/2023 0534   CREATININE 0.52 (L) 03/26/2023 0813   GLUCOSE 180 (H) 04/03/2023 0534   CALCIUM 9.1 04/03/2023 0534   AST 23 03/31/2023 2200   AST 22 03/26/2023 0813   ALT 28 03/31/2023 2200   ALT 19 03/26/2023 0813   ALKPHOS 670 (H) 03/31/2023 2200   BILITOT 1.2 (H) 03/31/2023 2200   BILITOT 0.7 03/26/2023 0813   PROT 6.5 03/31/2023 2200   ALBUMIN 2.1 (L) 03/31/2023 2200    RADIOGRAPHIC STUDIES: US Venous Img Lower Bilateral (DVT) Result Date: 04/02/2023 CLINICAL DATA:  BILATERAL lower extremity pain times months. [id number] Lower extremity pain [id number] EXAM: BILATERAL LOWER EXTREMITY VENOUS DOPPLER ULTRASOUND TECHNIQUE: Gray-scale sonography with graded compression, as well as color Doppler and duplex ultrasound were performed to evaluate the lower extremity deep venous systems from the level of the common femoral vein and including the common femoral, femoral, profunda femoral, popliteal and calf veins including the posterior tibial, peroneal and gastrocnemius veins when visible. The superficial great saphenous vein was also interrogated. Spectral Doppler was utilized  to evaluate flow at rest and with distal augmentation maneuvers in the common femoral, femoral and popliteal veins. COMPARISON:  Lower extremity venous duplex, most recently 03/25/2023, POSITIVE for BILATERAL lower extremity DVT of the RIGHT femoral and LEFT popliteal veins. FINDINGS: RIGHT LOWER EXTREMITY VENOUS Normal compressibility of the RIGHT common femoral and visualized portions of profunda femoral vein and great saphenous veins. Similar appearance of near-occlusive filling defect involving the RIGHT femoral, popliteal and visualized  portions of the calf veins OTHER No evidence of superficial thrombophlebitis or abnormal fluid collection. Limitations: none LEFT LOWER EXTREMITY VENOUS Normal compressibility of the LEFT common femoral, superficial femoral, profunda femoral vein and great saphenous veins. Similar appearance of near-occlusive filling defect within the LEFT popliteal and visualized portions of the calf veins. OTHER No evidence of superficial thrombophlebitis or abnormal fluid collection. Limitations: none IMPRESSION: Since lower extremity venous duplex dated 03/25/2023; 1. No new or worsening DVT within either lower extremity. 2. Similar appearance of BILATERAL lower extremity DVT, extending from the RIGHT femoral through the calf veins and LEFT popliteal through the calf veins Roanna Banning, MD [facility] Radiology Electronically Signed   By: Roanna Banning M.D.   On: 04/02/2023 13:38   ECHOCARDIOGRAM COMPLETE Result Date: 04/01/2023    ECHOCARDIOGRAM REPORT   Patient Name:   Tony Benson Date of Exam: 04/01/2023 Medical Rec #:  [id number]     Height:       70.0 in Accession #:    [id number]    Weight:       149.0 lb Date of Birth:  [birth year]    BSA:          1.842 m Patient Age:    62 years      BP:           109/52 mmHg Patient Gender: M             HR:           100 bpm. Exam Location:  [facility] Procedure: 2D Echo, Cardiac Doppler and Color Doppler Indications:     Pericardial Effusion I31.3  History:         Patient has prior history of Echocardiogram examinations, most                  recent 03/04/2023. Risk Factors:Diabetes, Hypertension and                  Sleep Apnea.  Sonographer:     Cristela Blue Referring Phys:  [id number] JAN A MANSY Diagnosing Phys: Yvonne Kendall MD IMPRESSIONS  1. Left ventricular ejection fraction, by estimation, is 60 to 65%. The left ventricle has normal function. The left ventricle has no regional wall motion abnormalities. There is moderate left  ventricular hypertrophy. Left ventricular diastolic parameters are consistent with Grade II diastolic dysfunction (pseudonormalization).  2. Right ventricular systolic function is normal. The right ventricular size is normal. There is mildly elevated pulmonary artery systolic pressure.  3. There is equivocal early diastolic collapse of the right ventricle. However, mitral and tricuspid valve inflow velocity respiratory variation is not consistent with tamponade physiology.. Large pericardial effusion. The pericardial effusion is circumferential.  4. The mitral valve is normal in structure. Trivial mitral valve regurgitation. No evidence of mitral stenosis.  5. The aortic valve is tricuspid. Aortic valve regurgitation is not visualized. No aortic stenosis is present.  6. The inferior vena cava is normal in size with <50% respiratory variability, suggesting right  atrial pressure of 8 mmHg. Comparison(s): A prior study was performed on 03/04/2023. The pericardial effusion appears somewhat larger. FINDINGS  Left Ventricle: Left ventricular ejection fraction, by estimation, is 60 to 65%. The left ventricle has normal function. The left ventricle has no regional wall motion abnormalities. The left ventricular internal cavity size was normal in size. There is  moderate left ventricular hypertrophy. Left ventricular diastolic parameters are consistent with Grade II diastolic dysfunction (pseudonormalization). Right Ventricle: The right ventricular size is normal. No increase in right ventricular wall thickness. Right ventricular systolic function is normal. There is mildly elevated pulmonary artery systolic pressure. The tricuspid regurgitant velocity is 3.00  m/s, and with an assumed right atrial pressure of 8 mmHg, the estimated right ventricular systolic pressure is 44.0 mmHg. Left Atrium: Left atrial size was normal in size. Right Atrium: Right atrial size was normal in size. Pericardium: There is equivocal early  diastolic collapse of the right ventricle. However, mitral and tricuspid valve inflow velocity respiratory variation is not consistent with tamponade physiology. A large pericardial effusion is present. The pericardial effusion is circumferential. Mitral Valve: The mitral valve is normal in structure. Trivial mitral valve regurgitation. No evidence of mitral valve stenosis. MV peak gradient, 4.8 mmHg. The mean mitral valve gradient is 3.0 mmHg. Tricuspid Valve: The tricuspid valve is normal in structure. Tricuspid valve regurgitation is mild. Aortic Valve: The aortic valve is tricuspid. Aortic valve regurgitation is not visualized. No aortic stenosis is present. Aortic valve mean gradient measures 4.0 mmHg. Aortic valve peak gradient measures 6.0 mmHg. Aortic valve area, by VTI measures 2.91 cm. Pulmonic Valve: The pulmonic valve was grossly normal. Pulmonic valve regurgitation is trivial. No evidence of pulmonic stenosis. Aorta: The aortic root is normal in size and structure. Pulmonary Artery: The pulmonary artery is not well seen. Venous: The inferior vena cava is normal in size with less than 50% respiratory variability, suggesting right atrial pressure of 8 mmHg. IAS/Shunts: No atrial level shunt detected by color flow Doppler.  LEFT VENTRICLE PLAX 2D LVIDd:         4.10 cm   Diastology LVIDs:         1.80 cm   LV e' medial:    6.42 cm/s LV PW:         1.50 cm   LV E/e' medial:  13.8 LV IVS:        1.20 cm   LV e' lateral:   15.00 cm/s LVOT diam:     2.10 cm   LV E/e' lateral: 5.9 LV SV:         58 LV SV Index:   32 LVOT Area:     3.46 cm  RIGHT VENTRICLE RV Basal diam:  2.90 cm RV Mid diam:    2.50 cm RV S prime:     20.90 cm/s TAPSE (M-mode): 1.8 cm LEFT ATRIUM           Index        RIGHT ATRIUM          Index LA diam:      2.40 cm 1.30 cm/m   RA Area:     9.46 cm LA Vol (A4C): 20.3 ml 11.02 ml/m  RA Volume:   14.30 ml 7.76 ml/m  AORTIC VALVE AV Area (Vmax):    2.70 cm AV Area (Vmean):   2.44 cm AV  Area (VTI):     2.91 cm AV Vmax:  122.00 cm/s AV Vmean:          90.900 cm/s AV VTI:            0.200 m AV Peak Grad:      6.0 mmHg AV Mean Grad:      4.0 mmHg LVOT Vmax:         95.00 cm/s LVOT Vmean:        64.000 cm/s LVOT VTI:          0.168 m LVOT/AV VTI ratio: 0.84  AORTA Ao Root diam: 3.05 cm MITRAL VALVE               TRICUSPID VALVE MV Area (PHT): 4.57 cm    TR Peak grad:   36.0 mmHg MV Area VTI:   2.32 cm    TR Vmax:        300.00 cm/s MV Peak grad:  4.8 mmHg MV Mean grad:  3.0 mmHg    SHUNTS MV Vmax:       1.09 m/s    Systemic VTI:  0.17 m MV Vmean:      80.1 cm/s   Systemic Diam: 2.10 cm MV Decel Time: 166 msec MV E velocity: 88.60 cm/s MV A velocity: 78.10 cm/s MV E/A ratio:  1.13 Yvonne Kendall MD Electronically signed by Yvonne Kendall MD Signature Date/Time: 04/01/2023/2:19:37 PM    Final    CT Angio Chest PE W/Cm &/Or Wo Cm Result Date: 04/01/2023 CLINICAL DATA:  Cough and fever for 3 days. On chemo for pancreatic cancer. Elevated heart rate. PE suspected. EXAM: CT ANGIOGRAPHY CHEST WITH CONTRAST TECHNIQUE: Multidetector CT imaging of the chest was performed using the standard protocol during bolus administration of intravenous contrast. Multiplanar CT image reconstructions and MIPs were obtained to evaluate the vascular anatomy. RADIATION DOSE REDUCTION: This exam was performed according to the departmental dose-optimization program which includes automated exposure control, adjustment of the mA and/or kV according to patient size and/or use of iterative reconstruction technique. CONTRAST:  75mL OMNIPAQUE IOHEXOL 350 MG/ML SOLN COMPARISON:  Same day chest radiograph and CTA chest 04/28/2022 FINDINGS: Cardiovascular: Right IJ CVC tip at the superior cavoatrial junction. Negative for acute pulmonary embolism. Large pericardial effusion increased compared to 02/27/2023. Clinical correlation recommended to exclude tamponade physiology. Mild aortic atherosclerotic calcification.  Mediastinum/Nodes: Debris layering within the posterior trachea. Esophagus is unremarkable. Surgical clips adjacent to the lower esophagus. Mediastinal and hilar adenopathy is not well evaluated. Lungs/Pleura: Diffuse bronchial wall thickening scattered mucous plugging greatest in the right lower lobe. Bilateral centrilobular micro nodules and tree-in-bud opacities with more confluence opacities in the right lung. Irregular pleural thickening or loculated pleural effusion in the lower right chest has increased compared to 02/27/2023. There is associated pleural nodularity in the posterior lower lobe. For example 14 x 9 mm nodule on series 5/image 97. Pleural calcifications in the posterior right lung base. No pneumothorax. Upper Abdomen: Incompletely evaluated large metastasis in the right hepatic lobe appears slightly increased compared to 02/27/2023, measuring up to 14.5 cm previously 13.3 cm. Partially visualized dilated main pancreatic duct. Pancreatic head is not included in the exam. Small amount of perisplenic ascites. Musculoskeletal: No acute fracture or destructive osseous lesion. Review of the MIP images confirms the above findings. IMPRESSION: 1. Negative for acute pulmonary embolism. 2. Large pericardial effusion increased compared to 02/27/2023. Clinical correlation recommended to exclude tamponade physiology. 3. Increased bilateral centrilobular micro nodules and tree-in-bud opacities with more confluence opacities in the right lung, likely due to  bronchopneumonia from aspiration. Lymphangitic spread of tumor could appear similarly. 4. Irregular pleural thickening or loculated pleural effusion in the lower right chest has increased compared to 02/27/2023. There is associated pleural nodularity in the posterior lower lobe. This may be due to bronchopneumonia though metastases are not excluded. 5. 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 Aortic Atherosclerosis (ICD10-I70.0). Electronically Signed    By: Minerva Fester M.D.   On: 04/01/2023 00:53   DG Chest 2 View Result Date: 03/31/2023 CLINICAL DATA:  Cough and fever EXAM: CHEST - 2 VIEW COMPARISON:  Chest x-ray 03/05/2023.  Chest CT 02/27/2023. FINDINGS: There is a new right chest port with distal catheter tip projecting over the distal SVC. The cardiac silhouette appears mildly enlarged, unchanged. Small right pleural effusion persists. Patchy opacities in the right lung base have decreased, but have not completely resolved. There is a new focal nodular density in the left mid lung measuring 8 mm. The left lung is otherwise clear. There is no pneumothorax or acute fracture. IMPRESSION: 1. Small right pleural effusion persists. 2. Patchy opacities in the right lung base have decreased, but have not completely resolved. 3. New 8 mm nodular density in the left mid lung. Recommend follow-up chest CT. Electronically Signed   By: Darliss Cheney M.D.   On: 03/31/2023 21:10   NM Bone Scan Whole Body Result Date: 03/30/2023 CLINICAL DATA:  Metastatic pancreatic cancer. Evaluate for bone disease. EXAM: NUCLEAR MEDICINE WHOLE BODY BONE SCAN TECHNIQUE: Whole body anterior and posterior images were obtained approximately 3 hours after intravenous injection of radiopharmaceutical. RADIOPHARMACEUTICALS:  21.78 mCi Technetium-69m MDP IV COMPARISON:  CTA chest 02/27/2023 and abdominal MRI 02/28/2023 FINDINGS: No abnormal uptake involving the axial or appendicular skeleton. Expected uptake in the renal collecting systems and urinary bladder. IMPRESSION: Negative bone scan.  No evidence for metastatic bone disease. Electronically Signed   By: Richarda Overlie M.D.   On: 03/30/2023 21:01   US Venous Img Lower Bilateral Result Date: 03/25/2023 CLINICAL DATA:  Follow-up lower extremity DVT. History of prostate cancer. Patient is on anticoagulation. EXAM: BILATERAL LOWER EXTREMITY VENOUS DOPPLER ULTRASOUND TECHNIQUE: Gray-scale sonography with graded compression, as well  as color Doppler and duplex ultrasound were performed to evaluate the lower extremity deep venous systems from the level of the common femoral vein and including the common femoral, femoral, profunda femoral, popliteal and calf veins including the posterior tibial, peroneal and gastrocnemius veins when visible. The superficial great saphenous vein was also interrogated. Spectral Doppler was utilized to evaluate flow at rest and with distal augmentation maneuvers in the common femoral, femoral and popliteal veins. COMPARISON:  Bilateral lower extremity venous Doppler ultrasound-02/26/2023 (positive for DVT extending from right femoral vein through right tibial veins as well as the left popliteal vein through the left tibial veins) FINDINGS: RIGHT LOWER EXTREMITY Common Femoral Vein: No evidence of thrombus. Normal compressibility, respiratory phasicity and response to augmentation. Saphenofemoral Junction: No evidence of thrombus. Normal compressibility and flow on color Doppler imaging. Profunda Femoral Vein: No evidence of thrombus. Normal compressibility and flow on color Doppler imaging. Femoral Vein: There is predominantly occlusive DVT involving the proximal (image 41), mid (image 44) and distal (image 47) aspects of the right femoral vein, grossly unchanged compared to the 02/26/2023 examination. Popliteal Vein: Redemonstrated near occlusive DVT involving the right popliteal vein (images 47 and 50), similar to the 02/26/2023 examination. Calf Veins: Redemonstrated occlusive DVT involving both paired divisions of the left posterior tibial (image 56) as well as both  paired divisions of the right peroneal veins (image 58). Superficial Great Saphenous Vein: No evidence of thrombus. Normal compressibility. Other Findings:  None. _________________________________________________________ LEFT LOWER EXTREMITY Common Femoral Vein: No evidence of thrombus. Normal compressibility, respiratory phasicity and response to  augmentation. Saphenofemoral Junction: No evidence of thrombus. Normal compressibility and flow on color Doppler imaging. Profunda Femoral Vein: No evidence of thrombus. Normal compressibility and flow on color Doppler imaging. Femoral Vein: No evidence of thrombus. Normal compressibility, respiratory phasicity and response to augmentation. Popliteal Vein: Redemonstrated occlusive DVT involving the left popliteal vein (images 21 and 25), unchanged compared to the 02/26/2023 examination. Calf Veins: Redemonstrated occlusive DVT involving both paired divisions of the left posterior tibial vein (images 27 and 28), unchanged compared to the 02/26/2023 examination. The left peroneal veins appear patent where imaged (image 30), improved compared to the 02/26/2023 examination. Superficial Great Saphenous Vein: No evidence of thrombus. Normal compressibility. Other Findings:  None. IMPRESSION: 1. Redemonstrated bilateral lower extremity DVT extending from the right femoral vein through the right calf veins, grossly unchanged compared to the 02/26/2023 examination. 2. Resolved left peroneal DVT. 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 Electronically Signed   By: Simonne Come M.D.   On: 03/25/2023 12:10   IR IMAGING GUIDED PORT INSERTION Result Date: 03/24/2023 CLINICAL DATA:  Metastatic pancreas CANCER TO THE LIVER EXAM: RIGHT INTERNAL JUGULAR SINGLE LUMEN POWER PORT CATHETER INSERTION Date:  03/24/2023 03/24/2023 11:55 am Radiologist:  M. Ruel Favors, MD Guidance:  Ultrasound and fluoroscopic MEDICATIONS: 1% lidocaine local with epinephrine ANESTHESIA/SEDATION: Versed 0.5 mg IV; Fentanyl 25 mcg IV; Moderate Sedation Time:  30 minutes The patient was continuously monitored during the procedure by the interventional radiology nurse under my direct supervision. FLUOROSCOPY: 0 minutes, 71 seconds  (3.4 mGy) COMPLICATIONS: None immediate. CONTRAST:  None. PROCEDURE: Informed consent was obtained from the patient following explanation of the procedure, risks, benefits and alternatives. The patient understands, agrees and consents for the procedure. All questions were addressed. A time out was performed. Maximal barrier sterile technique utilized including caps, mask, sterile gowns, sterile gloves, large sterile drape, hand hygiene, and 2% chlorhexidine scrub. Under sterile conditions and local anesthesia, right internal jugular micropuncture venous access was performed. Access was performed with ultrasound. Images were obtained for documentation of the patent right internal jugular vein. A guide wire was inserted followed by a transitional dilator. This allowed insertion of a guide wire and catheter into the IVC. Measurements were obtained from the SVC / RA junction back to the right IJ venotomy site. In the right infraclavicular chest, a subcutaneous pocket was created over the second anterior rib. This was done under sterile conditions and local anesthesia. 1% lidocaine with epinephrine was utilized for this. A 2.5 cm incision was made in the skin. Blunt dissection was performed to create a subcutaneous pocket over the right pectoralis major muscle. The pocket was flushed with saline vigorously. There was adequate hemostasis. The port catheter was assembled and checked for leakage. The port catheter was secured in the pocket with two retention sutures. The tubing was tunneled subcutaneously to the right venotomy site and inserted into the SVC/RA junction through a valved peel-away sheath. Position was confirmed with fluoroscopy. Images were obtained for documentation. The patient tolerated the procedure well. No immediate complications. Incisions were closed in a two layer fashion with 4 - 0 Vicryl suture. Dermabond was applied to the skin.  The port catheter was accessed, blood was aspirated followed by  saline and heparin flushes. Needle was removed. A dry sterile dressing was applied. IMPRESSION: Ultrasound and fluoroscopically guided right internal jugular single lumen power port catheter insertion. Tip in the SVC/RA junction. Catheter ready for use. Electronically Signed   By: Judie Petit.  Shick M.D.   On: 03/24/2023 12:17   DG Chest Port 1 View Result Date: 03/05/2023 CLINICAL DATA:  Shortness of breath.  Follow-up exam. EXAM: PORTABLE CHEST 1 VIEW COMPARISON:  Chest radiograph dated 02/27/2023. FINDINGS: Small right pleural effusion. Right lung base opacity as well as faint clusters of ground-glass density involving the right mid to lower lung field consistent with pneumonia. Overall interval worsening of pulmonary infiltrate since the radiograph of 02/27/2023. The left lung is clear. No pneumothorax. Stable cardiac silhouette. No acute osseous pathology. IMPRESSION: Interval worsening of right lung pneumonia. Electronically Signed   By: Elgie Collard M.D.   On: 03/05/2023 23:52    PERFORMANCE STATUS (ECOG) : 3 - Symptomatic, >50% confined to bed  Review of Systems Unless otherwise noted, a complete review of systems is negative.  Physical Exam General: NAD Cardiovascular: regular rate and rhythm Pulmonary: clear ant fields Abdomen: soft, nontender, + bowel sounds GU: no suprapubic tenderness Extremities: no edema, no joint deformities Skin: no rashes Neurological: Weakness but otherwise nonfocal  IMPRESSION: Patient with stage IV pancreatic cancer.  He received cycle 2 gemcitabine Abraxane chemotherapy on 03/26/2023.  Now admitted 04/01/2023 with community-acquired pneumonia.    CTA of the chest on 03/31/2023 was negative for PE.  Shows increased large pericardial effusion, increased centrilobular nodules and tree-in-bud opacities likely secondary to aspiration pneumonia versus lymphangitic spread of the cancer, increased right pleural effusion, and decreased size of hepatic  metastasis.  I met with patient and daughter.  Patient's wife participated in the conversation via phone. He does have some early indication of possible treatment effect with decreased size of hepatic metastasis.  However, overall, he remains frail with poor performance status and minimal oral intake.  Family understand that patient's cancer is aggressive and his prognosis is poor.  Patient states clearly that his goal is to continue pursuing cancer treatment and that he is not ready to transition to less aggressive care.  I readdressed CODE STATUS and strongly encouraged patient to consider DNR/DNI.  I explained the almost certain futility associated with resuscitative efforts in the setting of an advanced stage malignancy.  However, patient states that he wants to remain a full code for now.  PLAN: -Continue current scope of treatment -Full code -Will follow  Case and plan discussed with Dr. Donneta Romberg   Time Total: 45 minutes  Visit consisted of counseling and education dealing with the complex and emotionally intense issues of symptom management and palliative care in the setting of serious and potentially life-threatening illness.Greater than 50%  of this time was spent counseling and coordinating care related to the above assessment and plan.  Signed by: Laurette Schimke, PhD, NP-C  Patient interested in continued scope of care.  I personally interviewed and examined the patient. Agreed with the above plan of care. Patient questions were answered. Dr.Allesha Aronoff MD

## 2023-04-03 NOTE — Evaluation (Signed)
Clinical/Bedside Swallow Evaluation Patient Details  Name: Tony Benson MRN: [id number] Date of Birth: 04-12-1961  Today's Date: 04/03/2023 Time: SLP Start Time (ACUTE ONLY): 1505 SLP Stop Time (ACUTE ONLY): 1555 SLP Time Calculation (min) (ACUTE ONLY): 50 min  Past Medical History:  Past Medical History:  Diagnosis Date   Asthma    Cancer (HCC)    prostate   Diabetes mellitus without complication (HCC)    type II   Hyperlipidemia    Hypertension    Sleep apnea    uses Cpap machine    Past Surgical History:  Past Surgical History:  Procedure Laterality Date   CHOLECYSTECTOMY     COLONOSCOPY WITH PROPOFOL N/A 11/18/2018   Procedure: COLONOSCOPY WITH PROPOFOL;  Surgeon: Pasty Spillers, MD;  Location: [facility] ENDOSCOPY;  Service: Gastroenterology;  Laterality: N/A;   COLONOSCOPY WITH PROPOFOL N/A 02/05/2023   Procedure: COLONOSCOPY WITH PROPOFOL;  Surgeon: Toney Reil, MD;  Location: [facility] ENDOSCOPY;  Service: Gastroenterology;  Laterality: N/A;   IR IMAGING GUIDED PORT INSERTION  03/24/2023   IR US LIVER BIOPSY  03/03/2023   LAPAROSCOPIC RETROPUBIC PROSTATECTOMY     2023   LUNG SURGERY     "ligation of thoracic duct"   PROSTATE BIOPSY N/A 09/21/2020   Procedure: PROSTATE BIOPSY [name];  Surgeon: [name], MD;  Location: [facility] ORS;  Service: Urology;  Laterality: N/A;   HPI:  Pt is a 62 y.o. male with medical history significant for Metastatic pancreatic Cancer on Palliative Chemotherapy, asthma, prostate Cancer, type 2 diabetes mellitus, hypertension, dyslipidemia, and PE on Eliquis, who presented to the emergency room with acute onset of dyspnea with associated cough productive of yellow sputum without wheezing.  Today patient was found to have hemoglobin 6.5.  He agreed for blood transfusion however during transfusion he developed worsening shortness of breath with tachycardia.  Transfusion was aborted and blood return to blood bank for transfusion reaction  workup.  Patient has also been seen by cardiologist and awaiting repeat echo before decision is made on Large pericardial effusion, lung metastasis per MD note.   Per Oncology note: Patient was hospitalized November 2024 with shortness of breath/acute PE, bilateral DVT.  CT of the abdomen and pelvis found to have a pancreatic head mass and liver masses.  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  Patient is status post 2 cycles of gemcitabine and Abraxane.  Now admitted to the hospital with pneumonia.   Palliative Care was consulted for GOC.    Chest CT: Large pericardial effusion increased compared to 02/27/2023.  Clinical correlation recommended to exclude tamponade physiology.  3. Increased bilateral centrilobular micro nodules and tree-in-bud  opacities with more confluence opacities in the right lung, likely  due to bronchopneumonia from aspiration. Lymphangitic spread of  tumor could appear similarly.  4. Irregular pleural thickening or loculated pleural effusion in the  lower right chest has increased compared to 02/27/2023. There is  associated pleural nodularity in the posterior lower lobe. This may be due to bronchopneumonia though metastases are not excluded.     Assessment / Plan / Recommendation  Clinical Impression   Pt seen for BSE today. Pt appeared weak/frail, resting in bed. Needed support for midline positioning. Pt seemed min distracted and needed verbal cues for follow through. Pt does have Baseline Pain and Pain meds ongoing per NSG. Palliative Care involved d/t pt's Baseline Cancer -- see PC note.  There is Baseline report of Poor oral intake at home  prior to admit. On  O2 2L; afebrile. WBC elevated.  Pt appears to present w/ primary pharyngeal phase dysphagia; functional oral phase swallowing and bolus management noted. Suspect potential sensorimotor deficits. Pt does have a Baseline of Phlegm which is noted w/ congested cough but  unable to expectorate(d/t weakness?).  Pt consumed po trials given w/ overt, clinical s/s of aspiration during po trials. Pt appears at risk for aspiration/aspiration pneumonia at this time. An objective swallow study was ordered for further assessment of pt's pharyngeal/pharyngoesophageal phase swallowing.  Pt does have challenging factors that could impact oropharyngeal swallowing to include deconditioning/weakness, Pain/pain meds per NSG, and impact from Chronic illness of Metastatic pancreatic Cancer on Palliative Chemotherapy. These factors can increase risk for aspiration, dysphagia as well as decreased oral intake overall.   During po trials, pt consumed consistencies (accepted) w/ overt coughing(mild, congested post swallow) - moreso w/ liquids(thin and Nectar). No overall decline in vocal quality nor change in respiratory presentation during/post trials. Noted decreased hyolaryngeal excursion during swallows. Oral phase appeared [facility] w/ timely bolus management and control of bolus propulsion for A-P transfer for swallowing. Oral clearing achieved w/ all trials/consistencies taken. Pt declined trials of soft solids, so not assessed.  OM Exam appeared to reveal overall generalized OM weakness; min dry oral cavity. No overt unilateral OM weakness noted. Speech Clear, low volume. Pt helped to feed self w/ setup support.   Recommend an objective swallow assessment in setting of suspected pharyngeal phase dysphagia. Post discussion w/ MD, pt can continue a more Mech Soft consistency diet (for well-Cut meats, moistened foods); Thin liquids w/ NO STRAWS -- carefully monitor toleration and pt should help to Hold Cup when drinking. Recommend aspiration precautions, reduce distractions at meals. Support upright sitting and feeding at meals. Stop giving po's if any increased coughing or decline in pulmonary status. Pills WHOLE vs CRUSHED in Puree for safer, easier swallowing.  Education given on Pills in Puree;  diet consistencies; aspiration precautions to pt and NSG -- precautions posted. Will await results of MBSS tomorrow for further POC. NSG updated, agreed. MD updated. Recommend Dietician f/u for support. Palliative Care is following now. SLP Visit Diagnosis: Dysphagia, pharyngeal phase (R13.13);Dysphagia, pharyngoesophageal phase (R13.14) (impact from Cancer; illness w/ overall weakness)    Aspiration Risk  Mild aspiration risk;Moderate aspiration risk;Risk for inadequate nutrition/hydration    Diet Recommendation   Thin;Dysphagia 3 (mechanical soft) (for now until MBSS) = a more Mech Soft consistency diet (for well-Cut meats, moistened foods); Thin liquids w/ NO STRAWS -- carefully monitor toleration and pt should help to Hold Cup when drinking. Recommend aspiration precautions, reduce distractions at meals. Support upright sitting and feeding at meals. Stop giving po's if any increased coughing or decline in pulmonary status.   Medication Administration: Whole meds with puree (vs Crushed in Puree)    Other  Recommendations Recommended Consults:  (Palliative Care; Dietician) Oral Care Recommendations: Oral care BID;Oral care before and after PO;Staff/trained caregiver to provide oral care (support) Caregiver Recommendations:  (TBD)    Recommendations for follow up therapy are one component of a multi-disciplinary discharge planning process, led by the attending physician.  Recommendations may be updated based on patient status, additional functional criteria and insurance authorization.  Follow up Recommendations  (TBD)      Assistance Recommended at Discharge  Intermittent-full  Functional Status Assessment Patient has had a recent decline in their functional status and/or demonstrates limited ability to make significant improvements in function in a reasonable and predictable  amount of time  Frequency and Duration min 2x/week  2 weeks       Prognosis Prognosis for improved oropharyngeal  function: Guarded Barriers to Reach Goals: Time post onset;Severity of deficits;Medication;Motivation Barriers/Prognosis Comment: Frail; Drowsy; Pain/pain meds; Cancer w/ Metastasis      Swallow Study   General Date of Onset: 03/31/23 HPI: Pt is a 62 y.o. male with medical history significant for Metastatic pancreatic Cancer on Palliative Chemotherapy, asthma, prostate Cancer, type 2 diabetes mellitus, hypertension, dyslipidemia, and PE on Eliquis, who presented to the emergency room with acute onset of dyspnea with associated cough productive of yellow sputum without wheezing.  Today patient was found to have hemoglobin 6.5.  He agreed for blood transfusion however during transfusion he developed worsening shortness of breath with tachycardia.  Transfusion was aborted and blood return to blood bank for transfusion reaction workup.  Patient has also been seen by cardiologist and awaiting repeat echo before decision is made on Large pericardial effusion, lung metastasis per MD note.  Per Oncology note: Patient was hospitalized November 2024 with shortness of breath/acute PE, bilateral DVT.  CT of the abdomen and pelvis found to have a pancreatic head mass and liver masses.  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  Patient is status post 2 cycles of gemcitabine and Abraxane.  Now admitted to the hospital with pneumonia.  Palliative Care was consulted for GOC.   Chest CT: Large pericardial effusion increased compared to 02/27/2023.  Clinical correlation recommended to exclude tamponade physiology.  3. Increased bilateral centrilobular micro nodules and tree-in-bud  opacities with more confluence opacities in the right lung, likely  due to bronchopneumonia from aspiration. Lymphangitic spread of  tumor could appear similarly.  4. Irregular pleural thickening or loculated pleural effusion in the  lower right chest has increased compared to 02/27/2023. There is   associated pleural nodularity in the posterior lower lobe. This may be due to bronchopneumonia though metastases are not excluded. Type of Study: Bedside Swallow Evaluation Previous Swallow Assessment: none Diet Prior to this Study: Regular;Thin liquids (Level 0) Temperature Spikes Noted: No (wbc 14.6) Respiratory Status: Nasal cannula (2L) History of Recent Intubation: No Behavior/Cognition: Alert;Cooperative;Pleasant mood;Distractible;Requires cueing (min Drowsy - pain/pain meds per NSG) Oral Cavity Assessment: Dry Oral Care Completed by SLP: Yes (attempted - pt does not open mouth widely) Oral Cavity - Dentition: Adequate natural dentition Vision: Functional for self-feeding Self-Feeding Abilities: Able to feed self;Needs assist;Needs set up Patient Positioning: Upright in bed (needed min support) Baseline Vocal Quality: Normal;Low vocal intensity (functional) Volitional Cough: Strong Volitional Swallow: Able to elicit    Oral/Motor/Sensory Function Overall Oral Motor/Sensory Function: Generalized oral weakness (reduced lingual extension and voluntary movements but no unilateral weakness noted) Facial ROM: Within Functional Limits Facial Symmetry: Within Functional Limits Lingual ROM:  (reduced overall) Lingual Symmetry: Within Functional Limits Lingual Strength: Reduced (suspected)   Ice Chips Ice chips: Within functional limits Presentation: Spoon (fed; 3 trials) Other Comments: min reduced OM bolus management w/ trials   Thin Liquid Thin Liquid: Impaired Presentation: Cup;Self Fed (9 trials) Oral Phase Impairments:  (wfl) Pharyngeal  Phase Impairments: Suspected delayed Swallow;Decreased hyoid-laryngeal movement;Cough - Delayed    Nectar Thick Nectar Thick Liquid: Impaired Presentation: Cup;Self Fed (4 trials) Oral Phase Impairments:  (wfl) Pharyngeal Phase Impairments: Suspected delayed Swallow;Decreased hyoid-laryngeal movement;Cough - Delayed   Honey Thick Honey Thick  Liquid: Not tested   Puree Puree: Impaired Presentation: Spoon (fed; 9 trials) Oral Phase Impairments:  (wfl) Pharyngeal  Phase Impairments: Suspected delayed Swallow;Decreased hyoid-laryngeal movement;Cough - Delayed (x1)   Solid     Solid: Not tested Other Comments: declined        Jerilynn Som, MS, CCC-SLP Speech Language Pathologist Rehab Services; [facility] [place] [phone] (ascom) Carolyna Yerian 04/03/2023,5:51 PM

## 2023-04-03 NOTE — Evaluation (Signed)
Occupational Therapy Evaluation Patient Details Name: Tony Benson MRN: [id number] DOB: [birth year] Today's Date: 04/03/2023   History of Present Illness 62 y.o. male with PMHx: metastatic pancreatic cancer on palliative chemotherapy, asthma, prostate cancer, DM2, HTN, dyslipidemia, and PE on Eliquis, who presented with acute onset of dyspnea with associated cough productive of yellow sputum without wheezing. MD assessment: Community-acquired pneumonia  Large pericardial effusion, Pleural effusion and lung metastasis. Hep drip started 12/18 afternoon.   Clinical Impression   Pt was seen for PT/OT evaluation this date to maximize pt/therapist safety. Prior to hospital admission, pt was living at home with his wife. Reports IND with mobility and ADLs. Daughter is a Engineer, civil (consulting) at [employer] and assists as needed,  Pt presents to acute OT demonstrating impaired ADL performance and functional mobility 2/2 weakness, decreased activity tolerance, mild balance deficits (See OT problem list for additional functional deficits). Pt currently requires SUP for supine to sit at EOB. Reports some pain to R shoulder, minimal and recently received pain meds for BLE pain from DVTs. He performed x2 STS from EOB with CGA x1 and took lateral steps to [facility] with CGA x2. HR ranging from 114-126 during session with nurse notified and no further mobility completed. Pt on RA on entry, but required 2L to be replaced d/t drop to 85% with standing and only improving to 87%. Pt with increasing lethargy during session becoming groggy from pain meds, required verb cues while standing to open eyes. Pt would benefit from skilled OT services to address noted impairments and functional limitations (see below for any additional details) in order to maximize safety and independence while minimizing falls risk and caregiver burden. Do anticipate/Anticipate the need for follow up OT services upon acute hospital DC.        If plan is discharge home,  recommend the following: A little help with walking and/or transfers;A little help with bathing/dressing/bathroom;Assistance with cooking/housework;Assist for transportation;Help with stairs or ramp for entrance    Functional Status Assessment  Patient has had a recent decline in their functional status and demonstrates the ability to make significant improvements in function in a reasonable and predictable amount of time.  Equipment Recommendations  BSC/3in1    Recommendations for Other Services       Precautions / Restrictions Precautions Precautions: Fall Restrictions Weight Bearing Restrictions Per Provider Order: No      Mobility Bed Mobility Overal bed mobility: Needs Assistance Bed Mobility: Supine to Sit, Sit to Supine     Supine to sit: Supervision, Used rails, HOB elevated Sit to supine: Min assist, Mod assist   General bed mobility comments: for BLE management to return to bed d/t increased lethargy/grogginess from pain meds    Transfers Overall transfer level: Needs assistance Equipment used: Rolling walker (2 wheels) Transfers: Sit to/from Stand Sit to Stand: Contact guard assist           General transfer comment: CGAx1 for STS from EOB for 2 trials and CGA x2 for lateral steps to [facility] d/t increased lethargy for safety      Balance Overall balance assessment: Needs assistance   Sitting balance-Leahy Scale: Good Sitting balance - Comments: steady seated at EOB     Standing balance-Leahy Scale: Fair Standing balance comment: CGA to maintain standing at EOB d/t increased lethargy                           ADL either performed or assessed with clinical judgement  ADL Overall ADL's : Needs assistance/impaired                                       General ADL Comments: pt likely to need Min A for LB ADLs, supervision for UB ADLs; too groggy from pain meds to assess     Vision         Perception         Praxis          Pertinent Vitals/Pain Pain Assessment Pain Assessment: Faces Faces Pain Scale: Hurts a little bit Pain Location: R shoulder; BLEs Pain Intervention(s): Monitored during session, Premedicated before session     Extremity/Trunk Assessment Upper Extremity Assessment Upper Extremity Assessment: Overall WFL for tasks assessed   Lower Extremity Assessment Lower Extremity Assessment: Generalized weakness       Communication Communication Communication: No apparent difficulties   Cognition Arousal: Lethargic Behavior During Therapy: Flat affect Overall Cognitive Status: Difficult to assess                                 General Comments: pt had been given pain meds and was groggy/lethargic throughout eval     General Comments  required replacement of 2L [place] to improve from 85% to 91%; HR ranging from 115-126 throughout session with only standing    Exercises Other Exercises Other Exercises: Edu on role of OT in acute setting and importance of therapy to maximize pt safety/IND.   Shoulder Instructions      Home Living Family/patient expects to be discharged to:: Private residence Living Arrangements: Spouse/significant other Available Help at Discharge: Family Type of Home: House Home Access: Level entry     Home Layout: Two level;Able to live on main level with bedroom/bathroom     Bathroom Shower/Tub: Walk-in shower         Home Equipment: Shower seat - built Char[employer] fundraiser (2 wheels)   Additional Comments: believes they have a RW      Prior Functioning/Environment Prior Level of Function : Independent/Modified Independent;Working/employed;Driving               ADLs Comments: working full time, active and independent        OT Problem List:        OT Treatment/Interventions:      OT Goals(Current goals can be found in the care plan section) Acute Rehab OT Goals Patient Stated Goal: return home, improve pain and  breathing OT Goal Formulation: With patient/family Time For Goal Achievement: 04/17/23 Potential to Achieve Goals: Fair ADL Goals Pt Will Perform Grooming: with set-up;standing Pt Will Perform Lower Body Bathing: with supervision;sitting/lateral leans;sit to/from stand Pt Will Perform Lower Body Dressing: with supervision;sitting/lateral leans;sit to/from stand Pt Will Transfer to Toilet: with supervision;ambulating;regular height toilet;grab bars Pt Will Perform Toileting - Clothing Manipulation and hygiene: with supervision;sit to/from stand;sitting/lateral leans  OT Frequency: Min 1X/week    Co-evaluation              AM-PAC OT "6 Clicks" Daily Activity     Outcome Measure Help from another person eating meals?: A Little Help from another person taking care of personal grooming?: A Little Help from another person toileting, which includes using toliet, bedpan, or urinal?: A Lot Help from another person bathing (including washing, rinsing, drying)?: A Lot Help from another person to  put on and taking off regular upper body clothing?: A Little Help from another person to put on and taking off regular lower body clothing?: A Lot 6 Click Score: 15   End of Session Equipment Utilized During Treatment: Rolling walker (2 wheels);Gait belt;Oxygen Nurse Communication: Mobility status  Activity Tolerance: Patient limited by lethargy Patient left: in bed;with call bell/phone within reach;with bed alarm set;with family/visitor present  OT Visit Diagnosis: Other abnormalities of gait and mobility (R26.89);Muscle weakness (generalized) (M62.81)                Time: 1610-9604 OT Time Calculation (min): 28 min Charges:  OT General Charges $OT Visit: 1 Visit OT Evaluation $OT Eval Moderate Complexity: 1 Mod Gillian Meeuwsen, OTR/L 04/03/23, 2:42 PM  Natalea Sutliff E Rocklin Soderquist 04/03/2023, 2:37 PM

## 2023-04-03 NOTE — Progress Notes (Signed)
Progress Note   Patient: Tony Benson:[id number] DOB: 01-20-1961 DOA: 03/31/2023     2 DOS: the patient was seen and examined on 04/03/2023    Brief hospital course: Tony Benson is a 62 y.o. male with medical history significant for metastatic pancreatic cancer on palliative chemotherapy, asthma, prostate cancer, type 2 diabetes mellitus, hypertension, dyslipidemia, and PE on Eliquis, who presented to the emergency room with acute onset of dyspnea with associated cough productive of yellow sputum without wheezing.  Today patient was found to have hemoglobin 6.5.  He agreed for blood transfusion however during transfusion he developed worsening shortness of breath with tachycardia.  Transfusion was aborted and blood return to blood bank for transfusion reaction workup. Patient has also been seen by cardiologist and awaiting repeat echo before decision is made on pericardial effusion.   Assessment and Plan:   Assessment and Plan: Community-acquired pneumonia Large pericardial effusion Pleural effusion and lung metastasis Continue ceftriaxone and azithromycin Continue mucolytic therapy  Plan of care discussed with cardiologist concerning pericardial effusion   Essential hypertension Continue antihypertensive therapy.     Acute anemia likely induced by chemo therapy Status post 1 unit blood transfusion Continue to monitor CBC closely   Type 2 diabetes mellitus without complications (HCC) Continue insulin therapy Monitor glucose closely Continue Jardiance.   History of pulmonary embolism as well as bilateral lower extremity DVT Patient initiated on heparin drip after discussion with cardiologist Takes Eliquis at home Consider switching heparin drip to Eliquis tomorrow   Pancreatic cancer metastasized to liver [facility]) - The patient is undergoing palliative chemotherapy. Continue as needed pain regimen   Dyslipidemia Continue statin therapy   Asthma, chronic - The  patient will be placed on as needed DuoNebs. - We will hold off long-acting beta agonist for now.       Subjective:  Patient seen and examined in the presence of daughter at bedside Denies worsening respiratory function Patient has been seen by PT OT today with recommendation for skilled nursing facility/short-term rehab   Physical Exam: GENERAL:  62 y.o.-year-old patient lying in the bed with no acute distress.  EYES: Pupils equal, round, reactive to light  HEENT: Head atraumatic, normocephalic. Oropharynx and nasopharynx clear.  NECK:  Supple, no jugular venous distention. No thyroid enlargement, no tenderness.  LUNGS: Normal breath sounds bilaterally CARDIOVASCULAR: Regular rate and rhythm ABDOMEN: Soft, nondistended, nontender.  EXTREMITIES: Bilateral lower extremity edema cyanosis, or clubbing.  NEUROLOGIC: Cranial nerves II through XII are intact.    Family Communication: Discussed with patient's wife   Disposition: Skilled nursing facility Status is: Inpatient   Time spent: 42 minutes   Data Reviewed:    Latest Ref Rng & Units 04/03/2023    5:34 AM 04/02/2023    5:47 AM 04/01/2023    5:47 AM  CBC  WBC 4.0 - 10.5 K/uL 14.6  14.8  9.9   Hemoglobin 13.0 - 17.0 g/dL 81.1  8.7  6.5   Hematocrit 39.0 - 52.0 % 32.5  28.0  21.8   Platelets 150 - 400 K/uL 223  170  161        Latest Ref Rng & Units 04/03/2023    5:34 AM 04/02/2023    5:47 AM 04/01/2023    5:47 AM  BMP  Glucose 70 - 99 mg/dL 914  782  956   BUN 8 - 23 mg/dL 15  20  29    Creatinine 0.61 - 1.24 mg/dL 2.13  0.86  5.78  Sodium 135 - 145 mmol/L 141  142  142   Potassium 3.5 - 5.1 mmol/L 4.3  4.3  4.4   Chloride 98 - 111 mmol/L 111  109  111   CO2 22 - 32 mmol/L 19  25  23    Calcium 8.9 - 10.3 mg/dL 9.1  8.8  8.8     Vitals:   04/03/23 0419 04/03/23 0727 04/03/23 0732 04/03/23 1538  BP: (!) 104/59  102/63 90/60  Pulse: (!) 109  (!) 110 (!) 109  Resp: 17     Temp: 98.1 F (36.7 C)  97.9 F (36.6  C) 99 F (37.2 C)  TempSrc:      SpO2: 94% 96% 98% 92%  Weight:      Height:         Author: Loyce Dys, MD 04/03/2023 4:51 PM  For on call review [email or web address].

## 2023-04-03 NOTE — Consult Note (Signed)
PHARMACY - ANTICOAGULATION CONSULT NOTE  Pharmacy Consult for Heparin  Indication: DVT  Allergies  Allergen Reactions   Other Anaphylaxis    peanuts   Morphine And Codeine Itching   Tree Extract Other (See Comments)    ALMONDS  ---- EYE/ FACIAL SWELLING   Wound Dressing Adhesive Rash   Patient Measurements: Height: 5\' 10"  (177.8 cm) Weight: 67.6 kg (149 lb 0.5 oz) IBW/kg (Calculated) : 73 Heparin Dosing Weight: 67.6 kg   Vital Signs: Temp: 98.1 F (36.7 C) (12/19 0156) BP: 110/59 (12/19 0156) Pulse Rate: 100 (12/19 0156)  Labs: Recent Labs    03/31/23 2200 04/01/23 0353 04/01/23 0547 04/02/23 0547 04/02/23 1735 04/03/23 0015  HGB 7.5*  --  6.5* 8.7*  --   --   HCT 25.3*  --  21.8* 28.0*  --   --   PLT 201  --  161 170  --   --   APTT  --   --   --   --  50* 61*  LABPROT  --   --   --   --  19.2*  --   INR  --   --   --   --  1.6*  --   HEPARINUNFRC  --   --   --   --  0.89* 0.82*  CREATININE 0.76  --  0.74 0.57*  --   --   TROPONINIHS 19* 38*  --   --   --   --    Estimated Creatinine Clearance: 91.5 mL/min (A) (by C-G formula based on SCr of 0.57 mg/dL (L)).  Medical History: Past Medical History:  Diagnosis Date   Asthma    Cancer (HCC)    prostate   Diabetes mellitus without complication (HCC)    type II   Hyperlipidemia    Hypertension    Sleep apnea    uses Cpap machine    Medications:  On PTA apixaban for VTE treatment - last dose taken 12/17 @ 0348   Assessment: Tony Benson is a 62 year old male with history of small nonocclusive segmental/subsegmental pulmonary emboli in the left upper lobe in November 2024 for which he was started on apixaban. Also found to have bilateral lower extremity DVTs extending into the right femoral through the calf veins and left popliteal through the calf veins. Last dose of apixaban was taken 12/17 @ 0348. Patient presented with hemoglobin or 6.5 so apixaban was held and patient received blood transfusions. Pharmacy  has been consulted for initiation and management of a heparin infusion. Baseline labs: Hgb 8.7, PLT 170, aPTT, PT/INR, and HL ordered.   Goal of Therapy:  Heparin level 0.3-0.7 units/ml aPTT 66-102 seconds Monitor platelets by anticoagulation protocol: Yes   Plan:  12/19 @ 0015:  aPTT = 61,  HL = 0.82 - aPTT is SUBtherapeutic,  HL elevated from recent Eliquis - Will order heparin 1000 units IV X 1 bolus and increase drip rate to 1250 units/hr - Will recheck aPTT 6 hrs after rate change - Will recheck HL on 12/20 with AM labs  Will monitor with aPTT levels until correlation with HL   Monitor CBC daily while on heparin   Tony Benson D, PharmD 04/03/2023 2:14 AM

## 2023-04-03 NOTE — Consult Note (Signed)
PHARMACY - ANTICOAGULATION CONSULT NOTE  Pharmacy Consult for Heparin infusion Indication: DVT  Allergies  Allergen Reactions   Other Anaphylaxis    peanuts   Morphine And Codeine Itching   Tree Extract Other (See Comments)    ALMONDS  ---- EYE/ FACIAL SWELLING   Wound Dressing Adhesive Rash   Patient Measurements: Height: 5\' 10"  (177.8 cm) Weight: 67.6 kg (149 lb 0.5 oz) IBW/kg (Calculated) : 73 Heparin Dosing Weight: 67.6 kg   Vital Signs: Temp: 99 F (37.2 C) (12/19 1538) BP: 90/60 (12/19 1538) Pulse Rate: 109 (12/19 1538)  Labs: Recent Labs    03/31/23 2200 04/01/23 0353 04/01/23 0547 04/02/23 0547 04/02/23 1735 04/02/23 1735 04/03/23 0015 04/03/23 0534 04/03/23 0838 04/03/23 1518  HGB 7.5*  --  6.5* 8.7*  --   --   --  10.2*  --   --   HCT 25.3*  --  21.8* 28.0*  --   --   --  32.5*  --   --   PLT 201  --  161 170  --   --   --  223  --   --   APTT  --   --   --   --  50*   < > 61*  --  100* 83*  LABPROT  --   --   --   --  19.2*  --   --   --   --   --   INR  --   --   --   --  1.6*  --   --   --   --   --   HEPARINUNFRC  --   --   --   --  0.89*  --  0.82*  --   --   --   CREATININE 0.76  --  0.74 0.57*  --   --   --  0.66  --   --   TROPONINIHS 19* 38*  --   --   --   --   --   --   --   --    < > = values in this interval not displayed.   Estimated Creatinine Clearance: 91.5 mL/min (by C-G formula based on SCr of 0.66 mg/dL).  Medications:  On PTA apixaban for VTE treatment - last dose taken 12/17 @ 0348   Assessment: Riot Sipp is a 62 year old male with history of small nonocclusive segmental/subsegmental pulmonary emboli in the left upper lobe in November 2024 for which he was started on apixaban. Also found to have bilateral lower extremity DVTs extending into the right femoral through the calf veins and left popliteal through the calf veins.  Patient presented with hemoglobin or 6.5 so apixaban was held and patient received blood transfusions.  Pharmacy has been consulted for initiation and management of a heparin infusion.   Baseline labs: Hgb 8.7, PLT 170, aPTT, PT/INR, and HL ordered.   Acute DVT in November, Apixaban 10mg  BID x 1 week completed. Taking apixaban 5mg  po BID PTA. Last dose of apixaban was taken 12/17 @ 0348.  Goal of Therapy:  Heparin level 0.3-0.7 units/ml aPTT 66-102 seconds Monitor platelets by anticoagulation protocol: Yes   Date Time Results Comments  12/18 1810  Heparin infusion started at 1150 un/hr, no bolus  12/19 0015 aPTT=61 HL=0.82 Bolus plus rate change 1250 un/hr  12/19 0838 aPTT=100 Therapeutic x 1  12/19 1518 aPTT= 83 Therapeutic x 2    Plan:  aPTT therapeutic x 2  Continue heparin infusion at 1250 units/hr  Check next aPTT level with AM labs along with HL Monitor with aPTT levels until correlation with HL   Monitor CBC daily while on heparin   [name], PharmD Pharmacy Resident  04/03/2023 4:23 PM

## 2023-04-04 ENCOUNTER — Inpatient Hospital Stay: Payer: BC Managed Care – PPO

## 2023-04-04 DIAGNOSIS — I3139 Other pericardial effusion (noninflammatory): Secondary | ICD-10-CM | POA: Diagnosis not present

## 2023-04-04 DIAGNOSIS — C259 Malignant neoplasm of pancreas, unspecified: Secondary | ICD-10-CM | POA: Diagnosis not present

## 2023-04-04 DIAGNOSIS — R0600 Dyspnea, unspecified: Secondary | ICD-10-CM | POA: Diagnosis not present

## 2023-04-04 DIAGNOSIS — C787 Secondary malignant neoplasm of liver and intrahepatic bile duct: Secondary | ICD-10-CM | POA: Diagnosis not present

## 2023-04-04 LAB — CBC WITH DIFFERENTIAL/PLATELET
Abs Immature Granulocytes: 0.11 10*3/uL — ABNORMAL HIGH (ref 0.00–0.07)
Basophils Absolute: 0 10*3/uL (ref 0.0–0.1)
Basophils Relative: 0 %
Eosinophils Absolute: 0.4 10*3/uL (ref 0.0–0.5)
Eosinophils Relative: 4 %
HCT: 27.6 % — ABNORMAL LOW (ref 39.0–52.0)
Hemoglobin: 8.5 g/dL — ABNORMAL LOW (ref 13.0–17.0)
Immature Granulocytes: 1 %
Lymphocytes Relative: 2 %
Lymphs Abs: 0.2 10*3/uL — ABNORMAL LOW (ref 0.7–4.0)
MCH: 25.8 pg — ABNORMAL LOW (ref 26.0–34.0)
MCHC: 30.8 g/dL (ref 30.0–36.0)
MCV: 83.9 fL (ref 80.0–100.0)
Monocytes Absolute: 0.9 10*3/uL (ref 0.1–1.0)
Monocytes Relative: 9 %
Neutro Abs: 8.2 10*3/uL — ABNORMAL HIGH (ref 1.7–7.7)
Neutrophils Relative %: 84 %
Platelets: 210 10*3/uL (ref 150–400)
RBC: 3.29 MIL/uL — ABNORMAL LOW (ref 4.22–5.81)
RDW: 17.8 % — ABNORMAL HIGH (ref 11.5–15.5)
WBC: 9.8 10*3/uL (ref 4.0–10.5)
nRBC: 0.3 % — ABNORMAL HIGH (ref 0.0–0.2)

## 2023-04-04 LAB — GLUCOSE, CAPILLARY
Glucose-Capillary: 267 mg/dL — ABNORMAL HIGH (ref 70–99)
Glucose-Capillary: 151 mg/dL — ABNORMAL HIGH (ref 70–99)
Glucose-Capillary: 65 mg/dL — ABNORMAL LOW (ref 70–99)
Glucose-Capillary: 134 mg/dL — ABNORMAL HIGH (ref 70–99)

## 2023-04-04 LAB — BASIC METABOLIC PANEL
Anion gap: 8 (ref 5–15)
BUN: 14 mg/dL (ref 8–23)
CO2: 23 mmol/L (ref 22–32)
Calcium: 8.6 mg/dL — ABNORMAL LOW (ref 8.9–10.3)
Chloride: 111 mmol/L (ref 98–111)
Creatinine, Ser: 0.47 mg/dL — ABNORMAL LOW (ref 0.61–1.24)
GFR, Estimated: >60 mL/min (ref >60)
Glucose, Bld: 152 mg/dL — ABNORMAL HIGH (ref 70–99)
Potassium: 3.7 mmol/L (ref 3.5–5.1)
Sodium: 142 mmol/L (ref 135–145)

## 2023-04-04 LAB — APTT: aPTT: 74 s — ABNORMAL HIGH (ref 24–36)

## 2023-04-04 LAB — HEPARIN LEVEL (UNFRACTIONATED): Heparin Unfractionated: 0.29 [IU]/mL — ABNORMAL LOW (ref 0.30–0.70)

## 2023-04-04 MED ORDER — HEPARIN (PORCINE) 25000 UT/250ML-% IV SOLN: 1350.0000 [IU]/h | INTRAVENOUS | Status: DC

## 2023-04-04 MED ORDER — ENOXAPARIN SODIUM 80 MG/0.8ML IJ SOSY
70.0000 mg | PREFILLED_SYRINGE | Freq: Two times a day (BID) | INTRAMUSCULAR | Status: DC
  Administered 2023-04-04: 70 mg via SUBCUTANEOUS
  Filled 2023-04-04: qty 0.7

## 2023-04-04 MED ORDER — SENNOSIDES-DOCUSATE SODIUM 8.6-50 MG PO TABS: 1.0000 | ORAL_TABLET | Freq: Every evening | ORAL | Status: DC | PRN

## 2023-04-04 MED ORDER — METOPROLOL TARTRATE 5 MG/5ML IV SOLN
5.0000 mg | INTRAVENOUS | Status: DC | PRN
Start: 2023-04-04 — End: 2023-04-05

## 2023-04-04 MED ORDER — ENOXAPARIN SODIUM 80 MG/0.8ML IJ SOSY
1.0000 mg/kg | PREFILLED_SYRINGE | Freq: Two times a day (BID) | INTRAMUSCULAR | Status: DC
  Administered 2023-04-04 – 2023-04-05 (×2): 67.5 mg via SUBCUTANEOUS
  Filled 2023-04-04 (×2): qty 0.68

## 2023-04-04 MED ORDER — GLUCAGON HCL RDNA (DIAGNOSTIC) 1 MG IJ SOLR: 1.0000 mg | INTRAMUSCULAR | Status: DC | PRN

## 2023-04-04 MED ORDER — DOCUSATE SODIUM 50 MG/5ML PO LIQD
50.0000 mg | Freq: Two times a day (BID) | ORAL | Status: DC
  Filled 2023-04-04: qty 10

## 2023-04-04 MED ORDER — SENNOSIDES-DOCUSATE SODIUM 8.6-50 MG PO TABS
1.0000 | ORAL_TABLET | Freq: Two times a day (BID) | ORAL | Status: DC
  Administered 2023-04-04 – 2023-04-05 (×2): 1 via ORAL
  Filled 2023-04-04 (×2): qty 1

## 2023-04-04 MED ORDER — SENNA 8.6 MG PO TABS: 1.0000 | ORAL_TABLET | Freq: Two times a day (BID) | ORAL | Status: DC

## 2023-04-04 MED ORDER — HEPARIN BOLUS VIA INFUSION
1000.0000 [IU] | Freq: Once | INTRAVENOUS | Status: AC
  Administered 2023-04-04: 1000 [IU] via INTRAVENOUS
  Filled 2023-04-04: qty 1000

## 2023-04-04 MED ORDER — HYDRALAZINE HCL 20 MG/ML IJ SOLN: 10.0000 mg | INTRAMUSCULAR | Status: DC | PRN

## 2023-04-04 MED ORDER — IPRATROPIUM-ALBUTEROL 0.5-2.5 (3) MG/3ML IN SOLN: 3.0000 mL | RESPIRATORY_TRACT | Status: DC | PRN

## 2023-04-04 MED ORDER — DEXTROSE 50 % IV SOLN
12.5000 g | Freq: Once | INTRAVENOUS | Status: AC
  Administered 2023-04-04: 12.5 g via INTRAVENOUS
  Filled 2023-04-04: qty 50

## 2023-04-04 NOTE — Progress Notes (Signed)
Speech Language Pathology Treatment: Dysphagia  Patient Details Name: Tony Benson MRN: [id number] DOB: June 17, 1960 Today's Date: 04/04/2023 Time: 1430-1450 SLP Time Calculation (min) (ACUTE ONLY): 20 min  Assessment / Plan / Recommendation Clinical Impression  This writer met with pt and his daughter at bedside to review the results of pt's Modified Barium Swallow Study. Video playback was used to illustrate severity of impairment and gross aspiration. This Clinical research associate reviewed curent recommendation of NPO d/t high risk of aspiration. Recommend frequent oral care to reduce bacterial load of pt's own salvia as he is at risk of aspirating it.   All questions were answered to this writer's ability. Secure chat sent to pt's current medical team (Palliative Care NP, attending, nurse). ST services will sign off at this time.    HPI HPI: Pt is a 62 y.o. male with medical history significant for Metastatic pancreatic Cancer on Palliative Chemotherapy, asthma, prostate Cancer, type 2 diabetes mellitus, hypertension, dyslipidemia, and PE on Eliquis, who presented to the emergency room with acute onset of dyspnea with associated cough productive of yellow sputum without wheezing.  Today patient was found to have hemoglobin 6.5.  He agreed for blood transfusion however during transfusion he developed worsening shortness of breath with tachycardia.  Transfusion was aborted and blood return to blood bank for transfusion reaction workup.  Patient has also been seen by cardiologist and awaiting repeat echo before decision is made on Large pericardial effusion, lung metastasis per MD note.  Per Oncology note: Patient was hospitalized November 2024 with shortness of breath/acute PE, bilateral DVT.  CT of the abdomen and pelvis found to have a pancreatic head mass and liver masses.  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  Patient is status post 2 cycles of gemcitabine and  Abraxane.  Now admitted to the hospital with pneumonia.  Palliative Care was consulted for GOC.   Chest CT: Large pericardial effusion increased compared to 02/27/2023.  Clinical correlation recommended to exclude tamponade physiology.  3. Increased bilateral centrilobular micro nodules and tree-in-bud  opacities with more confluence opacities in the right lung, likely  due to bronchopneumonia from aspiration. Lymphangitic spread of  tumor could appear similarly.  4. Irregular pleural thickening or loculated pleural effusion in the  lower right chest has increased compared to 02/27/2023. There is  associated pleural nodularity in the posterior lower lobe. This may be due to bronchopneumonia though metastases are not excluded.      SLP Plan  Discharge SLP treatment due to (comment) (severity of current condition, poor prognosis)      Recommendations for follow up therapy are one component of a multi-disciplinary discharge planning process, led by the attending physician.  Recommendations may be updated based on patient status, additional functional criteria and insurance authorization.    Recommendations  Diet recommendations: NPO Medication Administration: Via alternative means                  Oral care QID    (Palliative care consult) Dysphagia, oropharyngeal phase (R13.12)     Discharge SLP treatment due to (comment) (severity of current condition, poor prognosis)    Patriciann Becht B. Dreama Saa, M.S., CCC-SLP, Tree surgeon Certified Brain Injury Specialist [facility]  [facility] Services Office [phone] Ascom [phone] Fax [phone]

## 2023-04-04 NOTE — Procedures (Addendum)
Modified Barium Swallow Study  Patient Details  Name: Tony Benson MRN: [id number] Date of Birth: March 01, 1961  Today's Date: 04/04/2023  Modified Barium Swallow completed.  Full report located under Chart Review in the Imaging Section.  History of Present Illness Pt is a 62 y.o. male with medical history significant for Metastatic pancreatic Cancer on Palliative Chemotherapy, asthma, prostate Cancer, type 2 diabetes mellitus, hypertension, dyslipidemia, and PE on Eliquis, who presented to the emergency room with acute onset of dyspnea with associated cough productive of yellow sputum without wheezing.  Today patient was found to have hemoglobin 6.5.  He agreed for blood transfusion however during transfusion he developed worsening shortness of breath with tachycardia.  Transfusion was aborted and blood return to blood bank for transfusion reaction workup.  Patient has also been seen by cardiologist and awaiting repeat echo before decision is made on Large pericardial effusion, lung metastasis per MD note.  Per Oncology note: Patient was hospitalized November 2024 with shortness of breath/acute PE, bilateral DVT.  CT of the abdomen and pelvis found to have a pancreatic head mass and liver masses.  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  Patient is status post 2 cycles of gemcitabine and Abraxane.  Now admitted to the hospital with pneumonia.  Palliative Care was consulted for GOC.   Chest CT: Large pericardial effusion increased compared to 02/27/2023.  Clinical correlation recommended to exclude tamponade physiology.  3. Increased bilateral centrilobular micro nodules and tree-in-bud  opacities with more confluence opacities in the right lung, likely  due to bronchopneumonia from aspiration. Lymphangitic spread of  tumor could appear similarly.  4. Irregular pleural thickening or loculated pleural effusion in the  lower right chest has increased compared to  02/27/2023. There is  associated pleural nodularity in the posterior lower lobe. This may be due to bronchopneumonia though metastases are not excluded.   Clinical Impression Pt presents with profound oropharyngeal dysphagia that is likely related to overall deconditioned state. As such he is at a very high risk of aspiration, malnutrition and dehydration when consuming POs. Specifically, when consuming thin liquids via spoon, nectar thick liquids via spoon and small cup sips as well as small boluses of puree, he presents with sensed and silent aspiration of all consistencies. When aspiration is sensed, he produced a delayed weak non-productive cough that was not effective in clearing aspirates or laryngeal penetrates. As such, would recommend NPO and discussion regarding GOC. Recommend extensive oral care to reduce the bacterial load of pt's salvia as his risk of aspirating his own salvia is high.       Analysis of swallow function:  Oral phase is c/b decrease propulsion of boluses over base of tongue  Pharyngeal phase is c/b decreased base of tongue strength, weak hyoid movement, decreased epiglottic deflection, delayed swallow initiation to leave of pyriform sinuses and weak pharyngeal parastasis resulting in reduced UES opening and passage of boluses as well s gross widespread pharyngeal residue throughout pharynx. Boluses were observed to be resting within the laryngeal vestibule and on pt's vocal cords with no ability to clear these penetrates even with a cued cough. Aspiration was gross when consuming thin liquids via spoon d/t delayed pharyngeal swallow. While less of the nectar and puree were aspirated, pharyngeal residue was greater resulting in continued build-up of penetrates.  Factors that may increase risk of adverse event in presence of aspiration [employer]oot[employer] 2021): Poor general health and/or compromised immunity;Reduced cognitive function;Limited mobility;Frail or  deconditioned;Dependence for feeding and/or oral hygiene;Inadequate oral hygiene;Reduced saliva;Weak cough;Frequent aspiration of large volumes  Swallow Evaluation Recommendations Recommendations: NPO Medication Administration: Via alternative means Oral care recommendations: Oral care QID (4x/day) Recommended consults: Consider Palliative care    Tony Benson, M.S., CCC-SLP, Tree surgeon Certified Brain Injury Specialist [facility]  [facility] Rehabilitation Services Office [phone] Ascom [phone] Fax [phone]

## 2023-04-04 NOTE — Consult Note (Signed)
PHARMACY - ANTICOAGULATION CONSULT NOTE  Pharmacy Consult for Heparin infusion Indication: DVT  Allergies  Allergen Reactions   Other Anaphylaxis    peanuts   Morphine And Codeine Itching   Tree Extract Other (See Comments)    ALMONDS  ---- EYE/ FACIAL SWELLING   Wound Dressing Adhesive Rash   Patient Measurements: Height: 5\' 10"  (177.8 cm) Weight: 67.6 kg (149 lb 0.5 oz) IBW/kg (Calculated) : 73 Heparin Dosing Weight: 67.6 kg   Vital Signs: Temp: 100 F (37.8 C) (12/20 1651) BP: 107/60 (12/20 1651) Pulse Rate: 101 (12/20 1651)  Labs: Recent Labs    04/02/23 0547 04/02/23 1735 04/02/23 1735 04/03/23 0015 04/03/23 0534 04/03/23 0838 04/03/23 1518 04/04/23 0531  HGB 8.7*  --   --   --  10.2*  --   --  8.5*  HCT 28.0*  --   --   --  32.5*  --   --  27.6*  PLT 170  --   --   --  223  --   --  210  APTT  --  50*   < > 61*  --  100* 83* 74*  LABPROT  --  19.2*  --   --   --   --   --   --   INR  --  1.6*  --   --   --   --   --   --   HEPARINUNFRC  --  0.89*  --  0.82*  --   --   --  0.29*  CREATININE 0.57*  --   --   --  0.66  --   --  0.47*   < > = values in this interval not displayed.   Estimated Creatinine Clearance: 91.5 mL/min (A) (by C-G formula based on SCr of 0.47 mg/dL (L)).  Medications:  On PTA apixaban for VTE treatment - last dose taken 12/17 @ 0348   Assessment: Tony Benson is a 62 year old male with history of small nonocclusive segmental/subsegmental pulmonary emboli in the left upper lobe in November 2024 for which he was started on apixaban. Also found to have bilateral lower extremity DVTs extending into the right femoral through the calf veins and left popliteal through the calf veins.  Patient presented with hemoglobin or 6.5 so apixaban was held and patient received blood transfusions. Pharmacy has been consulted for initiation and management of a heparin infusion.   Baseline labs: Hgb 8.7, PLT 170, aPTT, PT/INR, and HL ordered.   Acute DVT  in November, Apixaban 10mg  BID x 1 week completed. Taking apixaban 5mg  po BID PTA. Last dose of apixaban was taken 12/17 @ 0348.  Goal of Therapy:  Monitor platelets by anticoagulation protocol: Yes   Plan:  Per MD, patient and family prefer to be on lovenox. Discontinue heparin infusion Start lovenox 67.5 mg BID (1 mg/kg BID) at 2200 tonight Monitor CBC and signs/symptoms of bleeding  Thank you for involving pharmacy in this patient's care.   Rockwell Alexandria, PharmD Clinical Pharmacist 04/04/2023 5:14 PM

## 2023-04-04 NOTE — Progress Notes (Signed)
PT Cancellation Note  Patient Details Name: Tony Benson MRN: [id number] DOB: [birth year]   Cancelled Treatment:    Reason Eval/Treat Not Completed: Patient at procedure or test/unavailable, will attempt to see pt at a future date/time as medically appropriate.     Ovidio Hanger PT, DPT 04/04/23, 2:17 PM

## 2023-04-04 NOTE — Progress Notes (Signed)
PROGRESS NOTE    Tony Benson  LOV:[id number] DOB: [birth year] DOA: 03/31/2023 PCP: Sherrie Mustache, MD    Brief Narrative:  62 y.o. male with medical history significant for metastatic pancreatic cancer on palliative chemotherapy, asthma, prostate cancer, type 2 diabetes mellitus, hypertension, dyslipidemia, and PE on Eliquis, who presented to the emergency room with acute onset of dyspnea with associated cough productive of yellow sputum without wheezing.  Today patient was found to have hemoglobin 6.5.  He agreed for blood transfusion however during transfusion he developed worsening shortness of breath with tachycardia.  Transfusion was aborted and blood return to blood bank for transfusion reaction workup. Patient has also been seen by cardiologist and awaiting repeat echo before decision is made on pericardial effusion.   Assessment & Plan:  Principal Problem:   Dyspnea Active Problems:   Malignant pericardial effusion   Essential hypertension   Asthma, chronic   Dyslipidemia   Pancreatic cancer metastasized to liver [facility])   History of pulmonary embolism   Type 2 diabetes mellitus without complications (HCC)   Anemia   Hemoptysis   Palliative care encounter   Pressure injury of skin   Community-acquired pneumonia Large pericardial effusion Pleural effusion and lung metastasis On Rocephin and azithromycin.  Supportive care.  Bronchodilators.  I-S/flutter valve. -CTA shows pneumonia but there is currently concerns of lymphangitic spread of his cancer. -At this time cardiology is not recommending pericardiocentesis as there is no evidence of decompensation or hemodynamic instability.  Dysphagia - Mild aspiration risk.  Seen by speech and swallow recommending dysphagia 3 diet.   Essential hypertension Norvasc, Lopressor.  IV as needed.    Acute anemia likely induced by chemo therapy Status post 1 unit blood transfusion.  Hemoglobin 8.5 Continue to monitor CBC closely    Type 2 diabetes mellitus without complications (HCC) Sliding scale and Accu-Chek.  Continue Jardiance 25 mg daily   History of pulmonary embolism as well as bilateral lower extremity DVT At home was on Eliquis, transition heparin drip to Lovenox.   Pancreatic cancer metastasized to liver [facility]) Follows outpatient oncology on outpatient chemotherapy   Dyslipidemia Continue statin   Asthma, chronic As needed bronchodilators  In my opinion overall may have poor prognosis given advanced and aggressive malignancy.  Patient understands this and currently wishes to be full code.  Medically DNR/DNI is recommended.  PT/OT-SNF; will consult TOC  DVT prophylaxis: Heparin drip > Lovenox Code Status: Full code Family Communication: Daughter at bedside Status is: Inpatient Remains inpatient appropriate because: Continue hospital stay at this time multiple ongoing issues.    Subjective: Seen at bedside, overall still feels weak.  No new complaints.  Poor appetite.   Examination:  General exam: Appears calm and comfortable  Respiratory system: Clear to auscultation. Respiratory effort normal. Cardiovascular system: S1 & S2 heard, RRR. No JVD, murmurs, rubs, gallops or clicks. No pedal edema. Gastrointestinal system: Abdomen is nondistended, soft and nontender. No organomegaly or masses felt. Normal bowel sounds heard. Central nervous system: Alert and oriented. No focal neurological deficits. Extremities: Symmetric 5 x 5 power. Skin: No rashes, lesions or ulcers Psychiatry: Judgement and insight appear normal. Mood & affect appropriate. Stage II coccyx wound, POA           Pressure Injury 04/02/23 Coccyx Medial Stage 2 -  Partial thickness loss of dermis presenting as a shallow open injury with a red, pink wound bed without slough. (Active)  04/02/23 1756  Location: Coccyx  Location Orientation: Medial  Staging: Stage  2 -  Partial thickness loss of dermis presenting as a  shallow open injury with a red, pink wound bed without slough.  Wound Description (Comments):   Present on Admission: Yes     Diet Orders (From admission, onward)     Start     Ordered   04/03/23 1644  DIET DYS 3 Room service appropriate? Yes with Assist; Fluid consistency: Thin  Diet effective now       Comments: Extra Gravy on Chopped meats, potatoes.  May have baked/sweet potatoes per Speech ok, butters.  NO STRAWS!!!!  Question Answer Comment  Room service appropriate? Yes with Assist   Fluid consistency: Thin      04/03/23 1644            Objective: Vitals:   04/04/23 0730 04/04/23 0838 04/04/23 0922 04/04/23 0923  BP:  (!) 107/54 (!) 107/54 (!) 107/54  Pulse:  (!) 109 (!) 109   Resp:  16    Temp:  97.7 F (36.5 C)    TempSrc:      SpO2: 99% 92%    Weight:      Height:        Intake/Output Summary (Last 24 hours) at 04/04/2023 1158 Last data filed at 04/04/2023 6578 Gross per 24 hour  Intake 1037.82 ml  Output 1000 ml  Net 37.82 ml   Filed Weights   03/31/23 1910  Weight: 67.6 kg    Scheduled Meds:  amLODipine  10 mg Oral Daily   atorvastatin  10 mg Oral Daily   Chlorhexidine Gluconate Cloth  6 each Topical Daily   empagliflozin  25 mg Oral Daily   enoxaparin (LOVENOX) injection  70 mg Subcutaneous BID   feeding supplement  237 mL Oral BID BM   fentaNYL  1 patch Transdermal Q72H   guaiFENesin  15 mL Oral Q6H   insulin aspart  0-15 Units Subcutaneous TID AC & HS   ipratropium-albuterol  3 mL Nebulization BID   metoprolol tartrate  50 mg Oral BID   OLANZapine  5 mg Oral QHS   omega-3 acid ethyl esters  1 g Oral Daily   senna-docusate  1 tablet Oral BID   Continuous Infusions:  azithromycin 500 mg (04/04/23 0610)   cefTRIAXone (ROCEPHIN)  IV 2 g (04/04/23 0513)    Nutritional status     Body mass index is 21.38 kg/m.  Data Reviewed:   CBC: Recent Labs  Lab 03/31/23 2200 04/01/23 0547 04/02/23 0547 04/03/23 0534 04/04/23 0531   WBC 8.7 9.9 14.8* 14.6* 9.8  NEUTROABS 8.2*  --  13.5* 12.9* 8.2*  HGB 7.5* 6.5* 8.7* 10.2* 8.5*  HCT 25.3* 21.8* 28.0* 32.5* 27.6*  MCV 87.8 88.6 83.6 81.3 83.9  PLT 201 161 170 223 210   Basic Metabolic Panel: Recent Labs  Lab 03/31/23 2200 04/01/23 0547 04/02/23 0547 04/03/23 0534 04/04/23 0531  NA 139 142 142 141 142  K 4.5 4.4 4.3 4.3 3.7  CL 109 111 109 111 111  CO2 19* 23 25 19* 23  GLUCOSE 198* 167* 193* 180* 152*  BUN 31* 29* 20 15 14   CREATININE 0.76 0.74 0.57* 0.66 0.47*  CALCIUM 9.0 8.8* 8.8* 9.1 8.6*   GFR: Estimated Creatinine Clearance: 91.5 mL/min (A) (by C-G formula based on SCr of 0.47 mg/dL (L)). Liver Function Tests: Recent Labs  Lab 03/31/23 2200  AST 23  ALT 28  ALKPHOS 670*  BILITOT 1.2*  PROT 6.5  ALBUMIN 2.1*   No results  for input(s): "LIPASE", "AMYLASE" in the last 168 hours. No results for input(s): "AMMONIA" in the last 168 hours. Coagulation Profile: Recent Labs  Lab 04/02/23 1735  INR 1.6*   Cardiac Enzymes: No results for input(s): "CKTOTAL", "CKMB", "CKMBINDEX", "TROPONINI" in the last 168 hours. BNP (last 3 results) No results for input(s): "PROBNP" in the last 8760 hours. HbA1C: No results for input(s): "HGBA1C" in the last 72 hours. CBG: Recent Labs  Lab 04/03/23 0910 04/03/23 1202 04/03/23 1611 04/03/23 2143 04/04/23 0840  GLUCAP 176* 231* 131* 147* 267*   Lipid Profile: No results for input(s): "CHOL", "HDL", "LDLCALC", "TRIG", "CHOLHDL", "LDLDIRECT" in the last 72 hours. Thyroid Function Tests: No results for input(s): "TSH", "T4TOTAL", "FREET4", "T3FREE", "THYROIDAB" in the last 72 hours. Anemia Panel: No results for input(s): "VITAMINB12", "FOLATE", "FERRITIN", "TIBC", "IRON", "RETICCTPCT" in the last 72 hours. Sepsis Labs: No results for input(s): "PROCALCITON", "LATICACIDVEN" in the last 168 hours.  Recent Results (from the past 240 hours)  Resp panel by RT-PCR (RSV, Flu A&B, Covid) Anterior Nasal Swab      Status: None   Collection Time: 03/31/23  7:13 PM   Specimen: Anterior Nasal Swab  Result Value Ref Range Status   SARS Coronavirus 2 by RT PCR NEGATIVE NEGATIVE Final    Comment: (NOTE) SARS-CoV-2 target nucleic acids are NOT DETECTED.  The SARS-CoV-2 RNA is generally detectable in upper respiratory specimens during the acute phase of infection. The lowest concentration of SARS-CoV-2 viral copies this assay can detect is 138 copies/mL. A negative result does not preclude SARS-Cov-2 infection and should not be used as the sole basis for treatment or other patient management decisions. A negative result may occur with  improper specimen collection/handling, submission of specimen other than nasopharyngeal swab, presence of viral mutation(s) within the areas targeted by this assay, and inadequate number of viral copies(<138 copies/mL). A negative result must be combined with clinical observations, patient history, and epidemiological information. The expected result is Negative.  Fact Sheet for Patients:  BloggerCourse.com  Fact Sheet for Healthcare Providers:  SeriousBroker.it  This test is no t yet approved or cleared by the [place] FDA and  has been authorized for detection and/or diagnosis of SARS-CoV-2 by FDA under an Emergency Use Authorization (EUA). This EUA will remain  in effect (meaning this test can be used) for the duration of the COVID-19 declaration under Section 564(b)(1) of the Act, 21 U.S.C.section 360bbb-3(b)(1), unless the authorization is terminated  or revoked sooner.       Influenza A by PCR NEGATIVE NEGATIVE Final   Influenza B by PCR NEGATIVE NEGATIVE Final    Comment: (NOTE) The Xpert Xpress SARS-CoV-2/FLU/RSV plus assay is intended as an aid in the diagnosis of influenza from Nasopharyngeal swab specimens and should not be used as a sole basis for treatment. Nasal washings and aspirates are  unacceptable for Xpert Xpress SARS-CoV-2/FLU/RSV testing.  Fact Sheet for Patients: BloggerCourse.com  Fact Sheet for Healthcare Providers: SeriousBroker.it  This test is not yet approved or cleared by the [place] FDA and has been authorized for detection and/or diagnosis of SARS-CoV-2 by FDA under an Emergency Use Authorization (EUA). This EUA will remain in effect (meaning this test can be used) for the duration of the COVID-19 declaration under Section 564(b)(1) of the Act, 21 U.S.C. section 360bbb-3(b)(1), unless the authorization is terminated or revoked.     Resp Syncytial Virus by PCR NEGATIVE NEGATIVE Final    Comment: (NOTE) Fact Sheet for Patients: BloggerCourse.com  Fact Sheet for Healthcare Providers: SeriousBroker.it  This test is not yet approved or cleared by the [place] FDA and has been authorized for detection and/or diagnosis of SARS-CoV-2 by FDA under an Emergency Use Authorization (EUA). This EUA will remain in effect (meaning this test can be used) for the duration of the COVID-19 declaration under Section 564(b)(1) of the Act, 21 U.S.C. section 360bbb-3(b)(1), unless the authorization is terminated or revoked.  Performed at [facility], [address] [place], [place] [address]          Radiology Studies: US Venous Img Lower Bilateral (DVT) Result Date: 04/02/2023 CLINICAL DATA:  BILATERAL lower extremity pain times months. 191478 Lower extremity pain [id number] EXAM: BILATERAL LOWER EXTREMITY VENOUS DOPPLER ULTRASOUND TECHNIQUE: Gray-scale sonography with graded compression, as well as color Doppler and duplex ultrasound were performed to evaluate the lower extremity deep venous systems from the level of the common femoral vein and including the common femoral, femoral, profunda femoral, popliteal and calf veins including the  posterior tibial, peroneal and gastrocnemius veins when visible. The superficial great saphenous vein was also interrogated. Spectral Doppler was utilized to evaluate flow at rest and with distal augmentation maneuvers in the common femoral, femoral and popliteal veins. COMPARISON:  Lower extremity venous duplex, most recently 03/25/2023, POSITIVE for BILATERAL lower extremity DVT of the RIGHT femoral and LEFT popliteal veins. FINDINGS: RIGHT LOWER EXTREMITY VENOUS Normal compressibility of the RIGHT common femoral and visualized portions of profunda femoral vein and great saphenous veins. Similar appearance of near-occlusive filling defect involving the RIGHT femoral, popliteal and visualized portions of the calf veins OTHER No evidence of superficial thrombophlebitis or abnormal fluid collection. Limitations: none LEFT LOWER EXTREMITY VENOUS Normal compressibility of the LEFT common femoral, superficial femoral, profunda femoral vein and great saphenous veins. Similar appearance of near-occlusive filling defect within the LEFT popliteal and visualized portions of the calf veins. OTHER No evidence of superficial thrombophlebitis or abnormal fluid collection. Limitations: none IMPRESSION: Since lower extremity venous duplex dated 03/25/2023; 1. No new or worsening DVT within either lower extremity. 2. Similar appearance of BILATERAL lower extremity DVT, extending from the RIGHT femoral through the calf veins and LEFT popliteal through the calf veins Roanna Banning, MD [facility] Radiology Electronically Signed   By: Roanna Banning M.D.   On: 04/02/2023 13:38           LOS: 3 days   Time spent= 35 mins    Miguel Rota, MD [facility]  If 7PM-7AM, please contact night-coverage  04/04/2023, 11:58 AM

## 2023-04-04 NOTE — Consult Note (Signed)
PHARMACY - ANTICOAGULATION CONSULT NOTE  Pharmacy Consult for Heparin infusion Indication: DVT  Allergies  Allergen Reactions   Other Anaphylaxis    peanuts   Morphine And Codeine Itching   Tree Extract Other (See Comments)    ALMONDS  ---- EYE/ FACIAL SWELLING   Wound Dressing Adhesive Rash   Patient Measurements: Height: 5\' 10"  (177.8 cm) Weight: 67.6 kg (149 lb 0.5 oz) IBW/kg (Calculated) : 73 Heparin Dosing Weight: 67.6 kg   Vital Signs: Temp: 97.4 F (36.3 C) (12/20 0444) Temp Source: Oral (12/20 0444) BP: 109/53 (12/20 0444) Pulse Rate: 99 (12/20 0444)  Labs: Recent Labs    04/02/23 0547 04/02/23 1735 04/02/23 1735 04/03/23 0015 04/03/23 0534 04/03/23 0838 04/03/23 1518 04/04/23 0531  HGB 8.7*  --   --   --  10.2*  --   --  8.5*  HCT 28.0*  --   --   --  32.5*  --   --  27.6*  PLT 170  --   --   --  223  --   --  210  APTT  --  50*   < > 61*  --  100* 83* 74*  LABPROT  --  19.2*  --   --   --   --   --   --   INR  --  1.6*  --   --   --   --   --   --   HEPARINUNFRC  --  0.89*  --  0.82*  --   --   --  0.29*  CREATININE 0.57*  --   --   --  0.66  --   --  0.47*   < > = values in this interval not displayed.   Estimated Creatinine Clearance: 91.5 mL/min (A) (by C-G formula based on SCr of 0.47 mg/dL (L)).  Medications:  On PTA apixaban for VTE treatment - last dose taken 12/17 @ 0348   Assessment: Tony Benson is a 62 year old male with history of small nonocclusive segmental/subsegmental pulmonary emboli in the left upper lobe in November 2024 for which he was started on apixaban. Also found to have bilateral lower extremity DVTs extending into the right femoral through the calf veins and left popliteal through the calf veins.  Patient presented with hemoglobin or 6.5 so apixaban was held and patient received blood transfusions. Pharmacy has been consulted for initiation and management of a heparin infusion.   Baseline labs: Hgb 8.7, PLT 170, aPTT, PT/INR,  and HL ordered.   Acute DVT in November, Apixaban 10mg  BID x 1 week completed. Taking apixaban 5mg  po BID PTA. Last dose of apixaban was taken 12/17 @ 0348.  Goal of Therapy:  Heparin level 0.3-0.7 units/ml aPTT 66-102 seconds Monitor platelets by anticoagulation protocol: Yes   Date Time Results Comments  12/18 1810  Heparin infusion started at 1150 un/hr, no bolus  12/19 0015 aPTT=61 HL=0.82 Bolus plus rate change 1250 un/hr  12/19 0838 aPTT=100 Therapeutic x 1  12/19 1518 aPTT= 83 Therapeutic x 2    Plan:  12/20 @ 0531:   HL = 0.29,  aPTT = 74 - aPTT therapeutic,  HL is slightly SUBtherapeutic so Eliquis is no longer exerting significant effect - Will use HL to guide dosing from here on  - Will order heparin 1000 units IV X 1 bolus and increase drip rate to 1350 units/hr - Will recheck HL 6 hrs after rate change   Tennelle Taflinger  D, PharmD 04/04/2023 6:23 AM

## 2023-04-04 NOTE — Consult Note (Signed)
WOC consulted 12/19 for coccyx and penile wounds, orders written Images reviewed this am, additional scattered partial thickness skin breakdown on LE, I have updated orders for this as well based on use of the nursing skin care order set.   Morgyn Marut [facility], CNS, [employer] [phone]

## 2023-04-04 NOTE — Progress Notes (Signed)
Rounding Note    Patient Name: Tony Benson Date of Encounter: 04/04/2023  [facility] Cardiologist: Yvonne Kendall, MD   Subjective   No chest pain, dyspnea, palpitations, dizziness, presyncope, or syncope.  Inpatient Medications    Scheduled Meds:  amLODipine  10 mg Oral Daily   atorvastatin  10 mg Oral Daily   Chlorhexidine Gluconate Cloth  6 each Topical Daily   empagliflozin  25 mg Oral Daily   feeding supplement  237 mL Oral BID BM   fentaNYL  1 patch Transdermal Q72H   guaiFENesin  15 mL Oral Q6H   insulin aspart  0-15 Units Subcutaneous TID AC & HS   ipratropium-albuterol  3 mL Nebulization BID   metoprolol tartrate  50 mg Oral BID   OLANZapine  5 mg Oral QHS   omega-3 acid ethyl esters  1 g Oral Daily   senna-docusate  1 tablet Oral BID   Continuous Infusions:  azithromycin 500 mg (04/04/23 0610)   cefTRIAXone (ROCEPHIN)  IV 2 g (04/04/23 0513)   heparin     PRN Meds: acetaminophen **OR** acetaminophen, ALPRAZolam, chlorpheniramine-HYDROcodone, hydrALAZINE, HYDROmorphone, ipratropium-albuterol, magnesium hydroxide, metoprolol tartrate, naloxone, ondansetron **OR** ondansetron (ZOFRAN) IV, senna-docusate, traZODone   Vital Signs    Vitals:   04/04/23 0838 04/04/23 0922 04/04/23 0923 04/04/23 1651  BP: (!) 107/54 (!) 107/54 (!) 107/54 107/60  Pulse: (!) 109 (!) 109  (!) 101  Resp: 16     Temp: 97.7 F (36.5 C)   100 F (37.8 C)  TempSrc:      SpO2: 92%   98%  Weight:      Height:        Intake/Output Summary (Last 24 hours) at 04/04/2023 1657 Last data filed at 04/04/2023 4782 Gross per 24 hour  Intake 1037.82 ml  Output 1000 ml  Net 37.82 ml      03/31/2023    7:10 PM 03/26/2023    8:32 AM 03/24/2023    1:42 PM  Last 3 Weights  Weight (lbs) 149 lb 0.5 oz 149 lb 3.2 oz 150 lb  Weight (kg) 67.6 kg 67.677 kg 68.04 kg      Physical Exam   GEN: Frail appearing, in no acute distress.   Neck: No JVD Cardiac: RRR, no murmurs,  rubs, or gallops.  Respiratory: Clear to auscultation bilaterally. GI: Soft, nontender, non-distended  MS: 2+ pitting LE edema bilaterally; No deformity. Neuro:  Nonfocal  Psych: Normal affect   Labs    High Sensitivity Troponin:   Recent Labs  Lab 03/31/23 2200 04/01/23 0353  TROPONINIHS 19* 38*     Chemistry Recent Labs  Lab 03/31/23 2200 04/01/23 0547 04/02/23 0547 04/03/23 0534 04/04/23 0531  NA 139   < > 142 141 142  K 4.5   < > 4.3 4.3 3.7  CL 109   < > 109 111 111  CO2 19*   < > 25 19* 23  GLUCOSE 198*   < > 193* 180* 152*  BUN 31*   < > 20 15 14   CREATININE 0.76   < > 0.57* 0.66 0.47*  CALCIUM 9.0   < > 8.8* 9.1 8.6*  PROT 6.5  --   --   --   --   ALBUMIN 2.1*  --   --   --   --   AST 23  --   --   --   --   ALT 28  --   --   --   --  ALKPHOS 670*  --   --   --   --   BILITOT 1.2*  --   --   --   --   GFRNONAA >60   < > >60 >60 >60  ANIONGAP 11   < > 8 11 8    < > = values in this interval not displayed.    Lipids No results for input(s): "CHOL", "TRIG", "HDL", "LABVLDL", "LDLCALC", "CHOLHDL" in the last 168 hours.  Hematology Recent Labs  Lab 04/02/23 0547 04/03/23 0534 04/04/23 0531  WBC 14.8* 14.6* 9.8  RBC 3.35* 4.00* 3.29*  HGB 8.7* 10.2* 8.5*  HCT 28.0* 32.5* 27.6*  MCV 83.6 81.3 83.9  MCH 26.0 25.5* 25.8*  MCHC 31.1 31.4 30.8  RDW 18.2* 18.1* 17.8*  PLT 170 223 210   Thyroid No results for input(s): "TSH", "FREET4" in the last 168 hours.  BNPNo results for input(s): "BNP", "PROBNP" in the last 168 hours.  DDimer No results for input(s): "DDIMER" in the last 168 hours.   Radiology      Cardiac Studies   04/01/23 Echocardiogram 1. Left ventricular ejection fraction, by estimation, is 60 to 65%. The  left ventricle has normal function. The left ventricle has no regional  wall motion abnormalities. There is moderate left ventricular hypertrophy.  Left ventricular diastolic  parameters are consistent with Grade II diastolic  dysfunction  (pseudonormalization).   2. Right ventricular systolic function is normal. The right ventricular  size is normal. There is mildly elevated pulmonary artery systolic  pressure.   3. There is equivocal early diastolic collapse of the right ventricle.  However, mitral and tricuspid valve inflow velocity respiratory variation  is not consistent with tamponade physiology.. Large pericardial effusion.  The pericardial effusion is  circumferential.   4. The mitral valve is normal in structure. Trivial mitral valve  regurgitation. No evidence of mitral stenosis.   5. The aortic valve is tricuspid. Aortic valve regurgitation is not  visualized. No aortic stenosis is present.   6. The inferior vena cava is normal in size with <50% respiratory  variability, suggesting right atrial pressure of 8 mmHg.   Comparison(s): A prior study was performed on 03/04/2023. The pericardial  effusion appears somewhat larger.    03/04/23 Echocardiogram 1. Left ventricular ejection fraction, by estimation, is >55%. The left  ventricle has normal function. The left ventricle has no regional wall  motion abnormalities.   2. Right ventricular systolic function is normal. The right ventricular  size is normal.   3. Moderate pericardial effusion. The pericardial effusion is  circumferential. There is no evidence of cardiac tamponade.   4. The mitral valve is normal in structure.    03/01/23 Echocardiogram 1. Left ventricular ejection fraction, by estimation, is 60 to 65%. The  left ventricle has normal function. The left ventricle has no regional  wall motion abnormalities. Left ventricular diastolic parameters are  indeterminate.   2. Right ventricular systolic function is normal. The right ventricular  size is normal. There is moderately elevated pulmonary artery systolic  pressure. The estimated right ventricular systolic pressure is 54.0 mmHg.   3. Large pericardial effusion. The pericardial  effusion is  circumferential. There is no evidence of cardiac tamponade. 1.95 off the  RV free wall, 2.16 cm off the apical region, 1.70 cm off the LV free wall.  IVC is not dilated at 1.16 cm   4. The mitral valve is normal in structure. No evidence of mitral valve  regurgitation. No evidence  of mitral stenosis.   5. The aortic valve is tricuspid. Aortic valve regurgitation is not  visualized. No aortic stenosis is present.   6. The inferior vena cava is normal in size with greater than 50%  respiratory variability, suggesting right atrial pressure of 3 mmHg.  Patient Profile     Tony Benson is a 62 y.o. male with a hx of metastatic pancreatic cancer on palliative chemotherapy, asthma, prostate cancer, T2DM, HTN, dyslipidemia, OSA, and hx DVT and PE on Eliquis who is being seen for the continued evaluation of pericardial effusion.   Assessment & Plan    Pericardial effusion - First noted on CT angiogram 02/27/2023. Echocardiogram on 03/01/23 showed large circumferential pericardial effusion, repeat echocardiogram on 03/04/23 showed moderate circumferential pericardial effusion, both without evidence of tamponade physiology - Most recent echo 04/01/23 showed pericardial effusion slightly increased compared to previous echo - Suspect secondary to metastatic pancreatic cancer - Previously, not a good candidate for invasive procedures in the setting of metastatic pancreatic cancer, acute anemia, hemodynamically stability, and lack of symptoms -Cardiology asked to reevaluate patient for possible pericardiocentesis -Risks and benefits were discussed with patient and family in detail with ultimate outcome of pericardiocentesis likely to not significantly change his overall symptomology or prognosis -Patient is initially agreeable to moving forward pericardiocentesis, though wife and daughter present would like to discuss this further with the patient in private, we will revisit on rounds on  12/21 - Eliquis was held for concern of increasing pericardial effusion/anemia - Hemoglobin 8.7>>10.2>>8.5 - IV heparin stopped with progressive anemia  Elevated troponin - Troponin peaked at 38 on 12/17 - Suspect demand ischemia - Denies chest pain, EKG without ischemic changes - Echo showed LVEF 60-65% without wall motional abnormalities - No plans for inpatient ischemic testing  Hx DVT/PE - Admitted at [facility] in 02/2023 for small bilateral pulmonary emboli and bilateral LE DVTs. Discharged on Eliquis 5 mg.  - Eliquis was held for concern of increasing pericardial effusion/anemia - IV heparin held as above  Remaining comorbid conditions and normocytic anemia - Status post pRBC transfusion this admission with down trending Hgb today - Per IM and oncology     For questions or updates, please contact [facility] Please consult [email or web address] for contact info under      Signed, Eula Listen, PA-C  04/04/2023, 4:57 PM

## 2023-04-04 NOTE — Consult Note (Signed)
[facility] CONSULT NOTE  Patient Care Team: Sherrie Mustache, MD as PCP - General (Internal Medicine) End, Cristal Deer, MD as PCP - Cardiology (Cardiology) Luretha Murphy, MD as Consulting Physician (General Surgery) Earna Coder, MD as Consulting Physician (Oncology) Benita Gutter, RN as Oncology Nurse Navigator  CHIEF COMPLAINTS/PURPOSE OF CONSULTATION:   HISTORY OF PRESENTING ILLNESS:  Tony Benson 62 y.o.  male pleasant patient with newly diagnosed metastatic pancreatic cancer, diabetes; history of PE/DVT on Eliquis presented to hospital with acute onset of dyspnea.  Patient is currently on palliative chemotherapy.  Further workup including a CTA-no evidence of any acute pulm embolism however showed a large pericardial effusion which is gotten worse from about 6 weeks ago.  Also some concern of pneumonia from aspiration; also noted to have increased nodularity of the pleura.  However noted to have mild improvement of the hepatic metastasis.  Patient is currently being treated for pneumonia with IV antibiotics.  He notes that some improvement however, there is still concerned about symptomatic pericardial effusion which is likely malignant.  Cardiology's been consulted.  Review of Systems  Constitutional:  Positive for malaise/fatigue and weight loss. Negative for chills, diaphoresis and fever.  HENT:  Negative for nosebleeds and sore throat.   Eyes:  Negative for double vision.  Respiratory:  Positive for cough and shortness of breath. Negative for hemoptysis and wheezing.   Cardiovascular:  Positive for leg swelling. Negative for chest pain, palpitations and orthopnea.  Gastrointestinal:  Positive for abdominal pain and nausea. Negative for blood in stool, constipation, diarrhea, heartburn, melena and vomiting.  Genitourinary:  Negative for dysuria, frequency and urgency.  Musculoskeletal:  Negative for back pain and joint pain.  Skin: Negative.  Negative  for itching and rash.  Neurological:  Negative for dizziness, tingling, focal weakness, weakness and headaches.  Endo/Heme/Allergies:  Does not bruise/bleed easily.  Psychiatric/Behavioral:  Negative for depression. The patient is not nervous/anxious and does not have insomnia.     MEDICAL HISTORY:  Past Medical History:  Diagnosis Date   Asthma    Cancer (HCC)    prostate   Diabetes mellitus without complication (HCC)    type II   Hyperlipidemia    Hypertension    Sleep apnea    uses Cpap machine     SURGICAL HISTORY: Past Surgical History:  Procedure Laterality Date   CHOLECYSTECTOMY     COLONOSCOPY WITH PROPOFOL N/A 11/18/2018   Procedure: COLONOSCOPY WITH PROPOFOL;  Surgeon: Pasty Spillers, MD;  Location: [facility] ENDOSCOPY;  Service: Gastroenterology;  Laterality: N/A;   COLONOSCOPY WITH PROPOFOL N/A 02/05/2023   Procedure: COLONOSCOPY WITH PROPOFOL;  Surgeon: Toney Reil, MD;  Location: [facility] ENDOSCOPY;  Service: Gastroenterology;  Laterality: N/A;   IR IMAGING GUIDED PORT INSERTION  03/24/2023   IR US LIVER BIOPSY  03/03/2023   LAPAROSCOPIC RETROPUBIC PROSTATECTOMY     2023   LUNG SURGERY     "ligation of thoracic duct"   PROSTATE BIOPSY N/A 09/21/2020   Procedure: PROSTATE BIOPSY [name];  Surgeon: [name], MD;  Location: [facility] ORS;  Service: Urology;  Laterality: N/A;    SOCIAL HISTORY: Social History   Socioeconomic History   Marital status: Married    Spouse name: Not on file   Number of children: Not on file   Years of education: Not on file   Highest education level: Not on file  Occupational History   Occupation: Automotive engineer   Tobacco Use   Smoking status:  Never   Smokeless tobacco: Never  Vaping Use   Vaping status: Never Used  Substance and Sexual Activity   Alcohol use: No   Drug use: No   Sexual activity: Not on file  Other Topics Concern   Not on file  Social History Narrative   Not on file   Social Drivers of Health    Financial Resource Strain: Not on file  Food Insecurity: No Food Insecurity (04/02/2023)   Hunger Vital Sign    Worried About Running Out of Food in the Last Year: Never true    Ran Out of Food in the Last Year: Never true  Transportation Needs: No Transportation Needs (04/02/2023)   PRAPARE - Administrator, Civil Service (Medical): No    Lack of Transportation (Non-Medical): No  Physical Activity: Not on file  Stress: Not on file  Social Connections: Not on file  Intimate Partner Violence: Not At Risk (04/02/2023)   Humiliation, Afraid, Rape, and Kick questionnaire    Fear of Current or Ex-Partner: No    Emotionally Abused: No    Physically Abused: No    Sexually Abused: No    FAMILY HISTORY: Family History  Problem Relation Age of Onset   Allergies Sister     ALLERGIES:  is allergic to other, morphine and codeine, tree extract, and wound dressing adhesive.  MEDICATIONS:  Current Facility-Administered Medications  Medication Dose Route Frequency Provider Last Rate Last Admin   acetaminophen (TYLENOL) tablet 650 mg  650 mg Oral Q6H PRN Mansy, Jan A, MD       Or   acetaminophen (TYLENOL) suppository 650 mg  650 mg Rectal Q6H PRN Mansy, Vernetta Honey, MD       ALPRAZolam Prudy Feeler) tablet 0.5 mg  0.5 mg Oral BID PRN Mansy, Jan A, MD       amLODipine (NORVASC) tablet 10 mg  10 mg Oral Daily Mansy, Jan A, MD       atorvastatin (LIPITOR) tablet 10 mg  10 mg Oral Daily Mansy, Jan A, MD   10 mg at 04/04/23 0900   azithromycin (ZITHROMAX) 500 mg in sodium chloride 0.9 % 250 mL IVPB  500 mg Intravenous Q24H Mansy, Jan A, MD 250 mL/hr at 04/04/23 0610 500 mg at 04/04/23 0610   cefTRIAXone (ROCEPHIN) 2 g in sodium chloride 0.9 % 100 mL IVPB  2 g Intravenous Q24H Mansy, Jan A, MD 200 mL/hr at 04/04/23 0513 2 g at 04/04/23 0513   Chlorhexidine Gluconate Cloth 2 % PADS 6 each  6 each Topical Daily Loyce Dys, MD   6 each at 04/04/23 0903   chlorpheniramine-HYDROcodone (TUSSIONEX)  10-8 MG/5ML suspension 5 mL  5 mL Oral Q12H PRN Mansy, Jan A, MD       empagliflozin (JARDIANCE) tablet 25 mg  25 mg Oral Daily Mansy, Jan A, MD   25 mg at 04/04/23 0859   feeding supplement (ENSURE ENLIVE / ENSURE PLUS) liquid 237 mL  237 mL Oral BID BM Mansy, Jan A, MD   237 mL at 04/04/23 0904   fentaNYL (DURAGESIC) 12 MCG/HR 1 patch  1 patch Transdermal Q72H Mansy, Jan A, MD   1 patch at 04/02/23 1110   guaiFENesin (ROBITUSSIN) 100 MG/5ML liquid 15 mL  15 mL Oral Q6H Jawo, Modou L, NP   15 mL at 04/04/23 0859   heparin ADULT infusion 100 units/mL (25000 units/259mL)  1,350 Units/hr Intravenous Continuous Miguel Rota, MD  hydrALAZINE (APRESOLINE) injection 10 mg  10 mg Intravenous Q4H PRN Amin, Ankit C, MD       HYDROmorphone (DILAUDID) tablet 2-4 mg  2-4 mg Oral Q4H PRN Loyce Dys, MD   4 mg at 04/04/23 1191   insulin aspart (novoLOG) injection 0-15 Units  0-15 Units Subcutaneous TID AC & HS Manuela Schwartz, NP   3 Units at 04/04/23 1234   ipratropium-albuterol (DUONEB) 0.5-2.5 (3) MG/3ML nebulizer solution 3 mL  3 mL Nebulization BID Rosezetta Schlatter T, MD   3 mL at 04/04/23 0728   ipratropium-albuterol (DUONEB) 0.5-2.5 (3) MG/3ML nebulizer solution 3 mL  3 mL Nebulization Q4H PRN Amin, Ankit C, MD       magnesium hydroxide (MILK OF MAGNESIA) suspension 30 mL  30 mL Oral Daily PRN Mansy, Jan A, MD       metoprolol tartrate (LOPRESSOR) injection 5 mg  5 mg Intravenous Q4H PRN Amin, Ankit C, MD       metoprolol tartrate (LOPRESSOR) tablet 50 mg  50 mg Oral BID Mansy, Jan A, MD   50 mg at 04/04/23 4782   naloxone [facility]) nasal spray 4 mg/0.1 mL  1 spray Nasal PRN Mansy, Jan A, MD       OLANZapine (ZYPREXA) tablet 5 mg  5 mg Oral QHS Mansy, Jan A, MD   5 mg at 04/03/23 2135   omega-3 acid ethyl esters (LOVAZA) capsule 1 g  1 g Oral Daily Mansy, Jan A, MD   1 g at 04/04/23 0900   ondansetron (ZOFRAN) tablet 4 mg  4 mg Oral Q6H PRN Mansy, Jan A, MD       Or   ondansetron [facility]) injection  4 mg  4 mg Intravenous Q6H PRN Mansy, Jan A, MD       senna-docusate (Senokot-S) tablet 1 tablet  1 tablet Oral BID Rosezetta Schlatter T, MD   1 tablet at 04/04/23 0900   senna-docusate (Senokot-S) tablet 1 tablet  1 tablet Oral QHS PRN Amin, Ankit C, MD       traZODone (DESYREL) tablet 25 mg  25 mg Oral QHS PRN Mansy, Jan A, MD   25 mg at 04/03/23 2153    PHYSICAL EXAMINATION:   Vitals:   04/04/23 0922 04/04/23 0923  BP: (!) 107/54 (!) 107/54  Pulse: (!) 109   Resp:    Temp:    SpO2:     Filed Weights   03/31/23 1910  Weight: 149 lb 0.5 oz (67.6 kg)    Physical Exam Vitals and nursing note reviewed.  HENT:     Head: Normocephalic and atraumatic.     Mouth/Throat:     Pharynx: Oropharynx is clear.  Eyes:     Extraocular Movements: Extraocular movements intact.     Pupils: Pupils are equal, round, and reactive to light.  Cardiovascular:     Rate and Rhythm: Regular rhythm. Tachycardia present.  Pulmonary:     Comments: Decreased breath sounds bilaterally.  Abdominal:     Palpations: Abdomen is soft.  Musculoskeletal:        General: Normal range of motion.     Cervical back: Normal range of motion.  Skin:    General: Skin is warm.  Neurological:     General: No focal deficit present.     Mental Status: He is alert and oriented to person, place, and time.  Psychiatric:        Behavior: Behavior normal.        Judgment:  Judgment normal.     LABORATORY DATA:  I have reviewed the data as listed Lab Results  Component Value Date   WBC 9.8 04/04/2023   HGB 8.5 (L) 04/04/2023   HCT 27.6 (L) 04/04/2023   MCV 83.9 04/04/2023   PLT 210 04/04/2023   Recent Labs    03/07/23 0248 03/12/23 0846 03/24/23 1326 03/26/23 0813 03/31/23 2200 04/01/23 0547 04/02/23 0547 04/03/23 0534 04/04/23 0531  NA  --    < > 137 136 139   < > 142 141 142  K  --    < > 4.7 4.6 4.5   < > 4.3 4.3 3.7  CL  --    < > 103 105 109   < > 109 111 111  CO2  --    < > 25 25 19*   < > 25 19* 23   GLUCOSE  --    < > 285* 273* 198*   < > 193* 180* 152*  BUN  --    < > 26* 19 31*   < > 20 15 14   CREATININE  --    < > 0.71 0.52* 0.76   < > 0.57* 0.66 0.47*  CALCIUM  --    < > 10.1 10.0 9.0   < > 8.8* 9.1 8.6*  GFRNONAA  --    < > >60 >60 >60   < > >60 >60 >60  PROT 6.5   < > 6.8 6.3* 6.5  --   --   --   --   ALBUMIN 2.0*   < > 2.3* 2.2* 2.1*  --   --   --   --   AST 31   < > 25 22 23   --   --   --   --   ALT 37   < > 23 19 28   --   --   --   --   ALKPHOS 463*   < > 666* 591* 670*  --   --   --   --   BILITOT 0.5   < > 1.2* 0.7 1.2*  --   --   --   --   BILIDIR 0.1  --   --   --   --   --   --   --   --   IBILI 0.4  --   --   --   --   --   --   --   --    < > = values in this interval not displayed.    RADIOGRAPHIC STUDIES: I have personally reviewed the radiological images as listed and agreed with the findings in the report. DG Swallowing Func-Speech Pathology Result Date: 04/04/2023 Table formatting from the original result was not included. Modified Barium Swallow Study Patient Details Name: STEVESON BOLING MRN: [id number] Date of Birth: 12-09-1960 Today's Date: 04/04/2023 HPI/PMH: HPI: Pt is a 62 y.o. male with medical history significant for Metastatic pancreatic Cancer on Palliative Chemotherapy, asthma, prostate Cancer, type 2 diabetes mellitus, hypertension, dyslipidemia, and PE on Eliquis, who presented to the emergency room with acute onset of dyspnea with associated cough productive of yellow sputum without wheezing.  Today patient was found to have hemoglobin 6.5.  He agreed for blood transfusion however during transfusion he developed worsening shortness of breath with tachycardia.  Transfusion was aborted and blood return to blood bank for transfusion reaction workup.  Patient has also been seen by cardiologist  and awaiting repeat echo before decision is made on Large pericardial effusion, lung metastasis per MD note.  Per Oncology note: Patient was hospitalized November 2024  with shortness of breath/acute PE, bilateral DVT.  CT of the abdomen and pelvis found to have a pancreatic head mass and liver masses.  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  Patient is status post 2 cycles of gemcitabine and Abraxane.  Now admitted to the hospital with pneumonia.  Palliative Care was consulted for GOC.   Chest CT: Large pericardial effusion increased compared to 02/27/2023.  Clinical correlation recommended to exclude tamponade physiology.  3. Increased bilateral centrilobular micro nodules and tree-in-bud  opacities with more confluence opacities in the right lung, likely  due to bronchopneumonia from aspiration. Lymphangitic spread of  tumor could appear similarly.  4. Irregular pleural thickening or loculated pleural effusion in the  lower right chest has increased compared to 02/27/2023. There is  associated pleural nodularity in the posterior lower lobe. This may be due to bronchopneumonia though metastases are not excluded. Clinical Impression: Clinical Impression: Pt presents with profound oropharyngeal dysphagia that is likely related to overall deconditioned state. As such he is at a very high risk of aspiration, malnutrition and dehydration when consuming POs. Specifically, when consuming thin liquids via spoon, nectar thick liquids via spoon and small cup sips as well as small boluses of puree, he presents with sensed and silent aspiration of all consistencies. When aspiration is sensed, he produced a delayed weak non-productive cough that was not effective in clearing aspirates or laryngeal penetrates. As such, would recommend NPO and discussion regarding GOC. Recommend extensive oral care to reduce the bacterial load of pt's salvia as his risk of aspirating his own salvia is high.     Analysis of swallow function:  Oral phase is c/b decrease propulsion of boluses over base of tongue  Pharyngeal phase is c/b decreased base of tongue  strength, weak hyoid movement, decreased epiglottic deflection, delayed swallow initiation to leave of pyriform sinuses and weak pharyngeal parastasis resulting in reduced UES opening and passage of boluses as well s gross widespread pharyngeal residue throughout pharynx. Boluses were observed to be resting within the laryngeal vestibule and on pt's vocal cords with no ability to clear these penetrates even with a cued cough. Aspiration was gross when consuming thin liquids via spoon d/t delayed pharyngeal swallow. While less of the nectar and puree were aspirated, pharyngeal residue was greater resulting in continued build-up of penetrates. Factors that may increase risk of adverse event in presence of aspiration Rubye Oaks & Clearance Coots 2021): Factors that may increase risk of adverse event in presence of aspiration Rubye Oaks & Clearance Coots 2021): Poor general health and/or compromised immunity; Reduced cognitive function; Limited mobility; Frail or deconditioned; Dependence for feeding and/or oral hygiene; Inadequate oral hygiene; Reduced saliva; Weak cough; Frequent aspiration of large volumes Recommendations/Plan: Swallowing Evaluation Recommendations Swallowing Evaluation Recommendations Recommendations: NPO Medication Administration: Via alternative means Oral care recommendations: Oral care QID (4x/day) Recommended consults: Consider Palliative care Caregiver Recommendations: -- (TBD) Treatment Plan Treatment Plan Treatment recommendations: No treatment recommended at this time Follow-up recommendations: No SLP follow up Recommendations Comment: Palliative Care f/u for GOC Recommendations Recommendations for follow up therapy are one component of a multi-disciplinary discharge planning process, led by the attending physician.  Recommendations may be updated based on patient status, additional functional criteria and insurance authorization. Assessment: Orofacial Exam: Orofacial Exam Oral Cavity: Oral Hygiene: Xerostomia  Oral Cavity - Dentition: Adequate  natural dentition Orofacial Anatomy: WFL Oral Motor/Sensory Function: WFL Anatomy: Anatomy: WFL Boluses Administered: Boluses Administered Boluses Administered: Thin liquids (Level 0); Mildly thick liquids (Level 2, nectar thick); Puree  Oral Impairment Domain: Oral Impairment Domain Lip Closure: No labial escape Tongue control during bolus hold: Not tested Bolus preparation/mastication: Slow prolonged chewing/mashing with complete recollection Bolus transport/lingual motion: Delayed initiation of tongue motion (oral holding); Slow tongue motion Oral residue: Residue collection on oral structures Location of oral residue : Floor of mouth; Tongue Initiation of pharyngeal swallow : Pyriform sinuses  Pharyngeal Impairment Domain: Pharyngeal Impairment Domain Soft palate elevation: No bolus between soft palate (SP)/pharyngeal wall (PW) Laryngeal elevation: Partial superior movement of thyroid cartilage/partial approximation of arytenoids to epiglottic petiole Anterior hyoid excursion: Partial anterior movement; No anterior movement Epiglottic movement: Partial inversion; No inversion Laryngeal vestibule closure: None, wide column air/contrast in laryngeal vestibule; Incomplete, narrow column air/contrast in laryngeal vestibule Pharyngeal stripping wave : Present - diminished Pharyngoesophageal segment opening: Minimal distention/minimal duration, marked obstruction of flow; Partial distention/partial duration, partial obstruction of flow Tongue base retraction: Narrow column of contrast or air between tongue base and PPW; Wide column of contrast or air between tongue base and PPW Pharyngeal residue: Majority of contrast within or on pharyngeal structures Location of pharyngeal residue: Tongue base; Valleculae; Pharyngeal wall; Pyriform sinuses  Esophageal Impairment Domain: Esophageal Impairment Domain Esophageal clearance upright position: Complete clearance, esophageal coating Pill:  No data recorded Penetration/Aspiration Scale Score: Penetration/Aspiration Scale Score 7.  Material enters airway, passes BELOW cords and not ejected out despite cough attempt by patient: Thin liquids (Level 0); Mildly thick liquids (Level 2, nectar thick); Puree 8.  Material enters airway, passes BELOW cords without attempt by patient to eject out (silent aspiration) : Thin liquids (Level 0); Mildly thick liquids (Level 2, nectar thick); Puree Compensatory Strategies: No data recorded  General Information: Caregiver present: No  Diet Prior to this Study: Dysphagia 3 (mechanical soft); Thin liquids (Level 0)   Temperature : Normal   Respiratory Status: WFL   Supplemental O2: None (Room air)   History of Recent Intubation: No  Behavior/Cognition: Alert; Cooperative; Distractible; Requires cueing Self-Feeding Abilities: Needs assist with self-feeding Baseline vocal quality/speech: Normal Volitional Cough: Able to elicit Volitional Swallow: Able to elicit Exam Limitations: No limitations Goal Planning: Prognosis for improved oropharyngeal function: -- (Poor) Barriers to Reach Goals: Cognitive deficits; Time post onset; Severity of deficits; Overall medical prognosis Barriers/Prognosis Comment: Frail; Drowsy; Pain/pain meds; Cancer w/ Metastasis Patient/Family Stated Goal: none stated Consulted and agree with results and recommendations: Physician; Nurse Pain: Pain Assessment Pain Assessment: Faces Faces Pain Scale: 2 Pain Location: R shoulder; BLEs Pain Descriptors / Indicators: Aching Pain Intervention(s): Monitored during session; Premedicated before session End of Session: Start Time:SLP Start Time (ACUTE ONLY): 1430 Stop Time: SLP Stop Time (ACUTE ONLY): 1450 Time Calculation:SLP Time Calculation (min) (ACUTE ONLY): 20 min Charges: SLP Evaluations $ SLP Speech Visit: 1 Visit SLP Evaluations $BSS Swallow: 1 Procedure $MBS Swallow: 1 Procedure $Self Care/Home Management: 8-22 SLP visit diagnosis: SLP Visit  Diagnosis: Dysphagia, oropharyngeal phase (R13.12) Past Medical History: Past Medical History: Diagnosis Date  Asthma   Cancer (HCC)   prostate  Diabetes mellitus without complication (HCC)   type II  Hyperlipidemia   Hypertension   Sleep apnea   uses Cpap machine  Past Surgical History: Past Surgical History: Procedure Laterality Date  CHOLECYSTECTOMY    COLONOSCOPY WITH PROPOFOL N/A 11/18/2018  Procedure: COLONOSCOPY WITH PROPOFOL;  Surgeon: Pasty Spillers, MD;  Location: [facility] ENDOSCOPY;  Service: Gastroenterology;  Laterality: N/A;  COLONOSCOPY WITH PROPOFOL N/A 02/05/2023  Procedure: COLONOSCOPY WITH PROPOFOL;  Surgeon: Toney Reil, MD;  Location: [facility] ENDOSCOPY;  Service: Gastroenterology;  Laterality: N/A;  IR IMAGING GUIDED PORT INSERTION  03/24/2023  IR US LIVER BIOPSY  03/03/2023  LAPAROSCOPIC RETROPUBIC PROSTATECTOMY    2023  LUNG SURGERY    "ligation of thoracic duct"  PROSTATE BIOPSY N/A 09/21/2020  Procedure: PROSTATE BIOPSY [name];  Surgeon: [name], MD;  Location: [facility] ORS;  Service: Urology;  Laterality: N/A; Happi Overton 04/04/2023, 3:25 PM  US Venous Img Lower Bilateral (DVT) Result Date: 04/02/2023 CLINICAL DATA:  BILATERAL lower extremity pain times months. [id number] Lower extremity pain [id number] EXAM: BILATERAL LOWER EXTREMITY VENOUS DOPPLER ULTRASOUND TECHNIQUE: Gray-scale sonography with graded compression, as well as color Doppler and duplex ultrasound were performed to evaluate the lower extremity deep venous systems from the level of the common femoral vein and including the common femoral, femoral, profunda femoral, popliteal and calf veins including the posterior tibial, peroneal and gastrocnemius veins when visible. The superficial great saphenous vein was also interrogated. Spectral Doppler was utilized to evaluate flow at rest and with distal augmentation maneuvers in the common femoral, femoral and popliteal veins. COMPARISON:  Lower extremity venous duplex, most  recently 03/25/2023, POSITIVE for BILATERAL lower extremity DVT of the RIGHT femoral and LEFT popliteal veins. FINDINGS: RIGHT LOWER EXTREMITY VENOUS Normal compressibility of the RIGHT common femoral and visualized portions of profunda femoral vein and great saphenous veins. Similar appearance of near-occlusive filling defect involving the RIGHT femoral, popliteal and visualized portions of the calf veins OTHER No evidence of superficial thrombophlebitis or abnormal fluid collection. Limitations: none LEFT LOWER EXTREMITY VENOUS Normal compressibility of the LEFT common femoral, superficial femoral, profunda femoral vein and great saphenous veins. Similar appearance of near-occlusive filling defect within the LEFT popliteal and visualized portions of the calf veins. OTHER No evidence of superficial thrombophlebitis or abnormal fluid collection. Limitations: none IMPRESSION: Since lower extremity venous duplex dated 03/25/2023; 1. No new or worsening DVT within either lower extremity. 2. Similar appearance of BILATERAL lower extremity DVT, extending from the RIGHT femoral through the calf veins and LEFT popliteal through the calf veins Roanna Banning, MD [facility] Radiology Electronically Signed   By: Roanna Banning M.D.   On: 04/02/2023 13:38   ECHOCARDIOGRAM COMPLETE Result Date: 04/01/2023    ECHOCARDIOGRAM REPORT   Patient Name:   EDD ROWDEN Date of Exam: 04/01/2023 Medical Rec #:  [id number]     Height:       70.0 in Accession #:    [id number]    Weight:       149.0 lb Date of Birth:  [birth year]    BSA:          1.842 m Patient Age:    62 years      BP:           109/52 mmHg Patient Gender: M             HR:           100 bpm. Exam Location:  [facility] Procedure: 2D Echo, Cardiac Doppler and Color Doppler Indications:     Pericardial Effusion I31.3  History:         Patient has prior history of Echocardiogram examinations, most                  recent 03/04/2023.  Risk  Factors:Diabetes, Hypertension and                  Sleep Apnea.  Sonographer:     Cristela Blue Referring Phys:  [id number] JAN A MANSY Diagnosing Phys: Yvonne Kendall MD IMPRESSIONS  1. Left ventricular ejection fraction, by estimation, is 60 to 65%. The left ventricle has normal function. The left ventricle has no regional wall motion abnormalities. There is moderate left ventricular hypertrophy. Left ventricular diastolic parameters are consistent with Grade II diastolic dysfunction (pseudonormalization).  2. Right ventricular systolic function is normal. The right ventricular size is normal. There is mildly elevated pulmonary artery systolic pressure.  3. There is equivocal early diastolic collapse of the right ventricle. However, mitral and tricuspid valve inflow velocity respiratory variation is not consistent with tamponade physiology.. Large pericardial effusion. The pericardial effusion is circumferential.  4. The mitral valve is normal in structure. Trivial mitral valve regurgitation. No evidence of mitral stenosis.  5. The aortic valve is tricuspid. Aortic valve regurgitation is not visualized. No aortic stenosis is present.  6. The inferior vena cava is normal in size with <50% respiratory variability, suggesting right atrial pressure of 8 mmHg. Comparison(s): A prior study was performed on 03/04/2023. The pericardial effusion appears somewhat larger. FINDINGS  Left Ventricle: Left ventricular ejection fraction, by estimation, is 60 to 65%. The left ventricle has normal function. The left ventricle has no regional wall motion abnormalities. The left ventricular internal cavity size was normal in size. There is  moderate left ventricular hypertrophy. Left ventricular diastolic parameters are consistent with Grade II diastolic dysfunction (pseudonormalization). Right Ventricle: The right ventricular size is normal. No increase in right ventricular wall thickness. Right ventricular systolic function is  normal. There is mildly elevated pulmonary artery systolic pressure. The tricuspid regurgitant velocity is 3.00  m/s, and with an assumed right atrial pressure of 8 mmHg, the estimated right ventricular systolic pressure is 44.0 mmHg. Left Atrium: Left atrial size was normal in size. Right Atrium: Right atrial size was normal in size. Pericardium: There is equivocal early diastolic collapse of the right ventricle. However, mitral and tricuspid valve inflow velocity respiratory variation is not consistent with tamponade physiology. A large pericardial effusion is present. The pericardial effusion is circumferential. Mitral Valve: The mitral valve is normal in structure. Trivial mitral valve regurgitation. No evidence of mitral valve stenosis. MV peak gradient, 4.8 mmHg. The mean mitral valve gradient is 3.0 mmHg. Tricuspid Valve: The tricuspid valve is normal in structure. Tricuspid valve regurgitation is mild. Aortic Valve: The aortic valve is tricuspid. Aortic valve regurgitation is not visualized. No aortic stenosis is present. Aortic valve mean gradient measures 4.0 mmHg. Aortic valve peak gradient measures 6.0 mmHg. Aortic valve area, by VTI measures 2.91 cm. Pulmonic Valve: The pulmonic valve was grossly normal. Pulmonic valve regurgitation is trivial. No evidence of pulmonic stenosis. Aorta: The aortic root is normal in size and structure. Pulmonary Artery: The pulmonary artery is not well seen. Venous: The inferior vena cava is normal in size with less than 50% respiratory variability, suggesting right atrial pressure of 8 mmHg. IAS/Shunts: No atrial level shunt detected by color flow Doppler.  LEFT VENTRICLE PLAX 2D LVIDd:         4.10 cm   Diastology LVIDs:         1.80 cm   LV e' medial:    6.42 cm/s LV PW:         1.50 cm   LV E/e' medial:  13.8  LV IVS:        1.20 cm   LV e' lateral:   15.00 cm/s LVOT diam:     2.10 cm   LV E/e' lateral: 5.9 LV SV:         58 LV SV Index:   32 LVOT Area:     3.46 cm   RIGHT VENTRICLE RV Basal diam:  2.90 cm RV Mid diam:    2.50 cm RV S prime:     20.90 cm/s TAPSE (M-mode): 1.8 cm LEFT ATRIUM           Index        RIGHT ATRIUM          Index LA diam:      2.40 cm 1.30 cm/m   RA Area:     9.46 cm LA Vol (A4C): 20.3 ml 11.02 ml/m  RA Volume:   14.30 ml 7.76 ml/m  AORTIC VALVE AV Area (Vmax):    2.70 cm AV Area (Vmean):   2.44 cm AV Area (VTI):     2.91 cm AV Vmax:           122.00 cm/s AV Vmean:          90.900 cm/s AV VTI:            0.200 m AV Peak Grad:      6.0 mmHg AV Mean Grad:      4.0 mmHg LVOT Vmax:         95.00 cm/s LVOT Vmean:        64.000 cm/s LVOT VTI:          0.168 m LVOT/AV VTI ratio: 0.84  AORTA Ao Root diam: 3.05 cm MITRAL VALVE               TRICUSPID VALVE MV Area (PHT): 4.57 cm    TR Peak grad:   36.0 mmHg MV Area VTI:   2.32 cm    TR Vmax:        300.00 cm/s MV Peak grad:  4.8 mmHg MV Mean grad:  3.0 mmHg    SHUNTS MV Vmax:       1.09 m/s    Systemic VTI:  0.17 m MV Vmean:      80.1 cm/s   Systemic Diam: 2.10 cm MV Decel Time: 166 msec MV E velocity: 88.60 cm/s MV A velocity: 78.10 cm/s MV E/A ratio:  1.13 Cristal Deer End MD Electronically signed by Yvonne Kendall MD Signature Date/Time: 04/01/2023/2:19:37 PM    Final    CT Angio Chest PE W/Cm &/Or Wo Cm Result Date: 04/01/2023 CLINICAL DATA:  Cough and fever for 3 days. On chemo for pancreatic cancer. Elevated heart rate. PE suspected. EXAM: CT ANGIOGRAPHY CHEST WITH CONTRAST TECHNIQUE: Multidetector CT imaging of the chest was performed using the standard protocol during bolus administration of intravenous contrast. Multiplanar CT image reconstructions and MIPs were obtained to evaluate the vascular anatomy. RADIATION DOSE REDUCTION: This exam was performed according to the departmental dose-optimization program which includes automated exposure control, adjustment of the mA and/or kV according to patient size and/or use of iterative reconstruction technique. CONTRAST:  75mL OMNIPAQUE IOHEXOL  350 MG/ML SOLN COMPARISON:  Same day chest radiograph and CTA chest 04/28/2022 FINDINGS: Cardiovascular: Right IJ CVC tip at the superior cavoatrial junction. Negative for acute pulmonary embolism. Large pericardial effusion increased compared to 02/27/2023. Clinical correlation recommended to exclude tamponade physiology. Mild aortic atherosclerotic calcification. Mediastinum/Nodes: Debris layering within the posterior trachea.  Esophagus is unremarkable. Surgical clips adjacent to the lower esophagus. Mediastinal and hilar adenopathy is not well evaluated. Lungs/Pleura: Diffuse bronchial wall thickening scattered mucous plugging greatest in the right lower lobe. Bilateral centrilobular micro nodules and tree-in-bud opacities with more confluence opacities in the right lung. Irregular pleural thickening or loculated pleural effusion in the lower right chest has increased compared to 02/27/2023. There is associated pleural nodularity in the posterior lower lobe. For example 14 x 9 mm nodule on series 5/image 97. Pleural calcifications in the posterior right lung base. No pneumothorax. Upper Abdomen: Incompletely evaluated large metastasis in the right hepatic lobe appears slightly increased compared to 02/27/2023, measuring up to 14.5 cm previously 13.3 cm. Partially visualized dilated main pancreatic duct. Pancreatic head is not included in the exam. Small amount of perisplenic ascites. Musculoskeletal: No acute fracture or destructive osseous lesion. Review of the MIP images confirms the above findings. IMPRESSION: 1. Negative for acute pulmonary embolism. 2. Large pericardial effusion increased compared to 02/27/2023. Clinical correlation recommended to exclude tamponade physiology. 3. Increased bilateral centrilobular micro nodules and tree-in-bud opacities with more confluence opacities in the right lung, likely due to bronchopneumonia from aspiration. Lymphangitic spread of tumor could appear similarly. 4.  Irregular pleural thickening or loculated pleural effusion in the lower right chest has increased compared to 02/27/2023. There is associated pleural nodularity in the posterior lower lobe. This may be due to bronchopneumonia though metastases are not excluded. 5. 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 Aortic Atherosclerosis (ICD10-I70.0). Electronically Signed   By: Minerva Fester M.D.   On: 04/01/2023 00:53   DG Chest 2 View Result Date: 03/31/2023 CLINICAL DATA:  Cough and fever EXAM: CHEST - 2 VIEW COMPARISON:  Chest x-ray 03/05/2023.  Chest CT 02/27/2023. FINDINGS: There is a new right chest port with distal catheter tip projecting over the distal SVC. The cardiac silhouette appears mildly enlarged, unchanged. Small right pleural effusion persists. Patchy opacities in the right lung base have decreased, but have not completely resolved. There is a new focal nodular density in the left mid lung measuring 8 mm. The left lung is otherwise clear. There is no pneumothorax or acute fracture. IMPRESSION: 1. Small right pleural effusion persists. 2. Patchy opacities in the right lung base have decreased, but have not completely resolved. 3. New 8 mm nodular density in the left mid lung. Recommend follow-up chest CT. Electronically Signed   By: Darliss Cheney M.D.   On: 03/31/2023 21:10   NM Bone Scan Whole Body Result Date: 03/30/2023 CLINICAL DATA:  Metastatic pancreatic cancer. Evaluate for bone disease. EXAM: NUCLEAR MEDICINE WHOLE BODY BONE SCAN TECHNIQUE: Whole body anterior and posterior images were obtained approximately 3 hours after intravenous injection of radiopharmaceutical. RADIOPHARMACEUTICALS:  21.78 mCi Technetium-55m MDP IV COMPARISON:  CTA chest 02/27/2023 and abdominal MRI 02/28/2023 FINDINGS: No abnormal uptake involving the axial or appendicular skeleton. Expected uptake in the renal collecting systems and urinary bladder. IMPRESSION: Negative bone scan.  No evidence for metastatic  bone disease. Electronically Signed   By: Richarda Overlie M.D.   On: 03/30/2023 21:01   US Venous Img Lower Bilateral Result Date: 03/25/2023 CLINICAL DATA:  Follow-up lower extremity DVT. History of prostate cancer. Patient is on anticoagulation. EXAM: BILATERAL LOWER EXTREMITY VENOUS DOPPLER ULTRASOUND TECHNIQUE: Gray-scale sonography with graded compression, as well as color Doppler and duplex ultrasound were performed to evaluate the lower extremity deep venous systems from the level of the common femoral vein and including the common femoral, femoral,  profunda femoral, popliteal and calf veins including the posterior tibial, peroneal and gastrocnemius veins when visible. The superficial great saphenous vein was also interrogated. Spectral Doppler was utilized to evaluate flow at rest and with distal augmentation maneuvers in the common femoral, femoral and popliteal veins. COMPARISON:  Bilateral lower extremity venous Doppler ultrasound-02/26/2023 (positive for DVT extending from right femoral vein through right tibial veins as well as the left popliteal vein through the left tibial veins) FINDINGS: RIGHT LOWER EXTREMITY Common Femoral Vein: No evidence of thrombus. Normal compressibility, respiratory phasicity and response to augmentation. Saphenofemoral Junction: No evidence of thrombus. Normal compressibility and flow on color Doppler imaging. Profunda Femoral Vein: No evidence of thrombus. Normal compressibility and flow on color Doppler imaging. Femoral Vein: There is predominantly occlusive DVT involving the proximal (image 41), mid (image 44) and distal (image 47) aspects of the right femoral vein, grossly unchanged compared to the 02/26/2023 examination. Popliteal Vein: Redemonstrated near occlusive DVT involving the right popliteal vein (images 47 and 50), similar to the 02/26/2023 examination. Calf Veins: Redemonstrated occlusive DVT involving both paired divisions of the left posterior tibial (image  56) as well as both paired divisions of the right peroneal veins (image 58). Superficial Great Saphenous Vein: No evidence of thrombus. Normal compressibility. Other Findings:  None. _________________________________________________________ LEFT LOWER EXTREMITY Common Femoral Vein: No evidence of thrombus. Normal compressibility, respiratory phasicity and response to augmentation. Saphenofemoral Junction: No evidence of thrombus. Normal compressibility and flow on color Doppler imaging. Profunda Femoral Vein: No evidence of thrombus. Normal compressibility and flow on color Doppler imaging. Femoral Vein: No evidence of thrombus. Normal compressibility, respiratory phasicity and response to augmentation. Popliteal Vein: Redemonstrated occlusive DVT involving the left popliteal vein (images 21 and 25), unchanged compared to the 02/26/2023 examination. Calf Veins: Redemonstrated occlusive DVT involving both paired divisions of the left posterior tibial vein (images 27 and 28), unchanged compared to the 02/26/2023 examination. The left peroneal veins appear patent where imaged (image 30), improved compared to the 02/26/2023 examination. Superficial Great Saphenous Vein: No evidence of thrombus. Normal compressibility. Other Findings:  None. IMPRESSION: 1. Redemonstrated bilateral lower extremity DVT extending from the right femoral vein through the right calf veins, grossly unchanged compared to the 02/26/2023 examination. 2. Resolved left peroneal DVT. 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 Electronically Signed   By: Simonne Come M.D.   On: 03/25/2023 12:10   IR IMAGING GUIDED PORT INSERTION Result Date: 03/24/2023 CLINICAL DATA:  Metastatic pancreas CANCER TO THE LIVER EXAM: RIGHT INTERNAL JUGULAR SINGLE LUMEN POWER PORT CATHETER INSERTION Date:  03/24/2023 03/24/2023 11:55 am Radiologist:   M. Ruel Favors, MD Guidance:  Ultrasound and fluoroscopic MEDICATIONS: 1% lidocaine local with epinephrine ANESTHESIA/SEDATION: Versed 0.5 mg IV; Fentanyl 25 mcg IV; Moderate Sedation Time:  30 minutes The patient was continuously monitored during the procedure by the interventional radiology nurse under my direct supervision. FLUOROSCOPY: 0 minutes, 71 seconds (3.4 mGy) COMPLICATIONS: None immediate. CONTRAST:  None. PROCEDURE: Informed consent was obtained from the patient following explanation of the procedure, risks, benefits and alternatives. The patient understands, agrees and consents for the procedure. All questions were addressed. A time out was performed. Maximal barrier sterile technique utilized including caps, mask, sterile gowns, sterile gloves, large sterile drape, hand hygiene, and 2% chlorhexidine scrub. Under sterile conditions and local anesthesia, right internal jugular micropuncture venous access was performed. Access was performed with ultrasound.  Images were obtained for documentation of the patent right internal jugular vein. A guide wire was inserted followed by a transitional dilator. This allowed insertion of a guide wire and catheter into the IVC. Measurements were obtained from the SVC / RA junction back to the right IJ venotomy site. In the right infraclavicular chest, a subcutaneous pocket was created over the second anterior rib. This was done under sterile conditions and local anesthesia. 1% lidocaine with epinephrine was utilized for this. A 2.5 cm incision was made in the skin. Blunt dissection was performed to create a subcutaneous pocket over the right pectoralis major muscle. The pocket was flushed with saline vigorously. There was adequate hemostasis. The port catheter was assembled and checked for leakage. The port catheter was secured in the pocket with two retention sutures. The tubing was tunneled subcutaneously to the right venotomy site and inserted into the SVC/RA  junction through a valved peel-away sheath. Position was confirmed with fluoroscopy. Images were obtained for documentation. The patient tolerated the procedure well. No immediate complications. Incisions were closed in a two layer fashion with 4 - 0 Vicryl suture. Dermabond was applied to the skin. The port catheter was accessed, blood was aspirated followed by saline and heparin flushes. Needle was removed. A dry sterile dressing was applied. IMPRESSION: Ultrasound and fluoroscopically guided right internal jugular single lumen power port catheter insertion. Tip in the SVC/RA junction. Catheter ready for use. Electronically Signed   By: Judie Petit.  Shick M.D.   On: 03/24/2023 12:17   DG Chest Port 1 View Result Date: 03/05/2023 CLINICAL DATA:  Shortness of breath.  Follow-up exam. EXAM: PORTABLE CHEST 1 VIEW COMPARISON:  Chest radiograph dated 02/27/2023. FINDINGS: Small right pleural effusion. Right lung base opacity as well as faint clusters of ground-glass density involving the right mid to lower lung field consistent with pneumonia. Overall interval worsening of pulmonary infiltrate since the radiograph of 02/27/2023. The left lung is clear. No pneumothorax. Stable cardiac silhouette. No acute osseous pathology. IMPRESSION: Interval worsening of right lung pneumonia. Electronically Signed   By: Elgie Collard M.D.   On: 03/05/2023 53:31   # 62 year old male patient with metastatic pancreatic cancer-on palliative chemotherapy admitted to hospital for worsening shortness of breath-CT scan concerning for pneumonia/pericardial effusion.  # Pancreatic cancer-on palliative chemotherapy status post 2 cycles of chemotherapy.  CT scan liver-decrease metastases.  # Shortness of breath-multifactorial bronchopneumonia/possible aspiration; symptomatic anemia pleural effusion and also large pericardial effusion.  # Diabetes-   # History of PE/DVT on Eliquis-currently on IV heparin.   Recommendations/plan:  # It  is understandable that patient's dyspnea is multifactorial-and patient still wants to continue aggressive chemotherapy options.  Given the recent decompensation-in the continued pericardial effusion-I discussed the option of pericardiocentesis with cardiology.  Dr. Okey Dupre kindly agrees to reevaluate the patient/echo-and consider pericardiocentesis.  If patient re-accumulate's pericardial fluid-would not recommend any aggressive measures like pericardial window, and then hospice/comfort measures would be recommended.  # I had a long discussion with the patient and daughter regarding overall seriousness of the situation.  Patient has been evaluated by palliative care multiple times-patient continues to be full code.  However unfortunately patient seems to be in denial.   # Thank you Dr.Amin for allowing me to participate in the care of your pleasant patient. Please do not hesitate to contact me with questions or concerns in the interim.  Also discussed with Dr. Okey Dupre; and also [employer].    Earna Coder, MD 04/04/2023 4:25 PM

## 2023-04-04 NOTE — Hospital Course (Addendum)
Brief Narrative:  62 y.o. male with medical history significant for metastatic pancreatic cancer on palliative chemotherapy, asthma, prostate cancer, type 2 diabetes mellitus, hypertension, dyslipidemia, and PE on Eliquis, who presented to the emergency room with acute onset of dyspnea with associated cough productive of yellow sputum without wheezing.  Today patient was found to have hemoglobin 6.5.  He agreed for blood transfusion however during transfusion he developed worsening shortness of breath with tachycardia.  Transfusion was aborted and blood return to blood bank for transfusion reaction workup. After extensive discussion for now we will patient place patient on Lovenox subcu every 12 hours as pericardiocentesis not planned.  In the meantime continue goals of care discussion.  Although he is at severe risk of aspiration, continue dysphagia diet and truly should be transition to comfort feeding if patient agreeable.  Declined SNF, wishes to go home with [facility].   Assessment & Plan:  Principal Problem:   Dyspnea Active Problems:   Malignant pericardial effusion   Essential hypertension   Asthma, chronic   Dyslipidemia   Pancreatic cancer metastasized to liver [facility]   History of pulmonary embolism   Type 2 diabetes mellitus without complications (HCC)   Anemia   Hemoptysis   Palliative care encounter   Pressure injury of skin   Community-acquired pneumonia Large pericardial effusion Pleural effusion and lung metastasis On Rocephin and azithromycin.  Supportive care.  Bronchodilators.  I-S/flutter valve. -CTA shows pneumonia but there is currently concerns of lymphangitic spread of his cancer. - After extensive discussion with cardiology team, holding off on pericardiocentesis at this time. Now on eliquis.   Dysphagia - Seen by speech and swallow and MBS performed.  Severe risk of aspiration.  Given his comorbidities, advised against artificial feeding.  Really recommend comfort  feeding at this point but until patient wants this we will proceed with dysphagia diet.   Essential hypertension Norvasc, Lopressor.    Acute anemia likely induced by chemo therapy Status post 1 unit blood transfusion.  Hemoglobin 8.5 Continue to monitor CBC closely   Type 2 diabetes mellitus without complications (HCC) Sliding scale and Accu-Chek.  Continue Jardiance 25 mg daily   History of pulmonary embolism as well as bilateral lower extremity DVT  on Eliquis.   Pancreatic cancer metastasized to liver [facility]) Follows outpatient oncology on outpatient chemotherapy   Dyslipidemia Continue statin   Asthma, chronic As needed bronchodilators  In my opinion overall may have poor prognosis given advanced and aggressive malignancy.  Patient understands this and currently wishes to be full code.  Medically DNR/DNI is recommended.  PT/OT-SNF; will consult TOC  DVT prophylaxis: Heparin drip > Lovenox Code Status: Full code Family Communication: Daughter at bedside Status is: Inpatient Remains inpatient appropriate because: Home HH   Subjective: No complaints., Still very weak. Wishes to go home with [facility]   Examination:  General exam: Appears calm and comfortable  Respiratory system: Clear to auscultation. Respiratory effort normal. Cardiovascular system: S1 & S2 heard, RRR. No JVD, murmurs, rubs, gallops or clicks. No pedal edema. Gastrointestinal system: Abdomen is nondistended, soft and nontender. No organomegaly or masses felt. Normal bowel sounds heard. Central nervous system: Alert and oriented. No focal neurological deficits. Extremities: Symmetric 5 x 5 power. Skin: No rashes, lesions or ulcers Psychiatry: Judgement and insight appear normal. Mood & affect appropriate. Stage II coccyx wound, POA

## 2023-04-04 NOTE — Progress Notes (Signed)
Physical Therapy Treatment Patient Details Name: Tony Benson MRN: [id number] DOB: 1960/08/16 Today's Date: 04/04/2023   History of Present Illness 62 y.o. male with PMHx: metastatic pancreatic cancer on palliative chemotherapy, asthma, prostate cancer, DM2, HTN, dyslipidemia, and PE on Eliquis, who presented with acute onset of dyspnea with associated cough productive of yellow sputum without wheezing. MD assessment: Community-acquired pneumonia  Large pericardial effusion, Pleural effusion and lung metastasis. Hep drip started 12/18 afternoon.    PT Comments  Pt was pleasant and motivated to participate during the session and put forth good effort throughout. Pt required extra time and effort with functional tasks but no physical assistance this session.  Pt was able to ambulate with very slow cadence and short B step length but was steady with no overt LOB and demonstrated fair to good eccentric and concentric control and stability with transfers.  Pt reported no adverse symptoms during the session other than R shoulder pain and made good overall progress towards goals this session.  Pt will benefit from continued PT services upon discharge to safely address deficits listed in patient problem list for decreased caregiver assistance and eventual return to PLOF.      If plan is discharge home, recommend the following: Assist for transportation;Help with stairs or ramp for entrance;A little help with walking and/or transfers;A little help with bathing/dressing/bathroom   Can travel by private vehicle     Yes  Equipment Recommendations  BSC/3in1;Rolling walker (2 wheels)    Recommendations for Other Services       Precautions / Restrictions Precautions Precautions: Fall Restrictions Weight Bearing Restrictions Per Provider Order: No     Mobility  Bed Mobility Overal bed mobility: Needs Assistance Bed Mobility: Supine to Sit     Supine to sit: Supervision, Used rails      General bed mobility comments: Min extra time and effort only along with use of the bed rail    Transfers Overall transfer level: Needs assistance Equipment used: Rolling walker (2 wheels) Transfers: Sit to/from Stand Sit to Stand: Contact guard assist           General transfer comment: Min verbal cues for hand placement with good eccentric and concentric control and stability    Ambulation/Gait Ambulation/Gait assistance: Contact guard assist Gait Distance (Feet): 30 Feet Assistive device: Rolling walker (2 wheels) Gait Pattern/deviations: Step-through pattern, Decreased step length - right, Decreased step length - left Gait velocity: decreased     General Gait Details: Slow cadence but steady without LOB   Stairs             Wheelchair Mobility     Tilt Bed    Modified Rankin (Stroke Patients Only)       Balance Overall balance assessment: Needs assistance Sitting-balance support: Feet supported Sitting balance-Leahy Scale: Good Sitting balance - Comments: steady seated at EOB   Standing balance support: Bilateral upper extremity supported, During functional activity Standing balance-Leahy Scale: Fair                              Cognition Arousal: Alert Behavior During Therapy: Flat affect Overall Cognitive Status: Within Functional Limits for tasks assessed                                          Exercises Total Joint Exercises Ankle Circles/Pumps: AROM,  Strengthening, Both, 10 reps Quad Sets: Strengthening, Both, 10 reps Gluteal Sets: Strengthening, Both, 5 reps Other Exercises Other Exercises: Pt education provided on benefits of OOB to chair Other Exercises: HEP education for BLE APs, QS, and GS x 10 each every 1-2 hours daily    General Comments        Pertinent Vitals/Pain Pain Assessment Pain Assessment: 0-10 Pain Score: 6  Pain Location: R shoulder Pain Descriptors / Indicators: Sore Pain  Intervention(s): Premedicated before session, Monitored during session    Home Living                          Prior Function            PT Goals (current goals can now be found in the care plan section) Progress towards PT goals: Progressing toward goals    Frequency    Min 1X/week      PT Plan      Co-evaluation              AM-PAC PT "6 Clicks" Mobility   Outcome Measure  Help needed turning from your back to your side while in a flat bed without using bedrails?: A Little Help needed moving from lying on your back to sitting on the side of a flat bed without using bedrails?: A Little Help needed moving to and from a bed to a chair (including a wheelchair)?: A Little Help needed standing up from a chair using your arms (e.g., wheelchair or bedside chair)?: A Little Help needed to walk in hospital room?: A Little Help needed climbing 3-5 steps with a railing? : A Lot 6 Click Score: 17    End of Session Equipment Utilized During Treatment: Gait belt Activity Tolerance: Patient tolerated treatment well Patient left: in chair;with call bell/phone within reach;with chair alarm set;with family/visitor present;with nursing/sitter in room Nurse Communication: Mobility status PT Visit Diagnosis: Unsteadiness on feet (R26.81);Other abnormalities of gait and mobility (R26.89);Muscle weakness (generalized) (M62.81);Difficulty in walking, not elsewhere classified (R26.2);Pain Pain - Right/Left: Right Pain - part of body: Shoulder     Time: 2130-8657 PT Time Calculation (min) (ACUTE ONLY): 24 min  Charges:    $Gait Training: 8-22 mins $Therapeutic Exercise: 8-22 mins PT General Charges $$ ACUTE PT VISIT: 1 Visit                     D. Scott Sesilia Poucher PT, DPT 04/04/23, 5:14 PM

## 2023-04-04 NOTE — TOC Progression Note (Addendum)
Transition of Care [facility]) - Progression Note    Patient Details  Name: Tony Benson MRN: [id number] Date of Birth: 08-Jan-1961  Transition of Care [facility]) CM/SW Contact  Allena Katz, LCSW Phone Number: 04/04/2023, 11:06 AM  Clinical Narrative:   [name] unable to take. [facility] unable to take.   Frances Furbish unable to take. [facility] unable to take [facility] unable to take Message sent to sarah with [facility].   Expected Discharge Plan: Home w Home Health Services Barriers to Discharge: Continued Medical Work up  Expected Discharge Plan and Services     Post Acute Care Choice: Home Health Living arrangements for the past 2 months: Single Family Home                                       Social Determinants of Health (SDOH) Interventions SDOH Screenings   Food Insecurity: No Food Insecurity (04/02/2023)  Housing: Unknown (04/02/2023)  Transportation Needs: No Transportation Needs (04/02/2023)  Utilities: Not At Risk (04/02/2023)  Depression (PHQ2-9): Low Risk  (02/24/2023)  Tobacco Use: Low Risk  (04/02/2023)    Readmission Risk Interventions    04/04/2023    9:56 AM  Readmission Risk Prevention Plan  Transportation Screening Complete  HRI or Home Care Consult Complete  Social Work Consult for Recovery Care Planning/Counseling Complete  Palliative Care Screening Complete  Medication Review Oceanographer) Complete

## 2023-04-04 NOTE — TOC Initial Note (Signed)
Transition of Care [facility]) - Initial/Assessment Note    Patient Details  Name: Tony Benson MRN: [id number] Date of Birth: 02/14/1961  Transition of Care [facility]) CM/SW Contact:    Allena Katz, LCSW Phone Number: 04/04/2023, 9:58 AM  Clinical Narrative:    CSW spoke with patient regarding rehab recommendation. Pt states he does not want to go to rehab. He states he was independent prior to admission. He had a walker that he uses and an adjustable bed. Pt reports wife works from home and is able to assist. He says he has not walked here except with nursing and it took two people to get him up. Pt does not want to go to rehab. He understands he would have to stop chemo treatments. He wants to do home health instead. He is agreeable to getting a BSC. He says he is active with his PCP. Daughter at bedside agreeable. Pt has not had HH prior and states he does not have a preference.               Expected Discharge Plan: Home w Home Health Services Barriers to Discharge: Continued Medical Work up   Patient Goals and CMS Choice     Choice offered to / list presented to : Patient      Expected Discharge Plan and Services     Post Acute Care Choice: Home Health Living arrangements for the past 2 months: Single Family Home                                      Prior Living Arrangements/Services Living arrangements for the past 2 months: Single Family Home Lives with:: Spouse Patient language and need for interpreter reviewed:: Yes Do you feel safe going back to the place where you live?: Yes      Need for Family Participation in Patient Care: Yes (Comment) Care giver support system in place?: Yes (comment) Current home services: DME (RW)    Activities of Daily Living   ADL Screening (condition at time of admission) Independently performs ADLs?: No Does the patient have a NEW difficulty with bathing/dressing/toileting/self-feeding that is expected to last >3 days?: Yes  (Initiates electronic notice to provider for possible OT consult) Does the patient have a NEW difficulty with getting in/out of bed, walking, or climbing stairs that is expected to last >3 days?: Yes (Initiates electronic notice to provider for possible PT consult) Does the patient have a NEW difficulty with communication that is expected to last >3 days?: No Is the patient deaf or have difficulty hearing?: No Does the patient have difficulty seeing, even when wearing glasses/contacts?: No Does the patient have difficulty concentrating, remembering, or making decisions?: No  Permission Sought/Granted      Share Information with NAME: Home health  Permission granted to share info w AGENCY: home health        Emotional Assessment       Orientation: : Oriented to Self, Oriented to  Time, Oriented to Situation, Oriented to Place   Psych Involvement: No (comment)  Admission diagnosis:  Dyspnea [R06.00] Anemia [D64.9] Hemoptysis [R04.2] Patient Active Problem List   Diagnosis Date Noted   Hemoptysis 04/03/2023   Palliative care encounter 04/03/2023   Pressure injury of skin 04/03/2023   Dyspnea 04/01/2023   History of pulmonary embolism 04/01/2023   Type 2 diabetes mellitus without complications (HCC) 04/01/2023   Anemia 04/01/2023  Anxiety 03/07/2023   Elevated LFTs 03/06/2023   Uncontrolled type 2 diabetes mellitus with hyperglycemia, with long-term current use of insulin (HCC) 03/05/2023   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/05/2023   Tachycardia 03/05/2023   Normocytic anemia 03/05/2023   Pancreatic cancer metastasized to liver (HCC) 03/05/2023   Protein-calorie malnutrition, severe 03/04/2023   Mass of head of pancreas 03/01/2023   Acute pulmonary embolism (HCC) 02/28/2023   Sepsis due to pneumonia (HCC) 02/28/2023   Essential hypertension 02/28/2023   Dyslipidemia 02/28/2023   Asthma, chronic, unspecified asthma severity, with acute exacerbation 02/28/2023   Weight  loss, abnormal 02/24/2023   Localized swelling of both lower legs 02/24/2023   Elevated vitamin B12 level 02/24/2023   History of colonic polyps 02/05/2023   Family history of colon cancer in mother 02/05/2023   Preoperative evaluation to rule out surgical contraindication 08/17/2021   Diabetes mellitus without complication (HCC) 05/21/2021   Prostate cancer (HCC) 05/21/2021   Chronic right shoulder pain 02/24/2019   Chronic myofascial pain 02/24/2019   Family history of malignant neoplasm of gastrointestinal tract    Polyp of transverse colon    Rectal polyp    Diverticulosis of large intestine without diverticulitis    Cervical facet joint syndrome (C2,3,4,5) 02/05/2018   Arthropathy of cervical facet joint 02/05/2018   Cervicalgia 02/05/2018   Chronic pain syndrome 02/05/2018   Hypertension    Cellulitis and abscess of right thigh s/p I&D 09/24/2017 09/24/2017   Asthma, chronic 08/04/2013   PCP:  Sherrie Mustache, MD Pharmacy:   [facility] [phone] [place], [place] - [address] [place] [address] Phone: [phone] Fax: [phone]  [id number] [place], [place] - [address] AT [facility] [address] [place] [address] Phone: [phone] Fax: [phone]     Social Drivers of Health (SDOH) Social History: SDOH Screenings   Food Insecurity: No Food Insecurity (04/02/2023)  Housing: Unknown (04/02/2023)  Transportation Needs: No Transportation Needs (04/02/2023)  Utilities: Not At Risk (04/02/2023)  Depression (PHQ2-9): Low Risk  (02/24/2023)  Tobacco Use: Low Risk  (04/02/2023)   SDOH Interventions:     Readmission Risk Interventions    04/04/2023    9:56 AM  Readmission Risk Prevention Plan  Transportation Screening Complete  HRI or Home Care Consult Complete  Social Work Consult for Recovery Care Planning/Counseling Complete  Palliative Care Screening Complete  Medication  Review Oceanographer) Complete

## 2023-04-04 NOTE — Plan of Care (Signed)
Pt alert and oriented, receiving dilaudid PO prn for pain control. Pt on oxygen 2L/[place] acute. Pt and family refusing pericardiocentesis and on dysphagia 3 diet thin liquids with risk for aspiration and understands that risk but still wants to eat and drink.  Problem: Education: Goal: Knowledge of General Education information will improve Description: Including pain rating scale, medication(s)/side effects and non-pharmacologic comfort measures Outcome: Not Progressing   Problem: Health Behavior/Discharge Planning: Goal: Ability to manage health-related needs will improve Outcome: Not Progressing   Problem: Clinical Measurements: Goal: Ability to maintain clinical measurements within normal limits will improve Outcome: Not Progressing Goal: Will remain free from infection Outcome: Not Progressing Goal: Diagnostic test results will improve Outcome: Not Progressing Goal: Respiratory complications will improve Outcome: Not Progressing Goal: Cardiovascular complication will be avoided Outcome: Not Progressing   Problem: Activity: Goal: Risk for activity intolerance will decrease Outcome: Not Progressing   Problem: Nutrition: Goal: Adequate nutrition will be maintained Outcome: Not Progressing   Problem: Coping: Goal: Level of anxiety will decrease Outcome: Not Progressing   Problem: Elimination: Goal: Will not experience complications related to bowel motility Outcome: Not Progressing Goal: Will not experience complications related to urinary retention Outcome: Not Progressing   Problem: Pain Management: Goal: General experience of comfort will improve Outcome: Not Progressing   Problem: Safety: Goal: Ability to remain free from injury will improve Outcome: Not Progressing   Problem: Skin Integrity: Goal: Risk for impaired skin integrity will decrease Outcome: Not Progressing   Problem: Education: Goal: Ability to describe self-care measures that may prevent or  decrease complications (Diabetes Survival Skills Education) will improve Outcome: Not Progressing Goal: Individualized Educational Video(s) Outcome: Not Progressing   Problem: Coping: Goal: Ability to adjust to condition or change in health will improve Outcome: Not Progressing   Problem: Fluid Volume: Goal: Ability to maintain a balanced intake and output will improve Outcome: Not Progressing   Problem: Health Behavior/Discharge Planning: Goal: Ability to identify and utilize available resources and services will improve Outcome: Not Progressing Goal: Ability to manage health-related needs will improve Outcome: Not Progressing   Problem: Metabolic: Goal: Ability to maintain appropriate glucose levels will improve Outcome: Not Progressing   Problem: Nutritional: Goal: Maintenance of adequate nutrition will improve Outcome: Not Progressing Goal: Progress toward achieving an optimal weight will improve Outcome: Not Progressing   Problem: Skin Integrity: Goal: Risk for impaired skin integrity will decrease Outcome: Not Progressing   Problem: Tissue Perfusion: Goal: Adequacy of tissue perfusion will improve Outcome: Not Progressing

## 2023-04-05 DIAGNOSIS — R0600 Dyspnea, unspecified: Secondary | ICD-10-CM | POA: Diagnosis not present

## 2023-04-05 LAB — GLUCOSE, CAPILLARY
Glucose-Capillary: 196 mg/dL — ABNORMAL HIGH (ref 70–99)
Glucose-Capillary: 283 mg/dL — ABNORMAL HIGH (ref 70–99)
Glucose-Capillary: 108 mg/dL — ABNORMAL HIGH (ref 70–99)

## 2023-04-05 LAB — CBC
HCT: 29.3 % — ABNORMAL LOW (ref 39.0–52.0)
Hemoglobin: 8.9 g/dL — ABNORMAL LOW (ref 13.0–17.0)
MCH: 25.6 pg — ABNORMAL LOW (ref 26.0–34.0)
MCHC: 30.4 g/dL (ref 30.0–36.0)
MCV: 84.4 fL (ref 80.0–100.0)
Platelets: 252 10*3/uL (ref 150–400)
RBC: 3.47 MIL/uL — ABNORMAL LOW (ref 4.22–5.81)
RDW: 18.2 % — ABNORMAL HIGH (ref 11.5–15.5)
WBC: 9.1 10*3/uL (ref 4.0–10.5)
nRBC: 0 % (ref 0.0–0.2)

## 2023-04-05 LAB — BASIC METABOLIC PANEL
Anion gap: 6 (ref 5–15)
BUN: 15 mg/dL (ref 8–23)
CO2: 23 mmol/L (ref 22–32)
Calcium: 8.6 mg/dL — ABNORMAL LOW (ref 8.9–10.3)
Chloride: 109 mmol/L (ref 98–111)
Creatinine, Ser: 0.46 mg/dL — ABNORMAL LOW (ref 0.61–1.24)
GFR, Estimated: >60 mL/min (ref >60)
Glucose, Bld: 158 mg/dL — ABNORMAL HIGH (ref 70–99)
Potassium: 3.6 mmol/L (ref 3.5–5.1)
Sodium: 138 mmol/L (ref 135–145)

## 2023-04-05 LAB — MAGNESIUM: Magnesium: 2.1 mg/dL (ref 1.7–2.4)

## 2023-04-05 MED ORDER — APIXABAN 5 MG PO TABS: 5.0000 mg | ORAL_TABLET | Freq: Two times a day (BID) | ORAL | Status: DC

## 2023-04-05 MED ORDER — MAGNESIUM CITRATE PO SOLN
1.0000 | Freq: Once | ORAL | Status: AC
  Administered 2023-04-05: 1 via ORAL
  Filled 2023-04-05: qty 296

## 2023-04-05 NOTE — Discharge Summary (Signed)
Physician Discharge Summary  TARRIN WEIDEL HQI:[id number] DOB: 06-07-1960 DOA: 03/31/2023  PCP: Sherrie Mustache, MD  Admit date: 03/31/2023 Discharge date: 04/05/2023  Admitted From: Home Disposition:  Home HH  Recommendations for Outpatient Follow-up:  Follow up with PCP in 1-2 weeks Please obtain BMP/CBC in one week your next doctors visit.  Eliquis BID Recommend to continue outpatient following with palliative care services and oncology  Home health services to be arranged Home health equipments-bedside commode, walker and potential oral suctioning if possible Discharge Condition: Stable CODE STATUS: Full code Diet recommendation: Regular   Brief Narrative:  62 y.o. male with medical history significant for metastatic pancreatic cancer on palliative chemotherapy, asthma, prostate cancer, type 2 diabetes mellitus, hypertension, dyslipidemia, and PE on Eliquis, who presented to the emergency room with acute onset of dyspnea with associated cough productive of yellow sputum without wheezing.  Today patient was found to have hemoglobin 6.5.  He agreed for blood transfusion however during transfusion he developed worsening shortness of breath with tachycardia.  Transfusion was aborted and blood return to blood bank for transfusion reaction workup. After extensive discussion for now we will patient place patient on Lovenox subcu every 12 hours as pericardiocentesis not planned.  In the meantime continue goals of care discussion.  Although he is at severe risk of aspiration, continue dysphagia diet and truly should be transition to comfort feeding if patient agreeable.  Declined SNF, wishes to go home with [facility].   Assessment & Plan:  Principal Problem:   Dyspnea Active Problems:   Malignant pericardial effusion   Essential hypertension   Asthma, chronic   Dyslipidemia   Pancreatic cancer metastasized to liver [facility])   History of pulmonary embolism   Type 2 diabetes mellitus without  complications (HCC)   Anemia   Hemoptysis   Palliative care encounter   Pressure injury of skin   Community-acquired pneumonia Large pericardial effusion Pleural effusion and lung metastasis On Rocephin and azithromycin.  Supportive care.  Bronchodilators.  I-S/flutter valve. -CTA shows pneumonia but there is currently concerns of lymphangitic spread of his cancer. - After extensive discussion with cardiology team, holding off on pericardiocentesis at this time. Now on eliquis.   Dysphagia - Seen by speech and swallow and MBS performed.  Severe risk of aspiration.  Given his comorbidities, advised against artificial feeding.  Really recommend comfort feeding at this point but until patient wants this we will proceed with dysphagia diet.   Essential hypertension Norvasc, Lopressor.    Acute anemia likely induced by chemo therapy Status post 1 unit blood transfusion.  Hemoglobin 8.5 Continue to monitor CBC closely   Type 2 diabetes mellitus without complications (HCC) Sliding scale and Accu-Chek.  Continue Jardiance 25 mg daily   History of pulmonary embolism as well as bilateral lower extremity DVT  on Eliquis.   Pancreatic cancer metastasized to liver [facility]) Follows outpatient oncology on outpatient chemotherapy   Dyslipidemia Continue statin   Asthma, chronic As needed bronchodilators  In my opinion overall may have poor prognosis given advanced and aggressive malignancy.  Patient understands this and currently wishes to be full code.  Medically DNR/DNI is recommended.  PT/OT-SNF; will consult TOC  DVT prophylaxis: Heparin drip > Lovenox Code Status: Full code Family Communication: Daughter at bedside Status is: Inpatient Remains inpatient appropriate because: Home HH   Subjective: No complaints., Still very weak. Wishes to go home with [facility]   Examination:  General exam: Appears calm and comfortable  Respiratory system: Clear to auscultation.  Respiratory effort  normal. Cardiovascular system: S1 & S2 heard, RRR. No JVD, murmurs, rubs, gallops or clicks. No pedal edema. Gastrointestinal system: Abdomen is nondistended, soft and nontender. No organomegaly or masses felt. Normal bowel sounds heard. Central nervous system: Alert and oriented. No focal neurological deficits. Extremities: Symmetric 5 x 5 power. Skin: No rashes, lesions or ulcers Psychiatry: Judgement and insight appear normal. Mood & affect appropriate. Stage II coccyx wound, POA   Discharge Diagnoses:  Principal Problem:   Dyspnea Active Problems:   Pericardial effusion   Essential hypertension   Asthma, chronic   Dyslipidemia   Pancreatic cancer metastasized to liver [facility])   History of pulmonary embolism   Type 2 diabetes mellitus without complications (HCC)   Anemia   Hemoptysis   Palliative care encounter   Pressure injury of skin      Discharge Exam: Vitals:   04/05/23 0432 04/05/23 0815  BP: 110/62 (!) 105/59  Pulse:  (!) 106  Resp:  20  Temp:  98.2 F (36.8 C)  SpO2:  99%   Vitals:   04/05/23 0012 04/05/23 0409 04/05/23 0432 04/05/23 0815  BP: (!) 101/56 (!) 97/53 110/62 (!) 105/59  Pulse: 89 100  (!) 106  Resp: 18 18  20   Temp: 99.7 F (37.6 C) 98.4 F (36.9 C)  98.2 F (36.8 C)  TempSrc:    Oral  SpO2: 95% 92%  99%  Weight:      Height:        Discharge Instructions   Allergies as of 04/05/2023       Reactions   Other Anaphylaxis   peanuts   Morphine And Codeine Itching   Tree Extract Other (See Comments)   ALMONDS  ---- EYE/ FACIAL SWELLING   Wound Dressing Adhesive Rash        Medication List     TAKE these medications    acetaminophen 500 MG tablet Commonly known as: TYLENOL Take 500 mg by mouth every 6 (six) hours as needed.   ALPRAZolam 0.5 MG tablet Commonly known as: XANAX Take 1 tablet (0.5 mg total) by mouth 2 (two) times daily as needed for anxiety.   amLODipine 10 MG tablet Commonly known as: NORVASC Take 10  mg by mouth daily.   apixaban 5 MG Tabs tablet Commonly known as: ELIQUIS Take 2 tablets (10 mg total) by mouth 2 (two) times daily for 5 days, THEN 1 tablet (5 mg total) 2 (two) times daily for 25 days. Start taking on: March 07, 2023   atorvastatin 10 MG tablet Commonly known as: LIPITOR Take 10 mg by mouth daily.   Dulera 100-5 MCG/ACT Aero Generic drug: mometasone-formoterol Inhale 2 puffs into the lungs 2 (two) times daily as needed for wheezing or shortness of breath.   feeding supplement Liqd Take 237 mLs by mouth 2 (two) times daily between meals.   fentaNYL 12 MCG/HR Commonly known as: DURAGESIC Place 1 patch onto the skin every 3 (three) days.   Fish Oil 1000 MG Caps Take 1,000 mg by mouth daily.   HYDROmorphone 4 MG tablet Commonly known as: Dilaudid Take 1 tablet (4 mg total) by mouth every 4 (four) hours as needed for severe pain (pain score 7-10).   HYDROmorphone 2 MG tablet Commonly known as: Dilaudid Take 2 tablets (4 mg total) by mouth every 4 (four) hours as needed for severe pain (pain score 7-10).   insulin aspart 100 UNIT/ML FlexPen Commonly known as: NOVOLOG Inject 5 Units into the  skin 3 (three) times daily with meals. If eating and Blood Glucose (BG) 80 or higher inject 5 units for meal coverage and add correction dose per scale. If not eating, correction dose only. BG <150= 0 unit; BG 150-200= 1 unit; BG 201-250= 2 unit; BG 251-300= 3 unit; BG 301-350= 4 unit; BG 351-400= 5 unit; BG >400= 6 unit and Call Primary care.   Jardiance 25 MG Tabs tablet Generic drug: empagliflozin Take 25 mg by mouth daily.   lidocaine-prilocaine cream Commonly known as: EMLA Apply on the port. 30 -45 min  prior to port access.   losartan 100 MG tablet Commonly known as: COZAAR Take 100 mg by mouth daily.   metoprolol tartrate 50 MG tablet Commonly known as: LOPRESSOR Take 1 tablet (50 mg total) by mouth 2 (two) times daily.   naloxone 4 MG/0.1ML Liqd nasal  spray kit Commonly known as: NARCAN One spray in nostril for unresponsiveness   OLANZapine 5 MG tablet Commonly known as: ZyPREXA Take 1 tablet (5 mg total) by mouth at bedtime.   ondansetron 8 MG tablet Commonly known as: ZOFRAN One pill every 8 hours as needed for nausea/vomitting.   prochlorperazine 10 MG tablet Commonly known as: COMPAZINE Take 1 tablet (10 mg total) by mouth every 6 (six) hours as needed for nausea or vomiting.   Rybelsus 7 MG Tabs Generic drug: Semaglutide Take 1 tablet by mouth daily.               Durable Medical Equipment  (From admission, onward)           Start     Ordered   04/05/23 1008  For home use only DME Suction  Once       Question:  Suction  Answer:  Oral   04/05/23 1007   04/04/23 1713  For home use only DME 4 wheeled rolling walker with seat  Once       Question:  Patient needs a walker to treat with the following condition  Answer:  Ambulatory dysfunction   04/04/23 1712   04/04/23 1713  For home use only DME Bedside commode  Once       Question:  Patient needs a bedside commode to treat with the following condition  Answer:  Ambulatory dysfunction   04/04/23 1712            Follow-up Information     Sherrie Mustache, MD Follow up.   Specialty: Internal Medicine Why: Hospital follow up Contact information: [address] [place] [address] [phone]         Sherrie Mustache, MD Follow up in 1 week(s).   Specialty: Internal Medicine Contact information: [address] [place] [address] [phone]                Allergies  Allergen Reactions   Other Anaphylaxis    peanuts   Morphine And Codeine Itching   Tree Extract Other (See Comments)    ALMONDS  ---- EYE/ FACIAL SWELLING   Wound Dressing Adhesive Rash    You were cared for by a hospitalist during your hospital stay. If you have any questions about your discharge medications or the care you received while you were  in the hospital after you are discharged, you can call the unit and asked to speak with the hospitalist on call if the hospitalist that took care of you is not available. Once you are discharged, your primary care physician will handle  any further medical issues. Please note that no refills for any discharge medications will be authorized once you are discharged, as it is imperative that you return to your primary care physician (or establish a relationship with a primary care physician if you do not have one) for your aftercare needs so that they can reassess your need for medications and monitor your lab values.  You were cared for by a hospitalist during your hospital stay. If you have any questions about your discharge medications or the care you received while you were in the hospital after you are discharged, you can call the unit and asked to speak with the hospitalist on call if the hospitalist that took care of you is not available. Once you are discharged, your primary care physician will handle any further medical issues. Please note that NO REFILLS for any discharge medications will be authorized once you are discharged, as it is imperative that you return to your primary care physician (or establish a relationship with a primary care physician if you do not have one) for your aftercare needs so that they can reassess your need for medications and monitor your lab values.  Please request your Prim.MD to go over all Hospital Tests and Procedure/Radiological results at the follow up, please get all Hospital records sent to your Prim MD by signing hospital release before you go home.  Get CBC, CMP, 2 view Chest X ray checked  by Primary MD during your next visit or SNF MD in 5-7 days ( we routinely change or add medications that can affect your baseline labs and fluid status, therefore we recommend that you get the mentioned basic workup next visit with your PCP, your PCP may decide not to get them or  add new tests based on their clinical decision)  On your next visit with your primary care physician please Get Medicines reviewed and adjusted.  If you experience worsening of your admission symptoms, develop shortness of breath, life threatening emergency, suicidal or homicidal thoughts you must seek medical attention immediately by calling 911 or calling your MD immediately  if symptoms less severe.  You Must read complete instructions/literature along with all the possible adverse reactions/side effects for all the Medicines you take and that have been prescribed to you. Take any new Medicines after you have completely understood and accpet all the possible adverse reactions/side effects.   Do not drive, operate heavy machinery, perform activities at heights, swimming or participation in water activities or provide baby sitting services if your were admitted for syncope or siezures until you have seen by Primary MD or a Neurologist and advised to do so again.  Do not drive when taking Pain medications.   Procedures/Studies: DG Swallowing Func-Speech Pathology Result Date: 04/04/2023 Table formatting from the original result was not included. Modified Barium Swallow Study Patient Details Name: TANYON SAMS MRN: [id number] Date of Birth: 31-Jul-1960 Today's Date: 04/04/2023 HPI/PMH: HPI: Pt is a 62 y.o. male with medical history significant for Metastatic pancreatic Cancer on Palliative Chemotherapy, asthma, prostate Cancer, type 2 diabetes mellitus, hypertension, dyslipidemia, and PE on Eliquis, who presented to the emergency room with acute onset of dyspnea with associated cough productive of yellow sputum without wheezing.  Today patient was found to have hemoglobin 6.5.  He agreed for blood transfusion however during transfusion he developed worsening shortness of breath with tachycardia.  Transfusion was aborted and blood return to blood bank for transfusion reaction workup.  Patient has also  been  seen by cardiologist and awaiting repeat echo before decision is made on Large pericardial effusion, lung metastasis per MD note.  Per Oncology note: Patient was hospitalized November 2024 with shortness of breath/acute PE, bilateral DVT.  CT of the abdomen and pelvis found to have a pancreatic head mass and liver masses.  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  Patient is status post 2 cycles of gemcitabine and Abraxane.  Now admitted to the hospital with pneumonia.  Palliative Care was consulted for GOC.   Chest CT: Large pericardial effusion increased compared to 02/27/2023.  Clinical correlation recommended to exclude tamponade physiology.  3. Increased bilateral centrilobular micro nodules and tree-in-bud  opacities with more confluence opacities in the right lung, likely  due to bronchopneumonia from aspiration. Lymphangitic spread of  tumor could appear similarly.  4. Irregular pleural thickening or loculated pleural effusion in the  lower right chest has increased compared to 02/27/2023. There is  associated pleural nodularity in the posterior lower lobe. This may be due to bronchopneumonia though metastases are not excluded. Clinical Impression: Clinical Impression: Pt presents with profound oropharyngeal dysphagia that is likely related to overall deconditioned state. As such he is at a very high risk of aspiration, malnutrition and dehydration when consuming POs. Specifically, when consuming thin liquids via spoon, nectar thick liquids via spoon and small cup sips as well as small boluses of puree, he presents with sensed and silent aspiration of all consistencies. When aspiration is sensed, he produced a delayed weak non-productive cough that was not effective in clearing aspirates or laryngeal penetrates. As such, would recommend NPO and discussion regarding GOC. Recommend extensive oral care to reduce the bacterial load of pt's salvia as his risk of  aspirating his own salvia is high.     Analysis of swallow function:  Oral phase is c/b decrease propulsion of boluses over base of tongue  Pharyngeal phase is c/b decreased base of tongue strength, weak hyoid movement, decreased epiglottic deflection, delayed swallow initiation to leave of pyriform sinuses and weak pharyngeal parastasis resulting in reduced UES opening and passage of boluses as well s gross widespread pharyngeal residue throughout pharynx. Boluses were observed to be resting within the laryngeal vestibule and on pt's vocal cords with no ability to clear these penetrates even with a cued cough. Aspiration was gross when consuming thin liquids via spoon d/t delayed pharyngeal swallow. While less of the nectar and puree were aspirated, pharyngeal residue was greater resulting in continued build-up of penetrates. Factors that may increase risk of adverse event in presence of aspiration Rubye Oaks [employer] Clearance Coots 2021): Factors that may increase risk of adverse event in presence of aspiration Rubye Oaks [employer] Clearance Coots 2021): Poor general health and/or compromised immunity; Reduced cognitive function; Limited mobility; Frail or deconditioned; Dependence for feeding and/or oral hygiene; Inadequate oral hygiene; Reduced saliva; Weak cough; Frequent aspiration of large volumes Recommendations/Plan: Swallowing Evaluation Recommendations Swallowing Evaluation Recommendations Recommendations: NPO Medication Administration: Via alternative means Oral care recommendations: Oral care QID (4x/day) Recommended consults: Consider Palliative care Caregiver Recommendations: -- (TBD) Treatment Plan Treatment Plan Treatment recommendations: No treatment recommended at this time Follow-up recommendations: No SLP follow up Recommendations Comment: Palliative Care f/u for GOC Recommendations Recommendations for follow up therapy are one component of a multi-disciplinary discharge planning process, led by the attending physician.   Recommendations may be updated based on patient status, additional functional criteria and insurance authorization. Assessment: Orofacial Exam: Orofacial Exam Oral Cavity: Oral Hygiene: Xerostomia Oral Cavity -  Dentition: Adequate natural dentition Orofacial Anatomy: WFL Oral Motor/Sensory Function: WFL Anatomy: Anatomy: WFL Boluses Administered: Boluses Administered Boluses Administered: Thin liquids (Level 0); Mildly thick liquids (Level 2, nectar thick); Puree  Oral Impairment Domain: Oral Impairment Domain Lip Closure: No labial escape Tongue control during bolus hold: Not tested Bolus preparation/mastication: Slow prolonged chewing/mashing with complete recollection Bolus transport/lingual motion: Delayed initiation of tongue motion (oral holding); Slow tongue motion Oral residue: Residue collection on oral structures Location of oral residue : Floor of mouth; Tongue Initiation of pharyngeal swallow : Pyriform sinuses  Pharyngeal Impairment Domain: Pharyngeal Impairment Domain Soft palate elevation: No bolus between soft palate (SP)/pharyngeal wall (PW) Laryngeal elevation: Partial superior movement of thyroid cartilage/partial approximation of arytenoids to epiglottic petiole Anterior hyoid excursion: Partial anterior movement; No anterior movement Epiglottic movement: Partial inversion; No inversion Laryngeal vestibule closure: None, wide column air/contrast in laryngeal vestibule; Incomplete, narrow column air/contrast in laryngeal vestibule Pharyngeal stripping wave : Present - diminished Pharyngoesophageal segment opening: Minimal distention/minimal duration, marked obstruction of flow; Partial distention/partial duration, partial obstruction of flow Tongue base retraction: Narrow column of contrast or air between tongue base and PPW; Wide column of contrast or air between tongue base and PPW Pharyngeal residue: Majority of contrast within or on pharyngeal structures Location of pharyngeal residue: Tongue  base; Valleculae; Pharyngeal wall; Pyriform sinuses  Esophageal Impairment Domain: Esophageal Impairment Domain Esophageal clearance upright position: Complete clearance, esophageal coating Pill: No data recorded Penetration/Aspiration Scale Score: Penetration/Aspiration Scale Score 7.  Material enters airway, passes BELOW cords and not ejected out despite cough attempt by patient: Thin liquids (Level 0); Mildly thick liquids (Level 2, nectar thick); Puree 8.  Material enters airway, passes BELOW cords without attempt by patient to eject out (silent aspiration) : Thin liquids (Level 0); Mildly thick liquids (Level 2, nectar thick); Puree Compensatory Strategies: No data recorded  General Information: Caregiver present: No  Diet Prior to this Study: Dysphagia 3 (mechanical soft); Thin liquids (Level 0)   Temperature : Normal   Respiratory Status: WFL   Supplemental O2: None (Room air)   History of Recent Intubation: No  Behavior/Cognition: Alert; Cooperative; Distractible; Requires cueing Self-Feeding Abilities: Needs assist with self-feeding Baseline vocal quality/speech: Normal Volitional Cough: Able to elicit Volitional Swallow: Able to elicit Exam Limitations: No limitations Goal Planning: Prognosis for improved oropharyngeal function: -- (Poor) Barriers to Reach Goals: Cognitive deficits; Time post onset; Severity of deficits; Overall medical prognosis Barriers/Prognosis Comment: Frail; Drowsy; Pain/pain meds; Cancer w/ Metastasis Patient/Family Stated Goal: none stated Consulted and agree with results and recommendations: Physician; Nurse Pain: Pain Assessment Pain Assessment: Faces Faces Pain Scale: 2 Pain Location: R shoulder; BLEs Pain Descriptors / Indicators: Aching Pain Intervention(s): Monitored during session; Premedicated before session End of Session: Start Time:SLP Start Time (ACUTE ONLY): 1430 Stop Time: SLP Stop Time (ACUTE ONLY): 1450 Time Calculation:SLP Time Calculation (min) (ACUTE ONLY): 20  min Charges: SLP Evaluations $ SLP Speech Visit: 1 Visit SLP Evaluations $BSS Swallow: 1 Procedure $MBS Swallow: 1 Procedure $Self Care/Home Management: 8-22 SLP visit diagnosis: SLP Visit Diagnosis: Dysphagia, oropharyngeal phase (R13.12) Past Medical History: Past Medical History: Diagnosis Date  Asthma   Cancer (HCC)   prostate  Diabetes mellitus without complication (HCC)   type II  Hyperlipidemia   Hypertension   Sleep apnea   uses Cpap machine  Past Surgical History: Past Surgical History: Procedure Laterality Date  CHOLECYSTECTOMY    COLONOSCOPY WITH PROPOFOL N/A 11/18/2018  Procedure: COLONOSCOPY WITH PROPOFOL;  Surgeon: Melodie Bouillon  B, MD;  Location: [facility] ENDOSCOPY;  Service: Gastroenterology;  Laterality: N/A;  COLONOSCOPY WITH PROPOFOL N/A 02/05/2023  Procedure: COLONOSCOPY WITH PROPOFOL;  Surgeon: Toney Reil, MD;  Location: [facility] ENDOSCOPY;  Service: Gastroenterology;  Laterality: N/A;  IR IMAGING GUIDED PORT INSERTION  03/24/2023  IR US LIVER BIOPSY  03/03/2023  LAPAROSCOPIC RETROPUBIC PROSTATECTOMY    2023  LUNG SURGERY    "ligation of thoracic duct"  PROSTATE BIOPSY N/A 09/21/2020  Procedure: PROSTATE BIOPSY [name];  Surgeon: [name], MD;  Location: [facility] ORS;  Service: Urology;  Laterality: N/A; Happi Overton 04/04/2023, 3:25 PM  US Venous Img Lower Bilateral (DVT) Result Date: 04/02/2023 CLINICAL DATA:  BILATERAL lower extremity pain times months. [id number] Lower extremity pain [id number] EXAM: BILATERAL LOWER EXTREMITY VENOUS DOPPLER ULTRASOUND TECHNIQUE: Gray-scale sonography with graded compression, as well as color Doppler and duplex ultrasound were performed to evaluate the lower extremity deep venous systems from the level of the common femoral vein and including the common femoral, femoral, profunda femoral, popliteal and calf veins including the posterior tibial, peroneal and gastrocnemius veins when visible. The superficial great saphenous vein was also interrogated.  Spectral Doppler was utilized to evaluate flow at rest and with distal augmentation maneuvers in the common femoral, femoral and popliteal veins. COMPARISON:  Lower extremity venous duplex, most recently 03/25/2023, POSITIVE for BILATERAL lower extremity DVT of the RIGHT femoral and LEFT popliteal veins. FINDINGS: RIGHT LOWER EXTREMITY VENOUS Normal compressibility of the RIGHT common femoral and visualized portions of profunda femoral vein and great saphenous veins. Similar appearance of near-occlusive filling defect involving the RIGHT femoral, popliteal and visualized portions of the calf veins OTHER No evidence of superficial thrombophlebitis or abnormal fluid collection. Limitations: none LEFT LOWER EXTREMITY VENOUS Normal compressibility of the LEFT common femoral, superficial femoral, profunda femoral vein and great saphenous veins. Similar appearance of near-occlusive filling defect within the LEFT popliteal and visualized portions of the calf veins. OTHER No evidence of superficial thrombophlebitis or abnormal fluid collection. Limitations: none IMPRESSION: Since lower extremity venous duplex dated 03/25/2023; 1. No new or worsening DVT within either lower extremity. 2. Similar appearance of BILATERAL lower extremity DVT, extending from the RIGHT femoral through the calf veins and LEFT popliteal through the calf veins Roanna Banning, MD [facility] Radiology Electronically Signed   By: Roanna Banning M.D.   On: 04/02/2023 13:38   ECHOCARDIOGRAM COMPLETE Result Date: 04/01/2023    ECHOCARDIOGRAM REPORT   Patient Name:   Tony Benson Date of Exam: 04/01/2023 Medical Rec #:  [id number]     Height:       70.0 in Accession #:    [id number]    Weight:       149.0 lb Date of Birth:  1961/01/16    BSA:          1.842 m Patient Age:    62 years      BP:           109/52 mmHg Patient Gender: M             HR:           100 bpm. Exam Location:  [facility] Procedure: 2D Echo,  Cardiac Doppler and Color Doppler Indications:     Pericardial Effusion I31.3  History:         Patient has prior history of Echocardiogram examinations, most  recent 03/04/2023. Risk Factors:Diabetes, Hypertension and                  Sleep Apnea.  Sonographer:     Cristela Blue Referring Phys:  [id number] JAN A MANSY Diagnosing Phys: Yvonne Kendall MD IMPRESSIONS  1. Left ventricular ejection fraction, by estimation, is 60 to 65%. The left ventricle has normal function. The left ventricle has no regional wall motion abnormalities. There is moderate left ventricular hypertrophy. Left ventricular diastolic parameters are consistent with Grade II diastolic dysfunction (pseudonormalization).  2. Right ventricular systolic function is normal. The right ventricular size is normal. There is mildly elevated pulmonary artery systolic pressure.  3. There is equivocal early diastolic collapse of the right ventricle. However, mitral and tricuspid valve inflow velocity respiratory variation is not consistent with tamponade physiology.. Large pericardial effusion. The pericardial effusion is circumferential.  4. The mitral valve is normal in structure. Trivial mitral valve regurgitation. No evidence of mitral stenosis.  5. The aortic valve is tricuspid. Aortic valve regurgitation is not visualized. No aortic stenosis is present.  6. The inferior vena cava is normal in size with <50% respiratory variability, suggesting right atrial pressure of 8 mmHg. Comparison(s): A prior study was performed on 03/04/2023. The pericardial effusion appears somewhat larger. FINDINGS  Left Ventricle: Left ventricular ejection fraction, by estimation, is 60 to 65%. The left ventricle has normal function. The left ventricle has no regional wall motion abnormalities. The left ventricular internal cavity size was normal in size. There is  moderate left ventricular hypertrophy. Left ventricular diastolic parameters are consistent with Grade  II diastolic dysfunction (pseudonormalization). Right Ventricle: The right ventricular size is normal. No increase in right ventricular wall thickness. Right ventricular systolic function is normal. There is mildly elevated pulmonary artery systolic pressure. The tricuspid regurgitant velocity is 3.00  m/s, and with an assumed right atrial pressure of 8 mmHg, the estimated right ventricular systolic pressure is 44.0 mmHg. Left Atrium: Left atrial size was normal in size. Right Atrium: Right atrial size was normal in size. Pericardium: There is equivocal early diastolic collapse of the right ventricle. However, mitral and tricuspid valve inflow velocity respiratory variation is not consistent with tamponade physiology. A large pericardial effusion is present. The pericardial effusion is circumferential. Mitral Valve: The mitral valve is normal in structure. Trivial mitral valve regurgitation. No evidence of mitral valve stenosis. MV peak gradient, 4.8 mmHg. The mean mitral valve gradient is 3.0 mmHg. Tricuspid Valve: The tricuspid valve is normal in structure. Tricuspid valve regurgitation is mild. Aortic Valve: The aortic valve is tricuspid. Aortic valve regurgitation is not visualized. No aortic stenosis is present. Aortic valve mean gradient measures 4.0 mmHg. Aortic valve peak gradient measures 6.0 mmHg. Aortic valve area, by VTI measures 2.91 cm. Pulmonic Valve: The pulmonic valve was grossly normal. Pulmonic valve regurgitation is trivial. No evidence of pulmonic stenosis. Aorta: The aortic root is normal in size and structure. Pulmonary Artery: The pulmonary artery is not well seen. Venous: The inferior vena cava is normal in size with less than 50% respiratory variability, suggesting right atrial pressure of 8 mmHg. IAS/Shunts: No atrial level shunt detected by color flow Doppler.  LEFT VENTRICLE PLAX 2D LVIDd:         4.10 cm   Diastology LVIDs:         1.80 cm   LV e' medial:    6.42 cm/s LV PW:          1.50 cm   LV  E/e' medial:  13.8 LV IVS:        1.20 cm   LV e' lateral:   15.00 cm/s LVOT diam:     2.10 cm   LV E/e' lateral: 5.9 LV SV:         58 LV SV Index:   32 LVOT Area:     3.46 cm  RIGHT VENTRICLE RV Basal diam:  2.90 cm RV Mid diam:    2.50 cm RV S prime:     20.90 cm/s TAPSE (M-mode): 1.8 cm LEFT ATRIUM           Index        RIGHT ATRIUM          Index LA diam:      2.40 cm 1.30 cm/m   RA Area:     9.46 cm LA Vol (A4C): 20.3 ml 11.02 ml/m  RA Volume:   14.30 ml 7.76 ml/m  AORTIC VALVE AV Area (Vmax):    2.70 cm AV Area (Vmean):   2.44 cm AV Area (VTI):     2.91 cm AV Vmax:           122.00 cm/s AV Vmean:          90.900 cm/s AV VTI:            0.200 m AV Peak Grad:      6.0 mmHg AV Mean Grad:      4.0 mmHg LVOT Vmax:         95.00 cm/s LVOT Vmean:        64.000 cm/s LVOT VTI:          0.168 m LVOT/AV VTI ratio: 0.84  AORTA Ao Root diam: 3.05 cm MITRAL VALVE               TRICUSPID VALVE MV Area (PHT): 4.57 cm    TR Peak grad:   36.0 mmHg MV Area VTI:   2.32 cm    TR Vmax:        300.00 cm/s MV Peak grad:  4.8 mmHg MV Mean grad:  3.0 mmHg    SHUNTS MV Vmax:       1.09 m/s    Systemic VTI:  0.17 m MV Vmean:      80.1 cm/s   Systemic Diam: 2.10 cm MV Decel Time: 166 msec MV E velocity: 88.60 cm/s MV A velocity: 78.10 cm/s MV E/A ratio:  1.13 Cristal Deer End MD Electronically signed by Yvonne Kendall MD Signature Date/Time: 04/01/2023/2:19:37 PM    Final    CT Angio Chest PE W/Cm &/Or Wo Cm Result Date: 04/01/2023 CLINICAL DATA:  Cough and fever for 3 days. On chemo for pancreatic cancer. Elevated heart rate. PE suspected. EXAM: CT ANGIOGRAPHY CHEST WITH CONTRAST TECHNIQUE: Multidetector CT imaging of the chest was performed using the standard protocol during bolus administration of intravenous contrast. Multiplanar CT image reconstructions and MIPs were obtained to evaluate the vascular anatomy. RADIATION DOSE REDUCTION: This exam was performed according to the departmental dose-optimization  program which includes automated exposure control, adjustment of the mA and/or kV according to patient size and/or use of iterative reconstruction technique. CONTRAST:  75mL OMNIPAQUE IOHEXOL 350 MG/ML SOLN COMPARISON:  Same day chest radiograph and CTA chest 04/28/2022 FINDINGS: Cardiovascular: Right IJ CVC tip at the superior cavoatrial junction. Negative for acute pulmonary embolism. Large pericardial effusion increased compared to 02/27/2023. Clinical correlation recommended to exclude tamponade physiology. Mild aortic atherosclerotic calcification. Mediastinum/Nodes: Debris layering within  the posterior trachea. Esophagus is unremarkable. Surgical clips adjacent to the lower esophagus. Mediastinal and hilar adenopathy is not well evaluated. Lungs/Pleura: Diffuse bronchial wall thickening scattered mucous plugging greatest in the right lower lobe. Bilateral centrilobular micro nodules and tree-in-bud opacities with more confluence opacities in the right lung. Irregular pleural thickening or loculated pleural effusion in the lower right chest has increased compared to 02/27/2023. There is associated pleural nodularity in the posterior lower lobe. For example 14 x 9 mm nodule on series 5/image 97. Pleural calcifications in the posterior right lung base. No pneumothorax. Upper Abdomen: Incompletely evaluated large metastasis in the right hepatic lobe appears slightly increased compared to 02/27/2023, measuring up to 14.5 cm previously 13.3 cm. Partially visualized dilated main pancreatic duct. Pancreatic head is not included in the exam. Small amount of perisplenic ascites. Musculoskeletal: No acute fracture or destructive osseous lesion. Review of the MIP images confirms the above findings. IMPRESSION: 1. Negative for acute pulmonary embolism. 2. Large pericardial effusion increased compared to 02/27/2023. Clinical correlation recommended to exclude tamponade physiology. 3. Increased bilateral centrilobular micro  nodules and tree-in-bud opacities with more confluence opacities in the right lung, likely due to bronchopneumonia from aspiration. Lymphangitic spread of tumor could appear similarly. 4. Irregular pleural thickening or loculated pleural effusion in the lower right chest has increased compared to 02/27/2023. There is associated pleural nodularity in the posterior lower lobe. This may be due to bronchopneumonia though metastases are not excluded. 5. 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 Aortic Atherosclerosis (ICD10-I70.0). Electronically Signed   By: Minerva Fester M.D.   On: 04/01/2023 00:53   DG Chest 2 View Result Date: 03/31/2023 CLINICAL DATA:  Cough and fever EXAM: CHEST - 2 VIEW COMPARISON:  Chest x-ray 03/05/2023.  Chest CT 02/27/2023. FINDINGS: There is a new right chest port with distal catheter tip projecting over the distal SVC. The cardiac silhouette appears mildly enlarged, unchanged. Small right pleural effusion persists. Patchy opacities in the right lung base have decreased, but have not completely resolved. There is a new focal nodular density in the left mid lung measuring 8 mm. The left lung is otherwise clear. There is no pneumothorax or acute fracture. IMPRESSION: 1. Small right pleural effusion persists. 2. Patchy opacities in the right lung base have decreased, but have not completely resolved. 3. New 8 mm nodular density in the left mid lung. Recommend follow-up chest CT. Electronically Signed   By: Darliss Cheney M.D.   On: 03/31/2023 21:10   NM Bone Scan Whole Body Result Date: 03/30/2023 CLINICAL DATA:  Metastatic pancreatic cancer. Evaluate for bone disease. EXAM: NUCLEAR MEDICINE WHOLE BODY BONE SCAN TECHNIQUE: Whole body anterior and posterior images were obtained approximately 3 hours after intravenous injection of radiopharmaceutical. RADIOPHARMACEUTICALS:  21.78 mCi Technetium-80m MDP IV COMPARISON:  CTA chest 02/27/2023 and abdominal MRI 02/28/2023 FINDINGS: No  abnormal uptake involving the axial or appendicular skeleton. Expected uptake in the renal collecting systems and urinary bladder. IMPRESSION: Negative bone scan.  No evidence for metastatic bone disease. Electronically Signed   By: Richarda Overlie M.D.   On: 03/30/2023 21:01   US Venous Img Lower Bilateral Result Date: 03/25/2023 CLINICAL DATA:  Follow-up lower extremity DVT. History of prostate cancer. Patient is on anticoagulation. EXAM: BILATERAL LOWER EXTREMITY VENOUS DOPPLER ULTRASOUND TECHNIQUE: Gray-scale sonography with graded compression, as well as color Doppler and duplex ultrasound were performed to evaluate the lower extremity deep venous systems from the level of the common femoral vein and including the  common femoral, femoral, profunda femoral, popliteal and calf veins including the posterior tibial, peroneal and gastrocnemius veins when visible. The superficial great saphenous vein was also interrogated. Spectral Doppler was utilized to evaluate flow at rest and with distal augmentation maneuvers in the common femoral, femoral and popliteal veins. COMPARISON:  Bilateral lower extremity venous Doppler ultrasound-02/26/2023 (positive for DVT extending from right femoral vein through right tibial veins as well as the left popliteal vein through the left tibial veins) FINDINGS: RIGHT LOWER EXTREMITY Common Femoral Vein: No evidence of thrombus. Normal compressibility, respiratory phasicity and response to augmentation. Saphenofemoral Junction: No evidence of thrombus. Normal compressibility and flow on color Doppler imaging. Profunda Femoral Vein: No evidence of thrombus. Normal compressibility and flow on color Doppler imaging. Femoral Vein: There is predominantly occlusive DVT involving the proximal (image 41), mid (image 44) and distal (image 47) aspects of the right femoral vein, grossly unchanged compared to the 02/26/2023 examination. Popliteal Vein: Redemonstrated near occlusive DVT involving the  right popliteal vein (images 47 and 50), similar to the 02/26/2023 examination. Calf Veins: Redemonstrated occlusive DVT involving both paired divisions of the left posterior tibial (image 56) as well as both paired divisions of the right peroneal veins (image 58). Superficial Great Saphenous Vein: No evidence of thrombus. Normal compressibility. Other Findings:  None. _________________________________________________________ LEFT LOWER EXTREMITY Common Femoral Vein: No evidence of thrombus. Normal compressibility, respiratory phasicity and response to augmentation. Saphenofemoral Junction: No evidence of thrombus. Normal compressibility and flow on color Doppler imaging. Profunda Femoral Vein: No evidence of thrombus. Normal compressibility and flow on color Doppler imaging. Femoral Vein: No evidence of thrombus. Normal compressibility, respiratory phasicity and response to augmentation. Popliteal Vein: Redemonstrated occlusive DVT involving the left popliteal vein (images 21 and 25), unchanged compared to the 02/26/2023 examination. Calf Veins: Redemonstrated occlusive DVT involving both paired divisions of the left posterior tibial vein (images 27 and 28), unchanged compared to the 02/26/2023 examination. The left peroneal veins appear patent where imaged (image 30), improved compared to the 02/26/2023 examination. Superficial Great Saphenous Vein: No evidence of thrombus. Normal compressibility. Other Findings:  None. IMPRESSION: 1. Redemonstrated bilateral lower extremity DVT extending from the right femoral vein through the right calf veins, grossly unchanged compared to the 02/26/2023 examination. 2. Resolved left peroneal DVT. 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 Electronically Signed   By: Simonne Come M.D.   On: 03/25/2023 12:10   IR IMAGING GUIDED PORT INSERTION Result  Date: 03/24/2023 CLINICAL DATA:  Metastatic pancreas CANCER TO THE LIVER EXAM: RIGHT INTERNAL JUGULAR SINGLE LUMEN POWER PORT CATHETER INSERTION Date:  03/24/2023 03/24/2023 11:55 am Radiologist:  M. Ruel Favors, MD Guidance:  Ultrasound and fluoroscopic MEDICATIONS: 1% lidocaine local with epinephrine ANESTHESIA/SEDATION: Versed 0.5 mg IV; Fentanyl 25 mcg IV; Moderate Sedation Time:  30 minutes The patient was continuously monitored during the procedure by the interventional radiology nurse under my direct supervision. FLUOROSCOPY: 0 minutes, 71 seconds (3.4 mGy) COMPLICATIONS: None immediate. CONTRAST:  None. PROCEDURE: Informed consent was obtained from the patient following explanation of the procedure, risks, benefits and alternatives. The patient understands, agrees and consents for the procedure. All questions were addressed. A time out was performed. Maximal barrier sterile technique utilized including caps, mask, sterile gowns, sterile gloves, large sterile drape, hand hygiene, and 2% chlorhexidine scrub. Under sterile conditions and local anesthesia, right internal jugular micropuncture venous access was performed. Access was  performed with ultrasound. Images were obtained for documentation of the patent right internal jugular vein. A guide wire was inserted followed by a transitional dilator. This allowed insertion of a guide wire and catheter into the IVC. Measurements were obtained from the SVC / RA junction back to the right IJ venotomy site. In the right infraclavicular chest, a subcutaneous pocket was created over the second anterior rib. This was done under sterile conditions and local anesthesia. 1% lidocaine with epinephrine was utilized for this. A 2.5 cm incision was made in the skin. Blunt dissection was performed to create a subcutaneous pocket over the right pectoralis major muscle. The pocket was flushed with saline vigorously. There was adequate hemostasis. The port catheter was assembled  and checked for leakage. The port catheter was secured in the pocket with two retention sutures. The tubing was tunneled subcutaneously to the right venotomy site and inserted into the SVC/RA junction through a valved peel-away sheath. Position was confirmed with fluoroscopy. Images were obtained for documentation. The patient tolerated the procedure well. No immediate complications. Incisions were closed in a two layer fashion with 4 - 0 Vicryl suture. Dermabond was applied to the skin. The port catheter was accessed, blood was aspirated followed by saline and heparin flushes. Needle was removed. A dry sterile dressing was applied. IMPRESSION: Ultrasound and fluoroscopically guided right internal jugular single lumen power port catheter insertion. Tip in the SVC/RA junction. Catheter ready for use. Electronically Signed   By: Judie Petit.  Shick M.D.   On: 03/24/2023 12:17     The results of significant diagnostics from this hospitalization (including imaging, microbiology, ancillary and laboratory) are listed below for reference.     Microbiology: Recent Results (from the past 240 hours)  Resp panel by RT-PCR (RSV, Flu A&B, Covid) Anterior Nasal Swab     Status: None   Collection Time: 03/31/23  7:13 PM   Specimen: Anterior Nasal Swab  Result Value Ref Range Status   SARS Coronavirus 2 by RT PCR NEGATIVE NEGATIVE Final    Comment: (NOTE) SARS-CoV-2 target nucleic acids are NOT DETECTED.  The SARS-CoV-2 RNA is generally detectable in upper respiratory specimens during the acute phase of infection. The lowest concentration of SARS-CoV-2 viral copies this assay can detect is 138 copies/mL. A negative result does not preclude SARS-Cov-2 infection and should not be used as the sole basis for treatment or other patient management decisions. A negative result may occur with  improper specimen collection/handling, submission of specimen other than nasopharyngeal swab, presence of viral mutation(s) within  the areas targeted by this assay, and inadequate number of viral copies(<138 copies/mL). A negative result must be combined with clinical observations, patient history, and epidemiological information. The expected result is Negative.  Fact Sheet for Patients:  BloggerCourse.com  Fact Sheet for Healthcare Providers:  SeriousBroker.it  This test is no t yet approved or cleared by the [place] FDA and  has been authorized for detection and/or diagnosis of SARS-CoV-2 by FDA under an Emergency Use Authorization (EUA). This EUA will remain  in effect (meaning this test can be used) for the duration of the COVID-19 declaration under Section 564(b)(1) of the Act, 21 U.S.C.section 360bbb-3(b)(1), unless the authorization is terminated  or revoked sooner.       Influenza A by PCR NEGATIVE NEGATIVE Final   Influenza B by PCR NEGATIVE NEGATIVE Final    Comment: (NOTE) The Xpert Xpress SARS-CoV-2/FLU/RSV plus assay is intended as an aid in the diagnosis of influenza from Nasopharyngeal  swab specimens and should not be used as a sole basis for treatment. Nasal washings and aspirates are unacceptable for Xpert Xpress SARS-CoV-2/FLU/RSV testing.  Fact Sheet for Patients: BloggerCourse.com  Fact Sheet for Healthcare Providers: SeriousBroker.it  This test is not yet approved or cleared by the [place] FDA and has been authorized for detection and/or diagnosis of SARS-CoV-2 by FDA under an Emergency Use Authorization (EUA). This EUA will remain in effect (meaning this test can be used) for the duration of the COVID-19 declaration under Section 564(b)(1) of the Act, 21 U.S.C. section 360bbb-3(b)(1), unless the authorization is terminated or revoked.     Resp Syncytial Virus by PCR NEGATIVE NEGATIVE Final    Comment: (NOTE) Fact Sheet for  Patients: BloggerCourse.com  Fact Sheet for Healthcare Providers: SeriousBroker.it  This test is not yet approved or cleared by the [place] FDA and has been authorized for detection and/or diagnosis of SARS-CoV-2 by FDA under an Emergency Use Authorization (EUA). This EUA will remain in effect (meaning this test can be used) for the duration of the COVID-19 declaration under Section 564(b)(1) of the Act, 21 U.S.C. section 360bbb-3(b)(1), unless the authorization is terminated or revoked.  Performed at [facility], [address] [place], [place] [address]      Labs: BNP (last 3 results) Recent Labs    02/24/23 1447 02/28/23 1917  BNP 35.9 54.4   Basic Metabolic Panel: Recent Labs  Lab 04/01/23 0547 04/02/23 0547 04/03/23 0534 04/04/23 0531 04/05/23 0442  NA 142 142 141 142 138  K 4.4 4.3 4.3 3.7 3.6  CL 111 109 111 111 109  CO2 23 25 19* 23 23  GLUCOSE 167* 193* 180* 152* 158*  BUN 29* 20 15 14 15   CREATININE 0.74 0.57* 0.66 0.47* 0.46*  CALCIUM 8.8* 8.8* 9.1 8.6* 8.6*  MG  --   --   --   --  2.1   Liver Function Tests: Recent Labs  Lab 03/31/23 2200  AST 23  ALT 28  ALKPHOS 670*  BILITOT 1.2*  PROT 6.5  ALBUMIN 2.1*   No results for input(s): "LIPASE", "AMYLASE" in the last 168 hours. No results for input(s): "AMMONIA" in the last 168 hours. CBC: Recent Labs  Lab 03/31/23 2200 04/01/23 0547 04/02/23 0547 04/03/23 0534 04/04/23 0531 04/05/23 0442  WBC 8.7 9.9 14.8* 14.6* 9.8 9.1  NEUTROABS 8.2*  --  13.5* 12.9* 8.2*  --   HGB 7.5* 6.5* 8.7* 10.2* 8.5* 8.9*  HCT 25.3* 21.8* 28.0* 32.5* 27.6* 29.3*  MCV 87.8 88.6 83.6 81.3 83.9 84.4  PLT 201 161 170 223 210 252   Cardiac Enzymes: No results for input(s): "CKTOTAL", "CKMB", "CKMBINDEX", "TROPONINI" in the last 168 hours. BNP: Invalid input(s): "POCBNP" CBG: Recent Labs  Lab 04/04/23 0840 04/04/23 1158 04/04/23 1652  04/04/23 2118 04/05/23 0813  GLUCAP 267* 151* 65* 134* 196*   D-Dimer No results for input(s): "DDIMER" in the last 72 hours. Hgb A1c No results for input(s): "HGBA1C" in the last 72 hours. Lipid Profile No results for input(s): "CHOL", "HDL", "LDLCALC", "TRIG", "CHOLHDL", "LDLDIRECT" in the last 72 hours. Thyroid function studies No results for input(s): "TSH", "T4TOTAL", "T3FREE", "THYROIDAB" in the last 72 hours.  Invalid input(s): "FREET3" Anemia work up No results for input(s): "VITAMINB12", "FOLATE", "FERRITIN", "TIBC", "IRON", "RETICCTPCT" in the last 72 hours. Urinalysis    Component Value Date/Time   COLORURINE YELLOW (A) 04/01/2023 1853   APPEARANCEUR CLEAR (A) 04/01/2023 1853   LABSPEC 1.030 04/01/2023  1853   PHURINE 5.0 04/01/2023 1853   GLUCOSEU >=500 (A) 04/01/2023 1853   HGBUR SMALL (A) 04/01/2023 1853   BILIRUBINUR NEGATIVE 04/01/2023 1853   KETONESUR 5 (A) 04/01/2023 1853   PROTEINUR NEGATIVE 04/01/2023 1853   NITRITE NEGATIVE 04/01/2023 1853   LEUKOCYTESUR NEGATIVE 04/01/2023 1853   Sepsis Labs Recent Labs  Lab 04/02/23 0547 04/03/23 0534 04/04/23 0531 04/05/23 0442  WBC 14.8* 14.6* 9.8 9.1   Microbiology Recent Results (from the past 240 hours)  Resp panel by RT-PCR (RSV, Flu A&B, Covid) Anterior Nasal Swab     Status: None   Collection Time: 03/31/23  7:13 PM   Specimen: Anterior Nasal Swab  Result Value Ref Range Status   SARS Coronavirus 2 by RT PCR NEGATIVE NEGATIVE Final    Comment: (NOTE) SARS-CoV-2 target nucleic acids are NOT DETECTED.  The SARS-CoV-2 RNA is generally detectable in upper respiratory specimens during the acute phase of infection. The lowest concentration of SARS-CoV-2 viral copies this assay can detect is 138 copies/mL. A negative result does not preclude SARS-Cov-2 infection and should not be used as the sole basis for treatment or other patient management decisions. A negative result may occur with  improper  specimen collection/handling, submission of specimen other than nasopharyngeal swab, presence of viral mutation(s) within the areas targeted by this assay, and inadequate number of viral copies(<138 copies/mL). A negative result must be combined with clinical observations, patient history, and epidemiological information. The expected result is Negative.  Fact Sheet for Patients:  BloggerCourse.com  Fact Sheet for Healthcare Providers:  SeriousBroker.it  This test is no t yet approved or cleared by the [place] FDA and  has been authorized for detection and/or diagnosis of SARS-CoV-2 by FDA under an Emergency Use Authorization (EUA). This EUA will remain  in effect (meaning this test can be used) for the duration of the COVID-19 declaration under Section 564(b)(1) of the Act, 21 U.S.C.section 360bbb-3(b)(1), unless the authorization is terminated  or revoked sooner.       Influenza A by PCR NEGATIVE NEGATIVE Final   Influenza B by PCR NEGATIVE NEGATIVE Final    Comment: (NOTE) The Xpert Xpress SARS-CoV-2/FLU/RSV plus assay is intended as an aid in the diagnosis of influenza from Nasopharyngeal swab specimens and should not be used as a sole basis for treatment. Nasal washings and aspirates are unacceptable for Xpert Xpress SARS-CoV-2/FLU/RSV testing.  Fact Sheet for Patients: BloggerCourse.com  Fact Sheet for Healthcare Providers: SeriousBroker.it  This test is not yet approved or cleared by the [place] FDA and has been authorized for detection and/or diagnosis of SARS-CoV-2 by FDA under an Emergency Use Authorization (EUA). This EUA will remain in effect (meaning this test can be used) for the duration of the COVID-19 declaration under Section 564(b)(1) of the Act, 21 U.S.C. section 360bbb-3(b)(1), unless the authorization is terminated or revoked.     Resp  Syncytial Virus by PCR NEGATIVE NEGATIVE Final    Comment: (NOTE) Fact Sheet for Patients: BloggerCourse.com  Fact Sheet for Healthcare Providers: SeriousBroker.it  This test is not yet approved or cleared by the [place] FDA and has been authorized for detection and/or diagnosis of SARS-CoV-2 by FDA under an Emergency Use Authorization (EUA). This EUA will remain in effect (meaning this test can be used) for the duration of the COVID-19 declaration under Section 564(b)(1) of the Act, 21 U.S.C. section 360bbb-3(b)(1), unless the authorization is terminated or revoked.  Performed at [facility], [address]  [place], [place] [address]      Time coordinating discharge:  I have spent 35 minutes face to face with the patient and on the ward discussing the patients care, assessment, plan and disposition with other care givers. >50% of the time was devoted counseling the patient about the risks and benefits of treatment/Discharge disposition and coordinating care.   SIGNED:   Miguel Rota, MD  [facility] 04/05/2023, 11:20 AM   If 7PM-7AM, please contact night-coverage

## 2023-04-05 NOTE — Progress Notes (Signed)
 Patient is not able to walk the distance required to go the bathroom, or he/she is unable to safely negotiate stairs required to access the bathroom.  A BSC will alleviate this problem.  Rodney Langton, RN, MSN, CCM TOC RNCM

## 2023-04-05 NOTE — TOC Transition Note (Signed)
Transition of Care [facility]) - Discharge Note   Patient Details  Name: Tony Benson MRN: [id number] Date of Birth: Feb 03, 1961  Transition of Care [facility]) CM/SW Contact:  Rodney Langton, RN Phone Number: 04/05/2023, 12:15 PM   Clinical Narrative:     Patient has discharge orders to go home with home health.  MD aware that [facility] has not been able to secure agency for [facility] services.  Double checked again today with [facility] and [facility], both unable to take.  Spoke with daughter, updated her on barriers.  She will call insurance company directly to inquire about options for home health services that are needed as well as outpatient therapy options if they are not able to secure home health.    Patient also in need of rolling walker with seat, bedside commode, and oral suction.  Kim with [employer] notified, DME will be delivered to the bedside prior to discharge.    Final next level of care: Home/Self Care Barriers to Discharge: Barriers Resolved   Patient Goals and CMS Choice Patient states their goals for this hospitalization and ongoing recovery are:: Home to recover   Choice offered to / list presented to : Patient      Discharge Placement                       Discharge Plan and Services Additional resources added to the After Visit Summary for       Post Acute Care Choice: Home Health          DME Arranged: Walker rolling with seat, Bedside commode, Suction DME Agency: [employer] Date DME Agency Contacted: 04/05/23 Time DME Agency Contacted: 1214 Representative spoke with at DME Agency: Selena Batten            Social Drivers of Health (SDOH) Interventions SDOH Screenings   Food Insecurity: No Food Insecurity (04/02/2023)  Housing: Unknown (04/02/2023)  Transportation Needs: No Transportation Needs (04/02/2023)  Utilities: Not At Risk (04/02/2023)  Depression (PHQ2-9): Low Risk  (02/24/2023)  Tobacco Use: Low Risk  (04/02/2023)     Readmission Risk Interventions     04/04/2023    9:56 AM  Readmission Risk Prevention Plan  Transportation Screening Complete  HRI or Home Care Consult Complete  Social Work Consult for Recovery Care Planning/Counseling Complete  Palliative Care Screening Complete  Medication Review Oceanographer) Complete

## 2023-04-05 NOTE — Progress Notes (Signed)
   04/04/23 2046  Assess: MEWS Score  Temp 98.4 F (36.9 C)  BP 117/65  MAP (mmHg) 77  Pulse Rate (!) 103  Resp 12  SpO2 98 %  O2 Device Room Air  Assess: MEWS Score  MEWS Temp 0  MEWS Systolic 0  MEWS Pulse 1  MEWS RR 1  MEWS LOC 0  MEWS Score 2  MEWS Score Color Yellow  Assess: if the MEWS score is Yellow or Red  Were vital signs accurate and taken at a resting state? Yes  Does the patient meet 2 or more of the SIRS criteria? No  Does the patient have a confirmed or suspected source of infection? No  MEWS guidelines implemented  No, previously yellow, continue vital signs every 4 hours  Notify: Charge Nurse/RN  Name of Charge Nurse/RN Notified Donna, RN  Provider Notification  Provider Name/Title Manuela Schwartz, NP  Date Provider Notified 04/04/23  Time Provider Notified 2111  Method of Notification Page (secure chat)  Notification Reason Other (Comment) (Yellow MEWS)  Assess: SIRS CRITERIA  SIRS Temperature  0  SIRS Respirations  0  SIRS Pulse 1  SIRS WBC 0  SIRS Score Sum  1

## 2023-04-05 NOTE — Plan of Care (Signed)
  Problem: Education: Goal: Knowledge of General Education information will improve Description: Including pain rating scale, medication(s)/side effects and non-pharmacologic comfort measures Outcome: Progressing   Problem: Clinical Measurements: Goal: Ability to maintain clinical measurements within normal limits will improve Outcome: Progressing Goal: Diagnostic test results will improve Outcome: Progressing Goal: Respiratory complications will improve Outcome: Progressing Goal: Cardiovascular complication will be avoided Outcome: Progressing   Problem: Activity: Goal: Risk for activity intolerance will decrease Outcome: Progressing   Problem: Coping: Goal: Level of anxiety will decrease Outcome: Progressing   Problem: Elimination: Goal: Will not experience complications related to bowel motility Outcome: Progressing Goal: Will not experience complications related to urinary retention Outcome: Progressing   Problem: Pain Management: Goal: General experience of comfort will improve Outcome: Progressing   Problem: Safety: Goal: Ability to remain free from injury will improve Outcome: Progressing   Problem: Skin Integrity: Goal: Risk for impaired skin integrity will decrease Outcome: Progressing

## 2023-04-07 ENCOUNTER — Telehealth: Payer: Self-pay

## 2023-04-07 DIAGNOSIS — I3139 Other pericardial effusion (noninflammatory): Secondary | ICD-10-CM

## 2023-04-07 SURGERY — PERICARDIOCENTESIS
Anesthesia: Moderate Sedation

## 2023-04-07 NOTE — Telephone Encounter (Signed)
Limited ECHO ordered as requested and message sent to scheduling.    End, Cristal Deer, MD  [facility] Scheduling; [facility] Triage Hello,  Could you arrange for Tony Benson to follow-up with me or an APP in about 2 weeks.  If a limited echo to reevaluate his pericardial effusion could be done at that time or shortly before the visit, that would be much appreciated.  Yvonne Kendall, MD [facility]

## 2023-04-08 ENCOUNTER — Other Ambulatory Visit: Payer: BC Managed Care – PPO

## 2023-04-08 ENCOUNTER — Ambulatory Visit: Payer: BC Managed Care – PPO | Admitting: Internal Medicine

## 2023-04-08 ENCOUNTER — Ambulatory Visit: Payer: BC Managed Care – PPO

## 2023-04-10 ENCOUNTER — Inpatient Hospital Stay: Payer: BC Managed Care – PPO

## 2023-04-10 NOTE — Progress Notes (Signed)
[facility] CSW Progress Note  Clinical Child psychotherapist returned call to patient's wife, Tony Benson.  She inquired about dressing changes for patient's wounds. After reviewing patient's chart, encouraged her to contact patient's insurance company to verify if they will provide wound care at home.  If not, she said she would speak with Dr. Alena Bills tomorrow at his appointment.   Dorothey Baseman, LCSW Clinical Social Worker  Cancer Cent

## 2023-04-11 ENCOUNTER — Encounter: Payer: Self-pay | Admitting: Internal Medicine

## 2023-04-11 ENCOUNTER — Inpatient Hospital Stay: Payer: BC Managed Care – PPO

## 2023-04-11 ENCOUNTER — Inpatient Hospital Stay (HOSPITAL_BASED_OUTPATIENT_CLINIC_OR_DEPARTMENT_OTHER): Payer: BC Managed Care – PPO | Admitting: Internal Medicine

## 2023-04-11 VITALS — BP 133/80 | HR 91 | Temp 97.5°F | Resp 17

## 2023-04-11 VITALS — BP 113/72 | HR 97 | Temp 99.1°F | Resp 18 | Wt 155.6 lb

## 2023-04-11 DIAGNOSIS — Z5111 Encounter for antineoplastic chemotherapy: Secondary | ICD-10-CM | POA: Insufficient documentation

## 2023-04-11 DIAGNOSIS — D709 Neutropenia, unspecified: Secondary | ICD-10-CM

## 2023-04-11 DIAGNOSIS — D649 Anemia, unspecified: Secondary | ICD-10-CM | POA: Diagnosis not present

## 2023-04-11 DIAGNOSIS — R531 Weakness: Secondary | ICD-10-CM | POA: Diagnosis not present

## 2023-04-11 DIAGNOSIS — C259 Malignant neoplasm of pancreas, unspecified: Secondary | ICD-10-CM | POA: Diagnosis not present

## 2023-04-11 DIAGNOSIS — C787 Secondary malignant neoplasm of liver and intrahepatic bile duct: Secondary | ICD-10-CM

## 2023-04-11 LAB — CMP (CANCER CENTER ONLY)
ALT: 19 U/L (ref 0–44)
AST: 32 U/L (ref 15–41)
Albumin: 1.8 g/dL — ABNORMAL LOW (ref 3.5–5.0)
Alkaline Phosphatase: 825 U/L — ABNORMAL HIGH (ref 38–126)
Anion gap: 8 (ref 5–15)
BUN: 17 mg/dL (ref 8–23)
CO2: 22 mmol/L (ref 22–32)
Calcium: 10.1 mg/dL (ref 8.9–10.3)
Chloride: 104 mmol/L (ref 98–111)
Creatinine: 0.55 mg/dL — ABNORMAL LOW (ref 0.61–1.24)
GFR, Estimated: >60 mL/min (ref >60)
Glucose, Bld: 217 mg/dL — ABNORMAL HIGH (ref 70–99)
Potassium: 4.1 mmol/L (ref 3.5–5.1)
Sodium: 134 mmol/L — ABNORMAL LOW (ref 135–145)
Total Bilirubin: 0.7 mg/dL (ref <1.2)
Total Protein: 6.3 g/dL — ABNORMAL LOW (ref 6.5–8.1)

## 2023-04-11 LAB — CBC WITH DIFFERENTIAL (CANCER CENTER ONLY)
Abs Immature Granulocytes: 0.27 10*3/uL — ABNORMAL HIGH (ref 0.00–0.07)
Basophils Absolute: 0.1 10*3/uL (ref 0.0–0.1)
Basophils Relative: 1 %
Eosinophils Absolute: 0.3 10*3/uL (ref 0.0–0.5)
Eosinophils Relative: 1 %
HCT: 32.3 % — ABNORMAL LOW (ref 39.0–52.0)
Hemoglobin: 9.5 g/dL — ABNORMAL LOW (ref 13.0–17.0)
Immature Granulocytes: 1 %
Lymphocytes Relative: 1 %
Lymphs Abs: 0.3 10*3/uL — ABNORMAL LOW (ref 0.7–4.0)
MCH: 25.1 pg — ABNORMAL LOW (ref 26.0–34.0)
MCHC: 29.4 g/dL — ABNORMAL LOW (ref 30.0–36.0)
MCV: 85.4 fL (ref 80.0–100.0)
Monocytes Absolute: 1.6 10*3/uL — ABNORMAL HIGH (ref 0.1–1.0)
Monocytes Relative: 7 %
Neutro Abs: 20.1 10*3/uL — ABNORMAL HIGH (ref 1.7–7.7)
Neutrophils Relative %: 89 %
Platelet Count: 471 10*3/uL — ABNORMAL HIGH (ref 150–400)
RBC: 3.78 MIL/uL — ABNORMAL LOW (ref 4.22–5.81)
RDW: 17.8 % — ABNORMAL HIGH (ref 11.5–15.5)
WBC Count: 22.6 10*3/uL — ABNORMAL HIGH (ref 4.0–10.5)
nRBC: 0 % (ref 0.0–0.2)

## 2023-04-11 MED ORDER — HEPARIN SOD (PORK) LOCK FLUSH 100 UNIT/ML IV SOLN
500.0000 [IU] | Freq: Once | INTRAVENOUS | Status: AC | PRN
  Administered 2023-04-11: 500 [IU]
  Filled 2023-04-11: qty 5

## 2023-04-11 MED ORDER — SODIUM CHLORIDE 0.9 % IV SOLN
INTRAVENOUS | Status: DC
Start: 2023-04-11 — End: 2023-04-11
  Filled 2023-04-11: qty 250

## 2023-04-11 MED ORDER — PROCHLORPERAZINE MALEATE 10 MG PO TABS
10.0000 mg | ORAL_TABLET | Freq: Once | ORAL | Status: AC
Start: 2023-04-11 — End: 2023-04-11
  Administered 2023-04-11: 10 mg via ORAL
  Filled 2023-04-11: qty 1

## 2023-04-11 MED ORDER — PACLITAXEL PROTEIN-BOUND CHEMO INJECTION 100 MG
100.0000 mg/m2 | Freq: Once | INTRAVENOUS | Status: AC
  Administered 2023-04-11: 200 mg via INTRAVENOUS
  Filled 2023-04-11: qty 40

## 2023-04-11 MED ORDER — SODIUM CHLORIDE 0.9 % IV SOLN
1000.0000 mg/m2 | Freq: Once | INTRAVENOUS | Status: AC
  Administered 2023-04-11: 1824 mg via INTRAVENOUS
  Filled 2023-04-11: qty 47.97

## 2023-04-11 NOTE — Patient Instructions (Signed)
 [facility] - A DEPT OF [facility]  Discharge Instructions: Thank you for choosing [facility] to provide your oncology and hematology care.  If you have a lab appointment with the Cancer Center, please go directly to the Cancer Center and check in at the registration area.  Wear comfortable clothing and clothing appropriate for easy access to any Portacath or PICC line.   We strive to give you quality time with your provider. You may need to reschedule your appointment if you arrive late (15 or more minutes).  Arriving late affects you and other patients whose appointments are after yours.  Also, if you miss three or more appointments without notifying the office, you may be dismissed from the clinic at the provider's discretion.      For prescription refill requests, have your pharmacy contact our office and allow 72 hours for refills to be completed.    Today you received the following chemotherapy and/or immunotherapy agents abraxane and gemzar   To help prevent nausea and vomiting after your treatment, we encourage you to take your nausea medication as directed.  BELOW ARE SYMPTOMS THAT SHOULD BE REPORTED IMMEDIATELY: *FEVER GREATER THAN 100.4 F (38 C) OR HIGHER *CHILLS OR SWEATING *NAUSEA AND VOMITING THAT IS NOT CONTROLLED WITH YOUR NAUSEA MEDICATION *UNUSUAL SHORTNESS OF BREATH *UNUSUAL BRUISING OR BLEEDING *URINARY PROBLEMS (pain or burning when urinating, or frequent urination) *BOWEL PROBLEMS (unusual diarrhea, constipation, pain near the anus) TENDERNESS IN MOUTH AND THROAT WITH OR WITHOUT PRESENCE OF ULCERS (sore throat, sores in mouth, or a toothache) UNUSUAL RASH, SWELLING OR PAIN  UNUSUAL VAGINAL DISCHARGE OR ITCHING   Items with * indicate a potential emergency and should be followed up as soon as possible or go to the Emergency Department if any problems should occur.  Please show the CHEMOTHERAPY ALERT CARD or  IMMUNOTHERAPY ALERT CARD at check-in to the Emergency Department and triage nurse.  Should you have questions after your visit or need to cancel or reschedule your appointment, please contact [facility] - A DEPT OF [facility]  [phone] and follow the prompts.  Office hours are 8:00 a.m. to 4:30 p.m. Monday - Friday. Please note that voicemails left after 4:00 p.m. may not be returned until the following business day.  We are closed weekends and major holidays. You have access to a nurse at all times for urgent questions. Please call the main number to the clinic [phone] and follow the prompts.  For any non-urgent questions, you may also contact your provider using MyChart. We now offer e-Visits for anyone 5 and older to request care online for non-urgent symptoms. For details visit [email or web address].   Also download the MyChart app! Go to the app store, search "MyChart", open the app, select [place], and log in with your MyChart username and password.

## 2023-04-11 NOTE — Progress Notes (Signed)
[facility] CONSULT NOTE  Patient Care Team: Sherrie Mustache, MD as PCP - General (Internal Medicine) End, Cristal Deer, MD as PCP - Cardiology (Cardiology) Luretha Murphy, MD as Consulting Physician (General Surgery) Earna Coder, MD as Consulting Physician (Oncology) Benita Gutter, RN as Oncology Nurse Navigator  CHIEF COMPLAINTS/PURPOSE OF CONSULTATION:  pancreatic cancer  Oncology History Overview Note   # NOV 2024- Liver, needle/core biopsy,  :      - METASTATIC CARCINOMA      - SEE NOTE       Diagnosis Note : The patient's clinical history of prostate cancer, recent      pleural effusion and MRI findings significant for a large head of pancreas mass      and multiple liver masses are noted.  Immunohistochemistry is performed on block      1B.  The tumor cells are diffusely positive for CK7 while negative for CK20,      CDX-2, TTF-1, and NKX3.1 (prostate marker).  The immunohistochemical staining      pattern is not specific and may be seen in cancers of the upper GI tract,      pancreaticobiliary system, lung (less likely due to TTF-1 negativity) and      breast.  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  Clinical and radiological correlation recommended.  Dr. [place]      reviewed the case and agrees with the above diagnosis.   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)  03/05/2023 Initial Diagnosis   Pancreatic cancer metastasized to liver (HCC)   03/12/2023 -  Chemotherapy   Patient is on Treatment Plan : PANCREATIC Abraxane D1,8,15 + Gemcitabine D1,8,15 q28d     03/12/2023 Cancer Staging   Staging form: Exocrine Pancreas, AJCC 8th Edition - Clinical: Stage IV (cT4, cN2, pM1) - Signed by Earna Coder, MD on 03/12/2023    Oncology History Overview Note   #  NOV 2024- Liver, needle/core biopsy,  :      - METASTATIC CARCINOMA      - SEE NOTE       Diagnosis Note : The patient's clinical history of prostate cancer, recent      pleural effusion and MRI findings significant for a large head of pancreas mass      and multiple liver masses are noted.  Immunohistochemistry is performed on block      1B.  The tumor cells are diffusely positive for CK7 while negative for CK20,      CDX-2, TTF-1, and NKX3.1 (prostate marker).  The immunohistochemical staining      pattern is not specific and may be seen in cancers of the upper GI tract,      pancreaticobiliary system, lung (less likely due to TTF-1 negativity) and      breast.  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  Clinical and radiological correlation recommended.  Dr. [place]      reviewed the case and agrees with the above diagnosis.   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)  03/05/2023 Initial Diagnosis   Pancreatic cancer metastasized to liver (HCC)   03/12/2023 -  Chemotherapy   Patient is on Treatment Plan : PANCREATIC Abraxane D1,8,15 + Gemcitabine D1,8,15 q28d     03/12/2023 Cancer Staging   Staging form: Exocrine Pancreas, AJCC 8th Edition - Clinical: Stage IV (cT4, cN2, pM1) - Signed by Earna Coder, MD on 03/12/2023      HISTORY OF PRESENTING ILLNESS: Patient ambulating-independently. Accompanied by his wife and daughter.   Tony Benson 62 y.o.  male pleasant patient with newly diagnosed pancreatic cancer metastatic to liver; and history of poorly controlled diabetes; acute PE/DVT on anticoagulation I currently on Gem-abraxane chemotherapy is here for a follow up.  In the interim, patient was admitted in the hospital for pneumonia and has completed antibiotics.  Venous Dopplers showed similar  appearing bilateral lower extremity DVT.  No new clots on CT angio.  He overall is feeling weak.  Not different than his baseline.  Denies any fevers, chills, shortness of breath, chest pain, urinary symptoms, diarrhea.  He has pressure sacral wound with no discharge, unchanged.   Review of Systems  Constitutional:  Positive for malaise/fatigue and weight loss. Negative for chills, diaphoresis and fever.  HENT:  Negative for nosebleeds and sore throat.   Eyes:  Negative for double vision.  Respiratory:  Positive for cough and shortness of breath. Negative for hemoptysis, sputum production and wheezing.   Cardiovascular:  Negative for chest pain, palpitations, orthopnea and leg swelling.  Gastrointestinal:  Negative for abdominal pain, blood in stool, constipation, diarrhea, heartburn, melena, nausea and vomiting.  Genitourinary:  Negative for dysuria, frequency and urgency.  Musculoskeletal:  Positive for back pain and joint pain.  Skin: Negative.  Negative for itching and rash.  Neurological:  Negative for dizziness, tingling, focal weakness, weakness and headaches.  Endo/Heme/Allergies:  Does not bruise/bleed easily.  Psychiatric/Behavioral:  Negative for depression. The patient is not nervous/anxious and does not have insomnia.     MEDICAL HISTORY:  Past Medical History:  Diagnosis Date   Asthma    Cancer (HCC)    prostate   Diabetes mellitus without complication (HCC)    type II   Hyperlipidemia    Hypertension    Sleep apnea    uses Cpap machine     SURGICAL HISTORY: Past Surgical History:  Procedure Laterality Date   CHOLECYSTECTOMY     COLONOSCOPY WITH PROPOFOL N/A 11/18/2018   Procedure: COLONOSCOPY WITH PROPOFOL;  Surgeon: Pasty Spillers, MD;  Location: [facility] ENDOSCOPY;  Service: Gastroenterology;  Laterality: N/A;   COLONOSCOPY WITH PROPOFOL N/A 02/05/2023   Procedure: COLONOSCOPY WITH PROPOFOL;  Surgeon: Toney Reil, MD;  Location: [facility] ENDOSCOPY;   Service: Gastroenterology;  Laterality: N/A;   IR IMAGING GUIDED PORT INSERTION  03/24/2023   IR US LIVER BIOPSY  03/03/2023   LAPAROSCOPIC RETROPUBIC PROSTATECTOMY     2023   LUNG SURGERY     "ligation of thoracic duct"   PROSTATE BIOPSY N/A 09/21/2020   Procedure: PROSTATE BIOPSY [name];  Surgeon: [name], MD;  Location: [facility] ORS;  Service: Urology;  Laterality: N/A;    SOCIAL HISTORY: Social History   Socioeconomic History   Marital status: Married    Spouse name: Not on file   Number of children: Not on file   Years of education: Not on file   Highest education level: Not on file  Occupational History   Occupation: Automotive engineer   Tobacco Use   Smoking status: Never   Smokeless tobacco: Never  Vaping Use   Vaping status: Never Used  Substance and Sexual Activity   Alcohol use: No   Drug use: No   Sexual activity: Not on file  Other Topics Concern   Not on file  Social History Narrative   Not on file   Social Drivers of Health   Financial Resource Strain: Not on file  Food Insecurity: No Food Insecurity (04/02/2023)   Hunger Vital Sign    Worried About Running Out of Food in the Last Year: Never true    Ran Out of Food in the Last Year: Never true  Transportation Needs: No Transportation Needs (04/02/2023)   PRAPARE - Administrator, Civil Service (Medical): No    Lack of Transportation (Non-Medical): No  Physical Activity: Not on file  Stress: Not on file  Social Connections: Not on file  Intimate Partner Violence: Not At Risk (04/02/2023)   Humiliation, Afraid, Rape, and Kick questionnaire    Fear of Current or Ex-Partner: No    Emotionally Abused: No    Physically Abused: No    Sexually Abused: No    FAMILY HISTORY: Family History  Problem Relation Age of Onset   Allergies Sister     ALLERGIES:  is allergic to other, morphine and codeine, tree extract, and wound dressing adhesive.  MEDICATIONS:  Current Outpatient Medications   Medication Sig Dispense Refill   acetaminophen (TYLENOL) 500 MG tablet Take 500 mg by mouth every 6 (six) hours as needed.     ALPRAZolam (XANAX) 0.5 MG tablet Take 1 tablet (0.5 mg total) by mouth 2 (two) times daily as needed for anxiety. 10 tablet 0   amLODipine (NORVASC) 10 MG tablet Take 10 mg by mouth daily.  1   apixaban (ELIQUIS) 5 MG TABS tablet Take 2 tablets (10 mg total) by mouth 2 (two) times daily for 5 days, THEN 1 tablet (5 mg total) 2 (two) times daily for 25 days. 70 tablet 0   atorvastatin (LIPITOR) 10 MG tablet Take 10 mg by mouth daily.     feeding supplement (ENSURE ENLIVE / ENSURE PLUS) LIQD Take 237 mLs by mouth 2 (two) times daily between meals. 14220 mL 0   fentaNYL (DURAGESIC) 12 MCG/HR Place 1 patch onto the skin every 3 (three) days. 10 patch 0   HYDROmorphone (DILAUDID) 2 MG tablet Take 2 tablets (4 mg total) by mouth every 4 (four) hours as needed for severe pain (pain score 7-10). 180 tablet 0   HYDROmorphone (DILAUDID) 4 MG tablet Take 1 tablet (4 mg total) by mouth every 4 (four) hours as needed for severe pain (pain score 7-10). 90 tablet 0   insulin aspart (NOVOLOG) 100 UNIT/ML FlexPen Inject 5 Units into the skin 3 (three) times daily with meals. If eating and Blood Glucose (BG) 80 or higher inject 5 units for meal coverage and add correction dose per scale. If not eating, correction dose only. BG <150= 0 unit; BG 150-200= 1 unit; BG 201-250= 2 unit; BG 251-300= 3 unit; BG 301-350= 4 unit; BG 351-400= 5 unit; BG >400= 6 unit and Call Primary care. 15 mL 0   JARDIANCE 25 MG TABS tablet Take 25 mg by mouth daily.     lidocaine-prilocaine (EMLA) cream Apply on the port. 30 -45 min  prior to port access. 30 g 3   losartan (COZAAR) 100 MG tablet Take 100 mg by mouth daily.  1   metoprolol tartrate (LOPRESSOR) 50 MG tablet Take 1 tablet (50 mg total) by mouth 2 (two) times daily. 60  tablet 1   mometasone-formoterol (DULERA) 100-5 MCG/ACT AERO Inhale 2 puffs into the  lungs 2 (two) times daily as needed for wheezing or shortness of breath.     naloxone (NARCAN) nasal spray 4 mg/0.1 mL One spray in nostril for unresponsiveness 2 each 0   OLANZapine (ZYPREXA) 5 MG tablet Take 1 tablet (5 mg total) by mouth at bedtime. 30 tablet 3   Omega-3 Fatty Acids (FISH OIL) 1000 MG CAPS Take 1,000 mg by mouth daily.     RYBELSUS 7 MG TABS Take 1 tablet by mouth daily.     ondansetron (ZOFRAN) 8 MG tablet One pill every 8 hours as needed for nausea/vomitting. (Patient not taking: Reported on 04/01/2023) 40 tablet 1   prochlorperazine (COMPAZINE) 10 MG tablet Take 1 tablet (10 mg total) by mouth every 6 (six) hours as needed for nausea or vomiting. (Patient not taking: Reported on 04/01/2023) 40 tablet 1   No current facility-administered medications for this visit.   Facility-Administered Medications Ordered in Other Visits  Medication Dose Route Frequency Provider Last Rate Last Admin   0.9 %  sodium chloride infusion   Intravenous Continuous Earna Coder, MD 10 mL/hr at 04/11/23 1105 New Bag at 04/11/23 1105   gemcitabine (GEMZAR) 1,824 mg in sodium chloride 0.9 % 250 mL chemo infusion  1,000 mg/m2 (Treatment Plan Recorded) Intravenous Once Earna Coder, MD        PHYSICAL EXAMINATION:   Vitals:   04/11/23 1007  BP: 113/72  Pulse: 97  Resp: 18  Temp: 99.1 F (37.3 C)  SpO2: 96%     Filed Weights   04/11/23 1007  Weight: 155 lb 9.6 oz (70.6 kg)      Physical Exam Vitals and nursing note reviewed.  HENT:     Head: Normocephalic and atraumatic.     Mouth/Throat:     Pharynx: Oropharynx is clear.  Eyes:     Extraocular Movements: Extraocular movements intact.     Pupils: Pupils are equal, round, and reactive to light.  Cardiovascular:     Rate and Rhythm: Normal rate and regular rhythm.  Pulmonary:     Comments: Decreased breath sounds bilaterally.  Abdominal:     Palpations: Abdomen is soft.  Musculoskeletal:        General:  Normal range of motion.     Cervical back: Normal range of motion.  Skin:    General: Skin is warm.  Neurological:     General: No focal deficit present.     Mental Status: He is alert and oriented to person, place, and time.  Psychiatric:        Behavior: Behavior normal.        Judgment: Judgment normal.     LABORATORY DATA:  I have reviewed the data as listed Lab Results  Component Value Date   WBC 22.6 (H) 04/11/2023   HGB 9.5 (L) 04/11/2023   HCT 32.3 (L) 04/11/2023   MCV 85.4 04/11/2023   PLT 471 (H) 04/11/2023   Recent Labs    03/07/23 0248 03/12/23 0846 03/26/23 0813 03/31/23 2200 04/01/23 0547 04/04/23 0531 04/05/23 0442 04/11/23 0931  NA  --    < > 136 139   < > 142 138 134*  K  --    < > 4.6 4.5   < > 3.7 3.6 4.1  CL  --    < > 105 109   < > 111 109 104  CO2  --    < >  25 19*   < > 23 23 22   GLUCOSE  --    < > 273* 198*   < > 152* 158* 217*  BUN  --    < > 19 31*   < > 14 15 17   CREATININE  --    < > 0.52* 0.76   < > 0.47* 0.46* 0.55*  CALCIUM  --    < > 10.0 9.0   < > 8.6* 8.6* 10.1  GFRNONAA  --    < > >60 >60   < > >60 >60 >60  PROT 6.5   < > 6.3* 6.5  --   --   --  6.3*  ALBUMIN 2.0*   < > 2.2* 2.1*  --   --   --  1.8*  AST 31   < > 22 23  --   --   --  32  ALT 37   < > 19 28  --   --   --  19  ALKPHOS 463*   < > 591* 670*  --   --   --  825*  BILITOT 0.5   < > 0.7 1.2*  --   --   --  0.7  BILIDIR 0.1  --   --   --   --   --   --   --   IBILI 0.4  --   --   --   --   --   --   --    < > = values in this interval not displayed.    RADIOGRAPHIC STUDIES: I have personally reviewed the radiological images as listed and agreed with the findings in the report. DG Swallowing Func-Speech Pathology Result Date: 04/04/2023 Table formatting from the original result was not included. Modified Barium Swallow Study Patient Details Name: Tony Benson MRN: [id number] Date of Birth: 1960-07-17 Today's Date: 04/04/2023 HPI/PMH: HPI: Pt is a 62 y.o. male with  medical history significant for Metastatic pancreatic Cancer on Palliative Chemotherapy, asthma, prostate Cancer, type 2 diabetes mellitus, hypertension, dyslipidemia, and PE on Eliquis, who presented to the emergency room with acute onset of dyspnea with associated cough productive of yellow sputum without wheezing.  Today patient was found to have hemoglobin 6.5.  He agreed for blood transfusion however during transfusion he developed worsening shortness of breath with tachycardia.  Transfusion was aborted and blood return to blood bank for transfusion reaction workup.  Patient has also been seen by cardiologist and awaiting repeat echo before decision is made on Large pericardial effusion, lung metastasis per MD note.  Per Oncology note: Patient was hospitalized November 2024 with shortness of breath/acute PE, bilateral DVT.  CT of the abdomen and pelvis found to have a pancreatic head mass and liver masses.  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  Patient is status post 2 cycles of gemcitabine and Abraxane.  Now admitted to the hospital with pneumonia.  Palliative Care was consulted for GOC.   Chest CT: Large pericardial effusion increased compared to 02/27/2023.  Clinical correlation recommended to exclude tamponade physiology.  3. Increased bilateral centrilobular micro nodules and tree-in-bud  opacities with more confluence opacities in the right lung, likely  due to bronchopneumonia from aspiration. Lymphangitic spread of  tumor could appear similarly.  4. Irregular pleural thickening or loculated pleural effusion in the  lower right chest has increased compared to 02/27/2023. There is  associated pleural nodularity in the posterior lower  lobe. This may be due to bronchopneumonia though metastases are not excluded. Clinical Impression: Clinical Impression: Pt presents with profound oropharyngeal dysphagia that is likely related to overall deconditioned state.  As such he is at a very high risk of aspiration, malnutrition and dehydration when consuming POs. Specifically, when consuming thin liquids via spoon, nectar thick liquids via spoon and small cup sips as well as small boluses of puree, he presents with sensed and silent aspiration of all consistencies. When aspiration is sensed, he produced a delayed weak non-productive cough that was not effective in clearing aspirates or laryngeal penetrates. As such, would recommend NPO and discussion regarding GOC. Recommend extensive oral care to reduce the bacterial load of pt's salvia as his risk of aspirating his own salvia is high.     Analysis of swallow function:  Oral phase is c/b decrease propulsion of boluses over base of tongue  Pharyngeal phase is c/b decreased base of tongue strength, weak hyoid movement, decreased epiglottic deflection, delayed swallow initiation to leave of pyriform sinuses and weak pharyngeal parastasis resulting in reduced UES opening and passage of boluses as well s gross widespread pharyngeal residue throughout pharynx. Boluses were observed to be resting within the laryngeal vestibule and on pt's vocal cords with no ability to clear these penetrates even with a cued cough. Aspiration was gross when consuming thin liquids via spoon d/t delayed pharyngeal swallow. While less of the nectar and puree were aspirated, pharyngeal residue was greater resulting in continued build-up of penetrates. Factors that may increase risk of adverse event in presence of aspiration Rubye Oaks & Clearance Coots 2021): Factors that may increase risk of adverse event in presence of aspiration Rubye Oaks & Clearance Coots 2021): Poor general health and/or compromised immunity; Reduced cognitive function; Limited mobility; Frail or deconditioned; Dependence for feeding and/or oral hygiene; Inadequate oral hygiene; Reduced saliva; Weak cough; Frequent aspiration of large volumes Recommendations/Plan: Swallowing Evaluation Recommendations  Swallowing Evaluation Recommendations Recommendations: NPO Medication Administration: Via alternative means Oral care recommendations: Oral care QID (4x/day) Recommended consults: Consider Palliative care Caregiver Recommendations: -- (TBD) Treatment Plan Treatment Plan Treatment recommendations: No treatment recommended at this time Follow-up recommendations: No SLP follow up Recommendations Comment: Palliative Care f/u for GOC Recommendations Recommendations for follow up therapy are one component of a multi-disciplinary discharge planning process, led by the attending physician.  Recommendations may be updated based on patient status, additional functional criteria and insurance authorization. Assessment: Orofacial Exam: Orofacial Exam Oral Cavity: Oral Hygiene: Xerostomia Oral Cavity - Dentition: Adequate natural dentition Orofacial Anatomy: WFL Oral Motor/Sensory Function: WFL Anatomy: Anatomy: WFL Boluses Administered: Boluses Administered Boluses Administered: Thin liquids (Level 0); Mildly thick liquids (Level 2, nectar thick); Puree  Oral Impairment Domain: Oral Impairment Domain Lip Closure: No labial escape Tongue control during bolus hold: Not tested Bolus preparation/mastication: Slow prolonged chewing/mashing with complete recollection Bolus transport/lingual motion: Delayed initiation of tongue motion (oral holding); Slow tongue motion Oral residue: Residue collection on oral structures Location of oral residue : Floor of mouth; Tongue Initiation of pharyngeal swallow : Pyriform sinuses  Pharyngeal Impairment Domain: Pharyngeal Impairment Domain Soft palate elevation: No bolus between soft palate (SP)/pharyngeal wall (PW) Laryngeal elevation: Partial superior movement of thyroid cartilage/partial approximation of arytenoids to epiglottic petiole Anterior hyoid excursion: Partial anterior movement; No anterior movement Epiglottic movement: Partial inversion; No inversion Laryngeal vestibule closure:  None, wide column air/contrast in laryngeal vestibule; Incomplete, narrow column air/contrast in laryngeal vestibule Pharyngeal stripping wave : Present - diminished Pharyngoesophageal segment opening: Minimal distention/minimal duration,  marked obstruction of flow; Partial distention/partial duration, partial obstruction of flow Tongue base retraction: Narrow column of contrast or air between tongue base and PPW; Wide column of contrast or air between tongue base and PPW Pharyngeal residue: Majority of contrast within or on pharyngeal structures Location of pharyngeal residue: Tongue base; Valleculae; Pharyngeal wall; Pyriform sinuses  Esophageal Impairment Domain: Esophageal Impairment Domain Esophageal clearance upright position: Complete clearance, esophageal coating Pill: No data recorded Penetration/Aspiration Scale Score: Penetration/Aspiration Scale Score 7.  Material enters airway, passes BELOW cords and not ejected out despite cough attempt by patient: Thin liquids (Level 0); Mildly thick liquids (Level 2, nectar thick); Puree 8.  Material enters airway, passes BELOW cords without attempt by patient to eject out (silent aspiration) : Thin liquids (Level 0); Mildly thick liquids (Level 2, nectar thick); Puree Compensatory Strategies: No data recorded  General Information: Caregiver present: No  Diet Prior to this Study: Dysphagia 3 (mechanical soft); Thin liquids (Level 0)   Temperature : Normal   Respiratory Status: WFL   Supplemental O2: None (Room air)   History of Recent Intubation: No  Behavior/Cognition: Alert; Cooperative; Distractible; Requires cueing Self-Feeding Abilities: Needs assist with self-feeding Baseline vocal quality/speech: Normal Volitional Cough: Able to elicit Volitional Swallow: Able to elicit Exam Limitations: No limitations Goal Planning: Prognosis for improved oropharyngeal function: -- (Poor) Barriers to Reach Goals: Cognitive deficits; Time post onset; Severity of deficits;  Overall medical prognosis Barriers/Prognosis Comment: Frail; Drowsy; Pain/pain meds; Cancer w/ Metastasis Patient/Family Stated Goal: none stated Consulted and agree with results and recommendations: Physician; Nurse Pain: Pain Assessment Pain Assessment: Faces Faces Pain Scale: 2 Pain Location: R shoulder; BLEs Pain Descriptors / Indicators: Aching Pain Intervention(s): Monitored during session; Premedicated before session End of Session: Start Time:SLP Start Time (ACUTE ONLY): 1430 Stop Time: SLP Stop Time (ACUTE ONLY): 1450 Time Calculation:SLP Time Calculation (min) (ACUTE ONLY): 20 min Charges: SLP Evaluations $ SLP Speech Visit: 1 Visit SLP Evaluations $BSS Swallow: 1 Procedure $MBS Swallow: 1 Procedure $Self Care/Home Management: 8-22 SLP visit diagnosis: SLP Visit Diagnosis: Dysphagia, oropharyngeal phase (R13.12) Past Medical History: Past Medical History: Diagnosis Date  Asthma   Cancer (HCC)   prostate  Diabetes mellitus without complication (HCC)   type II  Hyperlipidemia   Hypertension   Sleep apnea   uses Cpap machine  Past Surgical History: Past Surgical History: Procedure Laterality Date  CHOLECYSTECTOMY    COLONOSCOPY WITH PROPOFOL N/A 11/18/2018  Procedure: COLONOSCOPY WITH PROPOFOL;  Surgeon: Pasty Spillers, MD;  Location: [facility] ENDOSCOPY;  Service: Gastroenterology;  Laterality: N/A;  COLONOSCOPY WITH PROPOFOL N/A 02/05/2023  Procedure: COLONOSCOPY WITH PROPOFOL;  Surgeon: Toney Reil, MD;  Location: [facility] ENDOSCOPY;  Service: Gastroenterology;  Laterality: N/A;  IR IMAGING GUIDED PORT INSERTION  03/24/2023  IR US LIVER BIOPSY  03/03/2023  LAPAROSCOPIC RETROPUBIC PROSTATECTOMY    2023  LUNG SURGERY    "ligation of thoracic duct"  PROSTATE BIOPSY N/A 09/21/2020  Procedure: PROSTATE BIOPSY [name];  Surgeon: [name], MD;  Location: [facility] ORS;  Service: Urology;  Laterality: N/A; Happi Overton 04/04/2023, 3:25 PM  US Venous Img Lower Bilateral (DVT) Result Date:  04/02/2023 CLINICAL DATA:  BILATERAL lower extremity pain times months. [id number] Lower extremity pain [id number] EXAM: BILATERAL LOWER EXTREMITY VENOUS DOPPLER ULTRASOUND TECHNIQUE: Gray-scale sonography with graded compression, as well as color Doppler and duplex ultrasound were performed to evaluate the lower extremity deep venous systems from the level of the common femoral vein and including the common femoral, femoral,  profunda femoral, popliteal and calf veins including the posterior tibial, peroneal and gastrocnemius veins when visible. The superficial great saphenous vein was also interrogated. Spectral Doppler was utilized to evaluate flow at rest and with distal augmentation maneuvers in the common femoral, femoral and popliteal veins. COMPARISON:  Lower extremity venous duplex, most recently 03/25/2023, POSITIVE for BILATERAL lower extremity DVT of the RIGHT femoral and LEFT popliteal veins. FINDINGS: RIGHT LOWER EXTREMITY VENOUS Normal compressibility of the RIGHT common femoral and visualized portions of profunda femoral vein and great saphenous veins. Similar appearance of near-occlusive filling defect involving the RIGHT femoral, popliteal and visualized portions of the calf veins OTHER No evidence of superficial thrombophlebitis or abnormal fluid collection. Limitations: none LEFT LOWER EXTREMITY VENOUS Normal compressibility of the LEFT common femoral, superficial femoral, profunda femoral vein and great saphenous veins. Similar appearance of near-occlusive filling defect within the LEFT popliteal and visualized portions of the calf veins. OTHER No evidence of superficial thrombophlebitis or abnormal fluid collection. Limitations: none IMPRESSION: Since lower extremity venous duplex dated 03/25/2023; 1. No new or worsening DVT within either lower extremity. 2. Similar appearance of BILATERAL lower extremity DVT, extending from the RIGHT femoral through the calf veins and LEFT popliteal through the calf  veins Roanna Banning, MD [facility] Radiology Electronically Signed   By: Roanna Banning M.D.   On: 04/02/2023 13:38   ECHOCARDIOGRAM COMPLETE Result Date: 04/01/2023    ECHOCARDIOGRAM REPORT   Patient Name:   Tony Benson Date of Exam: 04/01/2023 Medical Rec #:  [id number]     Height:       70.0 in Accession #:    [id number]    Weight:       149.0 lb Date of Birth:  [birth year]    BSA:          1.842 m Patient Age:    62 years      BP:           109/52 mmHg Patient Gender: M             HR:           100 bpm. Exam Location:  [facility] Procedure: 2D Echo, Cardiac Doppler and Color Doppler Indications:     Pericardial Effusion I31.3  History:         Patient has prior history of Echocardiogram examinations, most                  recent 03/04/2023. Risk Factors:Diabetes, Hypertension and                  Sleep Apnea.  Sonographer:     Cristela Blue Referring Phys:  [id number] JAN A MANSY Diagnosing Phys: Yvonne Kendall MD IMPRESSIONS  1. Left ventricular ejection fraction, by estimation, is 60 to 65%. The left ventricle has normal function. The left ventricle has no regional wall motion abnormalities. There is moderate left ventricular hypertrophy. Left ventricular diastolic parameters are consistent with Grade II diastolic dysfunction (pseudonormalization).  2. Right ventricular systolic function is normal. The right ventricular size is normal. There is mildly elevated pulmonary artery systolic pressure.  3. There is equivocal early diastolic collapse of the right ventricle. However, mitral and tricuspid valve inflow velocity respiratory variation is not consistent with tamponade physiology.. Large pericardial effusion. The pericardial effusion is circumferential.  4. The mitral valve is normal in structure. Trivial mitral valve regurgitation. No evidence of mitral stenosis.  5. The aortic valve is tricuspid.  Aortic valve regurgitation is not visualized. No aortic stenosis is  present.  6. The inferior vena cava is normal in size with <50% respiratory variability, suggesting right atrial pressure of 8 mmHg. Comparison(s): A prior study was performed on 03/04/2023. The pericardial effusion appears somewhat larger. FINDINGS  Left Ventricle: Left ventricular ejection fraction, by estimation, is 60 to 65%. The left ventricle has normal function. The left ventricle has no regional wall motion abnormalities. The left ventricular internal cavity size was normal in size. There is  moderate left ventricular hypertrophy. Left ventricular diastolic parameters are consistent with Grade II diastolic dysfunction (pseudonormalization). Right Ventricle: The right ventricular size is normal. No increase in right ventricular wall thickness. Right ventricular systolic function is normal. There is mildly elevated pulmonary artery systolic pressure. The tricuspid regurgitant velocity is 3.00  m/s, and with an assumed right atrial pressure of 8 mmHg, the estimated right ventricular systolic pressure is 44.0 mmHg. Left Atrium: Left atrial size was normal in size. Right Atrium: Right atrial size was normal in size. Pericardium: There is equivocal early diastolic collapse of the right ventricle. However, mitral and tricuspid valve inflow velocity respiratory variation is not consistent with tamponade physiology. A large pericardial effusion is present. The pericardial effusion is circumferential. Mitral Valve: The mitral valve is normal in structure. Trivial mitral valve regurgitation. No evidence of mitral valve stenosis. MV peak gradient, 4.8 mmHg. The mean mitral valve gradient is 3.0 mmHg. Tricuspid Valve: The tricuspid valve is normal in structure. Tricuspid valve regurgitation is mild. Aortic Valve: The aortic valve is tricuspid. Aortic valve regurgitation is not visualized. No aortic stenosis is present. Aortic valve mean gradient measures 4.0 mmHg. Aortic valve peak gradient measures 6.0 mmHg. Aortic  valve area, by VTI measures 2.91 cm. Pulmonic Valve: The pulmonic valve was grossly normal. Pulmonic valve regurgitation is trivial. No evidence of pulmonic stenosis. Aorta: The aortic root is normal in size and structure. Pulmonary Artery: The pulmonary artery is not well seen. Venous: The inferior vena cava is normal in size with less than 50% respiratory variability, suggesting right atrial pressure of 8 mmHg. IAS/Shunts: No atrial level shunt detected by color flow Doppler.  LEFT VENTRICLE PLAX 2D LVIDd:         4.10 cm   Diastology LVIDs:         1.80 cm   LV e' medial:    6.42 cm/s LV PW:         1.50 cm   LV E/e' medial:  13.8 LV IVS:        1.20 cm   LV e' lateral:   15.00 cm/s LVOT diam:     2.10 cm   LV E/e' lateral: 5.9 LV SV:         58 LV SV Index:   32 LVOT Area:     3.46 cm  RIGHT VENTRICLE RV Basal diam:  2.90 cm RV Mid diam:    2.50 cm RV S prime:     20.90 cm/s TAPSE (M-mode): 1.8 cm LEFT ATRIUM           Index        RIGHT ATRIUM          Index LA diam:      2.40 cm 1.30 cm/m   RA Area:     9.46 cm LA Vol (A4C): 20.3 ml 11.02 ml/m  RA Volume:   14.30 ml 7.76 ml/m  AORTIC VALVE AV Area (Vmax):    2.70 cm  AV Area (Vmean):   2.44 cm AV Area (VTI):     2.91 cm AV Vmax:           122.00 cm/s AV Vmean:          90.900 cm/s AV VTI:            0.200 m AV Peak Grad:      6.0 mmHg AV Mean Grad:      4.0 mmHg LVOT Vmax:         95.00 cm/s LVOT Vmean:        64.000 cm/s LVOT VTI:          0.168 m LVOT/AV VTI ratio: 0.84  AORTA Ao Root diam: 3.05 cm MITRAL VALVE               TRICUSPID VALVE MV Area (PHT): 4.57 cm    TR Peak grad:   36.0 mmHg MV Area VTI:   2.32 cm    TR Vmax:        300.00 cm/s MV Peak grad:  4.8 mmHg MV Mean grad:  3.0 mmHg    SHUNTS MV Vmax:       1.09 m/s    Systemic VTI:  0.17 m MV Vmean:      80.1 cm/s   Systemic Diam: 2.10 cm MV Decel Time: 166 msec MV E velocity: 88.60 cm/s MV A velocity: 78.10 cm/s MV E/A ratio:  1.13 Yvonne Kendall MD Electronically signed by Yvonne Kendall MD Signature Date/Time: 04/01/2023/2:19:37 PM    Final    CT Angio Chest PE W/Cm &/Or Wo Cm Result Date: 04/01/2023 CLINICAL DATA:  Cough and fever for 3 days. On chemo for pancreatic cancer. Elevated heart rate. PE suspected. EXAM: CT ANGIOGRAPHY CHEST WITH CONTRAST TECHNIQUE: Multidetector CT imaging of the chest was performed using the standard protocol during bolus administration of intravenous contrast. Multiplanar CT image reconstructions and MIPs were obtained to evaluate the vascular anatomy. RADIATION DOSE REDUCTION: This exam was performed according to the departmental dose-optimization program which includes automated exposure control, adjustment of the mA and/or kV according to patient size and/or use of iterative reconstruction technique. CONTRAST:  75mL OMNIPAQUE IOHEXOL 350 MG/ML SOLN COMPARISON:  Same day chest radiograph and CTA chest 04/28/2022 FINDINGS: Cardiovascular: Right IJ CVC tip at the superior cavoatrial junction. Negative for acute pulmonary embolism. Large pericardial effusion increased compared to 02/27/2023. Clinical correlation recommended to exclude tamponade physiology. Mild aortic atherosclerotic calcification. Mediastinum/Nodes: Debris layering within the posterior trachea. Esophagus is unremarkable. Surgical clips adjacent to the lower esophagus. Mediastinal and hilar adenopathy is not well evaluated. Lungs/Pleura: Diffuse bronchial wall thickening scattered mucous plugging greatest in the right lower lobe. Bilateral centrilobular micro nodules and tree-in-bud opacities with more confluence opacities in the right lung. Irregular pleural thickening or loculated pleural effusion in the lower right chest has increased compared to 02/27/2023. There is associated pleural nodularity in the posterior lower lobe. For example 14 x 9 mm nodule on series 5/image 97. Pleural calcifications in the posterior right lung base. No pneumothorax. Upper Abdomen: Incompletely evaluated large  metastasis in the right hepatic lobe appears slightly increased compared to 02/27/2023, measuring up to 14.5 cm previously 13.3 cm. Partially visualized dilated main pancreatic duct. Pancreatic head is not included in the exam. Small amount of perisplenic ascites. Musculoskeletal: No acute fracture or destructive osseous lesion. Review of the MIP images confirms the above findings. IMPRESSION: 1. Negative for acute pulmonary embolism. 2. Large pericardial effusion increased compared  to 02/27/2023. Clinical correlation recommended to exclude tamponade physiology. 3. Increased bilateral centrilobular micro nodules and tree-in-bud opacities with more confluence opacities in the right lung, likely due to bronchopneumonia from aspiration. Lymphangitic spread of tumor could appear similarly. 4. Irregular pleural thickening or loculated pleural effusion in the lower right chest has increased compared to 02/27/2023. There is associated pleural nodularity in the posterior lower lobe. This may be due to bronchopneumonia though metastases are not excluded. 5. 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 Aortic Atherosclerosis (ICD10-I70.0). Electronically Signed   By: Minerva Fester M.D.   On: 04/01/2023 00:53   DG Chest 2 View Result Date: 03/31/2023 CLINICAL DATA:  Cough and fever EXAM: CHEST - 2 VIEW COMPARISON:  Chest x-ray 03/05/2023.  Chest CT 02/27/2023. FINDINGS: There is a new right chest port with distal catheter tip projecting over the distal SVC. The cardiac silhouette appears mildly enlarged, unchanged. Small right pleural effusion persists. Patchy opacities in the right lung base have decreased, but have not completely resolved. There is a new focal nodular density in the left mid lung measuring 8 mm. The left lung is otherwise clear. There is no pneumothorax or acute fracture. IMPRESSION: 1. Small right pleural effusion persists. 2. Patchy opacities in the right lung base have decreased, but have not  completely resolved. 3. New 8 mm nodular density in the left mid lung. Recommend follow-up chest CT. Electronically Signed   By: Darliss Cheney M.D.   On: 03/31/2023 21:10   NM Bone Scan Whole Body Result Date: 03/30/2023 CLINICAL DATA:  Metastatic pancreatic cancer. Evaluate for bone disease. EXAM: NUCLEAR MEDICINE WHOLE BODY BONE SCAN TECHNIQUE: Whole body anterior and posterior images were obtained approximately 3 hours after intravenous injection of radiopharmaceutical. RADIOPHARMACEUTICALS:  21.78 mCi Technetium-73m MDP IV COMPARISON:  CTA chest 02/27/2023 and abdominal MRI 02/28/2023 FINDINGS: No abnormal uptake involving the axial or appendicular skeleton. Expected uptake in the renal collecting systems and urinary bladder. IMPRESSION: Negative bone scan.  No evidence for metastatic bone disease. Electronically Signed   By: Richarda Overlie M.D.   On: 03/30/2023 21:01   US Venous Img Lower Bilateral Result Date: 03/25/2023 CLINICAL DATA:  Follow-up lower extremity DVT. History of prostate cancer. Patient is on anticoagulation. EXAM: BILATERAL LOWER EXTREMITY VENOUS DOPPLER ULTRASOUND TECHNIQUE: Gray-scale sonography with graded compression, as well as color Doppler and duplex ultrasound were performed to evaluate the lower extremity deep venous systems from the level of the common femoral vein and including the common femoral, femoral, profunda femoral, popliteal and calf veins including the posterior tibial, peroneal and gastrocnemius veins when visible. The superficial great saphenous vein was also interrogated. Spectral Doppler was utilized to evaluate flow at rest and with distal augmentation maneuvers in the common femoral, femoral and popliteal veins. COMPARISON:  Bilateral lower extremity venous Doppler ultrasound-02/26/2023 (positive for DVT extending from right femoral vein through right tibial veins as well as the left popliteal vein through the left tibial veins) FINDINGS: RIGHT LOWER EXTREMITY  Common Femoral Vein: No evidence of thrombus. Normal compressibility, respiratory phasicity and response to augmentation. Saphenofemoral Junction: No evidence of thrombus. Normal compressibility and flow on color Doppler imaging. Profunda Femoral Vein: No evidence of thrombus. Normal compressibility and flow on color Doppler imaging. Femoral Vein: There is predominantly occlusive DVT involving the proximal (image 41), mid (image 44) and distal (image 47) aspects of the right femoral vein, grossly unchanged compared to the 02/26/2023 examination. Popliteal Vein: Redemonstrated near occlusive DVT involving the right popliteal vein (  images 47 and 50), similar to the 02/26/2023 examination. Calf Veins: Redemonstrated occlusive DVT involving both paired divisions of the left posterior tibial (image 56) as well as both paired divisions of the right peroneal veins (image 58). Superficial Great Saphenous Vein: No evidence of thrombus. Normal compressibility. Other Findings:  None. _________________________________________________________ LEFT LOWER EXTREMITY Common Femoral Vein: No evidence of thrombus. Normal compressibility, respiratory phasicity and response to augmentation. Saphenofemoral Junction: No evidence of thrombus. Normal compressibility and flow on color Doppler imaging. Profunda Femoral Vein: No evidence of thrombus. Normal compressibility and flow on color Doppler imaging. Femoral Vein: No evidence of thrombus. Normal compressibility, respiratory phasicity and response to augmentation. Popliteal Vein: Redemonstrated occlusive DVT involving the left popliteal vein (images 21 and 25), unchanged compared to the 02/26/2023 examination. Calf Veins: Redemonstrated occlusive DVT involving both paired divisions of the left posterior tibial vein (images 27 and 28), unchanged compared to the 02/26/2023 examination. The left peroneal veins appear patent where imaged (image 30), improved compared to the 02/26/2023  examination. Superficial Great Saphenous Vein: No evidence of thrombus. Normal compressibility. Other Findings:  None. IMPRESSION: 1. Redemonstrated bilateral lower extremity DVT extending from the right femoral vein through the right calf veins, grossly unchanged compared to the 02/26/2023 examination. 2. Resolved left peroneal DVT. 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 Electronically Signed   By: Simonne Come M.D.   On: 03/25/2023 12:10   IR IMAGING GUIDED PORT INSERTION Result Date: 03/24/2023 CLINICAL DATA:  Metastatic pancreas CANCER TO THE LIVER EXAM: RIGHT INTERNAL JUGULAR SINGLE LUMEN POWER PORT CATHETER INSERTION Date:  03/24/2023 03/24/2023 11:55 am Radiologist:  M. Ruel Favors, MD Guidance:  Ultrasound and fluoroscopic MEDICATIONS: 1% lidocaine local with epinephrine ANESTHESIA/SEDATION: Versed 0.5 mg IV; Fentanyl 25 mcg IV; Moderate Sedation Time:  30 minutes The patient was continuously monitored during the procedure by the interventional radiology nurse under my direct supervision. FLUOROSCOPY: 0 minutes, 71 seconds (3.4 mGy) COMPLICATIONS: None immediate. CONTRAST:  None. PROCEDURE: Informed consent was obtained from the patient following explanation of the procedure, risks, benefits and alternatives. The patient understands, agrees and consents for the procedure. All questions were addressed. A time out was performed. Maximal barrier sterile technique utilized including caps, mask, sterile gowns, sterile gloves, large sterile drape, hand hygiene, and 2% chlorhexidine scrub. Under sterile conditions and local anesthesia, right internal jugular micropuncture venous access was performed. Access was performed with ultrasound. Images were obtained for documentation of the patent right internal jugular vein. A guide wire was inserted followed by a transitional dilator. This  allowed insertion of a guide wire and catheter into the IVC. Measurements were obtained from the SVC / RA junction back to the right IJ venotomy site. In the right infraclavicular chest, a subcutaneous pocket was created over the second anterior rib. This was done under sterile conditions and local anesthesia. 1% lidocaine with epinephrine was utilized for this. A 2.5 cm incision was made in the skin. Blunt dissection was performed to create a subcutaneous pocket over the right pectoralis major muscle. The pocket was flushed with saline vigorously. There was adequate hemostasis. The port catheter was assembled and checked for leakage. The port catheter was secured in the pocket with two retention sutures. The tubing was tunneled subcutaneously to the right venotomy site and inserted into the SVC/RA junction through a valved peel-away sheath. Position was confirmed with fluoroscopy. Images were obtained for documentation.  The patient tolerated the procedure well. No immediate complications. Incisions were closed in a two layer fashion with 4 - 0 Vicryl suture. Dermabond was applied to the skin. The port catheter was accessed, blood was aspirated followed by saline and heparin flushes. Needle was removed. A dry sterile dressing was applied. IMPRESSION: Ultrasound and fluoroscopically guided right internal jugular single lumen power port catheter insertion. Tip in the SVC/RA junction. Catheter ready for use. Electronically Signed   By: Judie Petit.  Shick M.D.   On: 03/24/2023 12:17    Pancreatic cancer metastasized to liver [facility]) # NOV Metastatic pancreatic head mass-with multiple-liver masses; large pericardial effusion [Dr.End]- highly suspicious for malignancy-status post liver biopsy- Pathology-pancreatico biliary origin.  CA 19-9 normal however CEA elevated; on Gem-Abraxaneq 2 W.   # Labs reviewed.  WBC elevated from 9-22,000 today.  On further history taking, patient denies any symptoms suggestive of ongoing  infection.  He recently completed antibiotic.  He is afebrile.  Could be reactive in nature.  Will proceed with cycle 2-day 1 of gemcitabine and Abraxane.   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  Status post recent antibiotics.  Monitor closely.   # Sacral ulcer- on donut-wife requesting prescription for Xeroform and Mepilex.  Our staff will look into that.   # Pain control- on Fentanyl patch; will check on Dilaudid BTP prn.s/p  palliative care evaluation-; give oxycond today.    # Elevated LFTs-likely secondary malignancy-no obvious signs of any biliary duct obstruction or cholangitis.  Monitor closely.   # cardio-respiratory: Acute PE/bilateral DVT-currently on Eliquis-stable.  Large pericardial effusion [Dr.End]-no cardiac tamponade; no pericardiocentesis monitor for now. Stable.    # Loss of appetite-secondary to malignancy; recommend Zyprexa/ called in. nutrition evaluation:     # port placement. IV mediport- functional    # Family requesting for PT referral which will be placed. Ctcle #1- gem-ab q 2w;3rd cyle- qwx3; q4   # DISPOSITION: # Follow-up in 2 weeks as scheduled with Dr. Leonard Schwartz, labs, gem Abraxane.      Above plan of care was discussed with patient/family in detail.  My contact information was given to the patient/family.     Michaelyn Barter, MD 04/11/2023 12:26 PM

## 2023-04-12 LAB — CEA: CEA: 145 ng/mL — ABNORMAL HIGH (ref 0.0–4.7)

## 2023-04-14 ENCOUNTER — Encounter: Payer: Self-pay | Admitting: Internal Medicine

## 2023-04-14 LAB — TYPE AND SCREEN
ABO/RH(D): O POS
Antibody Screen: NEGATIVE
Unit division: 0
Unit division: 0
Unit division: 0

## 2023-04-14 LAB — BPAM RBC
Blood Product Expiration Date: 202412252359
Blood Product Expiration Date: 202501082359
Blood Product Expiration Date: 202501082359
ISSUE DATE / TIME: 202412171219
ISSUE DATE / TIME: 202412171805
ISSUE DATE / TIME: 202412180002
Unit Type and Rh: 5100
Unit Type and Rh: 5100
Unit Type and Rh: 5100

## 2023-04-15 ENCOUNTER — Inpatient Hospital Stay: Payer: BC Managed Care – PPO

## 2023-04-15 ENCOUNTER — Other Ambulatory Visit: Payer: Self-pay | Admitting: *Deleted

## 2023-04-15 VITALS — BP 118/58 | HR 114 | Temp 97.5°F | Resp 18

## 2023-04-15 DIAGNOSIS — R531 Weakness: Secondary | ICD-10-CM

## 2023-04-15 DIAGNOSIS — C259 Malignant neoplasm of pancreas, unspecified: Secondary | ICD-10-CM

## 2023-04-15 DIAGNOSIS — Z95828 Presence of other vascular implants and grafts: Secondary | ICD-10-CM

## 2023-04-15 DIAGNOSIS — Z5111 Encounter for antineoplastic chemotherapy: Secondary | ICD-10-CM | POA: Diagnosis not present

## 2023-04-15 DIAGNOSIS — G893 Neoplasm related pain (acute) (chronic): Secondary | ICD-10-CM

## 2023-04-15 DIAGNOSIS — E860 Dehydration: Secondary | ICD-10-CM

## 2023-04-15 LAB — CBC WITH DIFFERENTIAL (CANCER CENTER ONLY)
Abs Immature Granulocytes: 0.19 10*3/uL — ABNORMAL HIGH (ref 0.00–0.07)
Basophils Absolute: 0.1 10*3/uL (ref 0.0–0.1)
Basophils Relative: 0 %
Eosinophils Absolute: 0 10*3/uL (ref 0.0–0.5)
Eosinophils Relative: 0 %
HCT: 28.8 % — ABNORMAL LOW (ref 39.0–52.0)
Hemoglobin: 8.7 g/dL — ABNORMAL LOW (ref 13.0–17.0)
Immature Granulocytes: 1 %
Lymphocytes Relative: 1 %
Lymphs Abs: 0.1 10*3/uL — ABNORMAL LOW (ref 0.7–4.0)
MCH: 25.3 pg — ABNORMAL LOW (ref 26.0–34.0)
MCHC: 30.2 g/dL (ref 30.0–36.0)
MCV: 83.7 fL (ref 80.0–100.0)
Monocytes Absolute: 0.3 10*3/uL (ref 0.1–1.0)
Monocytes Relative: 1 %
Neutro Abs: 16.7 10*3/uL — ABNORMAL HIGH (ref 1.7–7.7)
Neutrophils Relative %: 97 %
Platelet Count: 291 10*3/uL (ref 150–400)
RBC: 3.44 MIL/uL — ABNORMAL LOW (ref 4.22–5.81)
RDW: 17.8 % — ABNORMAL HIGH (ref 11.5–15.5)
WBC Count: 17.4 10*3/uL — ABNORMAL HIGH (ref 4.0–10.5)
nRBC: 0 % (ref 0.0–0.2)

## 2023-04-15 LAB — CMP (CANCER CENTER ONLY)
ALT: 28 U/L (ref 0–44)
AST: 38 U/L (ref 15–41)
Albumin: 1.9 g/dL — ABNORMAL LOW (ref 3.5–5.0)
Alkaline Phosphatase: 713 U/L — ABNORMAL HIGH (ref 38–126)
Anion gap: 10 (ref 5–15)
BUN: 28 mg/dL — ABNORMAL HIGH (ref 8–23)
CO2: 22 mmol/L (ref 22–32)
Calcium: 10.4 mg/dL — ABNORMAL HIGH (ref 8.9–10.3)
Chloride: 109 mmol/L (ref 98–111)
Creatinine: 0.7 mg/dL (ref 0.61–1.24)
GFR, Estimated: >60 mL/min (ref >60)
Glucose, Bld: 136 mg/dL — ABNORMAL HIGH (ref 70–99)
Potassium: 4.6 mmol/L (ref 3.5–5.1)
Sodium: 141 mmol/L (ref 135–145)
Total Bilirubin: 1 mg/dL (ref 0.0–1.2)
Total Protein: 6.2 g/dL — ABNORMAL LOW (ref 6.5–8.1)

## 2023-04-15 LAB — MAGNESIUM: Magnesium: 2.3 mg/dL (ref 1.7–2.4)

## 2023-04-15 MED ORDER — HEPARIN SOD (PORK) LOCK FLUSH 100 UNIT/ML IV SOLN
500.0000 [IU] | Freq: Once | INTRAVENOUS | Status: AC
Start: 2023-04-15 — End: 2023-04-15
  Administered 2023-04-15: 500 [IU]
  Filled 2023-04-15: qty 5

## 2023-04-15 MED ORDER — HYDROMORPHONE HCL 1 MG/ML IJ SOLN
0.5000 mg | Freq: Once | INTRAMUSCULAR | Status: AC
  Administered 2023-04-15: 0.5 mg via INTRAVENOUS
  Filled 2023-04-15: qty 1

## 2023-04-15 MED ORDER — SODIUM CHLORIDE 0.9 % IV SOLN
INTRAVENOUS | Status: DC
  Filled 2023-04-15 (×2): qty 250

## 2023-04-15 MED ORDER — SODIUM CHLORIDE 0.9% FLUSH
10.0000 mL | Freq: Once | INTRAVENOUS | Status: AC
Start: 2023-04-15 — End: 2023-04-15
  Administered 2023-04-15: 10 mL via INTRAVENOUS
  Filled 2023-04-15: qty 10

## 2023-04-15 NOTE — Progress Notes (Signed)
 Per Sharia Reeve, NP- when patient returns on Friday- will need to start out at 500 ml /hour.  Patient was also instructed by Sharia Reeve, NP to have patient start on oral Mucinex otc twice daily to help with sputum production.

## 2023-04-15 NOTE — Progress Notes (Signed)
 15-15-review labs with app. Calcium  level 10.4 today. Per v/o josh. Pt needs to return on Thursday or Friday for repeat metb to recheck calcium  level. Possible fluids and possible zometa.  1540-Patient started wheezing and coughing up thick sputum with bloody streaks in mucus.  Iv fluids stopped. Josh, NP pages.   Vitals taken  118/58-bp sitting.  HR 114 oxygen  sats 99%

## 2023-04-15 NOTE — Patient Instructions (Signed)
You may take Mucinex over the counter for your sputum production.

## 2023-04-15 NOTE — Telephone Encounter (Signed)
 Per Sharia Reeve- ok to start with port lab/fluids. I will evaluate patient and smc can be added once patient arrives- if needed. Britta Mccreedy in scheduling will reach out to patient's wife to see when pt can come today.

## 2023-04-16 ENCOUNTER — Encounter: Payer: Self-pay | Admitting: Internal Medicine

## 2023-04-17 ENCOUNTER — Encounter: Payer: Self-pay | Admitting: Hospice and Palliative Medicine

## 2023-04-17 ENCOUNTER — Inpatient Hospital Stay: Payer: BC Managed Care – PPO | Attending: Internal Medicine

## 2023-04-17 ENCOUNTER — Ambulatory Visit
Admission: RE | Admit: 2023-04-17 | Discharge: 2023-04-17 | Disposition: A | Discharge status: Home / Self Care | Payer: BC Managed Care – PPO | Attending: Hospice and Palliative Medicine | Admitting: Hospice and Palliative Medicine

## 2023-04-17 ENCOUNTER — Inpatient Hospital Stay: Payer: BC Managed Care – PPO

## 2023-04-17 ENCOUNTER — Inpatient Hospital Stay (HOSPITAL_BASED_OUTPATIENT_CLINIC_OR_DEPARTMENT_OTHER): Payer: BC Managed Care – PPO | Admitting: Hospice and Palliative Medicine

## 2023-04-17 ENCOUNTER — Telehealth: Payer: Self-pay | Admitting: *Deleted

## 2023-04-17 ENCOUNTER — Ambulatory Visit
Admission: RE | Admit: 2023-04-17 | Discharge: 2023-04-17 | Disposition: A | Discharge status: Home / Self Care | Payer: BC Managed Care – PPO | Source: Ambulatory Visit | Attending: Hospice and Palliative Medicine | Admitting: Hospice and Palliative Medicine

## 2023-04-17 VITALS — BP 118/72 | HR 118 | Temp 98.7°F | Ht 70.0 in | Wt 146.6 lb

## 2023-04-17 DIAGNOSIS — R051 Acute cough: Secondary | ICD-10-CM

## 2023-04-17 DIAGNOSIS — C787 Secondary malignant neoplasm of liver and intrahepatic bile duct: Secondary | ICD-10-CM | POA: Insufficient documentation

## 2023-04-17 DIAGNOSIS — R062 Wheezing: Secondary | ICD-10-CM | POA: Insufficient documentation

## 2023-04-17 DIAGNOSIS — Z8546 Personal history of malignant neoplasm of prostate: Secondary | ICD-10-CM | POA: Insufficient documentation

## 2023-04-17 DIAGNOSIS — R0602 Shortness of breath: Secondary | ICD-10-CM

## 2023-04-17 DIAGNOSIS — Z95828 Presence of other vascular implants and grafts: Secondary | ICD-10-CM

## 2023-04-17 DIAGNOSIS — R64 Cachexia: Secondary | ICD-10-CM | POA: Diagnosis not present

## 2023-04-17 DIAGNOSIS — D701 Agranulocytosis secondary to cancer chemotherapy: Secondary | ICD-10-CM | POA: Diagnosis not present

## 2023-04-17 DIAGNOSIS — Z515 Encounter for palliative care: Secondary | ICD-10-CM

## 2023-04-17 DIAGNOSIS — C259 Malignant neoplasm of pancreas, unspecified: Secondary | ICD-10-CM

## 2023-04-17 DIAGNOSIS — A419 Sepsis, unspecified organism: Secondary | ICD-10-CM | POA: Diagnosis not present

## 2023-04-17 DIAGNOSIS — E860 Dehydration: Secondary | ICD-10-CM

## 2023-04-17 DIAGNOSIS — C250 Malignant neoplasm of head of pancreas: Secondary | ICD-10-CM | POA: Insufficient documentation

## 2023-04-17 DIAGNOSIS — R531 Weakness: Secondary | ICD-10-CM

## 2023-04-17 DIAGNOSIS — Z86718 Personal history of other venous thrombosis and embolism: Secondary | ICD-10-CM | POA: Diagnosis not present

## 2023-04-17 DIAGNOSIS — G893 Neoplasm related pain (acute) (chronic): Secondary | ICD-10-CM

## 2023-04-17 LAB — CBC WITH DIFFERENTIAL (CANCER CENTER ONLY)
Abs Immature Granulocytes: 0 10*3/uL (ref 0.00–0.07)
Basophils Absolute: 0 10*3/uL (ref 0.0–0.1)
Basophils Relative: 3 %
Eosinophils Absolute: 0 10*3/uL (ref 0.0–0.5)
Eosinophils Relative: 0 %
HCT: 28.8 % — ABNORMAL LOW (ref 39.0–52.0)
Hemoglobin: 8.6 g/dL — ABNORMAL LOW (ref 13.0–17.0)
Immature Granulocytes: 0 %
Lymphocytes Relative: 11 %
Lymphs Abs: 0.1 10*3/uL — ABNORMAL LOW (ref 0.7–4.0)
MCH: 25.4 pg — ABNORMAL LOW (ref 26.0–34.0)
MCHC: 29.9 g/dL — ABNORMAL LOW (ref 30.0–36.0)
MCV: 85 fL (ref 80.0–100.0)
Monocytes Absolute: 0 10*3/uL — ABNORMAL LOW (ref 0.1–1.0)
Monocytes Relative: 5 %
Neutro Abs: 0.5 10*3/uL — ABNORMAL LOW (ref 1.7–7.7)
Neutrophils Relative %: 81 %
Platelet Count: 152 10*3/uL (ref 150–400)
RBC: 3.39 MIL/uL — ABNORMAL LOW (ref 4.22–5.81)
RDW: 18 % — ABNORMAL HIGH (ref 11.5–15.5)
Smear Review: ADEQUATE
WBC Count: 0.6 10*3/uL — CL (ref 4.0–10.5)
nRBC: 0 % (ref 0.0–0.2)

## 2023-04-17 LAB — CMP (CANCER CENTER ONLY)
ALT: 23 U/L (ref 0–44)
AST: 27 U/L (ref 15–41)
Albumin: 1.9 g/dL — ABNORMAL LOW (ref 3.5–5.0)
Alkaline Phosphatase: 794 U/L — ABNORMAL HIGH (ref 38–126)
Anion gap: 10 (ref 5–15)
BUN: 28 mg/dL — ABNORMAL HIGH (ref 8–23)
CO2: 21 mmol/L — ABNORMAL LOW (ref 22–32)
Calcium: 10.2 mg/dL (ref 8.9–10.3)
Chloride: 112 mmol/L — ABNORMAL HIGH (ref 98–111)
Creatinine: 0.75 mg/dL (ref 0.61–1.24)
GFR, Estimated: >60 mL/min (ref >60)
Glucose, Bld: 168 mg/dL — ABNORMAL HIGH (ref 70–99)
Potassium: 4.2 mmol/L (ref 3.5–5.1)
Sodium: 143 mmol/L (ref 135–145)
Total Bilirubin: 1.1 mg/dL (ref 0.0–1.2)
Total Protein: 6.3 g/dL — ABNORMAL LOW (ref 6.5–8.1)

## 2023-04-17 MED ORDER — HEPARIN SOD (PORK) LOCK FLUSH 100 UNIT/ML IV SOLN
500.0000 [IU] | Freq: Once | INTRAVENOUS | Status: AC
Start: 2023-04-17 — End: 2023-04-17
  Administered 2023-04-17: 500 [IU]
  Filled 2023-04-17: qty 5

## 2023-04-17 MED ORDER — LEVOFLOXACIN 25 MG/ML PO SOLN: 500.0000 mg | Freq: Every day | ORAL | 0 refills | Status: DC

## 2023-04-17 MED ORDER — SODIUM CHLORIDE 0.9 % IV SOLN
INTRAVENOUS | Status: DC
Start: 2023-04-17 — End: 2023-04-17
  Filled 2023-04-17 (×2): qty 250

## 2023-04-17 MED ORDER — SODIUM CHLORIDE 0.9% FLUSH
10.0000 mL | Freq: Once | INTRAVENOUS | Status: AC
Start: 2023-04-17 — End: 2023-04-17
  Administered 2023-04-17: 10 mL via INTRAVENOUS
  Filled 2023-04-17: qty 10

## 2023-04-17 MED ORDER — HYDROMORPHONE HCL 1 MG/ML IJ SOLN
1.0000 mg | Freq: Once | INTRAMUSCULAR | Status: AC
Start: 2023-04-17 — End: 2023-04-17
  Administered 2023-04-17: 1 mg via INTRAVENOUS
  Filled 2023-04-17: qty 1

## 2023-04-17 NOTE — Telephone Encounter (Signed)
 Rn reached out to pt's wife to discuss mychart msg. Wife concerned that patient may have aspiration pneumonia. Pt not eating/drinking. Pt continues to wheeze and experience shortness of breath, coughing up phlegm. Wife wants to know if pt can be considered for feeding tube and also wants a different opinion from speech therapy.  I spoke with Josh, NP, who recommend pt come to clinic today for port lab/ smc/ fluid clinic- with chest xray prior. NP will discuss goals of care with patient.

## 2023-04-17 NOTE — Telephone Encounter (Signed)
 1355-critical value anc called by Shanda Bumps in cancer center lab. wbc of 0.6. read back process performed with lab tech.  Josh, NP informed of critical value at 1400 Read back process performed with NP.

## 2023-04-17 NOTE — Progress Notes (Signed)
 Symptom Management Clinic [facility] at [facility] [phone] [phone]  Patient Care Team: Weyman Bright, MD as PCP - General (Internal Medicine) End, Lonni, MD as PCP - Cardiology (Cardiology) Gladis Cough, MD as Consulting Physician (General Surgery) Rennie Cindy SAUNDERS, MD as Consulting Physician (Oncology) Maurie Rayfield BIRCH, RN as Oncology Nurse Navigator   NAME OF PATIENT: Tony Benson  [id number]  Sep 12, 1960   DATE OF VISIT: 04/17/23  REASON FOR CONSULT: Tony Benson is a 63 y.o. male with multiple medical problems including OSA on CPAP, diabetes.  Patient was hospitalized November 2024 with shortness of breath/acute PE, bilateral DVT.  Found to have stage IV pancreatic cancer with liver metastasis.  INTERVAL HISTORY: Patient hospitalized 03/31/2023 to 04/05/2023 with aspiration pneumonia.  He saw Dr. Clista on 04/11/2023 for cycle 2 gemcitabine  and Abraxane .  Patient presents to [facility] today with continued complaint of poor oral intake and weakness.  Continued difficulty swallowing.  Patient had swallow study during his hospitalization with recommendation for n.p.o.  Family want to discuss feeding tube.  Denies recent fevers or illnesses. Denies any easy bleeding or bruising. Denies chest pain. Denies any nausea, vomiting, constipation, or diarrhea. Denies urinary complaints. Patient offers no further specific complaints today.  PAST MEDICAL HISTORY: Past Medical History:  Diagnosis Date   Asthma    Cancer (HCC)    prostate   Diabetes mellitus without complication (HCC)    type II   Hyperlipidemia    Hypertension    Sleep apnea    uses Cpap machine     PAST SURGICAL HISTORY:  Past Surgical History:  Procedure Laterality Date   CHOLECYSTECTOMY     COLONOSCOPY WITH PROPOFOL  N/A 11/18/2018   Procedure: COLONOSCOPY WITH PROPOFOL ;  Surgeon: Janalyn Keene NOVAK, MD;  Location: [facility] ENDOSCOPY;  Service:  Gastroenterology;  Laterality: N/A;   COLONOSCOPY WITH PROPOFOL  N/A 02/05/2023   Procedure: COLONOSCOPY WITH PROPOFOL ;  Surgeon: Unk Corinn Skiff, MD;  Location: [facility] ENDOSCOPY;  Service: Gastroenterology;  Laterality: N/A;   IR IMAGING GUIDED PORT INSERTION  03/24/2023   IR US  LIVER BIOPSY  03/03/2023   LAPAROSCOPIC RETROPUBIC PROSTATECTOMY     2023   LUNG SURGERY     ligation of thoracic duct   PROSTATE BIOPSY N/A 09/21/2020   Procedure: PROSTATE BIOPSY GRAYCE;  Surgeon: Kassie Ozell SAUNDERS, MD;  Location: [facility] ORS;  Service: Urology;  Laterality: N/A;    HEMATOLOGY/ONCOLOGY HISTORY:  Oncology History Overview Note   # NOV 2024- Liver, needle/core biopsy,  :      - METASTATIC CARCINOMA      - SEE NOTE       Diagnosis Note : The patient's clinical history of prostate cancer, recent      pleural effusion and MRI findings significant for a large head of pancreas mass      and multiple liver masses are noted.  Immunohistochemistry is performed on block      1B.  The tumor cells are diffusely positive for CK7 while negative for CK20,      CDX-2, TTF-1, and NKX3.1 (prostate marker).  The immunohistochemical staining      pattern is not specific and may be seen in cancers of the upper GI tract,      pancreaticobiliary system, lung (less likely due to TTF-1 negativity) and      breast.  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  Clinical and radiological correlation recommended.  Dr. Jordan      reviewed the case and agrees with the above diagnosis.   # NOV 26th, 2024- cycle #1 of gemcitabine  Abraxane    Pancreatic cancer metastasized to liver (HCC)  03/05/2023 Initial Diagnosis   Pancreatic cancer metastasized to liver (HCC)   03/12/2023 -  Chemotherapy   Patient is on Treatment Plan : PANCREATIC Abraxane  D1,8,15 + Gemcitabine  D1,8,15 q28d     03/12/2023 Cancer  Staging   Staging form: Exocrine Pancreas, AJCC 8th Edition - Clinical: Stage IV (cT4, cN2, pM1) - Signed by Rennie Cindy SAUNDERS, MD on 03/12/2023     ALLERGIES:  is allergic to other, morphine and codeine, tree extract, and wound dressing adhesive.  MEDICATIONS:  Current Outpatient Medications  Medication Sig Dispense Refill   acetaminophen  (TYLENOL ) 500 MG tablet Take 500 mg by mouth every 6 (six) hours as needed.     ALPRAZolam  (XANAX ) 0.5 MG tablet Take 1 tablet (0.5 mg total) by mouth 2 (two) times daily as needed for anxiety. 10 tablet 0   amLODipine  (NORVASC ) 10 MG tablet Take 10 mg by mouth daily.  1   apixaban  (ELIQUIS ) 5 MG TABS tablet Take 2 tablets (10 mg total) by mouth 2 (two) times daily for 5 days, THEN 1 tablet (5 mg total) 2 (two) times daily for 25 days. 70 tablet 0   atorvastatin  (LIPITOR) 10 MG tablet Take 10 mg by mouth daily.     feeding supplement (ENSURE ENLIVE / ENSURE PLUS) LIQD Take 237 mLs by mouth 2 (two) times daily between meals. 14220 mL 0   fentaNYL  (DURAGESIC ) 12 MCG/HR Place 1 patch onto the skin every 3 (three) days. 10 patch 0   HYDROmorphone  (DILAUDID ) 2 MG tablet Take 2 tablets (4 mg total) by mouth every 4 (four) hours as needed for severe pain (pain score 7-10). 180 tablet 0   HYDROmorphone  (DILAUDID ) 4 MG tablet Take 1 tablet (4 mg total) by mouth every 4 (four) hours as needed for severe pain (pain score 7-10). 90 tablet 0   insulin  aspart (NOVOLOG ) 100 UNIT/ML FlexPen Inject 5 Units into the skin 3 (three) times daily with meals. If eating and Blood Glucose (BG) 80 or higher inject 5 units for meal coverage and add correction dose per scale. If not eating, correction dose only. BG <150= 0 unit; BG 150-200= 1 unit; BG 201-250= 2 unit; BG 251-300= 3 unit; BG 301-350= 4 unit; BG 351-400= 5 unit; BG >400= 6 unit and Call Primary care. 15 mL 0   JARDIANCE  25 MG TABS tablet Take 25 mg by mouth daily.     lidocaine -prilocaine  (EMLA ) cream Apply on the  port. 30 -45 min  prior to port access. 30 g 3   losartan  (COZAAR ) 100 MG tablet Take 100 mg by mouth daily.  1   metoprolol  tartrate (LOPRESSOR ) 50 MG tablet Take 1 tablet (50 mg total) by mouth 2 (two) times daily. 60 tablet 1   mometasone -formoterol  (DULERA ) 100-5 MCG/ACT AERO Inhale 2 puffs into the lungs 2 (two) times daily as needed for wheezing or shortness of breath.     naloxone  (NARCAN ) nasal spray 4 mg/0.1 mL One spray in nostril for unresponsiveness 2 each 0   OLANZapine  (ZYPREXA ) 5 MG tablet Take 1 tablet (5 mg total) by mouth at bedtime. 30 tablet 3   Omega-3 Fatty Acids (FISH OIL) 1000 MG CAPS Take 1,000 mg by mouth daily.     ondansetron  (ZOFRAN )  8 MG tablet One pill every 8 hours as needed for nausea/vomitting. (Patient not taking: Reported on 04/01/2023) 40 tablet 1   prochlorperazine  (COMPAZINE ) 10 MG tablet Take 1 tablet (10 mg total) by mouth every 6 (six) hours as needed for nausea or vomiting. (Patient not taking: Reported on 04/01/2023) 40 tablet 1   RYBELSUS 7 MG TABS Take 1 tablet by mouth daily.     No current facility-administered medications for this visit.   Facility-Administered Medications Ordered in Other Visits  Medication Dose Route Frequency Provider Last Rate Last Admin   sodium chloride  flush (NS) 0.9 % injection 10 mL  10 mL Intravenous Once Dorthie Santini, Fonda SAUNDERS, NP        VITAL SIGNS: Ht 5' 10 (1.778 m)   BMI 22.33 kg/m  There were no vitals filed for this visit.   Estimated body mass index is 22.33 kg/m as calculated from the following:   Height as of this encounter: 5' 10 (1.778 m).   Weight as of 04/11/23: 155 lb 9.6 oz (70.6 kg).  LABS: CBC:    Component Value Date/Time   WBC 17.4 (H) 04/15/2023 1442   WBC 9.1 04/05/2023 0442   HGB 8.7 (L) 04/15/2023 1442   HCT 28.8 (L) 04/15/2023 1442   PLT 291 04/15/2023 1442   MCV 83.7 04/15/2023 1442   NEUTROABS 16.7 (H) 04/15/2023 1442   LYMPHSABS 0.1 (L) 04/15/2023 1442   MONOABS 0.3 04/15/2023  1442   EOSABS 0.0 04/15/2023 1442   BASOSABS 0.1 04/15/2023 1442   Comprehensive Metabolic Panel:    Component Value Date/Time   NA 141 04/15/2023 1442   K 4.6 04/15/2023 1442   CL 109 04/15/2023 1442   CO2 22 04/15/2023 1442   BUN 28 (H) 04/15/2023 1442   CREATININE 0.70 04/15/2023 1442   GLUCOSE 136 (H) 04/15/2023 1442   CALCIUM  10.4 (H) 04/15/2023 1442   AST 38 04/15/2023 1442   ALT 28 04/15/2023 1442   ALKPHOS 713 (H) 04/15/2023 1442   BILITOT 1.0 04/15/2023 1442   PROT 6.2 (L) 04/15/2023 1442   ALBUMIN  1.9 (L) 04/15/2023 1442    RADIOGRAPHIC STUDIES: DG Swallowing Func-Speech Pathology Result Date: 04/04/2023 Table formatting from the original result was not included. Modified Barium Swallow Study Patient Details Name: CLEDITH ABDOU MRN: [id number] Date of Birth: March 06, 1961 Today's Date: 04/04/2023 HPI/PMH: HPI: Pt is a 63 y.o. male with medical history significant for Metastatic pancreatic Cancer on Palliative Chemotherapy, asthma, prostate Cancer, type 2 diabetes mellitus, hypertension, dyslipidemia, and PE on Eliquis , who presented to the emergency room with acute onset of dyspnea with associated cough productive of yellow sputum without wheezing.  Today patient was found to have hemoglobin 6.5.  He agreed for blood transfusion however during transfusion he developed worsening shortness of breath with tachycardia.  Transfusion was aborted and blood return to blood bank for transfusion reaction workup.  Patient has also been seen by cardiologist and awaiting repeat echo before decision is made on Large pericardial effusion, lung metastasis per MD note.  Per Oncology note: Patient was hospitalized November 2024 with shortness of breath/acute PE, bilateral DVT.  CT of the abdomen and pelvis found to have a pancreatic head mass and liver masses.  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  Patient is status post 2 cycles of gemcitabine  and  Abraxane .  Now admitted to the hospital with pneumonia.  Palliative Care was consulted for GOC.   Chest CT: Large pericardial effusion increased compared  to 02/27/2023.  Clinical correlation recommended to exclude tamponade physiology.  3. Increased bilateral centrilobular micro nodules and tree-in-bud  opacities with more confluence opacities in the right lung, likely  due to bronchopneumonia from aspiration. Lymphangitic spread of  tumor could appear similarly.  4. Irregular pleural thickening or loculated pleural effusion in the  lower right chest has increased compared to 02/27/2023. There is  associated pleural nodularity in the posterior lower lobe. This may be due to bronchopneumonia though metastases are not excluded. Clinical Impression: Clinical Impression: Pt presents with profound oropharyngeal dysphagia that is likely related to overall deconditioned state. As such he is at a very high risk of aspiration, malnutrition and dehydration when consuming POs. Specifically, when consuming thin liquids via spoon, nectar thick liquids via spoon and small cup sips as well as small boluses of puree, he presents with sensed and silent aspiration of all consistencies. When aspiration is sensed, he produced a delayed weak non-productive cough that was not effective in clearing aspirates or laryngeal penetrates. As such, would recommend NPO and discussion regarding GOC. Recommend extensive oral care to reduce the bacterial load of pt's salvia as his risk of aspirating his own salvia is high.     Analysis of swallow function:  Oral phase is c/b decrease propulsion of boluses over base of tongue  Pharyngeal phase is c/b decreased base of tongue strength, weak hyoid movement, decreased epiglottic deflection, delayed swallow initiation to leave of pyriform sinuses and weak pharyngeal parastasis resulting in reduced UES opening and passage of boluses as well s gross widespread pharyngeal residue throughout pharynx. Boluses  were observed to be resting within the laryngeal vestibule and on pt's vocal cords with no ability to clear these penetrates even with a cued cough. Aspiration was gross when consuming thin liquids via spoon d/t delayed pharyngeal swallow. While less of the nectar and puree were aspirated, pharyngeal residue was greater resulting in continued build-up of penetrates. Factors that may increase risk of adverse event in presence of aspiration Noe & Lianne 2021): Factors that may increase risk of adverse event in presence of aspiration Noe & Lianne 2021): Poor general health and/or compromised immunity; Reduced cognitive function; Limited mobility; Frail or deconditioned; Dependence for feeding and/or oral hygiene; Inadequate oral hygiene; Reduced saliva; Weak cough; Frequent aspiration of large volumes Recommendations/Plan: Swallowing Evaluation Recommendations Swallowing Evaluation Recommendations Recommendations: NPO Medication Administration: Via alternative means Oral care recommendations: Oral care QID (4x/day) Recommended consults: Consider Palliative care Caregiver Recommendations: -- (TBD) Treatment Plan Treatment Plan Treatment recommendations: No treatment recommended at this time Follow-up recommendations: No SLP follow up Recommendations Comment: Palliative Care f/u for GOC Recommendations Recommendations for follow up therapy are one component of a multi-disciplinary discharge planning process, led by the attending physician.  Recommendations may be updated based on patient status, additional functional criteria and insurance authorization. Assessment: Orofacial Exam: Orofacial Exam Oral Cavity: Oral Hygiene: Xerostomia Oral Cavity - Dentition: Adequate natural dentition Orofacial Anatomy: WFL Oral Motor/Sensory Function: WFL Anatomy: Anatomy: WFL Boluses Administered: Boluses Administered Boluses Administered: Thin liquids (Level 0); Mildly thick liquids (Level 2, nectar thick); Puree  Oral  Impairment Domain: Oral Impairment Domain Lip Closure: No labial escape Tongue control during bolus hold: Not tested Bolus preparation/mastication: Slow prolonged chewing/mashing with complete recollection Bolus transport/lingual motion: Delayed initiation of tongue motion (oral holding); Slow tongue motion Oral residue: Residue collection on oral structures Location of oral residue : Floor of mouth; Tongue Initiation of pharyngeal swallow : Pyriform sinuses  Pharyngeal Impairment Domain:  Pharyngeal Impairment Domain Soft palate elevation: No bolus between soft palate (SP)/pharyngeal wall (PW) Laryngeal elevation: Partial superior movement of thyroid cartilage/partial approximation of arytenoids to epiglottic petiole Anterior hyoid excursion: Partial anterior movement; No anterior movement Epiglottic movement: Partial inversion; No inversion Laryngeal vestibule closure: None, wide column air/contrast in laryngeal vestibule; Incomplete, narrow column air/contrast in laryngeal vestibule Pharyngeal stripping wave : Present - diminished Pharyngoesophageal segment opening: Minimal distention/minimal duration, marked obstruction of flow; Partial distention/partial duration, partial obstruction of flow Tongue base retraction: Narrow column of contrast or air between tongue base and PPW; Wide column of contrast or air between tongue base and PPW Pharyngeal residue: Majority of contrast within or on pharyngeal structures Location of pharyngeal residue: Tongue base; Valleculae; Pharyngeal wall; Pyriform sinuses  Esophageal Impairment Domain: Esophageal Impairment Domain Esophageal clearance upright position: Complete clearance, esophageal coating Pill: No data recorded Penetration/Aspiration Scale Score: Penetration/Aspiration Scale Score 7.  Material enters airway, passes BELOW cords and not ejected out despite cough attempt by patient: Thin liquids (Level 0); Mildly thick liquids (Level 2, nectar thick); Puree 8.  Material  enters airway, passes BELOW cords without attempt by patient to eject out (silent aspiration) : Thin liquids (Level 0); Mildly thick liquids (Level 2, nectar thick); Puree Compensatory Strategies: No data recorded  General Information: Caregiver present: No  Diet Prior to this Study: Dysphagia 3 (mechanical soft); Thin liquids (Level 0)   Temperature : Normal   Respiratory Status: WFL   Supplemental O2: None (Room air)   History of Recent Intubation: No  Behavior/Cognition: Alert; Cooperative; Distractible; Requires cueing Self-Feeding Abilities: Needs assist with self-feeding Baseline vocal quality/speech: Normal Volitional Cough: Able to elicit Volitional Swallow: Able to elicit Exam Limitations: No limitations Goal Planning: Prognosis for improved oropharyngeal function: -- (Poor) Barriers to Reach Goals: Cognitive deficits; Time post onset; Severity of deficits; Overall medical prognosis Barriers/Prognosis Comment: Frail; Drowsy; Pain/pain meds; Cancer w/ Metastasis Patient/Family Stated Goal: none stated Consulted and agree with results and recommendations: Physician; Nurse Pain: Pain Assessment Pain Assessment: Faces Faces Pain Scale: 2 Pain Location: R shoulder; BLEs Pain Descriptors / Indicators: Aching Pain Intervention(s): Monitored during session; Premedicated before session End of Session: Start Time:SLP Start Time (ACUTE ONLY): 1430 Stop Time: SLP Stop Time (ACUTE ONLY): 1450 Time Calculation:SLP Time Calculation (min) (ACUTE ONLY): 20 min Charges: SLP Evaluations $ SLP Speech Visit: 1 Visit SLP Evaluations $BSS Swallow: 1 Procedure $MBS Swallow: 1 Procedure $Self Care/Home Management: 8-22 SLP visit diagnosis: SLP Visit Diagnosis: Dysphagia, oropharyngeal phase (R13.12) Past Medical History: Past Medical History: Diagnosis Date  Asthma   Cancer (HCC)   prostate  Diabetes mellitus without complication (HCC)   type II  Hyperlipidemia   Hypertension   Sleep apnea   uses Cpap machine  Past Surgical  History: Past Surgical History: Procedure Laterality Date  CHOLECYSTECTOMY    COLONOSCOPY WITH PROPOFOL  N/A 11/18/2018  Procedure: COLONOSCOPY WITH PROPOFOL ;  Surgeon: Janalyn Keene NOVAK, MD;  Location: [facility] ENDOSCOPY;  Service: Gastroenterology;  Laterality: N/A;  COLONOSCOPY WITH PROPOFOL  N/A 02/05/2023  Procedure: COLONOSCOPY WITH PROPOFOL ;  Surgeon: Unk Corinn Skiff, MD;  Location: [facility] ENDOSCOPY;  Service: Gastroenterology;  Laterality: N/A;  IR IMAGING GUIDED PORT INSERTION  03/24/2023  IR US  LIVER BIOPSY  03/03/2023  LAPAROSCOPIC RETROPUBIC PROSTATECTOMY    2023  LUNG SURGERY    ligation of thoracic duct  PROSTATE BIOPSY N/A 09/21/2020  Procedure: PROSTATE BIOPSY GRAYCE;  Surgeon: Kassie Ozell SAUNDERS, MD;  Location: [facility] ORS;  Service: Urology;  Laterality: N/A; Happi  Overton 04/04/2023, 3:25 PM  US  Venous Img Lower Bilateral (DVT) Result Date: 04/02/2023 CLINICAL DATA:  BILATERAL lower extremity pain times months. [id number] Lower extremity pain [id number] EXAM: BILATERAL LOWER EXTREMITY VENOUS DOPPLER ULTRASOUND TECHNIQUE: Gray-scale sonography with graded compression, as well as color Doppler and duplex ultrasound were performed to evaluate the lower extremity deep venous systems from the level of the common femoral vein and including the common femoral, femoral, profunda femoral, popliteal and calf veins including the posterior tibial, peroneal and gastrocnemius veins when visible. The superficial great saphenous vein was also interrogated. Spectral Doppler was utilized to evaluate flow at rest and with distal augmentation maneuvers in the common femoral, femoral and popliteal veins. COMPARISON:  Lower extremity venous duplex, most recently 03/25/2023, POSITIVE for BILATERAL lower extremity DVT of the RIGHT femoral and LEFT popliteal veins. FINDINGS: RIGHT LOWER EXTREMITY VENOUS Normal compressibility of the RIGHT common femoral and visualized portions of profunda femoral vein and great saphenous veins.  Similar appearance of near-occlusive filling defect involving the RIGHT femoral, popliteal and visualized portions of the calf veins OTHER No evidence of superficial thrombophlebitis or abnormal fluid collection. Limitations: none LEFT LOWER EXTREMITY VENOUS Normal compressibility of the LEFT common femoral, superficial femoral, profunda femoral vein and great saphenous veins. Similar appearance of near-occlusive filling defect within the LEFT popliteal and visualized portions of the calf veins. OTHER No evidence of superficial thrombophlebitis or abnormal fluid collection. Limitations: none IMPRESSION: Since lower extremity venous duplex dated 03/25/2023; 1. No new or worsening DVT within either lower extremity. 2. Similar appearance of BILATERAL lower extremity DVT, extending from the RIGHT femoral through the calf veins and LEFT popliteal through the calf veins Thom Hall, MD [facility] Electronically Signed   By: Thom Hall M.D.   On: 04/02/2023 13:38   ECHOCARDIOGRAM COMPLETE Result Date: 04/01/2023    ECHOCARDIOGRAM REPORT   Patient Name:   MERLIN GOLDEN Date of Exam: 04/01/2023 Medical Rec #:  [id number]     Height:       70.0 in Accession #:    [id number]    Weight:       149.0 lb Date of Birth:  1960-05-21    BSA:          1.842 m Patient Age:    62 years      BP:           109/52 mmHg Patient Gender: M             HR:           100 bpm. Exam Location:  [facility] Procedure: 2D Echo, Cardiac Doppler and Color Doppler Indications:     Pericardial Effusion I31.3  History:         Patient has prior history of Echocardiogram examinations, most                  recent 03/04/2023. Risk Factors:Diabetes, Hypertension and                  Sleep Apnea.  Sonographer:     Christopher Furnace Referring Phys:  [id number] JAN A MANSY Diagnosing Phys: Lonni Hanson MD IMPRESSIONS  1. Left ventricular ejection fraction, by estimation, is 60 to 65%. The left ventricle has  normal function. The left ventricle has no regional wall motion abnormalities. There is moderate left ventricular hypertrophy. Left ventricular diastolic parameters are consistent with Grade II diastolic dysfunction (pseudonormalization).  2. Right ventricular systolic function is normal.  The right ventricular size is normal. There is mildly elevated pulmonary artery systolic pressure.  3. There is equivocal early diastolic collapse of the right ventricle. However, mitral and tricuspid valve inflow velocity respiratory variation is not consistent with tamponade physiology.. Large pericardial effusion. The pericardial effusion is circumferential.  4. The mitral valve is normal in structure. Trivial mitral valve regurgitation. No evidence of mitral stenosis.  5. The aortic valve is tricuspid. Aortic valve regurgitation is not visualized. No aortic stenosis is present.  6. The inferior vena cava is normal in size with <50% respiratory variability, suggesting right atrial pressure of 8 mmHg. Comparison(s): A prior study was performed on 03/04/2023. The pericardial effusion appears somewhat larger. FINDINGS  Left Ventricle: Left ventricular ejection fraction, by estimation, is 60 to 65%. The left ventricle has normal function. The left ventricle has no regional wall motion abnormalities. The left ventricular internal cavity size was normal in size. There is  moderate left ventricular hypertrophy. Left ventricular diastolic parameters are consistent with Grade II diastolic dysfunction (pseudonormalization). Right Ventricle: The right ventricular size is normal. No increase in right ventricular wall thickness. Right ventricular systolic function is normal. There is mildly elevated pulmonary artery systolic pressure. The tricuspid regurgitant velocity is 3.00  m/s, and with an assumed right atrial pressure of 8 mmHg, the estimated right ventricular systolic pressure is 44.0 mmHg. Left Atrium: Left atrial size was normal in  size. Right Atrium: Right atrial size was normal in size. Pericardium: There is equivocal early diastolic collapse of the right ventricle. However, mitral and tricuspid valve inflow velocity respiratory variation is not consistent with tamponade physiology. A large pericardial effusion is present. The pericardial effusion is circumferential. Mitral Valve: The mitral valve is normal in structure. Trivial mitral valve regurgitation. No evidence of mitral valve stenosis. MV peak gradient, 4.8 mmHg. The mean mitral valve gradient is 3.0 mmHg. Tricuspid Valve: The tricuspid valve is normal in structure. Tricuspid valve regurgitation is mild. Aortic Valve: The aortic valve is tricuspid. Aortic valve regurgitation is not visualized. No aortic stenosis is present. Aortic valve mean gradient measures 4.0 mmHg. Aortic valve peak gradient measures 6.0 mmHg. Aortic valve area, by VTI measures 2.91 cm. Pulmonic Valve: The pulmonic valve was grossly normal. Pulmonic valve regurgitation is trivial. No evidence of pulmonic stenosis. Aorta: The aortic root is normal in size and structure. Pulmonary Artery: The pulmonary artery is not well seen. Venous: The inferior vena cava is normal in size with less than 50% respiratory variability, suggesting right atrial pressure of 8 mmHg. IAS/Shunts: No atrial level shunt detected by color flow Doppler.  LEFT VENTRICLE PLAX 2D LVIDd:         4.10 cm   Diastology LVIDs:         1.80 cm   LV e' medial:    6.42 cm/s LV PW:         1.50 cm   LV E/e' medial:  13.8 LV IVS:        1.20 cm   LV e' lateral:   15.00 cm/s LVOT diam:     2.10 cm   LV E/e' lateral: 5.9 LV SV:         58 LV SV Index:   32 LVOT Area:     3.46 cm  RIGHT VENTRICLE RV Basal diam:  2.90 cm RV Mid diam:    2.50 cm RV S prime:     20.90 cm/s TAPSE (M-mode): 1.8 cm LEFT ATRIUM  Index        RIGHT ATRIUM          Index LA diam:      2.40 cm 1.30 cm/m   RA Area:     9.46 cm LA Vol (A4C): 20.3 ml 11.02 ml/m  RA Volume:    14.30 ml 7.76 ml/m  AORTIC VALVE AV Area (Vmax):    2.70 cm AV Area (Vmean):   2.44 cm AV Area (VTI):     2.91 cm AV Vmax:           122.00 cm/s AV Vmean:          90.900 cm/s AV VTI:            0.200 m AV Peak Grad:      6.0 mmHg AV Mean Grad:      4.0 mmHg LVOT Vmax:         95.00 cm/s LVOT Vmean:        64.000 cm/s LVOT VTI:          0.168 m LVOT/AV VTI ratio: 0.84  AORTA Ao Root diam: 3.05 cm MITRAL VALVE               TRICUSPID VALVE MV Area (PHT): 4.57 cm    TR Peak grad:   36.0 mmHg MV Area VTI:   2.32 cm    TR Vmax:        300.00 cm/s MV Peak grad:  4.8 mmHg MV Mean grad:  3.0 mmHg    SHUNTS MV Vmax:       1.09 m/s    Systemic VTI:  0.17 m MV Vmean:      80.1 cm/s   Systemic Diam: 2.10 cm MV Decel Time: 166 msec MV E velocity: 88.60 cm/s MV A velocity: 78.10 cm/s MV E/A ratio:  1.13 Lonni End MD Electronically signed by Lonni Hanson MD Signature Date/Time: 04/01/2023/2:19:37 PM    Final    CT Angio Chest PE W/Cm &/Or Wo Cm Result Date: 04/01/2023 CLINICAL DATA:  Cough and fever for 3 days. On chemo for pancreatic cancer. Elevated heart rate. PE suspected. EXAM: CT ANGIOGRAPHY CHEST WITH CONTRAST TECHNIQUE: Multidetector CT imaging of the chest was performed using the standard protocol during bolus administration of intravenous contrast. Multiplanar CT image reconstructions and MIPs were obtained to evaluate the vascular anatomy. RADIATION DOSE REDUCTION: This exam was performed according to the departmental dose-optimization program which includes automated exposure control, adjustment of the mA and/or kV according to patient size and/or use of iterative reconstruction technique. CONTRAST:  75mL OMNIPAQUE  IOHEXOL  350 MG/ML SOLN COMPARISON:  Same day chest radiograph and CTA chest 04/28/2022 FINDINGS: Cardiovascular: Right IJ CVC tip at the superior cavoatrial junction. Negative for acute pulmonary embolism. Large pericardial effusion increased compared to 02/27/2023. Clinical correlation  recommended to exclude tamponade physiology. Mild aortic atherosclerotic calcification. Mediastinum/Nodes: Debris layering within the posterior trachea. Esophagus is unremarkable. Surgical clips adjacent to the lower esophagus. Mediastinal and hilar adenopathy is not well evaluated. Lungs/Pleura: Diffuse bronchial wall thickening scattered mucous plugging greatest in the right lower lobe. Bilateral centrilobular micro nodules and tree-in-bud opacities with more confluence opacities in the right lung. Irregular pleural thickening or loculated pleural effusion in the lower right chest has increased compared to 02/27/2023. There is associated pleural nodularity in the posterior lower lobe. For example 14 x 9 mm nodule on series 5/image 97. Pleural calcifications in the posterior right lung base. No pneumothorax. Upper Abdomen: Incompletely evaluated large metastasis  in the right hepatic lobe appears slightly increased compared to 02/27/2023, measuring up to 14.5 cm previously 13.3 cm. Partially visualized dilated main pancreatic duct. Pancreatic head is not included in the exam. Small amount of perisplenic ascites. Musculoskeletal: No acute fracture or destructive osseous lesion. Review of the MIP images confirms the above findings. IMPRESSION: 1. Negative for acute pulmonary embolism. 2. Large pericardial effusion increased compared to 02/27/2023. Clinical correlation recommended to exclude tamponade physiology. 3. Increased bilateral centrilobular micro nodules and tree-in-bud opacities with more confluence opacities in the right lung, likely due to bronchopneumonia from aspiration. Lymphangitic spread of tumor could appear similarly. 4. Irregular pleural thickening or loculated pleural effusion in the lower right chest has increased compared to 02/27/2023. There is associated pleural nodularity in the posterior lower lobe. This may be due to bronchopneumonia though metastases are not excluded. 5. Decreased size of  the large hepatic metastasis. Aortic Atherosclerosis (ICD10-I70.0). Electronically Signed   By: Norman Gatlin M.D.   On: 04/01/2023 00:53   DG Chest 2 View Result Date: 03/31/2023 CLINICAL DATA:  Cough and fever EXAM: CHEST - 2 VIEW COMPARISON:  Chest x-ray 03/05/2023.  Chest CT 02/27/2023. FINDINGS: There is a new right chest port with distal catheter tip projecting over the distal SVC. The cardiac silhouette appears mildly enlarged, unchanged. Small right pleural effusion persists. Patchy opacities in the right lung base have decreased, but have not completely resolved. There is a new focal nodular density in the left mid lung measuring 8 mm. The left lung is otherwise clear. There is no pneumothorax or acute fracture. IMPRESSION: 1. Small right pleural effusion persists. 2. Patchy opacities in the right lung base have decreased, but have not completely resolved. 3. New 8 mm nodular density in the left mid lung. Recommend follow-up chest CT. Electronically Signed   By: Greig Pique M.D.   On: 03/31/2023 21:10   NM Bone Scan Whole Body Result Date: 03/30/2023 CLINICAL DATA:  Metastatic pancreatic cancer. Evaluate for bone disease. EXAM: NUCLEAR MEDICINE WHOLE BODY BONE SCAN TECHNIQUE: Whole body anterior and posterior images were obtained approximately 3 hours after intravenous injection of radiopharmaceutical. RADIOPHARMACEUTICALS:  21.78 mCi Technetium-33m MDP IV COMPARISON:  CTA chest 02/27/2023 and abdominal MRI 02/28/2023 FINDINGS: No abnormal uptake involving the axial or appendicular skeleton. Expected uptake in the renal collecting systems and urinary bladder. IMPRESSION: Negative bone scan.  No evidence for metastatic bone disease. Electronically Signed   By: Juliene Balder M.D.   On: 03/30/2023 21:01   US  Venous Img Lower Bilateral Result Date: 03/25/2023 CLINICAL DATA:  Follow-up lower extremity DVT. History of prostate cancer. Patient is on anticoagulation. EXAM: BILATERAL LOWER EXTREMITY  VENOUS DOPPLER ULTRASOUND TECHNIQUE: Gray-scale sonography with graded compression, as well as color Doppler and duplex ultrasound were performed to evaluate the lower extremity deep venous systems from the level of the common femoral vein and including the common femoral, femoral, profunda femoral, popliteal and calf veins including the posterior tibial, peroneal and gastrocnemius veins when visible. The superficial great saphenous vein was also interrogated. Spectral Doppler was utilized to evaluate flow at rest and with distal augmentation maneuvers in the common femoral, femoral and popliteal veins. COMPARISON:  Bilateral lower extremity venous Doppler ultrasound-02/26/2023 (positive for DVT extending from right femoral vein through right tibial veins as well as the left popliteal vein through the left tibial veins) FINDINGS: RIGHT LOWER EXTREMITY Common Femoral Vein: No evidence of thrombus. Normal compressibility, respiratory phasicity and response to augmentation. Saphenofemoral Junction: No evidence  of thrombus. Normal compressibility and flow on color Doppler imaging. Profunda Femoral Vein: No evidence of thrombus. Normal compressibility and flow on color Doppler imaging. Femoral Vein: There is predominantly occlusive DVT involving the proximal (image 41), mid (image 44) and distal (image 47) aspects of the right femoral vein, grossly unchanged compared to the 02/26/2023 examination. Popliteal Vein: Redemonstrated near occlusive DVT involving the right popliteal vein (images 47 and 50), similar to the 02/26/2023 examination. Calf Veins: Redemonstrated occlusive DVT involving both paired divisions of the left posterior tibial (image 56) as well as both paired divisions of the right peroneal veins (image 58). Superficial Great Saphenous Vein: No evidence of thrombus. Normal compressibility. Other Findings:  None. _________________________________________________________ LEFT LOWER EXTREMITY Common Femoral  Vein: No evidence of thrombus. Normal compressibility, respiratory phasicity and response to augmentation. Saphenofemoral Junction: No evidence of thrombus. Normal compressibility and flow on color Doppler imaging. Profunda Femoral Vein: No evidence of thrombus. Normal compressibility and flow on color Doppler imaging. Femoral Vein: No evidence of thrombus. Normal compressibility, respiratory phasicity and response to augmentation. Popliteal Vein: Redemonstrated occlusive DVT involving the left popliteal vein (images 21 and 25), unchanged compared to the 02/26/2023 examination. Calf Veins: Redemonstrated occlusive DVT involving both paired divisions of the left posterior tibial vein (images 27 and 28), unchanged compared to the 02/26/2023 examination. The left peroneal veins appear patent where imaged (image 30), improved compared to the 02/26/2023 examination. Superficial Great Saphenous Vein: No evidence of thrombus. Normal compressibility. Other Findings:  None. IMPRESSION: 1. Redemonstrated bilateral lower extremity DVT extending from the right femoral vein through the right calf veins, grossly unchanged compared to the 02/26/2023 examination. 2. Resolved left peroneal DVT. 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 Electronically Signed   By: Norleen Roulette M.D.   On: 03/25/2023 12:10   IR IMAGING GUIDED PORT INSERTION Result Date: 03/24/2023 CLINICAL DATA:  Metastatic pancreas CANCER TO THE LIVER EXAM: RIGHT INTERNAL JUGULAR SINGLE LUMEN POWER PORT CATHETER INSERTION Date:  03/24/2023 03/24/2023 11:55 am Radiologist:  M. Frederic Specking, MD Guidance:  Ultrasound and fluoroscopic MEDICATIONS: 1% lidocaine  local with epinephrine  ANESTHESIA/SEDATION: Versed  0.5 mg IV; Fentanyl  25 mcg IV; Moderate Sedation Time:  30 minutes The patient was continuously monitored during the procedure by the  interventional radiology nurse under my direct supervision. FLUOROSCOPY: 0 minutes, 71 seconds (3.4 mGy) COMPLICATIONS: None immediate. CONTRAST:  None. PROCEDURE: Informed consent was obtained from the patient following explanation of the procedure, risks, benefits and alternatives. The patient understands, agrees and consents for the procedure. All questions were addressed. A time out was performed. Maximal barrier sterile technique utilized including caps, mask, sterile gowns, sterile gloves, large sterile drape, hand hygiene, and 2% chlorhexidine  scrub. Under sterile conditions and local anesthesia, right internal jugular micropuncture venous access was performed. Access was performed with ultrasound. Images were obtained for documentation of the patent right internal jugular vein. A guide wire was inserted followed by a transitional dilator. This allowed insertion of a guide wire and catheter into the IVC. Measurements were obtained from the SVC / RA junction back to the right IJ venotomy site. In the right infraclavicular chest, a subcutaneous pocket was created over the second anterior rib. This was done under sterile conditions and local anesthesia. 1% lidocaine  with epinephrine  was utilized for this. A 2.5 cm incision was made in the skin. Blunt dissection was performed to create a subcutaneous pocket over  the right pectoralis major muscle. The pocket was flushed with saline vigorously. There was adequate hemostasis. The port catheter was assembled and checked for leakage. The port catheter was secured in the pocket with two retention sutures. The tubing was tunneled subcutaneously to the right venotomy site and inserted into the SVC/RA junction through a valved peel-away sheath. Position was confirmed with fluoroscopy. Images were obtained for documentation. The patient tolerated the procedure well. No immediate complications. Incisions were closed in a two layer fashion with 4 - 0 Vicryl suture. Dermabond  was applied to the skin. The port catheter was accessed, blood was aspirated followed by saline and heparin  flushes. Needle was removed. A dry sterile dressing was applied. IMPRESSION: Ultrasound and fluoroscopically guided right internal jugular single lumen power port catheter insertion. Tip in the SVC/RA junction. Catheter ready for use. Electronically Signed   By: CHRISTELLA.  Shick M.D.   On: 03/24/2023 12:17    PERFORMANCE STATUS (ECOG) : 2 - Symptomatic, <50% confined to bed  Review of Systems Unless otherwise noted, a complete review of systems is negative.  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  Long conversation with patient, wife, and daughter.  Patient is overall declining as a result of his cancer.  However, he remains adamant that he wishes to pursue any and all treatment options available.  Daughter, who is an [employer], asked about TPN versus PEG.  We discussed both in detail.  I feel that TPN is contraindicated in light of his neutropenia and increased risk of infections.  Similarly, I feel that patient would be at high risk for any interventional procedures at this point.  I explained that patient would likely get minimal, if any, benefit from artificial nutrition.  However, family would like to speak with a general surgeon to further explore option of PEG.  Neutropenia -secondary to chemotherapy.  Discussed with Dr. Jacobo and will start patient empirically on daily Levaquin .  Weakness -wife requests  referral to home health.  Also discussed option of hospice if patient decides to forego further chemotherapy.  However, at this point he wishes to proceed with future treatments.  Hypercalcemia -calcium  improved with IV fluids earlier this week.  Will proceed with repeat fluids today and tomorrow.  ACP -we also had a lengthy conversation regarding CODE STATUS.  I again explained  the almost certain futility associated with resuscitative efforts in the setting of terminal cancer.  Patient has remained adamant that he wanted to remain a full code.  Wife agreed that patient and family will discuss in more detail.  Return to clinic tomorrow for fluid/supportive care.  Patient see Dr. Rennie next week.  Patient expressed understanding and was in agreement with this plan. He also understands that He can call clinic at any time with any questions, concerns, or complaints.   Thank you for allowing me to participate in the care of this very pleasant patient.   Time Total: 45 minutes  Visit consisted of counseling and education dealing with the complex and emotionally intense issues of symptom management in the setting of serious illness.Greater than 50%  of this time was spent counseling and  coordinating care related to the above assessment and plan.  Signed by: Fonda Mower, PhD, NP-C

## 2023-04-17 NOTE — Telephone Encounter (Signed)
 RN spoke with Sharia Reeve, NP. Josh requested pt to come in for apt today with chest xray, port labs, smc/ possible fluids. Need to discuss goals of care.- see phone note

## 2023-04-17 NOTE — Progress Notes (Signed)
 C/o trouble swallowing, not eating, coughing, SOB ongoing since Nov 2024. Has been without meds for 2 days.  C/o neck, rt shoulder pain 8/10.  C/o sore in buttock area, lesion on left elbow.  Lt>Rt leg swelling/oozing, asking for referral to wound care.  Asking for referral for Home health care. Ins will pay if referral is placed.

## 2023-04-18 ENCOUNTER — Other Ambulatory Visit: Payer: Self-pay

## 2023-04-18 ENCOUNTER — Inpatient Hospital Stay
Admission: EM | Admit: 2023-04-18 | Discharge: 2023-04-22 | DRG: 871 | Disposition: A | Discharge status: Hospice | Payer: BC Managed Care – PPO | Attending: Internal Medicine | Admitting: Internal Medicine

## 2023-04-18 ENCOUNTER — Other Ambulatory Visit: Payer: Self-pay | Admitting: Internal Medicine

## 2023-04-18 ENCOUNTER — Inpatient Hospital Stay: Payer: BC Managed Care – PPO

## 2023-04-18 ENCOUNTER — Emergency Department: Payer: BC Managed Care – PPO

## 2023-04-18 DIAGNOSIS — E870 Hyperosmolality and hypernatremia: Secondary | ICD-10-CM | POA: Diagnosis not present

## 2023-04-18 DIAGNOSIS — Z885 Allergy status to narcotic agent status: Secondary | ICD-10-CM

## 2023-04-18 DIAGNOSIS — Z1152 Encounter for screening for COVID-19: Secondary | ICD-10-CM

## 2023-04-18 DIAGNOSIS — E1165 Type 2 diabetes mellitus with hyperglycemia: Secondary | ICD-10-CM | POA: Diagnosis present

## 2023-04-18 DIAGNOSIS — Z86718 Personal history of other venous thrombosis and embolism: Secondary | ICD-10-CM

## 2023-04-18 DIAGNOSIS — E785 Hyperlipidemia, unspecified: Secondary | ICD-10-CM | POA: Diagnosis present

## 2023-04-18 DIAGNOSIS — C799 Secondary malignant neoplasm of unspecified site: Secondary | ICD-10-CM

## 2023-04-18 DIAGNOSIS — Z91048 Other nonmedicinal substance allergy status: Secondary | ICD-10-CM

## 2023-04-18 DIAGNOSIS — Z8546 Personal history of malignant neoplasm of prostate: Secondary | ICD-10-CM

## 2023-04-18 DIAGNOSIS — I3139 Other pericardial effusion (noninflammatory): Secondary | ICD-10-CM | POA: Diagnosis not present

## 2023-04-18 DIAGNOSIS — L89152 Pressure ulcer of sacral region, stage 2: Secondary | ICD-10-CM | POA: Diagnosis present

## 2023-04-18 DIAGNOSIS — A419 Sepsis, unspecified organism: Secondary | ICD-10-CM | POA: Diagnosis present

## 2023-04-18 DIAGNOSIS — G9341 Metabolic encephalopathy: Secondary | ICD-10-CM | POA: Diagnosis present

## 2023-04-18 DIAGNOSIS — R64 Cachexia: Secondary | ICD-10-CM | POA: Diagnosis present

## 2023-04-18 DIAGNOSIS — I10 Essential (primary) hypertension: Secondary | ICD-10-CM | POA: Diagnosis present

## 2023-04-18 DIAGNOSIS — Z66 Do not resuscitate: Secondary | ICD-10-CM | POA: Diagnosis present

## 2023-04-18 DIAGNOSIS — Z515 Encounter for palliative care: Secondary | ICD-10-CM | POA: Diagnosis not present

## 2023-04-18 DIAGNOSIS — J449 Chronic obstructive pulmonary disease, unspecified: Secondary | ICD-10-CM | POA: Diagnosis present

## 2023-04-18 DIAGNOSIS — Z91018 Allergy to other foods: Secondary | ICD-10-CM

## 2023-04-18 DIAGNOSIS — J9601 Acute respiratory failure with hypoxia: Secondary | ICD-10-CM | POA: Diagnosis present

## 2023-04-18 DIAGNOSIS — J690 Pneumonitis due to inhalation of food and vomit: Secondary | ICD-10-CM | POA: Insufficient documentation

## 2023-04-18 DIAGNOSIS — G4733 Obstructive sleep apnea (adult) (pediatric): Secondary | ICD-10-CM | POA: Diagnosis present

## 2023-04-18 DIAGNOSIS — Z79899 Other long term (current) drug therapy: Secondary | ICD-10-CM

## 2023-04-18 DIAGNOSIS — Z95828 Presence of other vascular implants and grafts: Secondary | ICD-10-CM | POA: Diagnosis not present

## 2023-04-18 DIAGNOSIS — D702 Other drug-induced agranulocytosis: Secondary | ICD-10-CM

## 2023-04-18 DIAGNOSIS — Z7901 Long term (current) use of anticoagulants: Secondary | ICD-10-CM

## 2023-04-18 DIAGNOSIS — D709 Neutropenia, unspecified: Secondary | ICD-10-CM | POA: Diagnosis present

## 2023-04-18 DIAGNOSIS — T451X5A Adverse effect of antineoplastic and immunosuppressive drugs, initial encounter: Secondary | ICD-10-CM | POA: Diagnosis present

## 2023-04-18 DIAGNOSIS — Z7951 Long term (current) use of inhaled steroids: Secondary | ICD-10-CM

## 2023-04-18 DIAGNOSIS — C787 Secondary malignant neoplasm of liver and intrahepatic bile duct: Secondary | ICD-10-CM | POA: Diagnosis present

## 2023-04-18 DIAGNOSIS — Z794 Long term (current) use of insulin: Secondary | ICD-10-CM

## 2023-04-18 DIAGNOSIS — Z91010 Allergy to peanuts: Secondary | ICD-10-CM

## 2023-04-18 DIAGNOSIS — E43 Unspecified severe protein-calorie malnutrition: Secondary | ICD-10-CM | POA: Diagnosis present

## 2023-04-18 DIAGNOSIS — Z8701 Personal history of pneumonia (recurrent): Secondary | ICD-10-CM

## 2023-04-18 DIAGNOSIS — E876 Hypokalemia: Secondary | ICD-10-CM | POA: Diagnosis not present

## 2023-04-18 DIAGNOSIS — R339 Retention of urine, unspecified: Secondary | ICD-10-CM | POA: Diagnosis present

## 2023-04-18 DIAGNOSIS — R1314 Dysphagia, pharyngoesophageal phase: Secondary | ICD-10-CM | POA: Diagnosis present

## 2023-04-18 DIAGNOSIS — Z7984 Long term (current) use of oral hypoglycemic drugs: Secondary | ICD-10-CM

## 2023-04-18 DIAGNOSIS — C259 Malignant neoplasm of pancreas, unspecified: Secondary | ICD-10-CM | POA: Diagnosis present

## 2023-04-18 DIAGNOSIS — I3131 Malignant pericardial effusion in diseases classified elsewhere: Secondary | ICD-10-CM | POA: Diagnosis present

## 2023-04-18 DIAGNOSIS — J189 Pneumonia, unspecified organism: Secondary | ICD-10-CM

## 2023-04-18 DIAGNOSIS — D630 Anemia in neoplastic disease: Secondary | ICD-10-CM | POA: Diagnosis present

## 2023-04-18 DIAGNOSIS — Z6820 Body mass index (BMI) 20.0-20.9, adult: Secondary | ICD-10-CM

## 2023-04-18 DIAGNOSIS — C250 Malignant neoplasm of head of pancreas: Secondary | ICD-10-CM | POA: Diagnosis not present

## 2023-04-18 DIAGNOSIS — Z86711 Personal history of pulmonary embolism: Secondary | ICD-10-CM

## 2023-04-18 DIAGNOSIS — D6481 Anemia due to antineoplastic chemotherapy: Secondary | ICD-10-CM | POA: Diagnosis present

## 2023-04-18 LAB — CBC WITH DIFFERENTIAL/PLATELET
Abs Immature Granulocytes: 0.07 10*3/uL (ref 0.00–0.07)
Basophils Absolute: 0 10*3/uL (ref 0.0–0.1)
Basophils Relative: 3 %
Eosinophils Absolute: 0 10*3/uL (ref 0.0–0.5)
Eosinophils Relative: 0 %
HCT: 29 % — ABNORMAL LOW (ref 39.0–52.0)
Hemoglobin: 8.5 g/dL — ABNORMAL LOW (ref 13.0–17.0)
Immature Granulocytes: 11 %
Lymphocytes Relative: 8 %
Lymphs Abs: 0.1 10*3/uL — ABNORMAL LOW (ref 0.7–4.0)
MCH: 25.1 pg — ABNORMAL LOW (ref 26.0–34.0)
MCHC: 29.3 g/dL — ABNORMAL LOW (ref 30.0–36.0)
MCV: 85.5 fL (ref 80.0–100.0)
Monocytes Absolute: 0.1 10*3/uL (ref 0.1–1.0)
Monocytes Relative: 18 %
Neutro Abs: 0.4 10*3/uL — CL (ref 1.7–7.7)
Neutrophils Relative %: 60 %
Platelets: 131 10*3/uL — ABNORMAL LOW (ref 150–400)
RBC: 3.39 MIL/uL — ABNORMAL LOW (ref 4.22–5.81)
RDW: 18 % — ABNORMAL HIGH (ref 11.5–15.5)
Smear Review: NORMAL
WBC: 0.6 10*3/uL — CL (ref 4.0–10.5)
nRBC: 0 % (ref 0.0–0.2)

## 2023-04-18 LAB — CMP (CANCER CENTER ONLY)
ALT: 21 U/L (ref 0–44)
AST: 25 U/L (ref 15–41)
Albumin: 1.8 g/dL — ABNORMAL LOW (ref 3.5–5.0)
Alkaline Phosphatase: 664 U/L — ABNORMAL HIGH (ref 38–126)
Anion gap: 9 (ref 5–15)
BUN: 33 mg/dL — ABNORMAL HIGH (ref 8–23)
CO2: 20 mmol/L — ABNORMAL LOW (ref 22–32)
Calcium: 10.3 mg/dL (ref 8.9–10.3)
Chloride: 115 mmol/L — ABNORMAL HIGH (ref 98–111)
Creatinine: 0.89 mg/dL (ref 0.61–1.24)
GFR, Estimated: >60 mL/min (ref >60)
Glucose, Bld: 251 mg/dL — ABNORMAL HIGH (ref 70–99)
Potassium: 3.8 mmol/L (ref 3.5–5.1)
Sodium: 144 mmol/L (ref 135–145)
Total Bilirubin: 1.1 mg/dL (ref 0.0–1.2)
Total Protein: 5.9 g/dL — ABNORMAL LOW (ref 6.5–8.1)

## 2023-04-18 LAB — COMPREHENSIVE METABOLIC PANEL
ALT: 24 U/L (ref 0–44)
AST: 36 U/L (ref 15–41)
Albumin: 1.8 g/dL — ABNORMAL LOW (ref 3.5–5.0)
Alkaline Phosphatase: 765 U/L — ABNORMAL HIGH (ref 38–126)
Anion gap: 12 (ref 5–15)
BUN: 32 mg/dL — ABNORMAL HIGH (ref 8–23)
CO2: 17 mmol/L — ABNORMAL LOW (ref 22–32)
Calcium: 10.2 mg/dL (ref 8.9–10.3)
Chloride: 115 mmol/L — ABNORMAL HIGH (ref 98–111)
Creatinine, Ser: 0.8 mg/dL (ref 0.61–1.24)
GFR, Estimated: >60 mL/min (ref >60)
Glucose, Bld: 226 mg/dL — ABNORMAL HIGH (ref 70–99)
Potassium: 3.6 mmol/L (ref 3.5–5.1)
Sodium: 144 mmol/L (ref 135–145)
Total Bilirubin: 1.2 mg/dL (ref 0.0–1.2)
Total Protein: 6.1 g/dL — ABNORMAL LOW (ref 6.5–8.1)

## 2023-04-18 LAB — MRSA NEXT GEN BY PCR, NASAL: MRSA by PCR Next Gen: NOT DETECTED

## 2023-04-18 LAB — RESP PANEL BY RT-PCR (RSV, FLU A&B, COVID)  RVPGX2
Influenza A by PCR: NEGATIVE
Influenza B by PCR: NEGATIVE
Resp Syncytial Virus by PCR: NEGATIVE
SARS Coronavirus 2 by RT PCR: NEGATIVE

## 2023-04-18 LAB — GLUCOSE, CAPILLARY
Glucose-Capillary: 150 mg/dL — ABNORMAL HIGH (ref 70–99)
Glucose-Capillary: 190 mg/dL — ABNORMAL HIGH (ref 70–99)
Glucose-Capillary: 177 mg/dL — ABNORMAL HIGH (ref 70–99)

## 2023-04-18 LAB — APTT: aPTT: 59 s — ABNORMAL HIGH (ref 24–36)

## 2023-04-18 LAB — PROTIME-INR
INR: 2.1 — ABNORMAL HIGH (ref 0.8–1.2)
Prothrombin Time: 23.4 s — ABNORMAL HIGH (ref 11.4–15.2)

## 2023-04-18 LAB — LACTIC ACID, PLASMA: Lactic Acid, Venous: 1.8 mmol/L (ref 0.5–1.9)

## 2023-04-18 MED ORDER — SODIUM CHLORIDE 0.9 % IV SOLN
500.0000 mg | Freq: Once | INTRAVENOUS | Status: AC
  Administered 2023-04-18: 500 mg via INTRAVENOUS
  Filled 2023-04-18: qty 5

## 2023-04-18 MED ORDER — FENTANYL 12 MCG/HR TD PT72
1.0000 | MEDICATED_PATCH | TRANSDERMAL | Status: DC
Start: 2023-04-18 — End: 2023-04-22
  Administered 2023-04-18 – 2023-04-21 (×2): 1 via TRANSDERMAL
  Filled 2023-04-18 (×2): qty 1

## 2023-04-18 MED ORDER — ACETAMINOPHEN 650 MG RE SUPP
650.0000 mg | RECTAL | Status: DC | PRN
  Administered 2023-04-18: 650 mg via RECTAL
  Filled 2023-04-18: qty 1

## 2023-04-18 MED ORDER — SODIUM CHLORIDE 0.9 % IV SOLN
2.0000 g | Freq: Three times a day (TID) | INTRAVENOUS | Status: DC
  Administered 2023-04-19 – 2023-04-22 (×11): 2 g via INTRAVENOUS
  Filled 2023-04-18 (×12): qty 12.5

## 2023-04-18 MED ORDER — MOMETASONE FURO-FORMOTEROL FUM 100-5 MCG/ACT IN AERO
2.0000 | INHALATION_SPRAY | Freq: Two times a day (BID) | RESPIRATORY_TRACT | Status: DC
  Administered 2023-04-18 – 2023-04-22 (×8): 2 via RESPIRATORY_TRACT
  Filled 2023-04-18: qty 8.8

## 2023-04-18 MED ORDER — INSULIN ASPART 100 UNIT/ML IJ SOLN: 0.0000 [IU] | Freq: Every day | INTRAMUSCULAR | Status: DC

## 2023-04-18 MED ORDER — IOHEXOL 350 MG/ML SOLN
75.0000 mL | Freq: Once | INTRAVENOUS | Status: AC | PRN
  Administered 2023-04-18: 75 mL via INTRAVENOUS

## 2023-04-18 MED ORDER — HYDROMORPHONE HCL 1 MG/ML PO LIQD: 2.0000 mg | ORAL | 0 refills | Status: DC | PRN

## 2023-04-18 MED ORDER — METOPROLOL TARTRATE 5 MG/5ML IV SOLN
5.0000 mg | INTRAVENOUS | Status: DC | PRN
  Administered 2023-04-18 – 2023-04-20 (×3): 5 mg via INTRAVENOUS
  Filled 2023-04-18 (×3): qty 5

## 2023-04-18 MED ORDER — ONDANSETRON HCL 4 MG/2ML IJ SOLN
4.0000 mg | Freq: Four times a day (QID) | INTRAMUSCULAR | Status: DC | PRN
  Administered 2023-04-20: 4 mg via INTRAVENOUS
  Filled 2023-04-18: qty 2

## 2023-04-18 MED ORDER — APIXABAN 5 MG PO TABS: 5.0000 mg | ORAL_TABLET | Freq: Two times a day (BID) | ORAL | Status: DC

## 2023-04-18 MED ORDER — ORAL CARE MOUTH RINSE
15.0000 mL | OROMUCOSAL | Status: DC
  Administered 2023-04-18 – 2023-04-22 (×15): 15 mL via OROMUCOSAL

## 2023-04-18 MED ORDER — ORAL CARE MOUTH RINSE
15.0000 mL | OROMUCOSAL | Status: DC | PRN
Start: 2023-04-18 — End: 2023-04-22

## 2023-04-18 MED ORDER — METRONIDAZOLE 500 MG/100ML IV SOLN
500.0000 mg | Freq: Two times a day (BID) | INTRAVENOUS | Status: DC
  Administered 2023-04-18 – 2023-04-22 (×8): 500 mg via INTRAVENOUS
  Filled 2023-04-18 (×10): qty 100

## 2023-04-18 MED ORDER — ACETAMINOPHEN 500 MG PO TABS
500.0000 mg | ORAL_TABLET | Freq: Four times a day (QID) | ORAL | Status: DC | PRN
  Filled 2023-04-18: qty 1

## 2023-04-18 MED ORDER — LACTATED RINGERS IV BOLUS
1000.0000 mL | Freq: Once | INTRAVENOUS | Status: AC
  Administered 2023-04-18: 1000 mL via INTRAVENOUS

## 2023-04-18 MED ORDER — ALBUMIN HUMAN 25 % IV SOLN
25.0000 g | Freq: Four times a day (QID) | INTRAVENOUS | Status: AC
  Administered 2023-04-18: 12.5 g via INTRAVENOUS
  Administered 2023-04-18: 25 g via INTRAVENOUS
  Administered 2023-04-19: 12.5 g via INTRAVENOUS
  Administered 2023-04-19: 25 g via INTRAVENOUS
  Filled 2023-04-18 (×4): qty 100

## 2023-04-18 MED ORDER — OLANZAPINE 5 MG PO TABS
5.0000 mg | ORAL_TABLET | Freq: Every day | ORAL | Status: DC
  Filled 2023-04-18 (×2): qty 1

## 2023-04-18 MED ORDER — ALPRAZOLAM 0.5 MG PO TABS
0.5000 mg | ORAL_TABLET | Freq: Two times a day (BID) | ORAL | Status: DC | PRN
  Administered 2023-04-21 – 2023-04-22 (×3): 0.5 mg via ORAL
  Filled 2023-04-18 (×3): qty 1

## 2023-04-18 MED ORDER — HYDROMORPHONE HCL 1 MG/ML IJ SOLN
0.5000 mg | Freq: Once | INTRAMUSCULAR | Status: AC
  Administered 2023-04-18: 0.5 mg via INTRAVENOUS
  Filled 2023-04-18: qty 0.5

## 2023-04-18 MED ORDER — INSULIN ASPART 100 UNIT/ML IJ SOLN
0.0000 [IU] | Freq: Three times a day (TID) | INTRAMUSCULAR | Status: DC
  Administered 2023-04-19: 2 [IU] via SUBCUTANEOUS
  Administered 2023-04-20 (×2): 3 [IU] via SUBCUTANEOUS
  Administered 2023-04-20: 2 [IU] via SUBCUTANEOUS
  Administered 2023-04-21 (×2): 3 [IU] via SUBCUTANEOUS
  Administered 2023-04-21: 5 [IU] via SUBCUTANEOUS
  Administered 2023-04-22 (×2): 8 [IU] via SUBCUTANEOUS
  Filled 2023-04-18 (×9): qty 1

## 2023-04-18 MED ORDER — CEFEPIME HCL 2 G IV SOLR
2.0000 g | Freq: Once | INTRAVENOUS | Status: AC
  Administered 2023-04-18: 2 g via INTRAVENOUS
  Filled 2023-04-18: qty 12.5

## 2023-04-18 MED ORDER — VANCOMYCIN HCL IN DEXTROSE 1-5 GM/200ML-% IV SOLN
1000.0000 mg | Freq: Once | INTRAVENOUS | Status: AC
  Administered 2023-04-18: 1000 mg via INTRAVENOUS
  Filled 2023-04-18: qty 200

## 2023-04-18 MED ORDER — HYDROMORPHONE HCL 1 MG/ML IJ SOLN
0.5000 mg | INTRAMUSCULAR | Status: DC | PRN
  Administered 2023-04-18 – 2023-04-22 (×17): 0.5 mg via INTRAVENOUS
  Filled 2023-04-18 (×18): qty 1

## 2023-04-18 MED ORDER — DEXTROSE 50 % IV SOLN
INTRAVENOUS | Status: AC
  Administered 2023-04-18: 50 mL
  Filled 2023-04-18: qty 50

## 2023-04-18 MED ORDER — SODIUM CHLORIDE 0.9% FLUSH
10.0000 mL | Freq: Once | INTRAVENOUS | Status: AC
  Administered 2023-04-18: 10 mL via INTRAVENOUS
  Filled 2023-04-18: qty 10

## 2023-04-18 NOTE — ED Notes (Signed)
 This RN to bedside to introduce self to pt. Pt family at bedside. Pt is alert and at baseline. Pt readjusted in bed.

## 2023-04-18 NOTE — Sepsis Progress Note (Signed)
 Sepsis protocol monitored by [employer] ?

## 2023-04-18 NOTE — ED Provider Notes (Signed)
 [facility] Provider Note    Event Date/Time   First MD Initiated Contact with Patient 04/18/23 1107     (approximate)   History   Shortness of Breath   HPI Tony Benson is a 63 y.o. 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  Family notes that shortness of breath progressed in the last 12 hours into this morning.  Intermittent cough with sputum production.  Worsening fatigue.  Ongoing dysphagia symptoms with concern for aspiration.  Otherwise denies fever, chest pain, vomiting, diarrhea, constipation, dysuria.  Reportedly missed 1 dose of Eliquis  recently.  Chart review: Reviewed most recent admission with discharge on 04/05/2023 for large pericardial effusion with pleural effusion, lung metastases, and community-acquired pneumonia.     Physical Exam   Triage Vital Signs: ED Triage Vitals  Encounter Vitals Group     BP 04/18/23 1054 (!) 104/56     Systolic BP Percentile --      Diastolic BP Percentile --      Pulse Rate 04/18/23 1054 (!) 121     Resp 04/18/23 1054 (!) 23     Temp 04/18/23 1117 97.8 F (36.6 C)     Temp Source 04/18/23 1117 Oral     SpO2 04/18/23 1054 100 %     Weight --      Height --      Head Circumference --      Peak Flow --      Pain Score 04/18/23 1055 10     Pain Loc --      Pain Education --      Exclude from Growth Chart --     Most recent vital signs: Vitals:   04/18/23 1200 04/18/23 1400  BP: 104/74 112/66  Pulse: 100 (!) 106  Resp: 19 (!) 21  Temp:    SpO2: 98% 98%   Physical Exam: I have reviewed the vital signs and nursing notes. General: Awake, alert, no acute distress.  Chronically ill-appearing Head:  Atraumatic, normocephalic.   ENT:  EOM intact, PERRL. Oral mucosa is pink and moist with no lesions. Neck: Neck is supple with full range of  motion, No meningeal signs. Cardiovascular: Tachycardia, RR, No murmurs. Peripheral pulses palpable and equal bilaterally. Respiratory:  Symmetrical chest wall expansion.  Diminished air movement in the right base.  No use of accessory muscles.   Musculoskeletal:  No cyanosis or edema. Moving extremities with full ROM Abdomen:  Soft, nontender, nondistended. Neuro:  GCS 15, moving all four extremities, interacting appropriately. Speech clear. Psych:  Calm, appropriate.   Skin:  Warm, dry, no rash.    ED Results / Procedures / Treatments   Labs (all labs ordered are listed, but only abnormal results are displayed) Labs Reviewed  COMPREHENSIVE METABOLIC PANEL - Abnormal; Notable for the following components:      Result Value   Chloride 115 (*)    CO2 17 (*)    Glucose, Bld 226 (*)    BUN 32 (*)    Total Protein 6.1 (*)    Albumin  1.8 (*)    Alkaline Phosphatase 765 (*)    All other components within normal limits  CBC WITH DIFFERENTIAL/PLATELET - Abnormal; Notable for the following components:   WBC 0.6 (*)    RBC 3.39 (*)    Hemoglobin 8.5 (*)    HCT 29.0 (*)  MCH 25.1 (*)    MCHC 29.3 (*)    RDW 18.0 (*)    Platelets 131 (*)    Neutro Abs 0.4 (*)    Lymphs Abs 0.1 (*)    All other components within normal limits  PROTIME-INR - Abnormal; Notable for the following components:   Prothrombin Time 23.4 (*)    INR 2.1 (*)    All other components within normal limits  APTT - Abnormal; Notable for the following components:   aPTT 59 (*)    All other components within normal limits  RESP PANEL BY RT-PCR (RSV, FLU A&B, COVID)  RVPGX2  CULTURE, BLOOD (ROUTINE X 2)  CULTURE, BLOOD (ROUTINE X 2)  LACTIC ACID, PLASMA  URINALYSIS, W/ REFLEX TO CULTURE (INFECTION SUSPECTED)     EKG My EKG interpretation: Rate of 120, sinus tachycardia with occasional PAC.  Normal axis, normal intervals.  No acute ST elevation or depression   RADIOLOGY Independently interpreted CTA chest  showing evidence of worsening right-sided pneumonia as well as evidence of lymphangitic carcinomatosis   PROCEDURES:  Critical Care performed: Yes, see critical care procedure note(s)  .Critical Care  Performed by: Malvina Alm DASEN, MD Authorized by: Malvina Alm DASEN, MD   Critical care provider statement:    Critical care time (minutes):  35   Critical care was time spent personally by me on the following activities:  Development of treatment plan with patient or surrogate, discussions with consultants, evaluation of patient's response to treatment, examination of patient, ordering and review of laboratory studies, ordering and review of radiographic studies, ordering and performing treatments and interventions, pulse oximetry, re-evaluation of patient's condition and review of old charts   I assumed direction of critical care for this patient from another provider in my specialty: no     Care discussed with: admitting provider      MEDICATIONS ORDERED IN ED: Medications  ceFEPIme  (MAXIPIME ) 2 g in sodium chloride  0.9 % 100 mL IVPB (has no administration in time range)  vancomycin  (VANCOCIN ) IVPB 1000 mg/200 mL premix (has no administration in time range)  azithromycin  (ZITHROMAX ) 500 mg in sodium chloride  0.9 % 250 mL IVPB (has no administration in time range)  lactated ringers  bolus 1,000 mL (0 mLs Intravenous Stopped 04/18/23 1230)  HYDROmorphone  (DILAUDID ) injection 0.5 mg (0.5 mg Intravenous Given 04/18/23 1140)  iohexol  (OMNIPAQUE ) 350 MG/ML injection 75 mL (75 mLs Intravenous Contrast Given 04/18/23 1213)     IMPRESSION / MDM / ASSESSMENT AND PLAN / ED COURSE  I reviewed the triage vital signs and the nursing notes.                              Differential diagnosis includes, but is not limited to, pneumonia, worsening pleural effusion, worsening pericardial effusion, COVID, flu, RSV  Patient's presentation is most consistent with acute presentation with potential threat to life  or bodily function.  Patient is a 63 year old male with metastatic cancer presenting today for shortness of breath and fatigue.  Notably tachycardic and tachypneic on arrival.  No hypoxia on room air.  Afebrile.  Laboratory workup notable for profound leukopenia at 0.6 with 400 neutrophil absolute count.  CMP notable for hypocarbia as well as elevated BUN likely indicative of dehydration.  Negative viral swabs.  CTA chest shows worsening right-sided infiltrates with recent history of aspiration pneumonia.  No evidence of PE.  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  Patient was given 1  L of fluids and was still tachycardic and tachypneic afterwards.  Will plan to admit and started on broad-spectrum antibiotics as patient meets sepsis.  Admitted to hospitalist for further care.  The patient is on the cardiac monitor to evaluate for evidence of arrhythmia and/or significant heart rate changes.     FINAL CLINICAL IMPRESSION(S) / ED DIAGNOSES   Final diagnoses:  Sepsis, due to unspecified organism, unspecified whether acute organ dysfunction present [facility]  Pneumonia of right middle lobe due to infectious organism  Metastatic malignant neoplasm, unspecified site [facility])     Rx / DC Orders   ED Discharge Orders     None        Note:  This document was prepared using Dragon voice recognition software and may include unintentional dictation errors.   Malvina Alm DASEN, MD 04/18/23 [phone]

## 2023-04-18 NOTE — Progress Notes (Signed)
 Per v/o Josh, NP-patient needs to go to ER as patient appears to be deconditioning while in clinic. Per NP- concerns for aspiration pneumonia.  Pt escorted to ER via w/c by Rosaline, RN. Port-a-cath saline locked with biopatch in place. Josh spoke with pt's wife and patient who is agreeable to ER transfer.

## 2023-04-18 NOTE — Progress Notes (Signed)
 per wife, she called 911 for pt early this am. pt experience dyspnea. EKG and vitals taken and patient was placed on oxygen . inhaler used. bp today 105/60/ HR 116 ; 100% RA. temp 98. cxr report still not on chart. pt requesting iv dilaudid . last dilaudid  taken last evening. Pt c/o dyspnea.

## 2023-04-18 NOTE — Consult Note (Addendum)
 Pharmacy Antibiotic Note  Tony Benson is a 63 y.o. male admitted on 04/18/2023 with  HCAP . They presented with SOB over the last  12 hours with worsening fatigue. Recently admitted 12/21 for large pericardial effusion with pleural effusion, lung mets and CAP. Neutropenic. Current chemo therapy regimen. Paclitaxel -protein bound and Gemcitabine . Afebrile on admission. Pharmacy has been consulted for Cefepime  dosing.  Today, 04/18/2023 D1 Scr 0.8 (at baseline)  WBC 0.6 ANC 0.4 Afebrile/24 hours CTA chest with worsening right-sided infiltrates with recent hx of aspiration PNA  Plan: Start Cefepime  2g Q8H  Patient is scheduled to receive 1 x dose vancomycin , zithromax  Also on Flagyl  500 mg IV BID Monitor renal function and follow fever curve Follow culture data for opportunities to de-escalate and optimize therapy   Temp (24hrs), Avg:98.1 F (36.7 C), Min:97.8 F (36.6 C), Max:98.4 F (36.9 C)  Recent Labs  Lab 04/15/23 1442 04/17/23 1315 04/18/23 0926 04/18/23 1135  WBC 17.4* 0.6*  --  0.6*  CREATININE 0.70 0.75 0.89 0.80  LATICACIDVEN  --   --   --  1.8    Estimated Creatinine Clearance: 90.1 mL/min (by C-G formula based on SCr of 0.8 mg/dL).    Allergies  Allergen Reactions   Other Anaphylaxis    peanuts   Morphine And Codeine Itching   Tree Extract Other (See Comments)    ALMONDS  ---- EYE/ FACIAL SWELLING   Wound Dressing Adhesive Rash   Antimicrobials this admission: Cefepime  2g Q8H >>  Vancomycin  scheduled x 1 Flagyl  500 mg BID >> Zithromax  scheduled x 1  Dose adjustments this admission: NA  Microbiology results: 1/3 BCx: In process   Thank you for allowing pharmacy to be a part of this patient's care.  Alfonso MARLA Buys, PharmD Pharmacy Resident  04/18/2023 4:14 PM

## 2023-04-18 NOTE — Addendum Note (Signed)
 Addended by: Malachy Moan on: 04/18/2023 09:16 AM   Modules accepted: Orders

## 2023-04-18 NOTE — Progress Notes (Signed)
 SLP Cancellation Note  Patient Details Name: TYKWON FERA MRN: [id number] DOB: [birth year]   Cancelled treatment:       Reason Eval/Treat Not Completed: Other (comment)  Pt is known to ST service from 04/04/2023. Pt with Modified Barium Swallow Study and recommendation of NPO, extensive education provided to pt and his daughter at bedside with video playback. Pt's daughter voiced understanding of results, high aspiration risk and poor prognosis for dysphagia improvement.   Further pt met with Fonda Mower with this information discussed and pt's continued high risk of aspiration own salvia.   Pt continues to decline. ST services will sign off at this time as PO intake would continue placing pt at profound risk of aspiration.   Kevon Tench B. Rubbie, M.S., CCC-SLP, CBIS Speech-Language Pathologist Certified Brain Injury Specialist [facility]  [facility] [phone] Ascom [phone] Fax [phone]  Claudetta Rubbie 04/18/2023, 4:33 PM

## 2023-04-18 NOTE — ED Notes (Signed)
 Family is asking for transfer to [facility]. This RN asked why. Wife states For a second opinion, This RN spoke to her and the daughter who is a engineer, civil (consulting) at [employer] on why we dont typically transfer patients unless medically necessary. But I will message MD and advised.

## 2023-04-18 NOTE — H&P (Addendum)
 History and Physical    SUPREME RYBARCZYK FMW:[id number] DOB: Mar 10, 1961 DOA: 04/18/2023  PCP: Weyman Bright, MD (Confirm with patient/family/NH records and if not entered, this has to be entered at [facility] point of entry) Patient coming from: Home  I have personally briefly reviewed patient's old medical records in [facility]  Chief Complaint: Cough, shortness of breath, weakness  HPI: [name] is a 63 y.o. male with medical history significant of metastatic pancreatic cancer on palliative chemotherapy, asthma, prostate cancer, IDDM, HTN, HLD, PE on Eliquis , presented with worsening of cough and shortness of breath.  Patient has had multiple aspiration pneumonia since November last year.  He has been following with speech therapy who did a barium swallow recently showed patient has silent aspiration with increased aspiration pneumonia risk.  Wife at bedside reported that the patient has constant cough and choking after eating or drinking does not matter solid or liquid.  No fever chills no chest pains.  ED Course: Tachycardia and tachypneic not hypoxic.  CT chest showed worsening of right lower lobe pneumonia likely aspiration.  Blood work showed WBC 0.6 neutrophil 400, glucose 251, hemoglobin 8.5, creatinine 0.8, K3.8, BUN 33.  Patient was given ceftriaxone  and azithromycin  in the ED.  Review of Systems: As per HPI otherwise 14 point review of systems negative.    Past Medical History:  Diagnosis Date   Asthma    Cancer (HCC)    prostate   Diabetes mellitus without complication (HCC)    type II   Hyperlipidemia    Hypertension    Sleep apnea    uses Cpap machine     Past Surgical History:  Procedure Laterality Date   CHOLECYSTECTOMY     COLONOSCOPY WITH PROPOFOL  N/A 11/18/2018   Procedure: COLONOSCOPY WITH PROPOFOL ;  Surgeon: Janalyn Keene NOVAK, MD;  Location: [facility] ENDOSCOPY;  Service: Gastroenterology;  Laterality: N/A;   COLONOSCOPY WITH PROPOFOL  N/A 02/05/2023    Procedure: COLONOSCOPY WITH PROPOFOL ;  Surgeon: Unk Corinn Skiff, MD;  Location: [facility] ENDOSCOPY;  Service: Gastroenterology;  Laterality: N/A;   IR IMAGING GUIDED PORT INSERTION  03/24/2023   IR US  LIVER BIOPSY  03/03/2023   LAPAROSCOPIC RETROPUBIC PROSTATECTOMY     2023   LUNG SURGERY     ligation of thoracic duct   PROSTATE BIOPSY N/A 09/21/2020   Procedure: PROSTATE BIOPSY GRAYCE;  Surgeon: Kassie Ozell SAUNDERS, MD;  Location: [facility] ORS;  Service: Urology;  Laterality: N/A;     reports that he has never smoked. He has never used smokeless tobacco. He reports that he does not drink alcohol and does not use drugs.  Allergies  Allergen Reactions   Other Anaphylaxis    peanuts   Morphine And Codeine Itching   Tree Extract Other (See Comments)    ALMONDS  ---- EYE/ FACIAL SWELLING   Wound Dressing Adhesive Rash    Family History  Problem Relation Age of Onset   Allergies Sister      Prior to Admission medications   Medication Sig Start Date End Date Taking? Authorizing Provider  acetaminophen  (TYLENOL ) 500 MG tablet Take 500 mg by mouth every 6 (six) hours as needed.    [provider]  ALPRAZolam  (XANAX ) 0.5 MG tablet Take 1 tablet (0.5 mg total) by mouth 2 (two) times daily as needed for anxiety. 03/07/23   Josette Ade, MD  amLODipine  (NORVASC ) 10 MG tablet Take 10 mg by mouth daily. 09/10/17   [provider]  apixaban  (ELIQUIS ) 5 MG  TABS tablet Take 2 tablets (10 mg total) by mouth 2 (two) times daily for 5 days, THEN 1 tablet (5 mg total) 2 (two) times daily for 25 days. 03/07/23 04/11/23  Josette Ade, MD  atorvastatin  (LIPITOR) 10 MG tablet Take 10 mg by mouth daily.    [provider]  feeding supplement (ENSURE ENLIVE / ENSURE PLUS) LIQD Take 237 mLs by mouth 2 (two) times daily between meals. 03/07/23   Josette Ade, MD  fentaNYL  (DURAGESIC ) 12 MCG/HR Place 1 patch onto the skin every 3 (three) days. 03/20/23   Dasie Tinnie MATSU, NP   HYDROmorphone  HCl (DILAUDID ) 1 MG/ML LIQD Take 2-4 mLs (2-4 mg total) by mouth every 4 (four) hours as needed for severe pain (pain score 7-10). 04/18/23   Borders, Fonda SAUNDERS, NP  insulin  aspart (NOVOLOG ) 100 UNIT/ML FlexPen Inject 5 Units into the skin 3 (three) times daily with meals. If eating and Blood Glucose (BG) 80 or higher inject 5 units for meal coverage and add correction dose per scale. If not eating, correction dose only. BG <150= 0 unit; BG 150-200= 1 unit; BG 201-250= 2 unit; BG 251-300= 3 unit; BG 301-350= 4 unit; BG 351-400= 5 unit; BG >400= 6 unit and Call Primary care. 03/07/23   Josette Ade, MD  JARDIANCE  25 MG TABS tablet Take 25 mg by mouth daily. 12/23/22   [provider]  levofloxacin  (LEVAQUIN ) 25 MG/ML solution Take 20 mLs (500 mg total) by mouth daily. 04/17/23   Borders, Fonda SAUNDERS, NP  lidocaine -prilocaine  (EMLA ) cream Apply on the port. 30 -45 min  prior to port access. 03/12/23   Brahmanday, Govinda R, MD  losartan  (COZAAR ) 100 MG tablet Take 100 mg by mouth daily. 08/15/17   [provider]  metoprolol  tartrate (LOPRESSOR ) 50 MG tablet Take 1 tablet (50 mg total) by mouth 2 (two) times daily. 03/31/23   Brahmanday, Govinda R, MD  mometasone -formoterol  (DULERA ) 100-5 MCG/ACT AERO Inhale 2 puffs into the lungs 2 (two) times daily as needed for wheezing or shortness of breath.    [provider]  naloxone  (NARCAN ) nasal spray 4 mg/0.1 mL One spray in nostril for unresponsiveness 03/07/23   Josette Ade, MD  OLANZapine  (ZYPREXA ) 5 MG tablet TAKE 1 TABLET BY MOUTH EVERYDAY AT BEDTIME 04/18/23   Borders, Joshua R, NP  Omega-3 Fatty Acids (FISH OIL) 1000 MG CAPS Take 1,000 mg by mouth daily.    [provider]  ondansetron  (ZOFRAN ) 8 MG tablet One pill every 8 hours as needed for nausea/vomitting. Patient not taking: Reported on 04/01/2023 03/12/23   Brahmanday, Govinda R, MD  prochlorperazine  (COMPAZINE ) 10 MG tablet Take 1 tablet (10 mg  total) by mouth every 6 (six) hours as needed for nausea or vomiting. Patient not taking: Reported on 04/01/2023 03/12/23   Rennie Cindy SAUNDERS, MD  RYBELSUS 7 MG TABS Take 1 tablet by mouth daily. 03/19/23   [provider]    Physical Exam: Vitals:   04/18/23 1200 04/18/23 1400 04/18/23 1600 04/18/23 1605  BP: 104/74 112/66 136/71   Pulse: 100 (!) 106 (!) 128   Resp: 19 (!) 21 (!) 32   Temp:    98.4 F (36.9 C)  TempSrc:    Oral  SpO2: 98% 98% 94%     Constitutional: NAD, calm, comfortable Vitals:   04/18/23 1200 04/18/23 1400 04/18/23 1600 04/18/23 1605  BP: 104/74 112/66 136/71   Pulse: 100 (!) 106 (!) 128   Resp: 19 (!) 21 (!)  32   Temp:    98.4 F (36.9 C)  TempSrc:    Oral  SpO2: 98% 98% 94%    Eyes: PERRL, lids and conjunctivae normal ENMT: Mucous membranes are moist. Posterior pharynx clear of any exudate or lesions.Normal dentition.  Neck: normal, supple, no masses, no thyromegaly Respiratory: clear to auscultation bilaterally, no wheezing, crackles on the right side, increasing respiratory effort. No accessory muscle use.  Cardiovascular: Regular rate and rhythm, no murmurs / rubs / gallops. No extremity edema. 2+ pedal pulses. No carotid bruits.  Abdomen: no tenderness, no masses palpated. No hepatosplenomegaly. Bowel sounds positive.  Musculoskeletal: no clubbing / cyanosis. No joint deformity upper and lower extremities. Good ROM, no contractures. Normal muscle tone.  Skin: no rashes, lesions, ulcers. No induration Neurologic: CN 2-12 grossly intact. Sensation intact, DTR normal. Strength 5/5 in all 4.  Psychiatric: Awake, lethargic    Labs on Admission: I have personally reviewed following labs and imaging studies  CBC: Recent Labs  Lab 04/15/23 1442 04/17/23 1315 04/18/23 1135  WBC 17.4* 0.6* 0.6*  NEUTROABS 16.7* 0.5* 0.4*  HGB 8.7* 8.6* 8.5*  HCT 28.8* 28.8* 29.0*  MCV 83.7 85.0 85.5  PLT 291 152 131*   Basic Metabolic Panel: Recent  Labs  Lab 04/15/23 1442 04/17/23 1315 04/18/23 0926 04/18/23 1135  NA 141 143 144 144  K 4.6 4.2 3.8 3.6  CL 109 112* 115* 115*  CO2 22 21* 20* 17*  GLUCOSE 136* 168* 251* 226*  BUN 28* 28* 33* 32*  CREATININE 0.70 0.75 0.89 0.80  CALCIUM  10.4* 10.2 10.3 10.2  MG 2.3  --   --   --    GFR: Estimated Creatinine Clearance: 90.1 mL/min (by C-G formula based on SCr of 0.8 mg/dL). Liver Function Tests: Recent Labs  Lab 04/15/23 1442 04/17/23 1315 04/18/23 0926 04/18/23 1135  AST 38 27 25 36  ALT 28 23 21 24   ALKPHOS 713* 794* 664* 765*  BILITOT 1.0 1.1 1.1 1.2  PROT 6.2* 6.3* 5.9* 6.1*  ALBUMIN  1.9* 1.9* 1.8* 1.8*   No results for input(s): LIPASE, AMYLASE in the last 168 hours. No results for input(s): AMMONIA in the last 168 hours. Coagulation Profile: Recent Labs  Lab 04/18/23 1135  INR 2.1*   Cardiac Enzymes: No results for input(s): CKTOTAL, CKMB, CKMBINDEX, TROPONINI in the last 168 hours. BNP (last 3 results) No results for input(s): PROBNP in the last 8760 hours. HbA1C: No results for input(s): HGBA1C in the last 72 hours. CBG: No results for input(s): GLUCAP in the last 168 hours. Lipid Profile: No results for input(s): CHOL, HDL, LDLCALC, TRIG, CHOLHDL, LDLDIRECT in the last 72 hours. Thyroid Function Tests: No results for input(s): TSH, T4TOTAL, FREET4, T3FREE, THYROIDAB in the last 72 hours. Anemia Panel: No results for input(s): VITAMINB12, FOLATE, FERRITIN, TIBC, IRON, RETICCTPCT in the last 72 hours. Urine analysis:    Component Value Date/Time   COLORURINE YELLOW (A) 04/01/2023 1853   APPEARANCEUR CLEAR (A) 04/01/2023 1853   LABSPEC 1.030 04/01/2023 1853   PHURINE 5.0 04/01/2023 1853   GLUCOSEU >=500 (A) 04/01/2023 1853   HGBUR SMALL (A) 04/01/2023 1853   BILIRUBINUR NEGATIVE 04/01/2023 1853   KETONESUR 5 (A) 04/01/2023 1853   PROTEINUR NEGATIVE 04/01/2023 1853   NITRITE NEGATIVE  04/01/2023 1853   LEUKOCYTESUR NEGATIVE 04/01/2023 1853    Radiological Exams on Admission: DG Chest 2 View Result Date: 04/18/2023 CLINICAL DATA:  Cough.  Wheezing.  Shortness of breath. EXAM: CHEST - 2  VIEW COMPARISON:  03/31/2023. FINDINGS: Redemonstration of right basilar opacity with associated small right pleural effusion without significant interval change. Bilateral lung fields are otherwise clear. Left lateral costophrenic angle is clear. Stable cardio-mediastinal silhouette. No acute osseous abnormalities. The soft tissues are within normal limits. Right-sided CT Port-A-Cath is seen with its tip overlying the lower portion of superior vena cava. IMPRESSION: *Essentially stable exam. Right basilar opacity and small right pleural effusion, without significant interval change. Electronically Signed   By: Ree Molt M.D.   On: 04/18/2023 14:21   CT Angio Chest PE W and/or Wo Contrast Result Date: 04/18/2023 CLINICAL DATA:  shortness of breath, tachycardia, hx of blood clots with missed blood thinner dose, evaluating PE vs aspiration PNA EXAM: CT ANGIOGRAPHY CHEST WITH CONTRAST TECHNIQUE: Multidetector CT imaging of the chest was performed using the standard protocol during bolus administration of intravenous contrast. Multiplanar CT image reconstructions and MIPs were obtained to evaluate the vascular anatomy. RADIATION DOSE REDUCTION: This exam was performed according to the departmental dose-optimization program which includes automated exposure control, adjustment of the mA and/or kV according to patient size and/or use of iterative reconstruction technique. CONTRAST:  75mL OMNIPAQUE  IOHEXOL  350 MG/ML SOLN COMPARISON:  CT angiography chest from 03/31/2023. FINDINGS: Cardiovascular: No evidence of embolism to the proximal subsegmental pulmonary artery level. Normal cardiac size. Redemonstration of moderate-to-large pericardial effusion without significant interval change. No aortic aneurysm. There  are coronary artery calcifications, in keeping with coronary artery disease. There are also mild peripheral atherosclerotic vascular calcifications of thoracic aorta and its major branches. Mediastinum/Nodes: Visualized thyroid gland appears grossly unremarkable. No solid / cystic mediastinal masses. The esophagus is nondistended precluding optimal assessment. No axillary, mediastinal or hilar lymphadenopathy by size criteria. Lungs/Pleura: There are retained mucus secretions along the periphery of the upper trachea. There is interval increase in the peribronchovascular opacities mainly in the right lung with middle lobe/lower lobe predominance. There also slightly irregular/nodular interlobular septal thickening in the right lung. There is also irregularity along the right major fissure and minor fissure. There are multiple ill-defined predominantly centrilobular nodules in the left lung lower lobe with interval increase when compared to the prior exam. No new dense consolidation or lung collapse. Redemonstration of small loculated right pleural effusion. No left pleural effusion. No pneumothorax. Redemonstration of pleural calcifications in the right posteroinferior hemithorax, nonspecific but commonly seen as a sequela of prior asbestos related disease. Upper Abdomen: Redemonstration of extensive, ill-defined, hypoattenuating areas in the visualized liver, compatible with metastasis. There is dilated main pancreatic duct in the body/tail region with associated atrophy of the pancreas. Remaining visualized upper abdominal viscera are within normal limits. Musculoskeletal: The visualized soft tissues of the chest wall are grossly unremarkable. No suspicious osseous lesions. There are mild multilevel degenerative changes in the visualized spine. Review of the MIP images confirms the above findings. IMPRESSION: 1. No embolism the proximal subsegmental pulmonary artery level. 2. Interval increase in the previously  seen peri-bronchovascular opacities mainly in the right lung. There is also slightly irregular/nodular interlobular septal thickening in the right lung. Small loculated right pleural effusion. 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 Correlate clinically. 3. Extensive ill-defined hypoattenuating areas in the liver, compatible with metastases. 4. Moderate-to-large pericardial effusion without significant interval change. 5. Multiple other nonacute observations, as described above. Aortic Atherosclerosis (ICD10-I70.0). Electronically Signed   By: Ree Molt M.D.   On: 04/18/2023 14:19    EKG: Independently reviewed.  Sinus tachycardia, no acute ST changes.  Assessment/Plan Principal Problem:   Sepsis (HCC) Active Problems:   Pancreatic cancer metastasized to liver [facility])   Aspiration pneumonia (HCC)  (please populate well all problems here in Problem List. (For example, if patient is on BP meds at home and you resume or decide to hold them, it is a problem that needs to be her. Same for CAD, COPD, HLD and so on)  Sepsis -Without acute endorgan damage, sepsis evidenced by tachycardia, leukopenia tachypnea, source of infection is recurrent/worsening of right-sided aspiration pneumonia. Given that the patient has large pericardial effusion and more fluid bolus will exacerbate the already large pericardial effusion, further fluid bolus will be held. -Discussed with speech therapist who just did Barium swallow outpatient last week which showed increased silent aspiration.  N.p.o. for now, until sepsis is improved. -Will use cefepime  and Flagyl  to cover both aspiration pneumonia and neutropenic sepsis. -Given the repeated aspiration pneumonia and advanced pancreatic cancer, short-term and long-term prognosis extremely poor.  Long discussion with patient wife at bedside with phone conference, made them aware about the prognosis.  Patient however declined to consider  change of goals of care or CODE STATUS.  Then I have not had discussed the case with oncology, on call NP Fonda Mower, who is familiar with patient's case, who reported that the patient has had multiple goal of care conversation since last month and similarly patient declined any offer to consider change of goals of care.  And patient decided to go on with palliative chemotherapy.  I then told the patient that given the severe neutropenia, unlikely the chemotherapy will turn well either. -Estimated life expectancy few days to few weeks, family made aware. -Discussed with ICU attending, patient will be admitted to stepdown unit for close monitoring  Neutropenia -Antibiotics coverage as above  Worsening of esophageal dysphagia Cachexia -Discussed with speech therapist who is familiar with the patient.  High risk of aspiration.  N.p.o. for now -Aspiration precaution, HOB 30 degree  Anasarca -Will use IV albumin  to hydrate patient  Moderate to large pericardial effusion -Appears to be stable compared to recent CT study. -Clinically appears that the patient symptoms are from sepsis more than pericardial effusion.  Low suspicion for tamponade as there is no significant increasing volume of pericardial effusion on CT.  Discussed with cardiology Dr. Mady, who is familiar with the patient, appears that the patient was offered with pericardiocentesis last month however patient declined -Admit to stepdown unit for close monitoring, will order a Echocardiogram -Cardiology recommends hold off Eliquis .  Sinus tachycardia -Probably multiple factorial, from sepsis, from pericardial effusion.  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 and  -As needed Lopressor  as blood pressure allows  Metastatic pancreatic cancer -Patient is full  code  IDDM -SSI  DVT prophylaxis: Lovenox  Code Status: Full code Family Communication: Wife at bedside Disposition Plan: Patient is sick with severe sepsis requiring IV antibiotics and large pericardial effusion, severe neutropenia, requiring close monitoring of clinical progress, expect more than 2 midnight hospital stay Consults called: Curbside consult with oncology, cardiology Admission status: Stepdown unit admitted   Cort ONEIDA Mana MD [facility] Pager [phone]  04/18/2023, 4:58 PM

## 2023-04-18 NOTE — ED Triage Notes (Signed)
 Pt here from the cancer center c/o SOB. Pt has been dealing with PNA since Nov and family is concerned that it has returned. Pt has a productive cough but denies fever. Pt currently has laryngitis. Pt is currently receiving treatment for prostate cancer.

## 2023-04-18 NOTE — Progress Notes (Signed)
 CODE SEPSIS - PHARMACY COMMUNICATION  **Broad Spectrum Antibiotics should be administered within 1 hour of Sepsis diagnosis**  Time Code Sepsis Called/Page Received: 1456  Antibiotics Ordered: azithromycin , cefepime , vancomycin   Time of 1st antibiotic administration: 1600  Additional action taken by pharmacy: Messaged nurse; nurse was not given pass off that she had a sepsis patient and helping a combative patient  If necessary, Name of Provider/Nurse Contacted: Camelia Eye, RN    Lum VEAR Mania ,PharmD Clinical Pharmacist  04/18/2023  3:16 PM

## 2023-04-18 NOTE — Progress Notes (Signed)
 per wife, she called 911 for pt early this am. pt experience dyspnea. EKG and vitals taken and patient was placed on oxygen . inhaler used. bp today 105/60/ HR 116 ; 100% RA. temp 98. cxr report still not on chart. pt requesting iv dilaudid . last dilaudid  taken last evening. Pt c/o dyspnea. Pt placed in tripod position to promote breathing.  Patient has not yet started on the Levaquin .

## 2023-04-18 NOTE — ED Notes (Signed)
 Depends changed with assistance from wife.

## 2023-04-18 NOTE — ED Notes (Signed)
RN aware bed assigned ?

## 2023-04-18 NOTE — Progress Notes (Signed)
 Upon receiving patient initial CBG was 66 and patient unable to drink any fluids, Dr. Chipper Herb notified and amp of D50 administered.  Recheck blood sugar was 190.

## 2023-04-19 ENCOUNTER — Inpatient Hospital Stay (HOSPITAL_COMMUNITY)
Admit: 2023-04-19 | Discharge: 2023-04-19 | Disposition: A | Discharge status: Home / Self Care | Payer: BC Managed Care – PPO | Attending: Internal Medicine

## 2023-04-19 ENCOUNTER — Inpatient Hospital Stay: Payer: BC Managed Care – PPO

## 2023-04-19 DIAGNOSIS — I3139 Other pericardial effusion (noninflammatory): Secondary | ICD-10-CM | POA: Diagnosis not present

## 2023-04-19 DIAGNOSIS — I3131 Malignant pericardial effusion in diseases classified elsewhere: Secondary | ICD-10-CM

## 2023-04-19 DIAGNOSIS — A419 Sepsis, unspecified organism: Secondary | ICD-10-CM | POA: Diagnosis not present

## 2023-04-19 DIAGNOSIS — C250 Malignant neoplasm of head of pancreas: Secondary | ICD-10-CM

## 2023-04-19 LAB — URINALYSIS, W/ REFLEX TO CULTURE (INFECTION SUSPECTED)
Bacteria, UA: NONE SEEN
Bilirubin Urine: NEGATIVE
Glucose, UA: >=500 mg/dL — AB
Hgb urine dipstick: NEGATIVE
Ketones, ur: NEGATIVE mg/dL
Leukocytes,Ua: NEGATIVE
Nitrite: NEGATIVE
Protein, ur: 30 mg/dL — AB
Specific Gravity, Urine: 1.03 (ref 1.005–1.030)
pH: 5 (ref 5.0–8.0)

## 2023-04-19 LAB — CBC
HCT: 24.4 % — ABNORMAL LOW (ref 39.0–52.0)
Hemoglobin: 7.2 g/dL — ABNORMAL LOW (ref 13.0–17.0)
MCH: 25.4 pg — ABNORMAL LOW (ref 26.0–34.0)
MCHC: 29.5 g/dL — ABNORMAL LOW (ref 30.0–36.0)
MCV: 85.9 fL (ref 80.0–100.0)
Platelets: 118 10*3/uL — ABNORMAL LOW (ref 150–400)
RBC: 2.84 MIL/uL — ABNORMAL LOW (ref 4.22–5.81)
RDW: 17.8 % — ABNORMAL HIGH (ref 11.5–15.5)
WBC: 0.4 10*3/uL — CL (ref 4.0–10.5)
nRBC: 0 % (ref 0.0–0.2)

## 2023-04-19 LAB — GLUCOSE, CAPILLARY
Glucose-Capillary: 167 mg/dL — ABNORMAL HIGH (ref 70–99)
Glucose-Capillary: 141 mg/dL — ABNORMAL HIGH (ref 70–99)
Glucose-Capillary: 90 mg/dL (ref 70–99)
Glucose-Capillary: 103 mg/dL — ABNORMAL HIGH (ref 70–99)

## 2023-04-19 LAB — COMPREHENSIVE METABOLIC PANEL
ALT: 16 U/L (ref 0–44)
AST: 16 U/L (ref 15–41)
Albumin: 2 g/dL — ABNORMAL LOW (ref 3.5–5.0)
Alkaline Phosphatase: 537 U/L — ABNORMAL HIGH (ref 38–126)
Anion gap: 6 (ref 5–15)
BUN: 26 mg/dL — ABNORMAL HIGH (ref 8–23)
CO2: 23 mmol/L (ref 22–32)
Calcium: 9.8 mg/dL (ref 8.9–10.3)
Chloride: 120 mmol/L — ABNORMAL HIGH (ref 98–111)
Creatinine, Ser: 0.62 mg/dL (ref 0.61–1.24)
GFR, Estimated: >60 mL/min (ref >60)
Glucose, Bld: 187 mg/dL — ABNORMAL HIGH (ref 70–99)
Potassium: 3.5 mmol/L (ref 3.5–5.1)
Sodium: 149 mmol/L — ABNORMAL HIGH (ref 135–145)
Total Bilirubin: 0.8 mg/dL (ref 0.0–1.2)
Total Protein: 5.3 g/dL — ABNORMAL LOW (ref 6.5–8.1)

## 2023-04-19 LAB — ECHOCARDIOGRAM COMPLETE
AR max vel: 2.83 cm2
AV Peak grad: 8.2 mm[Hg]
Ao pk vel: 1.43 m/s
Area-P 1/2: 4.83 cm2
S' Lateral: 2.2 cm

## 2023-04-19 LAB — MAGNESIUM: Magnesium: 2.2 mg/dL (ref 1.7–2.4)

## 2023-04-19 MED ORDER — FILGRASTIM-SNDZ 480 MCG/0.8ML IJ SOSY
480.0000 ug | PREFILLED_SYRINGE | Freq: Every day | INTRAMUSCULAR | Status: DC
  Administered 2023-04-19: 480 ug via SUBCUTANEOUS
  Filled 2023-04-19 (×3): qty 0.8

## 2023-04-19 MED ORDER — THIAMINE HCL 100 MG PO TABS
100.0000 mg | ORAL_TABLET | Freq: Every day | ORAL | Status: DC
  Administered 2023-04-20 – 2023-04-22 (×3): 100 mg
  Filled 2023-04-19 (×5): qty 1

## 2023-04-19 MED ORDER — CHLORHEXIDINE GLUCONATE CLOTH 2 % EX PADS
6.0000 | MEDICATED_PAD | Freq: Every day | CUTANEOUS | Status: DC
  Administered 2023-04-19 – 2023-04-22 (×4): 6 via TOPICAL

## 2023-04-19 MED ORDER — POTASSIUM CHLORIDE IN NACL 20-0.45 MEQ/L-% IV SOLN
INTRAVENOUS | Status: DC
  Filled 2023-04-19 (×3): qty 1000

## 2023-04-19 MED ORDER — FREE WATER
200.0000 mL | Status: DC
  Administered 2023-04-19 – 2023-04-22 (×18): 200 mL

## 2023-04-19 MED ORDER — PROSOURCE TF20 ENFIT COMPATIBL EN LIQD
60.0000 mL | Freq: Every day | ENTERAL | Status: DC
  Administered 2023-04-20 – 2023-04-22 (×3): 60 mL
  Filled 2023-04-19: qty 60

## 2023-04-19 MED ORDER — JUVEN PO PACK
1.0000 | PACK | Freq: Two times a day (BID) | ORAL | Status: DC
  Administered 2023-04-20 – 2023-04-22 (×5): 1

## 2023-04-19 MED ORDER — OLANZAPINE 5 MG PO TBDP
5.0000 mg | ORAL_TABLET | Freq: Every day | ORAL | Status: DC
  Administered 2023-04-19 – 2023-04-21 (×3): 5 mg via ORAL
  Filled 2023-04-19 (×3): qty 1

## 2023-04-19 MED ORDER — OSMOLITE 1.5 CAL PO LIQD
1000.0000 mL | ORAL | Status: DC
  Administered 2023-04-19 – 2023-04-22 (×3): 1000 mL

## 2023-04-19 MED ORDER — FILGRASTIM-SNDZ 480 MCG/0.8ML IJ SOSY: 480.0000 ug | PREFILLED_SYRINGE | Freq: Every day | INTRAMUSCULAR | Status: DC

## 2023-04-19 NOTE — Progress Notes (Signed)
  Echocardiogram 2D Echocardiogram has been performed.  SAJJAD HONEA 04/19/2023, 8:26 AM

## 2023-04-19 NOTE — Plan of Care (Signed)
 Continuing with plan of care.

## 2023-04-19 NOTE — Progress Notes (Signed)
 Progress Note    Tony Benson  FMW:[id number] DOB: [birth year]  DOA: 04/18/2023 PCP: Weyman Bright, MD      Brief Narrative:    Medical records reviewed and are as summarized below:  Tony Benson is a 63 y.o. male with medical history significant of metastatic pancreatic cancer on palliative chemotherapy, asthma, prostate cancer, IDDM, HTN, HLD, PE on Eliquis , presented with worsening of cough and shortness of breath.   Patient has had multiple aspiration pneumonia since November last year.  He has been following with speech therapy who did a barium swallow recently showed patient has silent aspiration with increased aspiration pneumonia risk.  His wife reported that patient has coughing and choking episodes after eating or drinking.  He was admitted to the hospital for SIRS secondary to aspiration pneumonia and severe neutropenia.     Assessment/Plan:   Principal Problem:   Sepsis (HCC) Active Problems:   Pancreatic cancer metastasized to liver (HCC)   Aspiration pneumonia (HCC)   There is no height or weight on file to calculate BMI.    Sepsis secondary to aspiration pneumonia: Continue IV cefepime  and Flagyl .  Follow-up blood cultures.   Severe neutropenia: Dr. Clista, oncologist, recommended filgrastim  for 5 days ordered to be continued until [facility] greater than 1,000   Dysphagia, severe malnutrition: Discussed enteral nutrition with the patient and his wife at the bedside.  They are agreeable with NG tube placement and enteral nutrition.   Hypernatremia: Start half-normal saline infusion Low normal potassium: Replete intravenously with IV half-normal saline containing potassium chloride .   Large pericardial effusion: No evidence of tamponade.  Repeat 2D echo is pending.   Metastatic pancreatic cancer with metastasis to the liver: S/p gemcitabine  and Abraxane  on 04/11/2023.  Prognosis is poor. Follow-up with oncologist and palliative care   Anemia of  chronic disease: Monitor H&H.  Transfuse as needed.   Type II DM: NovoLog  as needed for hyperglycemia.   History of DVT and pulmonary embolism: Continue Eliquis    Stage II sacral decubitus ulcer: Present on admission.  Continue local wound care.   Transfer from stepdown unit to MedSurg unit.  Plan of care was discussed with patient and his wife at the bedside Plan of care discussed with Burnard, RN, at the bedside.    Diet Order             Diet NPO time specified Except for: Sips with Meds  Diet effective now                            Consultants: Oncologist  Procedures: None    Medications:    Chlorhexidine  Gluconate Cloth  6 each Topical Daily   [START ON 04/20/2023] feeding supplement (PROSource TF20)  60 mL Per Tube Daily   fentaNYL   1 patch Transdermal Q72H   filgrastim -sndz  480 mcg Subcutaneous Q1200   free water   200 mL Per Tube Q4H   insulin  aspart  0-15 Units Subcutaneous TID WC   insulin  aspart  0-5 Units Subcutaneous QHS   mometasone -formoterol   2 puff Inhalation BID   [START ON 04/20/2023] nutrition supplement (JUVEN)  1 packet Per Tube BID BM   OLANZapine   5 mg Oral QHS   mouth rinse  15 mL Mouth Rinse 4 times per day   [START ON 04/20/2023] thiamine   100 mg Per Tube Daily   Continuous Infusions:  ceFEPime  (MAXIPIME ) IV 2 g (04/19/23 1013)  feeding supplement (OSMOLITE 1.5 CAL)     metronidazole  500 mg (04/19/23 0904)     Anti-infectives (From admission, onward)    Start     Dose/Rate Route Frequency Ordered Stop   04/19/23 0000  ceFEPIme  (MAXIPIME ) 2 g in sodium chloride  0.9 % 100 mL IVPB        2 g 200 mL/hr over 30 Minutes Intravenous Every 8 hours 04/18/23 1615     04/18/23 1600  metroNIDAZOLE  (FLAGYL ) IVPB 500 mg        500 mg 100 mL/hr over 60 Minutes Intravenous Every 12 hours 04/18/23 1555     04/18/23 1500  ceFEPIme  (MAXIPIME ) 2 g in sodium chloride  0.9 % 100 mL IVPB        2 g 200 mL/hr over 30 Minutes Intravenous   Once 04/18/23 1456 04/18/23 1627   04/18/23 1500  vancomycin  (VANCOCIN ) IVPB 1000 mg/200 mL premix        1,000 mg 200 mL/hr over 60 Minutes Intravenous  Once 04/18/23 1456 04/18/23 2151   04/18/23 1500  azithromycin  (ZITHROMAX ) 500 mg in sodium chloride  0.9 % 250 mL IVPB        500 mg 250 mL/hr over 60 Minutes Intravenous  Once 04/18/23 1456 04/18/23 1708              Family Communication/Anticipated D/C date and plan/Code Status   DVT prophylaxis: Place TED hose Start: 04/18/23 1725     Code Status: Full Code  Family Communication: Plan discussed with his wife at the bedside Disposition Plan: Plan to discharge home   Status is: Inpatient Remains inpatient appropriate because: Sepsis from pneumonia       Subjective:   Interval events noted.  Patient is unable to speak at this time and he uses on gestures to express himself.  His wife was at the bedside and he said patient has a hoarse voice making it difficult for him to speak.  Patient was encouraged to utter a few words but he did not.  Burnard, RN, was at the bedside  Objective:    Vitals:   04/19/23 0600 04/19/23 0700 04/19/23 0800 04/19/23 0900  BP: (!) 104/53 (!) 104/56 104/61 127/68  Pulse: 94 95 99 (!) 106  Resp: 15 13 (!) 22 (!) 23  Temp:   97.7 F (36.5 C)   TempSrc:   Oral   SpO2: 100% 100% 96% 99%   No data found.   Intake/Output Summary (Last 24 hours) at 04/19/2023 1148 Last data filed at 04/19/2023 0700 Gross per 24 hour  Intake 1964.51 ml  Output 1250 ml  Net 714.51 ml   There were no vitals filed for this visit.  Exam:  GEN: NAD, cachectic SKIN: No rash. Stage II sacral decubitus ulce EYES: No pallor icterus ENT: MMM CV: RRR PULM: CTA B ABD: soft, ND, NT, +BS CNS: Nonverbal and uses gestures to communicate, non focal EXT: No edema or tenderness    Data Reviewed:   I have personally reviewed following labs and imaging studies:  Labs: Labs show the following:   Basic  Metabolic Panel: Recent Labs  Lab 04/15/23 1442 04/17/23 1315 04/18/23 0926 04/18/23 1135 04/19/23 0500  NA 141 143 144 144 149*  K 4.6 4.2 3.8 3.6 3.5  CL 109 112* 115* 115* 120*  CO2 22 21* 20* 17* 23  GLUCOSE 136* 168* 251* 226* 187*  BUN 28* 28* 33* 32* 26*  CREATININE 0.70 0.75 0.89 0.80 0.62  CALCIUM  10.4* 10.2  10.3 10.2 9.8  MG 2.3  --   --   --   --    GFR Estimated Creatinine Clearance: 90.1 mL/min (by C-G formula based on SCr of 0.62 mg/dL). Liver Function Tests: Recent Labs  Lab 04/15/23 1442 04/17/23 1315 04/18/23 0926 04/18/23 1135 04/19/23 0500  AST 38 27 25 36 16  ALT 28 23 21 24 16   ALKPHOS 713* 794* 664* 765* 537*  BILITOT 1.0 1.1 1.1 1.2 0.8  PROT 6.2* 6.3* 5.9* 6.1* 5.3*  ALBUMIN  1.9* 1.9* 1.8* 1.8* 2.0*   No results for input(s): LIPASE, AMYLASE in the last 168 hours. No results for input(s): AMMONIA in the last 168 hours. Coagulation profile Recent Labs  Lab 04/18/23 1135  INR 2.1*    CBC: Recent Labs  Lab 04/15/23 1442 04/17/23 1315 04/18/23 1135 04/19/23 0500  WBC 17.4* 0.6* 0.6* 0.4*  NEUTROABS 16.7* 0.5* 0.4*  --   HGB 8.7* 8.6* 8.5* 7.2*  HCT 28.8* 28.8* 29.0* 24.4*  MCV 83.7 85.0 85.5 85.9  PLT 291 152 131* 118*   Cardiac Enzymes: No results for input(s): CKTOTAL, CKMB, CKMBINDEX, TROPONINI in the last 168 hours. BNP (last 3 results) No results for input(s): PROBNP in the last 8760 hours. CBG: Recent Labs  Lab 04/18/23 1702 04/18/23 1825 04/18/23 2209 04/19/23 0742 04/19/23 1126  GLUCAP 150* 190* 177* 167* 141*   D-Dimer: No results for input(s): DDIMER in the last 72 hours. Hgb A1c: No results for input(s): HGBA1C in the last 72 hours. Lipid Profile: No results for input(s): CHOL, HDL, LDLCALC, TRIG, CHOLHDL, LDLDIRECT in the last 72 hours. Thyroid function studies: No results for input(s): TSH, T4TOTAL, T3FREE, THYROIDAB in the last 72 hours.  Invalid input(s):  FREET3 Anemia work up: No results for input(s): VITAMINB12, FOLATE, FERRITIN, TIBC, IRON, RETICCTPCT in the last 72 hours. Sepsis Labs: Recent Labs  Lab 04/15/23 1442 04/17/23 1315 04/18/23 1135 04/19/23 0500  WBC 17.4* 0.6* 0.6* 0.4*  LATICACIDVEN  --   --  1.8  --     Microbiology Recent Results (from the past 240 hours)  Blood Culture (routine x 2)     Status: None (Preliminary result)   Collection Time: 04/18/23 11:34 AM   Specimen: BLOOD  Result Value Ref Range Status   Specimen Description BLOOD PORTA CATH  Final   Special Requests   Final    BLOOD BOTTLES DRAWN AEROBIC AND ANAEROBIC Blood Culture adequate volume   Culture   Final    NO GROWTH < 24 HOURS Performed at [facility], [address] [place], [place] [address]    Report Status PENDING  Incomplete  Blood Culture (routine x 2)     Status: None (Preliminary result)   Collection Time: 04/18/23 11:36 AM   Specimen: BLOOD  Result Value Ref Range Status   Specimen Description BLOOD RIGHT ANTECUBITAL  Final   Special Requests   Final    BLOOD BOTTLES DRAWN AEROBIC AND ANAEROBIC Blood Culture adequate volume   Culture   Final    NO GROWTH < 24 HOURS Performed at [facility], [address], [place], [place] [address]    Report Status PENDING  Incomplete  Resp panel by RT-PCR (RSV, Flu A&B, Covid) Anterior Nasal Swab     Status: None   Collection Time: 04/18/23 12:30 PM   Specimen: Anterior Nasal Swab  Result Value Ref Range Status   SARS Coronavirus 2 by RT PCR NEGATIVE NEGATIVE Final    Comment: (NOTE)  SARS-CoV-2 target nucleic acids are NOT DETECTED.  The SARS-CoV-2 RNA is generally detectable in upper respiratory specimens during the acute phase of infection. The lowest concentration of SARS-CoV-2 viral copies this assay can detect is 138 copies/mL. A negative result does not preclude SARS-Cov-2 infection and should not be used as the sole basis for treatment  or other patient management decisions. A negative result may occur with  improper specimen collection/handling, submission of specimen other than nasopharyngeal swab, presence of viral mutation(s) within the areas targeted by this assay, and inadequate number of viral copies(<138 copies/mL). A negative result must be combined with clinical observations, patient history, and epidemiological information. The expected result is Negative.  Fact Sheet for Patients:  bloggercourse.com  Fact Sheet for Healthcare Providers:  seriousbroker.it  This test is no t yet approved or cleared by the [place]  FDA and  has been authorized for detection and/or diagnosis of SARS-CoV-2 by FDA under an Emergency Use Authorization (EUA). This EUA will remain  in effect (meaning this test can be used) for the duration of the COVID-19 declaration under Section 564(b)(1) of the Act, 21 U.S.C.section 360bbb-3(b)(1), unless the authorization is terminated  or revoked sooner.       Influenza A by PCR NEGATIVE NEGATIVE Final   Influenza B by PCR NEGATIVE NEGATIVE Final    Comment: (NOTE) The Xpert Xpress SARS-CoV-2/FLU/RSV plus assay is intended as an aid in the diagnosis of influenza from Nasopharyngeal swab specimens and should not be used as a sole basis for treatment. Nasal washings and aspirates are unacceptable for Xpert Xpress SARS-CoV-2/FLU/RSV testing.  Fact Sheet for Patients: bloggercourse.com  Fact Sheet for Healthcare Providers: seriousbroker.it  This test is not yet approved or cleared by the [place]  FDA and has been authorized for detection and/or diagnosis of SARS-CoV-2 by FDA under an Emergency Use Authorization (EUA). This EUA will remain in effect (meaning this test can be used) for the duration of the COVID-19 declaration under Section 564(b)(1) of the Act, 21 U.S.C. section  360bbb-3(b)(1), unless the authorization is terminated or revoked.     Resp Syncytial Virus by PCR NEGATIVE NEGATIVE Final    Comment: (NOTE) Fact Sheet for Patients: bloggercourse.com  Fact Sheet for Healthcare Providers: seriousbroker.it  This test is not yet approved or cleared by the [place]  FDA and has been authorized for detection and/or diagnosis of SARS-CoV-2 by FDA under an Emergency Use Authorization (EUA). This EUA will remain in effect (meaning this test can be used) for the duration of the COVID-19 declaration under Section 564(b)(1) of the Act, 21 U.S.C. section 360bbb-3(b)(1), unless the authorization is terminated or revoked.  Performed at [facility], [address] [place], [place] [address]   MRSA Next Gen by PCR, Nasal     Status: None   Collection Time: 04/18/23  5:46 PM   Specimen: Nasal Mucosa; Nasal Swab  Result Value Ref Range Status   MRSA by PCR Next Gen NOT DETECTED NOT DETECTED Final    Comment: (NOTE) The GeneXpert MRSA Assay (FDA approved for NASAL specimens only), is one component of a comprehensive MRSA colonization surveillance program. It is not intended to diagnose MRSA infection nor to guide or monitor treatment for MRSA infections. Test performance is not FDA approved in patients less than 62 years old. Performed at [facility], [address] [place], [place] [address]     Procedures and diagnostic studies:  DG Chest 2 View Result Date: 04/18/2023 CLINICAL DATA:  Cough.  Wheezing.  Shortness of breath. EXAM: CHEST - 2 VIEW COMPARISON:  03/31/2023. FINDINGS: Redemonstration of right basilar opacity with associated small right pleural effusion without significant interval change. Bilateral lung fields are otherwise clear. Left lateral costophrenic angle is clear. Stable cardio-mediastinal silhouette. No acute osseous abnormalities. The soft tissues are within  normal limits. Right-sided CT Port-A-Cath is seen with its tip overlying the lower portion of superior vena cava. IMPRESSION: *Essentially stable exam. Right basilar opacity and small right pleural effusion, without significant interval change. Electronically Signed   By: Ree Molt M.D.   On: 04/18/2023 14:21   CT Angio Chest PE W and/or Wo Contrast Result Date: 04/18/2023 CLINICAL DATA:  shortness of breath, tachycardia, hx of blood clots with missed blood thinner dose, evaluating PE vs aspiration PNA EXAM: CT ANGIOGRAPHY CHEST WITH CONTRAST TECHNIQUE: Multidetector CT imaging of the chest was performed using the standard protocol during bolus administration of intravenous contrast. Multiplanar CT image reconstructions and MIPs were obtained to evaluate the vascular anatomy. RADIATION DOSE REDUCTION: This exam was performed according to the departmental dose-optimization program which includes automated exposure control, adjustment of the mA and/or kV according to patient size and/or use of iterative reconstruction technique. CONTRAST:  75mL OMNIPAQUE  IOHEXOL  350 MG/ML SOLN COMPARISON:  CT angiography chest from 03/31/2023. FINDINGS: Cardiovascular: No evidence of embolism to the proximal subsegmental pulmonary artery level. Normal cardiac size. Redemonstration of moderate-to-large pericardial effusion without significant interval change. No aortic aneurysm. There are coronary artery calcifications, in keeping with coronary artery disease. There are also mild peripheral atherosclerotic vascular calcifications of thoracic aorta and its major branches. Mediastinum/Nodes: Visualized thyroid gland appears grossly unremarkable. No solid / cystic mediastinal masses. The esophagus is nondistended precluding optimal assessment. No axillary, mediastinal or hilar lymphadenopathy by size criteria. Lungs/Pleura: There are retained mucus secretions along the periphery of the upper trachea. There is interval increase in  the peribronchovascular opacities mainly in the right lung with middle lobe/lower lobe predominance. There also slightly irregular/nodular interlobular septal thickening in the right lung. There is also irregularity along the right major fissure and minor fissure. There are multiple ill-defined predominantly centrilobular nodules in the left lung lower lobe with interval increase when compared to the prior exam. No new dense consolidation or lung collapse. Redemonstration of small loculated right pleural effusion. No left pleural effusion. No pneumothorax. Redemonstration of pleural calcifications in the right posteroinferior hemithorax, nonspecific but commonly seen as a sequela of prior asbestos related disease. Upper Abdomen: Redemonstration of extensive, ill-defined, hypoattenuating areas in the visualized liver, compatible with metastasis. There is dilated main pancreatic duct in the body/tail region with associated atrophy of the pancreas. Remaining visualized upper abdominal viscera are within normal limits. Musculoskeletal: The visualized soft tissues of the chest wall are grossly unremarkable. No suspicious osseous lesions. There are mild multilevel degenerative changes in the visualized spine. Review of the MIP images confirms the above findings. IMPRESSION: 1. No embolism the proximal subsegmental pulmonary artery level. 2. Interval increase in the previously seen peri-bronchovascular opacities mainly in the right lung. There is also slightly irregular/nodular interlobular septal thickening in the right lung. Small loculated right pleural effusion. 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 Correlate clinically. 3. Extensive ill-defined hypoattenuating areas in the liver, compatible with metastases. 4. Moderate-to-large pericardial effusion without significant interval change. 5. Multiple other nonacute observations, as described above. Aortic Atherosclerosis  (ICD10-I70.0). Electronically Signed   By: Ree Molt M.D.   On: 04/18/2023 14:19  LOS: 1 day   Samyah Bilbo  [facility]   Pager on [email or web address]. If 7PM-7AM, please contact night-coverage at [email or web address]     04/19/2023, 11:48 AM

## 2023-04-19 NOTE — Plan of Care (Addendum)
   Problem: Education: Goal: Ability to describe self-care measures that may prevent or decrease complications (Diabetes Survival Skills Education) will improve Outcome: Progressing   Problem: Coping: Goal: Ability to adjust to condition or change in health will improve Outcome: Progressing   Problem: Fluid Volume: Goal: Ability to maintain a balanced intake and output will improve Outcome: Progressing   Problem: Education: Goal: Knowledge of General Education information will improve Description: Including pain rating scale, medication(s)/side effects and non-pharmacologic comfort measures Outcome: Progressing

## 2023-04-19 NOTE — Progress Notes (Signed)
 I was informed by Burnard, RN, that patient was having intermittent jerking of the right upper extremity.  I went back to the bedside to assess the patient.  Upon arrival, patient was on his feet supported by [facility], daughter, on his right side and Burnard, [employer]DR[employer], on his left side.  His wife was also in the room.  He was trying to pass urine into the urinal bottle but he was unable to.  Bladder scan had revealed over 760 mL of urine.  He did not want to use a Foley catheter and wanted to try to urinate on his own.  He was slowly lowered onto the bed.  He was asked to stretch his upper extremities.  Upon stretching both upper extremities, tremor of the right hand and forearm was noted.  This is not consistent with seizures.  Continue to monitor patient.  Patient was offered indwelling catheter or Foley catheter for acute urinary retention.  He is undecided at this time.

## 2023-04-19 NOTE — Progress Notes (Signed)
 Initial Nutrition Assessment  DOCUMENTATION CODES:   Not applicable  INTERVENTION:   Once NGT in place, recommend:  Osmolite 1.5@65ml /hr- Initiate at 25ml/hr and increase by 10ml/hr q 8 hours until goal rate is reached.   ProSource TF 20- Give 60ml daily via tube, each supplement provides 80kcal and 20g of protein.   Free water  flushes q4 hours   Regimen provides 2420kcal/day, 118g/day protein and 239ml/day of free water .   Pt at high refeed risk; recommend monitor potassium, magnesium  and phosphorus labs daily until stable  Juven Fruit Punch BID via tube, each serving provides 95kcal and 2.5g of protein (amino acids glutamine and arginine)  Thiamine  100mg  daily via tube x 7 days  Daily weights   NUTRITION DIAGNOSIS:   Inadequate oral intake related to dysphagia as evidenced by NPO status.  GOAL:   Patient will meet greater than or equal to 90% of their needs  MONITOR:   Diet advancement, Labs, Weight trends, TF tolerance, Skin, I & O's  REASON FOR ASSESSMENT:   Consult Enteral/tube feeding initiation and management  ASSESSMENT:   63 y.o. male with medical history significant of metastatic pancreatic cancer on palliative chemotherapy, asthma, prostate cancer s/p prostatectomy (2023), IDDM, HTN, HLD, PE on Eliquis , chronic pain, diverticulosis, asthma, OSA, anxiety and dysphagia with silent aspiration (followed by o/p SLP) who is admitted with  aspiration PNA and sepsis  RD working remotely.  Per chart review, pt noted to have dysphagia pta. Pt has been followed by SLP since 12/20; pt s/p MBSS and was found to have silent aspiration and was recommended for NPO. Per chart, pt appears to have lost ~20lbs since August but appears weight stable since November. Pt is requesting full care at this time. Plan is for NGT placement and nutrition support. Pt is at high refeed risk. Pt is at high risk for malnutrition. RD will obtain nutrition related history and exam at  follow-up.   Medications reviewed and include: insulin , cefepime , metronidazole   Labs reviewed: Na 149(H), K 3.5 wnl, BUN 26(H) Wbc- 0.4(L), Hgb 7.2(L), Hct 24.4(L) Cbgs- 167, 177, 190 x 48 hrs  AIC 10.3(H)- 11/15  NUTRITION - FOCUSED PHYSICAL EXAM: Unable to perform at this time   Diet Order:   Diet Order             Diet NPO time specified Except for: Sips with Meds  Diet effective now                  EDUCATION NEEDS:   No education needs have been identified at this time  Skin:  Skin Assessment: Reviewed RN Assessment (Stage II coccyx, NPI scrotum)  Last BM:  1/3- type 6  Height:   Ht Readings from Last 1 Encounters:  04/17/23 5' 10 (1.778 m)    Weight:   Wt Readings from Last 1 Encounters:  04/17/23 66.5 kg    Ideal Body Weight:  75.45 kg  Estimated Nutritional Needs:   Kcal:  2100-2400kcal/day  Protein:  105-120g/day  Fluid:  2.0-2.3L/day  Augustin Shams MS, RD, LDN If unable to be reached, please send secure chat to RD inpatient available from 8:00a-4:00p daily

## 2023-04-19 NOTE — Progress Notes (Signed)
 Notified NP Jon Billings about hemoglobin of 7.2, no current active bleeding. No new orders at this time.

## 2023-04-19 NOTE — Progress Notes (Signed)
 Patient was noted to have new tremors the right arm, Dr. Jens notified and came to bedside and assessed patient, will continue to monitor patient.  Patient was also bladder scanned, as he had not voided, which revealed . Dr. Jens updated and foley offered to patient, patient initially wanted to attempt to void once more, but after void was unsuccessful patient was willing to accept foley.  Foley was placed and patient tolerated well.

## 2023-04-19 NOTE — Consult Note (Addendum)
 Cardiology Consultation:  Patient ID: Tony Benson MRN: [id number]; DOB: 20-Jun-1960  Admit date: 04/18/2023 Date of Consult: 04/19/2023  Primary Care Provider: Weyman Bright, MD Primary Cardiologist: Lonni Hanson, MD  Primary Electrophysiologist:  None   Patient Profile:  Tony Benson is a 63 y.o. male with a hx of pancreatic cancer on palliative chemotherapy, asthma, diabetes, hypertension, hyperlipidemia, PE on Eliquis  who is being seen today for the evaluation of a large pericardial effusion at the request of [name], MD.  History of Present Illness:  Mr. Tony Benson was admitted yesterday to the hospital with concerns for sepsis secondary to aspiration pneumonia and severe neutropenia.  He is currently on palliative chemotherapy for metastatic pancreatic cancer.  This is not curable.  He has severe protein malnourishment.  His weight is 66 kg.  He is thin and appears emaciated.  He had an echocardiogram performed this hospitalization to follow a known large pericardial effusion.  The echocardiogram shows normal LV and normal RV function.  There is no overt RV diastolic collapse.  He was admitted to the hospital for pneumonia in December.  He was also found to have a large pericardial effusion at that time.  He was offered pericardiocentesis but the patient declined.  He is now back with recurrent infection and the effusion appears somewhat similar.  He has no elevated JVD.  He is tachycardic.  Blood pressure 100/50.  He is not on pressors.  Is not in shock.  He does not appear to be grossly volume overloaded but does have some edema.  He reports no chest pain.  He is minimally interactive.  Vitals notable for temperature 97.7 heart rate 106 BP 127/68.  Lactic acid 1.8.  WBC 0.4 hemoglobin 7.2 platelets 118.  A CT PE study was obtained on arrival.  This shows no evidence of pleural effusions.  Past Medical History: Past Medical History:  Diagnosis Date   Asthma    Cancer (HCC)     prostate   Diabetes mellitus without complication (HCC)    type II   Hyperlipidemia    Hypertension    Sleep apnea    uses Cpap machine     Past Surgical History: Past Surgical History:  Procedure Laterality Date   CHOLECYSTECTOMY     COLONOSCOPY WITH PROPOFOL  N/A 11/18/2018   Procedure: COLONOSCOPY WITH PROPOFOL ;  Surgeon: Janalyn Keene NOVAK, MD;  Location: [facility] ENDOSCOPY;  Service: Gastroenterology;  Laterality: N/A;   COLONOSCOPY WITH PROPOFOL  N/A 02/05/2023   Procedure: COLONOSCOPY WITH PROPOFOL ;  Surgeon: Unk Corinn Skiff, MD;  Location: [facility] ENDOSCOPY;  Service: Gastroenterology;  Laterality: N/A;   IR IMAGING GUIDED PORT INSERTION  03/24/2023   IR US  LIVER BIOPSY  03/03/2023   LAPAROSCOPIC RETROPUBIC PROSTATECTOMY     2023   LUNG SURGERY     ligation of thoracic duct   PROSTATE BIOPSY N/A 09/21/2020   Procedure: PROSTATE BIOPSY GRAYCE;  Surgeon: Kassie Ozell SAUNDERS, MD;  Location: [facility] ORS;  Service: Urology;  Laterality: N/A;     Allergies:    Allergies  Allergen Reactions   Other Anaphylaxis    peanuts   Morphine And Codeine Itching   Tree Extract Other (See Comments)    ALMONDS  ---- EYE/ FACIAL SWELLING   Wound Dressing Adhesive Rash    Social History:   Social History   Socioeconomic History   Marital status: Married    Spouse name: Not on file   Number of children: Not on file   Years of  education: Not on file   Highest education level: Not on file  Occupational History   Occupation: Automotive Engineer   Tobacco Use   Smoking status: Never   Smokeless tobacco: Never  Vaping Use   Vaping status: Never Used  Substance and Sexual Activity   Alcohol use: No   Drug use: No   Sexual activity: Not on file  Other Topics Concern   Not on file  Social History Narrative   Not on file   Social Drivers of Health   Financial Resource Strain: Not on file  Food Insecurity: No Food Insecurity (04/18/2023)   Hunger Vital Sign    Worried About Running Out of  Food in the Last Year: Never true    Ran Out of Food in the Last Year: Never true  Transportation Needs: No Transportation Needs (04/18/2023)   PRAPARE - Administrator, Civil Service (Medical): No    Lack of Transportation (Non-Medical): No  Physical Activity: Not on file  Stress: Not on file  Social Connections: Not on file  Intimate Partner Violence: Not At Risk (04/18/2023)   Humiliation, Afraid, Rape, and Kick questionnaire    Fear of Current or Ex-Partner: No    Emotionally Abused: No    Physically Abused: No    Sexually Abused: No     Family History:    Family History  Problem Relation Age of Onset   Allergies Sister      ROS:  All other ROS reviewed and negative. Pertinent positives noted in the HPI.     Physical Exam/Data:   Vitals:   04/19/23 0600 04/19/23 0700 04/19/23 0800 04/19/23 0900  BP: (!) 104/53 (!) 104/56 104/61 127/68  Pulse: 94 95 99 (!) 106  Resp: 15 13 (!) 22 (!) 23  Temp:   97.7 F (36.5 C)   TempSrc:   Oral   SpO2: 100% 100% 96% 99%    Intake/Output Summary (Last 24 hours) at 04/19/2023 1323 Last data filed at 04/19/2023 0700 Gross per 24 hour  Intake 964.51 ml  Output 1250 ml  Net -285.49 ml       04/17/2023    1:00 PM 04/11/2023   10:07 AM 03/31/2023    7:10 PM  Last 3 Weights  Weight (lbs) 146 lb 9.6 oz 155 lb 9.6 oz 149 lb 0.5 oz  Weight (kg) 66.497 kg 70.58 kg 67.6 kg    There is no height or weight on file to calculate BMI.  General: Well nourished, well developed, in no acute distress Head: Atraumatic, normal size  Eyes: PEERLA, EOMI  Neck: Supple, no JVD Endocrine: No thryomegaly Cardiac: Normal S1, S2; RRR; no murmurs, rubs, or gallops Lungs: Clear to auscultation bilaterally, no wheezing, rhonchi or rales  Abd: Soft, nontender, no hepatomegaly  Ext: No edema, pulses 2+ Musculoskeletal: No deformities, BUE and BLE strength normal and equal Skin: Warm and dry, no rashes   Neuro: Alert and oriented to person, place,  time, and situation, CNII-XII grossly intact, no focal deficits  Psych: Normal mood and affect   EKG:  The EKG was personally reviewed and demonstrates: Sinus tachycardia heart rate 120, nonspecific ST-T changes Telemetry:  Telemetry was personally reviewed and demonstrates: Sinus tachycardia heart rate 100s  Relevant CV Studies: TTE 04/19/2023  1. Large pericardial effusion up to 2.1 cm in the apical region of the  LV. It appears similar to the prior study. There is no overt RV diastolic  collapse. There is late systolic  collapse of the RA, which is a  nonspecific finding. There is 25% variation  in MV inflow noted. The IVC is dilated. Overall, the effusion appears  similar to the prior study with no overt features of tamponde, but several  features suggest possible early tamponade. Clinical correlation is  recommended. Large pericardial effusion.  The pericardial effusion is circumferential.   2. Left ventricular ejection fraction, by estimation, is 60 to 65%. The  left ventricle has normal function. The left ventricle has no regional  wall motion abnormalities. There is mild concentric left ventricular  hypertrophy. Left ventricular diastolic  parameters were normal.   3. Right ventricular systolic function is normal. The right ventricular  size is normal. There is moderately elevated pulmonary artery systolic  pressure. The estimated right ventricular systolic pressure is 45.5 mmHg.   4. The mitral valve is grossly normal. Trivial mitral valve  regurgitation. No evidence of mitral stenosis.   5. The aortic valve is tricuspid. Aortic valve regurgitation is not  visualized. No aortic stenosis is present.   6. The inferior vena cava is dilated in size with >50% respiratory  variability, suggesting right atrial pressure of 8 mmHg.   Assessment and Plan:   # Large circumferential pericardial effusion, no evidence of tamponade # Malignant pericardial effusion # Metastatic pancreatic  CA -Known history of metastatic pancreatic cancer.  Now admitted with sepsis and aspiration pneumonia.  He has a known history of a large pericardial effusion. -On his echo today he redemonstrates his large pericardial effusion.  Is up to 2.1 cm largest at the LV apex. -No evidence of RV diastolic collapse.  His IVC is dilated, but no overt features of tamponade are present. -Clinically, on examination he does not appear to be in tamponade.  He has no elevated JVD.  His CT PE study shows no evidence of pleural effusions.  He really does not have evidence of worsening congestive heart failure. -He does have lower extremity edema but he has an albumin  of 2.0.  He has severe protein malnourishment.  He is cachectic on examination. -I discussed with the patient I do not believe he is in tamponade.  I do not believe pericardiocentesis is indicated.  I also discussed with his wife that I do not believe aggressive procedures are indicated.  His blood pressure is normal his lactic acid is normal.  I think for now I would defer pericardiocentesis and follow this clinically. -Primary team will continue with ongoing goals of care discussions.      For questions or updates, please contact [facility] Please consult [email or web address] for contact info under     Signed, Darryle T. Barbaraann, MD, [facility]  [facility]  04/19/2023 1:23 PM

## 2023-04-20 ENCOUNTER — Inpatient Hospital Stay: Payer: BC Managed Care – PPO

## 2023-04-20 DIAGNOSIS — I3131 Malignant pericardial effusion in diseases classified elsewhere: Secondary | ICD-10-CM | POA: Diagnosis not present

## 2023-04-20 DIAGNOSIS — A419 Sepsis, unspecified organism: Secondary | ICD-10-CM | POA: Diagnosis not present

## 2023-04-20 LAB — BASIC METABOLIC PANEL
Anion gap: 9 (ref 5–15)
BUN: 22 mg/dL (ref 8–23)
CO2: 21 mmol/L — ABNORMAL LOW (ref 22–32)
Calcium: 10.1 mg/dL (ref 8.9–10.3)
Chloride: 119 mmol/L — ABNORMAL HIGH (ref 98–111)
Creatinine, Ser: 0.7 mg/dL (ref 0.61–1.24)
GFR, Estimated: >60 mL/min (ref >60)
Glucose, Bld: 162 mg/dL — ABNORMAL HIGH (ref 70–99)
Potassium: 3.4 mmol/L — ABNORMAL LOW (ref 3.5–5.1)
Sodium: 149 mmol/L — ABNORMAL HIGH (ref 135–145)

## 2023-04-20 LAB — CBC WITH DIFFERENTIAL/PLATELET
Abs Immature Granulocytes: 0.02 10*3/uL (ref 0.00–0.07)
Basophils Absolute: 0 10*3/uL (ref 0.0–0.1)
Basophils Relative: 2 %
Eosinophils Absolute: 0.1 10*3/uL (ref 0.0–0.5)
Eosinophils Relative: 7 %
HCT: 26.8 % — ABNORMAL LOW (ref 39.0–52.0)
Hemoglobin: 7.9 g/dL — ABNORMAL LOW (ref 13.0–17.0)
Immature Granulocytes: 2 %
Lymphocytes Relative: 7 %
Lymphs Abs: 0.1 10*3/uL — ABNORMAL LOW (ref 0.7–4.0)
MCH: 25.1 pg — ABNORMAL LOW (ref 26.0–34.0)
MCHC: 29.5 g/dL — ABNORMAL LOW (ref 30.0–36.0)
MCV: 85.1 fL (ref 80.0–100.0)
Monocytes Absolute: 0.4 10*3/uL (ref 0.1–1.0)
Monocytes Relative: 27 %
Neutro Abs: 0.8 10*3/uL — ABNORMAL LOW (ref 1.7–7.7)
Neutrophils Relative %: 55 %
Platelets: 145 10*3/uL — ABNORMAL LOW (ref 150–400)
RBC: 3.15 MIL/uL — ABNORMAL LOW (ref 4.22–5.81)
RDW: 18.1 % — ABNORMAL HIGH (ref 11.5–15.5)
WBC: 1.4 10*3/uL — CL (ref 4.0–10.5)
nRBC: 5.1 % — ABNORMAL HIGH (ref 0.0–0.2)

## 2023-04-20 LAB — PHOSPHORUS: Phosphorus: 1.3 mg/dL — ABNORMAL LOW (ref 2.5–4.6)

## 2023-04-20 LAB — MAGNESIUM: Magnesium: 2 mg/dL (ref 1.7–2.4)

## 2023-04-20 LAB — GLUCOSE, CAPILLARY
Glucose-Capillary: 161 mg/dL — ABNORMAL HIGH (ref 70–99)
Glucose-Capillary: 155 mg/dL — ABNORMAL HIGH (ref 70–99)
Glucose-Capillary: 135 mg/dL — ABNORMAL HIGH (ref 70–99)
Glucose-Capillary: 180 mg/dL — ABNORMAL HIGH (ref 70–99)

## 2023-04-20 MED ORDER — GLYCOPYRROLATE 0.2 MG/ML IJ SOLN
0.4000 mg | Freq: Once | INTRAMUSCULAR | Status: AC
  Administered 2023-04-20: 0.4 mg via INTRAVENOUS
  Filled 2023-04-20: qty 2

## 2023-04-20 MED ORDER — HEPARIN SODIUM (PORCINE) 5000 UNIT/ML IJ SOLN
5000.0000 [IU] | Freq: Three times a day (TID) | INTRAMUSCULAR | Status: DC
Start: 2023-04-20 — End: 2023-04-22
  Administered 2023-04-20 – 2023-04-22 (×7): 5000 [IU] via SUBCUTANEOUS
  Filled 2023-04-20 (×7): qty 1

## 2023-04-20 MED ORDER — FILGRASTIM-AAFI 480 MCG/0.8ML IJ SOSY
480.0000 ug | PREFILLED_SYRINGE | Freq: Every day | INTRAMUSCULAR | Status: DC
  Administered 2023-04-20: 480 ug via SUBCUTANEOUS
  Filled 2023-04-20: qty 0.8

## 2023-04-20 MED ORDER — GLYCOPYRROLATE 0.2 MG/ML IJ SOLN
0.2000 mg | INTRAMUSCULAR | Status: DC | PRN
  Administered 2023-04-20 – 2023-04-22 (×9): 0.2 mg via INTRAVENOUS
  Filled 2023-04-20 (×9): qty 1

## 2023-04-20 MED ORDER — SCOPOLAMINE 1 MG/3DAYS TD PT72
1.0000 | MEDICATED_PATCH | TRANSDERMAL | Status: DC
  Administered 2023-04-20: 1.5 mg via TRANSDERMAL
  Filled 2023-04-20: qty 1

## 2023-04-20 MED ORDER — IPRATROPIUM-ALBUTEROL 0.5-2.5 (3) MG/3ML IN SOLN
3.0000 mL | Freq: Four times a day (QID) | RESPIRATORY_TRACT | Status: DC
Start: 2023-04-20 — End: 2023-04-22
  Administered 2023-04-20 – 2023-04-22 (×8): 3 mL via RESPIRATORY_TRACT
  Filled 2023-04-20 (×8): qty 3

## 2023-04-20 MED ORDER — POTASSIUM PHOSPHATES 15 MMOLE/5ML IV SOLN
30.0000 mmol | Freq: Once | INTRAVENOUS | Status: AC
  Administered 2023-04-20: 30 mmol via INTRAVENOUS
  Filled 2023-04-20: qty 10

## 2023-04-20 MED ORDER — ACETYLCYSTEINE 20 % IN SOLN
4.0000 mL | Freq: Two times a day (BID) | RESPIRATORY_TRACT | Status: DC
  Administered 2023-04-20 – 2023-04-22 (×4): 4 mL via RESPIRATORY_TRACT
  Filled 2023-04-20 (×4): qty 4

## 2023-04-20 NOTE — Plan of Care (Signed)
 Continuing with plan of care.

## 2023-04-20 NOTE — Progress Notes (Signed)
 Patient noted to have difficulty breathing and sitting straight up in the bed.  Oxygen  saturations down to the low 90s and respirations in the 30s.  [place] at 4L placed to patient's relief.  Patient verbalized pain and prn medication administered.  MD notified and stat CXR was ordered.  MD to bedside, at this time will continue to monitor patient.

## 2023-04-20 NOTE — Progress Notes (Signed)
 Visited with pt per RN request-pt is unable to clearly speak to this chaplain-chaplain asked if he wanted a prayer an he nodded "yes" Chaplain offered prayer over pt. Wife walked in and is now at bedside-offered to come visit later today that was welcomed

## 2023-04-20 NOTE — Progress Notes (Signed)
 Patient's urine output has declined throughout the shift to minimal amounts at this point; provider aware, will continue to monitor patient.

## 2023-04-20 NOTE — Plan of Care (Signed)
  Problem: Coping: Goal: Ability to adjust to condition or change in health will improve Outcome: Progressing   Problem: Fluid Volume: Goal: Ability to maintain a balanced intake and output will improve Outcome: Not Progressing   Problem: Nutritional: Goal: Maintenance of adequate nutrition will improve Outcome: Not Progressing

## 2023-04-20 NOTE — Progress Notes (Signed)
 Cardiology Progress Note  Patient ID: Tony Benson MRN: [id number] DOB: Oct 17, 1960 Date of Encounter: 04/20/2023 Primary Cardiologist: Lonni Hanson, MD  Subjective   Chief Complaint: SOB  HPI: Worsening shortness of breath.  Chest x-ray with consolidation of entire right lung fields.  Blood pressures are stable.  Remains tachycardic.  ROS:  All other ROS reviewed and negative. Pertinent positives noted in the HPI.     Telemetry  Overnight telemetry shows sinus tachycardia heart rate 120s, which I personally reviewed.   Physical Exam   Vitals:   04/20/23 0800 04/20/23 0900 04/20/23 1000 04/20/23 1100  BP: (!) 98/52 (!) 104/57 112/62 99/63  Pulse: (!) 106 (!) 104 (!) 111 (!) 113  Resp: 20 (!) 22 (!) 23 (!) 32  Temp: 97.7 F (36.5 C)     TempSrc: Oral     SpO2: 97% 99% 97% 98%  Weight:      Height:        Intake/Output Summary (Last 24 hours) at 04/20/2023 1219 Last data filed at 04/20/2023 1100 Gross per 24 hour  Intake 3769.18 ml  Output 1100 ml  Net 2669.18 ml       04/20/2023    3:50 AM 04/18/2023    5:00 PM 04/17/2023    1:00 PM  Last 3 Weights  Weight (lbs) 142 lb 3.2 oz 146 lb 13.2 oz 146 lb 9.6 oz  Weight (kg) 64.5 kg 66.6 kg 66.497 kg    Body mass index is 20.4 kg/m.  General: Ill-appearing, tachypnea noted Head: Atraumatic, normal size  Eyes: PEERLA, EOMI  Neck: Supple, JVD 7 8 cmH2O Endocrine: No thryomegaly Cardiac: Normal S1, S2; tachycardia, no murmurs Lungs: Absent breath sounds on the right lung fields Abd: Soft, nontender, no hepatomegaly  Ext: 2+ pitting edema Musculoskeletal: No deformities, BUE and BLE strength normal and equal Skin: Warm and dry, no rashes   Neuro: Alert and oriented to person, place, time, and situation, CNII-XII grossly intact, no focal deficits  Psych: Normal mood and affect   Cardiac Studies  TTE 04/19/2023  1. Large pericardial effusion up to 2.1 cm in the apical region of the  LV. It appears similar to the prior study.  There is no overt RV diastolic  collapse. There is late systolic collapse of the RA, which is a  nonspecific finding. There is 25% variation  in MV inflow noted. The IVC is dilated. Overall, the effusion appears  similar to the prior study with no overt features of tamponde, but several  features suggest possible early tamponade. Clinical correlation is  recommended. Large pericardial effusion.  The pericardial effusion is circumferential.   2. Left ventricular ejection fraction, by estimation, is 60 to 65%. The  left ventricle has normal function. The left ventricle has no regional  wall motion abnormalities. There is mild concentric left ventricular  hypertrophy. Left ventricular diastolic  parameters were normal.   3. Right ventricular systolic function is normal. The right ventricular  size is normal. There is moderately elevated pulmonary artery systolic  pressure. The estimated right ventricular systolic pressure is 45.5 mmHg.   4. The mitral valve is grossly normal. Trivial mitral valve  regurgitation. No evidence of mitral stenosis.   5. The aortic valve is tricuspid. Aortic valve regurgitation is not  visualized. No aortic stenosis is present.   6. The inferior vena cava is dilated in size with >50% respiratory  variability, suggesting right atrial pressure of 8 mmHg.   Patient Profile  Tony Benson  is a 63 y.o. male with metastatic pancreatic cancer on palliative chemotherapy, asthma, diabetes, hypertension, hyperlipidemia, PE on Eliquis  who was admitted on 04/19/2023 with sepsis secondary to aspiration pneumonia and severe neutropenia.  Cardiology was consulted for large pericardial effusion.  Assessment & Plan   # Large pericardial effusion # Malignant pericardial effusion -Known history of a large pericardial effusion in December.  Pericardiocentesis was deferred at that time.  Now admitted back to the hospital with pneumonia and neutropenia. -Repeat echo shows the effusion  is largely unchanged.  There is no overt diastolic collapse of the RV. The effusion is large and IVC is dilated.  -He clinically does not appear to be in tamponade or cardiogenic shock.  Blood pressure is stable at 102/64.  He is not requiring any pressors. -Discussed with the patient and his family that his overall prognosis is poor.  I do not believe his current illness is related to worsening pericardial effusion. -The patient is also very thin and emaciated.  He is not a great candidate for pericardiocentesis.  Also with anemia that seems to be stable.  He has metastatic pancreatic cancer that is incurable.  Would recommend ongoing palliative care discussions. -we can repeat his echo tomorrow to make sure there is no drastic change.   # Sepsis # Pneumonia # Consolidation entire right lung field -Would benefit from repeat chest CT.  His respiratory status is worsening -Continue antibiotics per primary team. -repeat echo tomorrow.   # Metastatic pancreatic cancer # Neutropenia -Incurable cancer.  He is very emaciated and malnourished.  For now it appears the patient would like to remain full code.  In the setting remains malignancy would recommend palliative care consultation.  He is critically ill with incurable cancer.  # Anemia -2/2 chemo/CA  # History of PE -AC held in setting of anemia.      For questions or updates, please contact [facility] Please consult [email or web address] for contact info under     Signed, Darryle T. Barbaraann, MD, [facility] [place]  [facility]  04/20/2023 12:19 PM

## 2023-04-20 NOTE — Progress Notes (Addendum)
 Progress Note    VINICIO LYNK  FMW:[id number] DOB: [birth year]  DOA: 04/18/2023 PCP: Weyman Bright, MD      Brief Narrative:    Medical records reviewed and are as summarized below:  [name] is a 63 y.o. male with medical history significant of metastatic pancreatic cancer on palliative chemotherapy, asthma, prostate cancer, IDDM, HTN, HLD, PE on Eliquis , presented with worsening of cough and shortness of breath.   Patient has had multiple aspiration pneumonia since November last year.  He has been following with speech therapy who did a barium swallow recently showed patient has silent aspiration with increased aspiration pneumonia risk.  His wife reported that patient has coughing and choking episodes after eating or drinking.  He was admitted to the hospital for SIRS secondary to aspiration pneumonia and severe neutropenia.     Assessment/Plan:   Principal Problem:   Sepsis (HCC) Active Problems:   Pancreatic cancer metastasized to liver (HCC)   Aspiration pneumonia (HCC)   Body mass index is 20.4 kg/m.    Sepsis secondary to aspiration pneumonia: Repeat chest x-ray today showed enlarging large right pleural effusion with increasing atelectasis and/or consolidation throughout nearly the entire right lung, probable left-sided bronchopneumonia.  Continue IV cefepime  and Flagyl .  Consulted respiratory therapist for chest physiotherapy.  No growth on blood cultures thus far. Plan for right-sided thoracentesis tomorrow. Consulted Dr. Aleskerov, intensivist, to assist with management.   Acute hypoxic respiratory failure: He is on 4 L/min oxygen  via nasal cannula.   Severe neutropenia: WBC up from 0.4-1.4.  Continue filgrastim  until ANC greater than 1,000.     Dysphagia, severe malnutrition: Discussed enteral nutrition with the patient and his wife at the bedside.  They are agreeable with NG tube placement and enteral nutrition.   Hypernatremia: Continue  half-normal saline infusion with potassium chloride . Hypokalemia: Continue potassium repletion   Large pericardial effusion: No evidence of tamponade.  Repeat 2D echo on 04/19/2023 showed large pericardial effusion without cardiac tamponade, EF estimated at 60 to 65%. Repeat echo has been ordered by cardiologist, Dr. Barbaraann.   Metastatic pancreatic cancer with metastasis to the liver: S/p gemcitabine  and Abraxane  on 04/11/2023.  Prognosis is poor. Follow-up with oncologist and palliative care   Anemia of chronic disease: H&H stable.  No indication for blood transfusion at this time.     Type II DM: NovoLog  as needed for hyperglycemia.   History of DVT and pulmonary embolism: Eliquis  has been held   Stage II sacral decubitus ulcer: Present on admission.  Continue local wound care.   Transfer from MedSurg status to stepdown status   Plan of care was discussed with Burnard, RN,    Diet Order             Diet NPO time specified Except for: Sips with Meds  Diet effective now                            Consultants: Oncologist  Procedures: None    Medications:    Chlorhexidine  Gluconate Cloth  6 each Topical Daily   feeding supplement (PROSource TF20)  60 mL Per Tube Daily   fentaNYL   1 patch Transdermal Q72H   filgrastim -sndz  480 mcg Subcutaneous Q1200   free water   200 mL Per Tube Q4H   heparin  injection (subcutaneous)  5,000 Units Subcutaneous Q8H   insulin  aspart  0-15 Units Subcutaneous TID WC   insulin  aspart  0-5 Units Subcutaneous QHS   mometasone -formoterol   2 puff Inhalation BID   nutrition supplement (JUVEN)  1 packet Per Tube BID BM   OLANZapine  zydis  5 mg Oral QHS   mouth rinse  15 mL Mouth Rinse 4 times per day   scopolamine   1 patch Transdermal Q72H   thiamine   100 mg Per Tube Daily   Continuous Infusions:  ceFEPime  (MAXIPIME ) IV Stopped (04/20/23 0947)   feeding supplement (OSMOLITE 1.5 CAL) 35 mL/hr at 04/20/23 1200    metronidazole  Stopped (04/20/23 1052)   potassium PHOSPHATE  30 mmol in dextrose  5 % 250 mL infusion 30 mmol (04/20/23 1240)     Anti-infectives (From admission, onward)    Start     Dose/Rate Route Frequency Ordered Stop   04/19/23 0000  ceFEPIme  (MAXIPIME ) 2 g in sodium chloride  0.9 % 100 mL IVPB        2 g 200 mL/hr over 30 Minutes Intravenous Every 8 hours 04/18/23 1615     04/18/23 1600  metroNIDAZOLE  (FLAGYL ) IVPB 500 mg        500 mg 100 mL/hr over 60 Minutes Intravenous Every 12 hours 04/18/23 1555     04/18/23 1500  ceFEPIme  (MAXIPIME ) 2 g in sodium chloride  0.9 % 100 mL IVPB        2 g 200 mL/hr over 30 Minutes Intravenous  Once 04/18/23 1456 04/18/23 1627   04/18/23 1500  vancomycin  (VANCOCIN ) IVPB 1000 mg/200 mL premix        1,000 mg 200 mL/hr over 60 Minutes Intravenous  Once 04/18/23 1456 04/18/23 2151   04/18/23 1500  azithromycin  (ZITHROMAX ) 500 mg in sodium chloride  0.9 % 250 mL IVPB        500 mg 250 mL/hr over 60 Minutes Intravenous  Once 04/18/23 1456 04/18/23 1708              Family Communication/Anticipated D/C date and plan/Code Status   DVT prophylaxis: heparin  injection 5,000 Units Start: 04/20/23 1400 Place TED hose Start: 04/18/23 1725     Code Status: Full Code  Family Communication: None Disposition Plan: Plan to discharge home   Status is: Inpatient Remains inpatient appropriate because: Sepsis from pneumonia       Subjective:   Interval events noted.  Burnard, RN, reported that patient had difficulty breathing earlier this morning and was tachypneic.  He was given IV Dilaudid  and this seemed to have helped with his breathing.  Currently, patient indicates that he is short of breath but he is not in extremis.  Objective:    Vitals:   04/20/23 0900 04/20/23 1000 04/20/23 1100 04/20/23 1200  BP: (!) 104/57 112/62 99/63 102/64  Pulse: (!) 104 (!) 111 (!) 113 (!) 114  Resp: (!) 22 (!) 23 (!) 32 12  Temp:    97.8 F (36.6 C)   TempSrc:    Axillary  SpO2: 99% 97% 98% 100%  Weight:      Height:       No data found.   Intake/Output Summary (Last 24 hours) at 04/20/2023 1251 Last data filed at 04/20/2023 1200 Gross per 24 hour  Intake 3804.18 ml  Output 1100 ml  Net 2704.18 ml   Filed Weights   04/18/23 1700 04/20/23 0350  Weight: 66.6 kg 64.5 kg    Exam:  GEN: NAD but ill-looking SKIN: Warm and dry EYES: No pallor or icterus ENT: MMM CV: RRR PULM: Bilateral rales, no wheezing ABD: soft, ND, NT, +BS CNS: Non verbal  but able to use gestures to communicate EXT: B/l leg edema, no tenderness     Data Reviewed:   I have personally reviewed following labs and imaging studies:  Labs: Labs show the following:   Basic Metabolic Panel: Recent Labs  Lab 04/15/23 1442 04/17/23 1315 04/18/23 0926 04/18/23 1135 04/19/23 0500 04/20/23 0328  NA 141 143 144 144 149* 149*  K 4.6 4.2 3.8 3.6 3.5 3.4*  CL 109 112* 115* 115* 120* 119*  CO2 22 21* 20* 17* 23 21*  GLUCOSE 136* 168* 251* 226* 187* 162*  BUN 28* 28* 33* 32* 26* 22  CREATININE 0.70 0.75 0.89 0.80 0.62 0.70  CALCIUM  10.4* 10.2 10.3 10.2 9.8 10.1  MG 2.3  --   --   --  2.2 2.0  PHOS  --   --   --   --   --  1.3*   GFR Estimated Creatinine Clearance: 87.3 mL/min (by C-G formula based on SCr of 0.7 mg/dL). Liver Function Tests: Recent Labs  Lab 04/15/23 1442 04/17/23 1315 04/18/23 0926 04/18/23 1135 04/19/23 0500  AST 38 27 25 36 16  ALT 28 23 21 24 16   ALKPHOS 713* 794* 664* 765* 537*  BILITOT 1.0 1.1 1.1 1.2 0.8  PROT 6.2* 6.3* 5.9* 6.1* 5.3*  ALBUMIN  1.9* 1.9* 1.8* 1.8* 2.0*   No results for input(s): LIPASE, AMYLASE in the last 168 hours. No results for input(s): AMMONIA in the last 168 hours. Coagulation profile Recent Labs  Lab 04/18/23 1135  INR 2.1*    CBC: Recent Labs  Lab 04/15/23 1442 04/17/23 1315 04/18/23 1135 04/19/23 0500 04/20/23 0328  WBC 17.4* 0.6* 0.6* 0.4* 1.4*  NEUTROABS 16.7* 0.5*  0.4*  --  0.8*  HGB 8.7* 8.6* 8.5* 7.2* 7.9*  HCT 28.8* 28.8* 29.0* 24.4* 26.8*  MCV 83.7 85.0 85.5 85.9 85.1  PLT 291 152 131* 118* 145*   Cardiac Enzymes: No results for input(s): CKTOTAL, CKMB, CKMBINDEX, TROPONINI in the last 168 hours. BNP (last 3 results) No results for input(s): PROBNP in the last 8760 hours. CBG: Recent Labs  Lab 04/19/23 1126 04/19/23 1710 04/19/23 2121 04/20/23 0755 04/20/23 1153  GLUCAP 141* 90 103* 161* 155*   D-Dimer: No results for input(s): DDIMER in the last 72 hours. Hgb A1c: No results for input(s): HGBA1C in the last 72 hours. Lipid Profile: No results for input(s): CHOL, HDL, LDLCALC, TRIG, CHOLHDL, LDLDIRECT in the last 72 hours. Thyroid function studies: No results for input(s): TSH, T4TOTAL, T3FREE, THYROIDAB in the last 72 hours.  Invalid input(s): FREET3 Anemia work up: No results for input(s): VITAMINB12, FOLATE, FERRITIN, TIBC, IRON, RETICCTPCT in the last 72 hours. Sepsis Labs: Recent Labs  Lab 04/17/23 1315 04/18/23 1135 04/19/23 0500 04/20/23 0328  WBC 0.6* 0.6* 0.4* 1.4*  LATICACIDVEN  --  1.8  --   --     Microbiology Recent Results (from the past 240 hours)  Blood Culture (routine x 2)     Status: None (Preliminary result)   Collection Time: 04/18/23 11:34 AM   Specimen: BLOOD  Result Value Ref Range Status   Specimen Description BLOOD PORTA CATH  Final   Special Requests   Final    BLOOD BOTTLES DRAWN AEROBIC AND ANAEROBIC Blood Culture adequate volume   Culture   Final    NO GROWTH 2 DAYS Performed at [facility], [address], [place], [place] [address]    Report Status PENDING  Incomplete  Blood Culture (routine x  2)     Status: None (Preliminary result)   Collection Time: 04/18/23 11:36 AM   Specimen: BLOOD  Result Value Ref Range Status   Specimen Description BLOOD RIGHT ANTECUBITAL  Final   Special Requests   Final    BLOOD BOTTLES DRAWN  AEROBIC AND ANAEROBIC Blood Culture adequate volume   Culture   Final    NO GROWTH 2 DAYS Performed at [facility], [address], [place], [place] [address]    Report Status PENDING  Incomplete  Resp panel by RT-PCR (RSV, Flu A&B, Covid) Anterior Nasal Swab     Status: None   Collection Time: 04/18/23 12:30 PM   Specimen: Anterior Nasal Swab  Result Value Ref Range Status   SARS Coronavirus 2 by RT PCR NEGATIVE NEGATIVE Final    Comment: (NOTE) SARS-CoV-2 target nucleic acids are NOT DETECTED.  The SARS-CoV-2 RNA is generally detectable in upper respiratory specimens during the acute phase of infection. The lowest concentration of SARS-CoV-2 viral copies this assay can detect is 138 copies/mL. A negative result does not preclude SARS-Cov-2 infection and should not be used as the sole basis for treatment or other patient management decisions. A negative result may occur with  improper specimen collection/handling, submission of specimen other than nasopharyngeal swab, presence of viral mutation(s) within the areas targeted by this assay, and inadequate number of viral copies(<138 copies/mL). A negative result must be combined with clinical observations, patient history, and epidemiological information. The expected result is Negative.  Fact Sheet for Patients:  bloggercourse.com  Fact Sheet for Healthcare Providers:  seriousbroker.it  This test is no t yet approved or cleared by the [place]  FDA and  has been authorized for detection and/or diagnosis of SARS-CoV-2 by FDA under an Emergency Use Authorization (EUA). This EUA will remain  in effect (meaning this test can be used) for the duration of the COVID-19 declaration under Section 564(b)(1) of the Act, 21 U.S.C.section 360bbb-3(b)(1), unless the authorization is terminated  or revoked sooner.       Influenza A by PCR NEGATIVE NEGATIVE Final   Influenza B  by PCR NEGATIVE NEGATIVE Final    Comment: (NOTE) The Xpert Xpress SARS-CoV-2/FLU/RSV plus assay is intended as an aid in the diagnosis of influenza from Nasopharyngeal swab specimens and should not be used as a sole basis for treatment. Nasal washings and aspirates are unacceptable for Xpert Xpress SARS-CoV-2/FLU/RSV testing.  Fact Sheet for Patients: bloggercourse.com  Fact Sheet for Healthcare Providers: seriousbroker.it  This test is not yet approved or cleared by the [place]  FDA and has been authorized for detection and/or diagnosis of SARS-CoV-2 by FDA under an Emergency Use Authorization (EUA). This EUA will remain in effect (meaning this test can be used) for the duration of the COVID-19 declaration under Section 564(b)(1) of the Act, 21 U.S.C. section 360bbb-3(b)(1), unless the authorization is terminated or revoked.     Resp Syncytial Virus by PCR NEGATIVE NEGATIVE Final    Comment: (NOTE) Fact Sheet for Patients: bloggercourse.com  Fact Sheet for Healthcare Providers: seriousbroker.it  This test is not yet approved or cleared by the [place]  FDA and has been authorized for detection and/or diagnosis of SARS-CoV-2 by FDA under an Emergency Use Authorization (EUA). This EUA will remain in effect (meaning this test can be used) for the duration of the COVID-19 declaration under Section 564(b)(1) of the Act, 21 U.S.C. section 360bbb-3(b)(1), unless the authorization is terminated or revoked.  Performed at [facility], [phone]  [address] [place], [place] [address]   MRSA Next Gen by PCR, Nasal     Status: None   Collection Time: 04/18/23  5:46 PM   Specimen: Nasal Mucosa; Nasal Swab  Result Value Ref Range Status   MRSA by PCR Next Gen NOT DETECTED NOT DETECTED Final    Comment: (NOTE) The GeneXpert MRSA Assay (FDA approved for NASAL specimens  only), is one component of a comprehensive MRSA colonization surveillance program. It is not intended to diagnose MRSA infection nor to guide or monitor treatment for MRSA infections. Test performance is not FDA approved in patients less than 33 years old. Performed at [facility], [address] [place], [place] [address]     Procedures and diagnostic studies:  DG Chest [facility] 1 View Result Date: 04/20/2023 CLINICAL DATA:  63 year old male with history of acute respiratory failure. EXAM: PORTABLE CHEST 1 VIEW COMPARISON:  Chest x-ray 04/29/2023. FINDINGS: A feeding tube is seen extending into the abdomen, however, the tip of the feeding tube extends below the lower margin of the image. Right internal jugular single-lumen Port-A-Cath with tip terminating at the superior cavoatrial junction. Substantially worsened aeration throughout the right lung which is now nearly completely opacified, indicative of progressive atelectasis and/or consolidation, also with superimposed large right pleural effusion. Patchy areas of interstitial prominence an ill-defined opacities scattered throughout the left lung, concerning for bronchopneumonia. No left pleural effusion. No pneumothorax. No evidence of pulmonary edema. Heart size appears normal. Mediastinal contours are distorted by patient positioning and rotation to the right. IMPRESSION: 1. Enlarging large right pleural effusion with increasing atelectasis and/or consolidation throughout nearly the entire right lung, substantially worsened compared with yesterday's examination. 2. Probable left-sided bronchopneumonia. Electronically Signed   By: Toribio Aye M.D.   On: 04/20/2023 08:17   DG Chest Port 1 View Result Date: 04/19/2023 CLINICAL DATA:  Feeding tube placement EXAM: PORTABLE CHEST 1 VIEW COMPARISON:  X-ray 04/17/2023 and older.  CTA 04/18/2023. FINDINGS: Feeding tube in place with tip extending into the left upper quadrant, likely along the  midbody of the stomach. Separate right IJ catheter with the tip overlying the SVC right atrial junction. Underinflation with persistent right-sided pleural effusion and adjacent opacity. Patchy parenchymal opacities as well as left mid to lower lung, more pronounced in the prior x-ray but today's x-rays also less well inflated. No pneumothorax. No left effusion. Stable cardiopericardial silhouette. IMPRESSION: Feeding tube with the tip overlying the midbody of the stomach. Increasing parenchymal opacities in the left lung base. Persistent patchy opacities in the right lung with a right-sided pleural effusion. Chest port Recommend follow up Electronically Signed   By: Ranell Bring M.D.   On: 04/19/2023 14:17   ECHOCARDIOGRAM COMPLETE Result Date: 04/19/2023    ECHOCARDIOGRAM REPORT   Patient Name:   CAPRICE WASKO Date of Exam: 04/19/2023 Medical Rec #:  [id number]     Height:       70.0 in Accession #:    [id number]    Weight:       146.6 lb Date of Birth:  [birth year]    BSA:          1.829 m Patient Age:    62 years      BP:           104/56 mmHg Patient Gender: M             HR:           96 bpm. Exam Location:  [facility] Procedure: 2D Echo Indications:     Pericardial effusion I31.3  History:         Patient has prior history of Echocardiogram examinations, most                  recent 04/01/2023.  Sonographer:     Thedora Louder RDCS, FASE Referring Phys:  [id number] CORT ONEIDA MANA Diagnosing Phys: Darryle Decent MD IMPRESSIONS  1. Large pericardial effusion up to 2.1 cm in the apical region of the LV. It appears similar to the prior study. There is no overt RV diastolic collapse. There is late systolic collapse of the RA, which is a nonspecific finding. There is 25% variation in MV inflow noted. The IVC is dilated. Overall, the effusion appears similar to the prior study with no overt features of tamponde, but several features suggest possible early tamponade. Clinical correlation is recommended. Large pericardial  effusion. The pericardial effusion is circumferential.  2. Left ventricular ejection fraction, by estimation, is 60 to 65%. The left ventricle has normal function. The left ventricle has no regional wall motion abnormalities. There is mild concentric left ventricular hypertrophy. Left ventricular diastolic parameters were normal.  3. Right ventricular systolic function is normal. The right ventricular size is normal. There is moderately elevated pulmonary artery systolic pressure. The estimated right ventricular systolic pressure is 45.5 mmHg.  4. The mitral valve is grossly normal. Trivial mitral valve regurgitation. No evidence of mitral stenosis.  5. The aortic valve is tricuspid. Aortic valve regurgitation is not visualized. No aortic stenosis is present.  6. The inferior vena cava is dilated in size with >50% respiratory variability, suggesting right atrial pressure of 8 mmHg. FINDINGS  Left Ventricle: Left ventricular ejection fraction, by estimation, is 60 to 65%. The left ventricle has normal function. The left ventricle has no regional wall motion abnormalities. The left ventricular internal cavity size was normal in size. There is  mild concentric left ventricular hypertrophy. Left ventricular diastolic parameters were normal. Right Ventricle: The right ventricular size is normal. No increase in right ventricular wall thickness. Right ventricular systolic function is normal. There is moderately elevated pulmonary artery systolic pressure. The tricuspid regurgitant velocity is 2.76 m/s, and with an assumed right atrial pressure of 15 mmHg, the estimated right ventricular systolic pressure is 45.5 mmHg. Left Atrium: Left atrial size was normal in size. Right Atrium: Right atrial size was normal in size. Pericardium: Large pericardial effusion up to 2.1 cm in the apical region of the LV. It appears similar to the prior study. There is no overt RV diastolic collapse. There is late systolic collapse of the RA,  which is a nonspecific finding. There is 25% variation in MV inflow noted. The IVC is dilated. Overall, the effusion appears similar to the prior study with no overt features of tamponde, but several features suggest possible early tamponade. Clinical correlation is recommended. A large pericardial  effusion is present. The pericardial effusion is circumferential. Mitral Valve: The mitral valve is grossly normal. Trivial mitral valve regurgitation. No evidence of mitral valve stenosis. Tricuspid Valve: The tricuspid valve is grossly normal. Tricuspid valve regurgitation is trivial. No evidence of tricuspid stenosis. Aortic Valve: The aortic valve is tricuspid. Aortic valve regurgitation is not visualized. No aortic stenosis is present. Aortic valve peak gradient measures 8.2 mmHg. Pulmonic Valve: The pulmonic valve was grossly normal. Pulmonic valve regurgitation is not visualized. No evidence of pulmonic stenosis. Aorta: The aortic root and ascending aorta are structurally normal, with no  evidence of dilitation. Venous: The inferior vena cava is dilated in size with greater than 50% respiratory variability, suggesting right atrial pressure of 8 mmHg. IAS/Shunts: The atrial septum is grossly normal.  LEFT VENTRICLE PLAX 2D LVIDd:         3.40 cm   Diastology LVIDs:         2.20 cm   LV e' medial:    10.70 cm/s LV PW:         1.30 cm   LV E/e' medial:  9.3 LV IVS:        1.20 cm   LV e' lateral:   17.70 cm/s LVOT diam:     2.10 cm   LV E/e' lateral: 5.6 LV SV:         83 LV SV Index:   45 LVOT Area:     3.46 cm  RIGHT VENTRICLE RV Basal diam:  3.00 cm RV S prime:     22.40 cm/s TAPSE (M-mode): 1.7 cm LEFT ATRIUM             Index        RIGHT ATRIUM          Index LA diam:        2.80 cm 1.53 cm/m   RA Area:     8.44 cm LA Vol (A2C):   41.2 ml 22.52 ml/m  RA Volume:   16.60 ml 9.08 ml/m LA Vol (A4C):   26.9 ml 14.71 ml/m LA Biplane Vol: 34.9 ml 19.08 ml/m  AORTIC VALVE                 PULMONIC VALVE AV Area  (Vmax): 2.83 cm     PV Vmax:        1.07 m/s AV Vmax:        143.00 cm/s  PV Peak grad:   4.6 mmHg AV Peak Grad:   8.2 mmHg     RVOT Peak grad: 3 mmHg LVOT Vmax:      117.00 cm/s LVOT Vmean:     78.800 cm/s LVOT VTI:       0.239 m  AORTA Ao Root diam: 3.70 cm Ao Asc diam:  3.40 cm MITRAL VALVE               TRICUSPID VALVE MV Area (PHT): 4.83 cm    TR Peak grad:   30.5 mmHg MV Decel Time: 157 msec    TR Vmax:        276.00 cm/s MV E velocity: 99.00 cm/s MV A velocity: 87.00 cm/s  SHUNTS MV E/A ratio:  1.14        Systemic VTI:  0.24 m                            Systemic Diam: 2.10 cm Darryle Decent MD Electronically signed by Darryle Decent MD Signature Date/Time: 04/19/2023/12:50:02 PM    Final                LOS: 2 days   Smokey Melott  [facility]   Pager on [email or web address]. If 7PM-7AM, please contact night-coverage at [email or web address]     04/20/2023, 12:51 PM

## 2023-04-20 NOTE — Progress Notes (Signed)
 Patient was noted to have a lot of mucous in the upper airway he is unable to productively cough out, oxygen  saturation dippingto the high 80s and low 90s, heart rate 120-130, along with respirations in the 30s; prn medications given and Dr. Jens notified.  Dr. Jens reached out to the intensive care doctor, Dr. Aleskerov.  Dr. Parris and NP to bedside and spoke extensively with the patient, daughter, and patient's wife discussing plan of care and what could be offered.  At this time will continue to monitor patient.

## 2023-04-21 ENCOUNTER — Inpatient Hospital Stay (HOSPITAL_COMMUNITY)
Admit: 2023-04-21 | Discharge: 2023-04-21 | Disposition: A | Discharge status: Home / Self Care | Payer: BC Managed Care – PPO | Attending: Cardiovascular Disease | Admitting: Cardiovascular Disease

## 2023-04-21 ENCOUNTER — Inpatient Hospital Stay: Payer: BC Managed Care – PPO

## 2023-04-21 DIAGNOSIS — A419 Sepsis, unspecified organism: Secondary | ICD-10-CM | POA: Diagnosis not present

## 2023-04-21 DIAGNOSIS — I3139 Other pericardial effusion (noninflammatory): Secondary | ICD-10-CM | POA: Diagnosis not present

## 2023-04-21 LAB — BASIC METABOLIC PANEL
Anion gap: 8 (ref 5–15)
BUN: 32 mg/dL — ABNORMAL HIGH (ref 8–23)
CO2: 20 mmol/L — ABNORMAL LOW (ref 22–32)
Calcium: 9.9 mg/dL (ref 8.9–10.3)
Chloride: 121 mmol/L — ABNORMAL HIGH (ref 98–111)
Creatinine, Ser: 0.76 mg/dL (ref 0.61–1.24)
GFR, Estimated: >60 mL/min (ref >60)
Glucose, Bld: 245 mg/dL — ABNORMAL HIGH (ref 70–99)
Potassium: 4.1 mmol/L (ref 3.5–5.1)
Sodium: 149 mmol/L — ABNORMAL HIGH (ref 135–145)

## 2023-04-21 LAB — CBC WITH DIFFERENTIAL/PLATELET
Abs Immature Granulocytes: 0.53 10*3/uL — ABNORMAL HIGH (ref 0.00–0.07)
Basophils Absolute: 0 10*3/uL (ref 0.0–0.1)
Basophils Relative: 0 %
Eosinophils Absolute: 0.5 10*3/uL (ref 0.0–0.5)
Eosinophils Relative: 3 %
HCT: 26.8 % — ABNORMAL LOW (ref 39.0–52.0)
Hemoglobin: 7.8 g/dL — ABNORMAL LOW (ref 13.0–17.0)
Immature Granulocytes: 3 %
Lymphocytes Relative: 2 %
Lymphs Abs: 0.4 10*3/uL — ABNORMAL LOW (ref 0.7–4.0)
MCH: 25.2 pg — ABNORMAL LOW (ref 26.0–34.0)
MCHC: 29.1 g/dL — ABNORMAL LOW (ref 30.0–36.0)
MCV: 86.7 fL (ref 80.0–100.0)
Monocytes Absolute: 1.5 10*3/uL — ABNORMAL HIGH (ref 0.1–1.0)
Monocytes Relative: 9 %
Neutro Abs: 13.2 10*3/uL — ABNORMAL HIGH (ref 1.7–7.7)
Neutrophils Relative %: 83 %
Platelets: 166 10*3/uL (ref 150–400)
RBC: 3.09 MIL/uL — ABNORMAL LOW (ref 4.22–5.81)
RDW: 18.6 % — ABNORMAL HIGH (ref 11.5–15.5)
Smear Review: NORMAL
WBC: 16 10*3/uL — ABNORMAL HIGH (ref 4.0–10.5)
nRBC: 2.1 % — ABNORMAL HIGH (ref 0.0–0.2)

## 2023-04-21 LAB — GLUCOSE, CAPILLARY
Glucose-Capillary: 204 mg/dL — ABNORMAL HIGH (ref 70–99)
Glucose-Capillary: 185 mg/dL — ABNORMAL HIGH (ref 70–99)
Glucose-Capillary: 154 mg/dL — ABNORMAL HIGH (ref 70–99)
Glucose-Capillary: 184 mg/dL — ABNORMAL HIGH (ref 70–99)

## 2023-04-21 LAB — ECHOCARDIOGRAM LIMITED
Area-P 1/2: 9.03 cm2
Height: 70 in
S' Lateral: 2.8 cm
Weight: 2321 [oz_av]

## 2023-04-21 LAB — PHOSPHORUS: Phosphorus: 2 mg/dL — ABNORMAL LOW (ref 2.5–4.6)

## 2023-04-21 LAB — MAGNESIUM: Magnesium: 1.9 mg/dL (ref 1.7–2.4)

## 2023-04-21 MED ORDER — POLYETHYLENE GLYCOL 3350 17 G PO PACK
17.0000 g | PACK | Freq: Every day | ORAL | Status: DC | PRN
  Administered 2023-04-21: 17 g via NASOGASTRIC
  Filled 2023-04-21: qty 1

## 2023-04-21 MED ORDER — K PHOS MONO-SOD PHOS DI & MONO 155-852-130 MG PO TABS
500.0000 mg | ORAL_TABLET | Freq: Once | ORAL | Status: AC
  Administered 2023-04-21: 500 mg
  Filled 2023-04-21: qty 2

## 2023-04-21 MED ORDER — OXYCODONE HCL 5 MG PO TABS
5.0000 mg | ORAL_TABLET | Freq: Three times a day (TID) | ORAL | Status: DC | PRN
  Administered 2023-04-21 – 2023-04-22 (×2): 5 mg via NASOGASTRIC
  Filled 2023-04-21 (×2): qty 1

## 2023-04-21 MED ORDER — SENNOSIDES-DOCUSATE SODIUM 8.6-50 MG PO TABS: 1.0000 | ORAL_TABLET | Freq: Every evening | ORAL | Status: DC | PRN

## 2023-04-21 MED ORDER — K PHOS MONO-SOD PHOS DI & MONO 155-852-130 MG PO TABS
500.0000 mg | ORAL_TABLET | Freq: Once | ORAL | Status: DC
  Filled 2023-04-21: qty 2

## 2023-04-21 MED ORDER — ADULT MULTIVITAMIN W/MINERALS CH
1.0000 | ORAL_TABLET | Freq: Every day | ORAL | Status: DC
  Administered 2023-04-21 – 2023-04-22 (×2): 1
  Filled 2023-04-21 (×2): qty 1

## 2023-04-21 NOTE — Progress Notes (Addendum)
 Progress Note    QUADRE BRISTOL  FMW:[id number] DOB: [birth year]  DOA: 04/18/2023 PCP: Weyman Bright, MD      Brief Narrative:    Medical records reviewed and are as summarized below:  [name] is a 63 y.o. male with medical history significant of metastatic pancreatic cancer on palliative chemotherapy, asthma, prostate cancer, IDDM, HTN, HLD, PE on Eliquis , presented with worsening of cough and shortness of breath.   Patient has had multiple aspiration pneumonia since November last year.  He has been following with speech therapy who did a barium swallow recently showed patient has silent aspiration with increased aspiration pneumonia risk.  His wife reported that patient has coughing and choking episodes after eating or drinking.  He was admitted to the hospital for SIRS secondary to aspiration pneumonia and severe neutropenia.     Assessment/Plan:   Principal Problem:   Sepsis (HCC) Active Problems:   Pancreatic cancer metastasized to liver (HCC)   Aspiration pneumonia (HCC)   Body mass index is 20.81 kg/m.    Sepsis secondary to aspiration pneumonia: Continue IV cefepime  and Flagyl .  No growth on blood cultures thus far.  MRSA screen was negative.   Large right pleural effusion/consolidation in right hemithorax: Ordered right-sided thoracentesis.   Acute hypoxic respiratory failure: Improving.  Oxygen  saturation on room air greater than 90%.   Severe neutropenia: Resolved. Leukocytosis likely secondary to filgrastim    Dysphagia, severe malnutrition: Continue enteral nutrition.   Hypernatremia: Hold IV fluids to avoid fluid overload. Use free water  flushes via NG tube. Hypokalemia: Improved   Large pericardial effusion: No evidence of tamponade.  Repeat 2D echo on 04/19/2023 showed large pericardial effusion without cardiac tamponade, EF estimated at 60 to 65%. Repeat echo has been ordered by cardiologist, Dr. Barbaraann.   Metastatic pancreatic  cancer with metastasis to the liver: S/p gemcitabine  and Abraxane  on 04/11/2023.  Analgesics as needed for pain.  Prognosis is poor. Follow-up with oncologist and palliative care   Anemia of chronic disease: H&H stable.  No indication for blood transfusion at this time.     Type II DM: NovoLog  as needed for hyperglycemia.   History of DVT and pulmonary embolism: Eliquis  has been held   Stage II sacral decubitus ulcer: Present on admission.  Continue local wound care.   General weakness: PT and OT evaluation  Diagnoses, prognosis and goals of care were discussed with the patient, his wife and daughter at the bedside.  He wants to remain full code at this time. Plan discussed with Corean, RN, at the bedside    Diet Order             Diet NPO time specified Except for: Sips with Meds  Diet effective now                            Consultants: Oncologist  Procedures: None    Medications:    acetylcysteine   4 mL Nebulization BID   Chlorhexidine  Gluconate Cloth  6 each Topical Daily   feeding supplement (PROSource TF20)  60 mL Per Tube Daily   fentaNYL   1 patch Transdermal Q72H   free water   200 mL Per Tube Q4H   heparin  injection (subcutaneous)  5,000 Units Subcutaneous Q8H   insulin  aspart  0-15 Units Subcutaneous TID WC   insulin  aspart  0-5 Units Subcutaneous QHS   ipratropium-albuterol   3 mL Nebulization QID   mometasone -formoterol   2  puff Inhalation BID   nutrition supplement (JUVEN)  1 packet Per Tube BID BM   OLANZapine  zydis  5 mg Oral QHS   mouth rinse  15 mL Mouth Rinse 4 times per day   scopolamine   1 patch Transdermal Q72H   thiamine   100 mg Per Tube Daily   Continuous Infusions:  ceFEPime  (MAXIPIME ) IV 2 g (04/21/23 0854)   feeding supplement (OSMOLITE 1.5 CAL) 55 mL/hr at 04/21/23 0503   metronidazole  500 mg (04/21/23 1004)     Anti-infectives (From admission, onward)    Start     Dose/Rate Route Frequency Ordered Stop    04/19/23 0000  ceFEPIme  (MAXIPIME ) 2 g in sodium chloride  0.9 % 100 mL IVPB        2 g 200 mL/hr over 30 Minutes Intravenous Every 8 hours 04/18/23 1615     04/18/23 1600  metroNIDAZOLE  (FLAGYL ) IVPB 500 mg        500 mg 100 mL/hr over 60 Minutes Intravenous Every 12 hours 04/18/23 1555     04/18/23 1500  ceFEPIme  (MAXIPIME ) 2 g in sodium chloride  0.9 % 100 mL IVPB        2 g 200 mL/hr over 30 Minutes Intravenous  Once 04/18/23 1456 04/18/23 1627   04/18/23 1500  vancomycin  (VANCOCIN ) IVPB 1000 mg/200 mL premix        1,000 mg 200 mL/hr over 60 Minutes Intravenous  Once 04/18/23 1456 04/18/23 2151   04/18/23 1500  azithromycin  (ZITHROMAX ) 500 mg in sodium chloride  0.9 % 250 mL IVPB        500 mg 250 mL/hr over 60 Minutes Intravenous  Once 04/18/23 1456 04/18/23 1708              Family Communication/Anticipated D/C date and plan/Code Status   DVT prophylaxis: heparin  injection 5,000 Units Start: 04/20/23 1400 Place TED hose Start: 04/18/23 1725     Code Status: Full Code  Family Communication: Plan discussed with his wife and daughter at the bedside Disposition Plan: Plan to discharge home   Status is: Inpatient Remains inpatient appropriate because: Sepsis from pneumonia       Subjective:   Interval events noted.  He has no complaints.  He gave thumbs up indicating that he feels better today.  His wife and daughter were at the bedside.  Objective:    Vitals:   04/21/23 0739 04/21/23 0800 04/21/23 0900 04/21/23 1000  BP:  109/62 104/67 (!) 102/55  Pulse:  (!) 113 (!) 121 (!) 121  Resp:  (!) 29 17 (!) 31  Temp: 98.8 F (37.1 C)     TempSrc: Axillary     SpO2:  100% 97% 95%  Weight:      Height:       No data found.   Intake/Output Summary (Last 24 hours) at 04/21/2023 1051 Last data filed at 04/21/2023 0800 Gross per 24 hour  Intake 919.02 ml  Output 550 ml  Net 369.02 ml   Filed Weights   04/18/23 1700 04/20/23 0350 04/21/23 0500  Weight: 66.6  kg 64.5 kg 65.8 kg    Exam:  GEN: NAD SKIN: Warm and dry EYES: No pallor or icterus ENT: MMM CV: RRR, tachycardic PULM: Reduced air entry on right hemithorax.  Bilateral rales ABD: soft, ND, NT, +BS CNS: He still not talking any use of gestures for communication.  Non focal.  Tremors noted on left hand/forearm EXT: Bilateral leg edema, no erythema or tenderness    Data Reviewed:  I have personally reviewed following labs and imaging studies:  Labs: Labs show the following:   Basic Metabolic Panel: Recent Labs  Lab 04/15/23 1442 04/17/23 1315 04/18/23 0926 04/18/23 1135 04/19/23 0500 04/20/23 0328 04/21/23 0434  NA 141   < > 144 144 149* 149* 149*  K 4.6   < > 3.8 3.6 3.5 3.4* 4.1  CL 109   < > 115* 115* 120* 119* 121*  CO2 22   < > 20* 17* 23 21* 20*  GLUCOSE 136*   < > 251* 226* 187* 162* 245*  BUN 28*   < > 33* 32* 26* 22 32*  CREATININE 0.70   < > 0.89 0.80 0.62 0.70 0.76  CALCIUM  10.4*   < > 10.3 10.2 9.8 10.1 9.9  MG 2.3  --   --   --  2.2 2.0 1.9  PHOS  --   --   --   --   --  1.3* 2.0*   < > = values in this interval not displayed.   GFR Estimated Creatinine Clearance: 89.1 mL/min (by C-G formula based on SCr of 0.76 mg/dL). Liver Function Tests: Recent Labs  Lab 04/15/23 1442 04/17/23 1315 04/18/23 0926 04/18/23 1135 04/19/23 0500  AST 38 27 25 36 16  ALT 28 23 21 24 16   ALKPHOS 713* 794* 664* 765* 537*  BILITOT 1.0 1.1 1.1 1.2 0.8  PROT 6.2* 6.3* 5.9* 6.1* 5.3*  ALBUMIN  1.9* 1.9* 1.8* 1.8* 2.0*   No results for input(s): LIPASE, AMYLASE in the last 168 hours. No results for input(s): AMMONIA in the last 168 hours. Coagulation profile Recent Labs  Lab 04/18/23 1135  INR 2.1*    CBC: Recent Labs  Lab 04/15/23 1442 04/17/23 1315 04/18/23 1135 04/19/23 0500 04/20/23 0328 04/21/23 0434  WBC 17.4* 0.6* 0.6* 0.4* 1.4* 16.0*  NEUTROABS 16.7* 0.5* 0.4*  --  0.8* 13.2*  HGB 8.7* 8.6* 8.5* 7.2* 7.9* 7.8*  HCT 28.8* 28.8* 29.0*  24.4* 26.8* 26.8*  MCV 83.7 85.0 85.5 85.9 85.1 86.7  PLT 291 152 131* 118* 145* 166   Cardiac Enzymes: No results for input(s): CKTOTAL, CKMB, CKMBINDEX, TROPONINI in the last 168 hours. BNP (last 3 results) No results for input(s): PROBNP in the last 8760 hours. CBG: Recent Labs  Lab 04/20/23 0755 04/20/23 1153 04/20/23 1612 04/20/23 2136 04/21/23 0809  GLUCAP 161* 155* 135* 180* 204*   D-Dimer: No results for input(s): DDIMER in the last 72 hours. Hgb A1c: No results for input(s): HGBA1C in the last 72 hours. Lipid Profile: No results for input(s): CHOL, HDL, LDLCALC, TRIG, CHOLHDL, LDLDIRECT in the last 72 hours. Thyroid function studies: No results for input(s): TSH, T4TOTAL, T3FREE, THYROIDAB in the last 72 hours.  Invalid input(s): FREET3 Anemia work up: No results for input(s): VITAMINB12, FOLATE, FERRITIN, TIBC, IRON, RETICCTPCT in the last 72 hours. Sepsis Labs: Recent Labs  Lab 04/18/23 1135 04/19/23 0500 04/20/23 0328 04/21/23 0434  WBC 0.6* 0.4* 1.4* 16.0*  LATICACIDVEN 1.8  --   --   --     Microbiology Recent Results (from the past 240 hours)  Blood Culture (routine x 2)     Status: None (Preliminary result)   Collection Time: 04/18/23 11:34 AM   Specimen: BLOOD  Result Value Ref Range Status   Specimen Description BLOOD PORTA CATH  Final   Special Requests   Final    BLOOD BOTTLES DRAWN AEROBIC AND ANAEROBIC Blood Culture adequate volume   Culture  Final    NO GROWTH 3 DAYS Performed at [facility], [address] [place], [place] [address]    Report Status PENDING  Incomplete  Blood Culture (routine x 2)     Status: None (Preliminary result)   Collection Time: 04/18/23 11:36 AM   Specimen: BLOOD  Result Value Ref Range Status   Specimen Description BLOOD RIGHT ANTECUBITAL  Final   Special Requests   Final    BLOOD BOTTLES DRAWN AEROBIC AND ANAEROBIC Blood Culture adequate volume    Culture   Final    NO GROWTH 3 DAYS Performed at [facility], [address], [place], [place] [address]    Report Status PENDING  Incomplete  Resp panel by RT-PCR (RSV, Flu A&B, Covid) Anterior Nasal Swab     Status: None   Collection Time: 04/18/23 12:30 PM   Specimen: Anterior Nasal Swab  Result Value Ref Range Status   SARS Coronavirus 2 by RT PCR NEGATIVE NEGATIVE Final    Comment: (NOTE) SARS-CoV-2 target nucleic acids are NOT DETECTED.  The SARS-CoV-2 RNA is generally detectable in upper respiratory specimens during the acute phase of infection. The lowest concentration of SARS-CoV-2 viral copies this assay can detect is 138 copies/mL. A negative result does not preclude SARS-Cov-2 infection and should not be used as the sole basis for treatment or other patient management decisions. A negative result may occur with  improper specimen collection/handling, submission of specimen other than nasopharyngeal swab, presence of viral mutation(s) within the areas targeted by this assay, and inadequate number of viral copies(<138 copies/mL). A negative result must be combined with clinical observations, patient history, and epidemiological information. The expected result is Negative.  Fact Sheet for Patients:  bloggercourse.com  Fact Sheet for Healthcare Providers:  seriousbroker.it  This test is no t yet approved or cleared by the [place]  FDA and  has been authorized for detection and/or diagnosis of SARS-CoV-2 by FDA under an Emergency Use Authorization (EUA). This EUA will remain  in effect (meaning this test can be used) for the duration of the COVID-19 declaration under Section 564(b)(1) of the Act, 21 U.S.C.section 360bbb-3(b)(1), unless the authorization is terminated  or revoked sooner.       Influenza A by PCR NEGATIVE NEGATIVE Final   Influenza B by PCR NEGATIVE NEGATIVE Final    Comment:  (NOTE) The Xpert Xpress SARS-CoV-2/FLU/RSV plus assay is intended as an aid in the diagnosis of influenza from Nasopharyngeal swab specimens and should not be used as a sole basis for treatment. Nasal washings and aspirates are unacceptable for Xpert Xpress SARS-CoV-2/FLU/RSV testing.  Fact Sheet for Patients: bloggercourse.com  Fact Sheet for Healthcare Providers: seriousbroker.it  This test is not yet approved or cleared by the [place]  FDA and has been authorized for detection and/or diagnosis of SARS-CoV-2 by FDA under an Emergency Use Authorization (EUA). This EUA will remain in effect (meaning this test can be used) for the duration of the COVID-19 declaration under Section 564(b)(1) of the Act, 21 U.S.C. section 360bbb-3(b)(1), unless the authorization is terminated or revoked.     Resp Syncytial Virus by PCR NEGATIVE NEGATIVE Final    Comment: (NOTE) Fact Sheet for Patients: bloggercourse.com  Fact Sheet for Healthcare Providers: seriousbroker.it  This test is not yet approved or cleared by the [place]  FDA and has been authorized for detection and/or diagnosis of SARS-CoV-2 by FDA under an Emergency Use Authorization (EUA). This EUA will remain in effect (meaning this test can  be used) for the duration of the COVID-19 declaration under Section 564(b)(1) of the Act, 21 U.S.C. section 360bbb-3(b)(1), unless the authorization is terminated or revoked.  Performed at [facility], [address] [place], [place] [address]   MRSA Next Gen by PCR, Nasal     Status: None   Collection Time: 04/18/23  5:46 PM   Specimen: Nasal Mucosa; Nasal Swab  Result Value Ref Range Status   MRSA by PCR Next Gen NOT DETECTED NOT DETECTED Final    Comment: (NOTE) The GeneXpert MRSA Assay (FDA approved for NASAL specimens only), is one component of a comprehensive MRSA  colonization surveillance program. It is not intended to diagnose MRSA infection nor to guide or monitor treatment for MRSA infections. Test performance is not FDA approved in patients less than 33 years old. Performed at [facility], [address] [place], [place] [address]     Procedures and diagnostic studies:  DG Chest [facility] 1 View Result Date: 04/20/2023 CLINICAL DATA:  63 year old male with history of acute respiratory failure. EXAM: PORTABLE CHEST 1 VIEW COMPARISON:  Chest x-ray 04/29/2023. FINDINGS: A feeding tube is seen extending into the abdomen, however, the tip of the feeding tube extends below the lower margin of the image. Right internal jugular single-lumen Port-A-Cath with tip terminating at the superior cavoatrial junction. Substantially worsened aeration throughout the right lung which is now nearly completely opacified, indicative of progressive atelectasis and/or consolidation, also with superimposed large right pleural effusion. Patchy areas of interstitial prominence an ill-defined opacities scattered throughout the left lung, concerning for bronchopneumonia. No left pleural effusion. No pneumothorax. No evidence of pulmonary edema. Heart size appears normal. Mediastinal contours are distorted by patient positioning and rotation to the right. IMPRESSION: 1. Enlarging large right pleural effusion with increasing atelectasis and/or consolidation throughout nearly the entire right lung, substantially worsened compared with yesterday's examination. 2. Probable left-sided bronchopneumonia. Electronically Signed   By: Toribio Aye M.D.   On: 04/20/2023 08:17   DG Chest Port 1 View Result Date: 04/19/2023 CLINICAL DATA:  Feeding tube placement EXAM: PORTABLE CHEST 1 VIEW COMPARISON:  X-ray 04/17/2023 and older.  CTA 04/18/2023. FINDINGS: Feeding tube in place with tip extending into the left upper quadrant, likely along the midbody of the stomach. Separate right IJ  catheter with the tip overlying the SVC right atrial junction. Underinflation with persistent right-sided pleural effusion and adjacent opacity. Patchy parenchymal opacities as well as left mid to lower lung, more pronounced in the prior x-ray but today's x-rays also less well inflated. No pneumothorax. No left effusion. Stable cardiopericardial silhouette. IMPRESSION: Feeding tube with the tip overlying the midbody of the stomach. Increasing parenchymal opacities in the left lung base. Persistent patchy opacities in the right lung with a right-sided pleural effusion. Chest port Recommend follow up Electronically Signed   By: Ranell Bring M.D.   On: 04/19/2023 14:17               LOS: 3 days   Zaydyn Havey  [facility]   Pager on [email or web address]. If 7PM-7AM, please contact night-coverage at [email or web address]     04/21/2023, 10:51 AM

## 2023-04-21 NOTE — Plan of Care (Signed)
  Problem: Fluid Volume: Goal: Ability to maintain a balanced intake and output will improve Outcome: Progressing   Problem: Metabolic: Goal: Ability to maintain appropriate glucose levels will improve Outcome: Progressing   Problem: Tissue Perfusion: Goal: Adequacy of tissue perfusion will improve Outcome: Progressing   Problem: Education: Goal: Knowledge of General Education information will improve Description: Including pain rating scale, medication(s)/side effects and non-pharmacologic comfort measures Outcome: Progressing   Problem: Clinical Measurements: Goal: Respiratory complications will improve Outcome: Progressing Goal: Cardiovascular complication will be avoided Outcome: Progressing   Problem: Coping: Goal: Level of anxiety will decrease Outcome: Progressing   Problem: Elimination: Goal: Will not experience complications related to urinary retention Outcome: Progressing   Problem: Pain Management: Goal: General experience of comfort will improve Outcome: Progressing   Problem: Safety: Goal: Ability to remain free from injury will improve Outcome: Progressing

## 2023-04-21 NOTE — Progress Notes (Signed)
 PHARMACY CONSULT NOTE - FOLLOW UP  Pharmacy Consult for Electrolyte Monitoring and Replacement   Recent Labs: Potassium (mmol/L)  Date Value  04/21/2023 4.1   Magnesium  (mg/dL)  Date Value  98/93/7974 1.9   Calcium  (mg/dL)  Date Value  98/93/7974 9.9   Albumin  (g/dL)  Date Value  98/95/7974 2.0 (L)   Phosphorus (mg/dL)  Date Value  98/93/7974 2.0 (L)   Sodium (mmol/L)  Date Value  04/21/2023 149 (H)    Assessment: 63 y.o. male with medical history significant of metastatic pancreatic cancer on palliative chemotherapy, asthma, prostate cancer s/p prostatectomy (2023), IDDM, HTN, HLD, PE on Eliquis , chronic pain, diverticulosis, asthma, OSA, anxiety and dysphagia with silent aspiration (followed by o/p SLP) who is admitted with aspiration PNA and sepsis. Pharmacy is asked to follow and replace electrolytes  Goal of Therapy:  Electrolytes WNL  Plan:  ---phosphorous 500 pg per tube x 1 (contains phosphorus 16 mMol, potassium 2.2 mEq) ---recheck electrolytes in am  Tony Benson ,PharmD Clinical Pharmacist 04/21/2023 2:33 PM

## 2023-04-21 NOTE — Progress Notes (Addendum)
 Nutrition Follow-up  DOCUMENTATION CODES:   Severe malnutrition in context of chronic illness  INTERVENTION:   -TF via NGT:   Continue Osmolite 1.5 @ 55 ml/hr and increase by 10 ml every 8 hours to goal rate of 65 ml/hr.   60 ml Prosource TF daily  200 ml free water  flush every 4 hours  Tube feeding regimen provides 2420 kcal (100% of needs), 118 grams of protein, and 1189 ml of H2O. Total free water : 2389 ml daily  -Continue 1 packet Juven BID via tube, each packet provides 95 calories, 2.5 grams of protein (collagen), and 9.8 grams of carbohydrate (3 grams sugar); also contains 7 grams of L-arginine and L-glutamine, 300 mg vitamin C, 15 mg vitamin E , 1.2 mcg vitamin B-12, 9.5 mg zinc, 200 mg calcium , and 1.5 g  Calcium  Beta-hydroxy-Beta-methylbutyrate to support wound healing   -Continue 100 mg thiamine  daily -MVI with minerals daily via tube -Monitor Mg, K, and Phos and replete as needed secondary to high refeeding risk -Case discussed with MD; pharmacy consult for electrolyte management  NUTRITION DIAGNOSIS:   Severe Malnutrition related to chronic illness (metastatic pancreatic cancer) as evidenced by moderate fat depletion, severe fat depletion, moderate muscle depletion, severe muscle depletion.  Ongoing  GOAL:   Patient will meet greater than or equal to 90% of their needs  Progressing   MONITOR:   Supplement acceptance, TF tolerance  REASON FOR ASSESSMENT:   Consult Enteral/tube feeding initiation and management  ASSESSMENT:   63 y.o. male with medical history significant of metastatic pancreatic cancer on palliative chemotherapy, asthma, prostate cancer s/p prostatectomy (2023), IDDM, HTN, HLD, PE on Eliquis , chronic pain, diverticulosis, asthma, OSA, anxiety and dysphagia with silent aspiration (followed by o/p SLP) who is admitted with  aspiration PNA and sepsis  12/19- s/p BSE- dysphagia 3 diet with thin liquids 12/20- s/p MBSS- recommend NPO 1/4- NGT  placed (tip of tube in stomach per KUB on 04/19/23), TF initiated  Reviewed I/O's: +595 ml x 24 hours and +3.7 L since admission  UOP: 610 ml x 24 hours  Per MD notes, pt with poor prognosis.   Pt lying in bed at time of visit. He tracked this RD with his eyes, but unable to converse. Spoke with pt daughter at bedside. She reports decreased oral intake since last admission. Pt was not following any particular diet PTA. however, she reports intake was extremely poor as pt would cough with all oral intake. Noted pt discharged from hospital on 04/05/23 with plans to continue dysphasia diet and accept aspiration precautions.   Pt daughter endorses wt loss, but unsure how much. Reviewed wt hx; pt has experienced a 3.2% wt loss over the past months, which is not significant for time frame. Noted pt with moderate edema on exam, which may be masking true weight loss as well as further fat and muscle depletions.   RD discussed NPO status and how pt will receive nutrition via NGT. Pt tolerating TF well- discussed slow titration of feeds secondary to refeeding risk. Osmolite 1.5 currently infusing at 55 ml/hr.   Palliative care following for goals of care discussions; desires full care at this time.   Medications reviewed and include thiamine .   Labs reviewed: Mg and K WDL. Phos: 2.0, CBGS: 135-204 (inpatient orders for glycemic control are 0-15 units insulin  aspart TID with meals, and 0-5 units insulin  aspart daily at bedtime).    NUTRITION - FOCUSED PHYSICAL EXAM:  Flowsheet Row Most Recent Value  Orbital Region  Severe depletion  Upper Arm Region Severe depletion  Thoracic and Lumbar Region Severe depletion  Buccal Region Severe depletion  Temple Region Severe depletion  Clavicle Bone Region Severe depletion  Clavicle and Acromion Bone Region Severe depletion  Scapular Bone Region Severe depletion  Dorsal Hand Severe depletion  Patellar Region Moderate depletion  Anterior Thigh Region Moderate  depletion  Posterior Calf Region Moderate depletion  Edema (RD Assessment) Moderate  Hair Reviewed  Eyes Reviewed  Mouth Reviewed  Skin Reviewed  Nails Reviewed       Diet Order:   Diet Order             Diet NPO time specified Except for: Sips with Meds  Diet effective now                   EDUCATION NEEDS:   Education needs have been addressed  Skin:  Skin Assessment: Skin Integrity Issues: Skin Integrity Issues:: Other (Comment), Stage II Stage II: coccyx Other: non-pressure wound to scrotum, laceration to lt pretibial  Last BM:  04/18/23  Height:   Ht Readings from Last 1 Encounters:  04/18/23 5' 10 (1.778 m)    Weight:   Wt Readings from Last 1 Encounters:  04/21/23 65.8 kg    Ideal Body Weight:  75.45 kg  BMI:  Body mass index is 20.81 kg/m.  Estimated Nutritional Needs:   Kcal:  2100-2400kcal/day  Protein:  105-120g/day  Fluid:  2.0-2.3L/day    Margery ORN, RD, LDN, CDCES Registered Dietitian III Certified Diabetes Care and Education Specialist If unable to reach this RD, please use RD Inpatient group chat on secure chat between hours of 8am-4 pm daily

## 2023-04-21 NOTE — Progress Notes (Signed)
 Dr. Myriam Forehand at bedside and notified that patient's HR continues to be tachycardic in the 110s-120s. Notified that PRN metoprolol not given due to SBP 102. No further orders at this time.

## 2023-04-21 NOTE — Plan of Care (Signed)
  Problem: Fluid Volume: Goal: Ability to maintain a balanced intake and output will improve Outcome: Progressing   Problem: Metabolic: Goal: Ability to maintain appropriate glucose levels will improve Outcome: Progressing   Problem: Clinical Measurements: Goal: Diagnostic test results will improve Outcome: Progressing Goal: Respiratory complications will improve Outcome: Progressing   Problem: Nutrition: Goal: Adequate nutrition will be maintained Outcome: Progressing   Problem: Coping: Goal: Level of anxiety will decrease Outcome: Progressing

## 2023-04-21 NOTE — Plan of Care (Signed)
  Problem: Metabolic: Goal: Ability to maintain appropriate glucose levels will improve Outcome: Progressing   Problem: Clinical Measurements: Goal: Respiratory complications will improve Outcome: Progressing Goal: Cardiovascular complication will be avoided Outcome: Progressing   Problem: Elimination: Goal: Will not experience complications related to urinary retention Outcome: Progressing   Problem: Pain Management: Goal: General experience of comfort will improve Outcome: Progressing

## 2023-04-21 NOTE — Procedures (Signed)
 IR asked to evaluate patient for possible right-sided thoracentesis. Limited ultrasound evaluation of the right lung revealed consolidated lung tissue with small scattered pockets of fluid. No drainable collections to target.   No procedure performed. Images saved in Epic.  Nyoka Alcoser, AGACNP-BC 04/21/2023, 4:36 PM

## 2023-04-22 ENCOUNTER — Ambulatory Visit: Payer: BC Managed Care – PPO | Admitting: Internal Medicine

## 2023-04-22 ENCOUNTER — Other Ambulatory Visit: Payer: Self-pay | Admitting: Internal Medicine

## 2023-04-22 ENCOUNTER — Ambulatory Visit: Payer: BC Managed Care – PPO

## 2023-04-22 ENCOUNTER — Other Ambulatory Visit: Payer: BC Managed Care – PPO

## 2023-04-22 ENCOUNTER — Telehealth: Payer: Self-pay | Admitting: *Deleted

## 2023-04-22 DIAGNOSIS — Z515 Encounter for palliative care: Secondary | ICD-10-CM

## 2023-04-22 DIAGNOSIS — G9341 Metabolic encephalopathy: Secondary | ICD-10-CM | POA: Diagnosis not present

## 2023-04-22 DIAGNOSIS — E870 Hyperosmolality and hypernatremia: Secondary | ICD-10-CM | POA: Diagnosis not present

## 2023-04-22 DIAGNOSIS — A419 Sepsis, unspecified organism: Secondary | ICD-10-CM | POA: Diagnosis not present

## 2023-04-22 LAB — CBC WITH DIFFERENTIAL/PLATELET
Abs Immature Granulocytes: 1.9 10*3/uL — ABNORMAL HIGH (ref 0.00–0.07)
Basophils Absolute: 0 10*3/uL (ref 0.0–0.1)
Basophils Relative: 0 %
Eosinophils Absolute: 0.5 10*3/uL (ref 0.0–0.5)
Eosinophils Relative: 2 %
HCT: 27.3 % — ABNORMAL LOW (ref 39.0–52.0)
Hemoglobin: 7.7 g/dL — ABNORMAL LOW (ref 13.0–17.0)
Immature Granulocytes: 7 %
Lymphocytes Relative: 2 %
Lymphs Abs: 0.5 10*3/uL — ABNORMAL LOW (ref 0.7–4.0)
MCH: 24.9 pg — ABNORMAL LOW (ref 26.0–34.0)
MCHC: 28.2 g/dL — ABNORMAL LOW (ref 30.0–36.0)
MCV: 88.3 fL (ref 80.0–100.0)
Monocytes Absolute: 2.4 10*3/uL — ABNORMAL HIGH (ref 0.1–1.0)
Monocytes Relative: 8 %
Neutro Abs: 24.1 10*3/uL — ABNORMAL HIGH (ref 1.7–7.7)
Neutrophils Relative %: 81 %
Platelets: 182 10*3/uL (ref 150–400)
RBC: 3.09 MIL/uL — ABNORMAL LOW (ref 4.22–5.81)
RDW: 18.9 % — ABNORMAL HIGH (ref 11.5–15.5)
Smear Review: NORMAL
WBC: 29.3 10*3/uL — ABNORMAL HIGH (ref 4.0–10.5)
nRBC: 1.2 % — ABNORMAL HIGH (ref 0.0–0.2)

## 2023-04-22 LAB — BASIC METABOLIC PANEL
Anion gap: 6 (ref 5–15)
BUN: 40 mg/dL — ABNORMAL HIGH (ref 8–23)
CO2: 23 mmol/L (ref 22–32)
Calcium: 10 mg/dL (ref 8.9–10.3)
Chloride: 121 mmol/L — ABNORMAL HIGH (ref 98–111)
Creatinine, Ser: 0.73 mg/dL (ref 0.61–1.24)
GFR, Estimated: >60 mL/min (ref >60)
Glucose, Bld: 304 mg/dL — ABNORMAL HIGH (ref 70–99)
Potassium: 4.3 mmol/L (ref 3.5–5.1)
Sodium: 150 mmol/L — ABNORMAL HIGH (ref 135–145)

## 2023-04-22 LAB — MAGNESIUM: Magnesium: 2.1 mg/dL (ref 1.7–2.4)

## 2023-04-22 LAB — GLUCOSE, CAPILLARY
Glucose-Capillary: 297 mg/dL — ABNORMAL HIGH (ref 70–99)
Glucose-Capillary: 291 mg/dL — ABNORMAL HIGH (ref 70–99)

## 2023-04-22 LAB — PHOSPHORUS: Phosphorus: 1.5 mg/dL — ABNORMAL LOW (ref 2.5–4.6)

## 2023-04-22 MED ORDER — LORAZEPAM 2 MG/ML IJ SOLN
0.5000 mg | INTRAMUSCULAR | Status: DC | PRN
  Administered 2023-04-22: 0.5 mg via INTRAVENOUS
  Filled 2023-04-22: qty 1

## 2023-04-22 MED ORDER — GLYCOPYRROLATE 0.2 MG/ML IJ SOLN
0.4000 mg | INTRAMUSCULAR | Status: DC | PRN
  Administered 2023-04-22: 0.4 mg via INTRAVENOUS
  Filled 2023-04-22: qty 2

## 2023-04-22 MED ORDER — LORAZEPAM 2 MG/ML IJ SOLN
1.0000 mg | INTRAMUSCULAR | Status: DC | PRN
  Administered 2023-04-22: 1 mg via INTRAVENOUS
  Filled 2023-04-22: qty 1

## 2023-04-22 MED ORDER — HYDROMORPHONE HCL 1 MG/ML IJ SOLN
0.5000 mg | INTRAMUSCULAR | Status: DC | PRN
  Administered 2023-04-22: 0.5 mg via INTRAVENOUS
  Filled 2023-04-22: qty 1

## 2023-04-22 MED ORDER — K PHOS MONO-SOD PHOS DI & MONO 155-852-130 MG PO TABS
500.0000 mg | ORAL_TABLET | Freq: Three times a day (TID) | ORAL | Status: DC
  Administered 2023-04-22: 500 mg
  Filled 2023-04-22 (×2): qty 2

## 2023-04-22 MED ORDER — INSULIN GLARGINE-YFGN 100 UNIT/ML ~~LOC~~ SOLN
20.0000 [IU] | Freq: Once | SUBCUTANEOUS | Status: AC
  Administered 2023-04-22: 20 [IU] via SUBCUTANEOUS
  Filled 2023-04-22: qty 0.2

## 2023-04-22 NOTE — Telephone Encounter (Signed)
 FMLA for [name] received and completed and sent for doctor signature

## 2023-04-22 NOTE — Consult Note (Addendum)
 Palliative Medicine [facility] at [facility] [phone] [phone]   Name: Tony Benson Date: 04/22/2023 MRN: [id number]  DOB: [birth year]  Patient Care Team: Weyman Bright, MD as PCP - General (Internal Medicine) End, Lonni, MD as PCP - Cardiology (Cardiology) Gladis Cough, MD as Consulting Physician (General Surgery) Rennie Cindy SAUNDERS, MD as Consulting Physician (Oncology) Maurie Rayfield BIRCH, RN as Oncology Nurse Navigator    REASON FOR CONSULTATION: Tony Benson is a 63 y.o. male with multiple medical problems including OSA on CPAP, diabetes.  Patient was hospitalized November 2024 with shortness of breath/acute PE, bilateral DVT.  CT of the abdomen and pelvis found to have a pancreatic head mass and liver masses.  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  Patient is status post 3 cycles of gemcitabine  and Abraxane .  He was subsequently hospitalized 03/31/2023 to 04/05/2023 with aspiration pneumonia.  Patient is now readmitted 04/18/2023 with same.  Palliative care was consulted to address goals and manage ongoing symptoms. .   SOCIAL HISTORY:     reports that he has never smoked. He has never used smokeless tobacco. He reports that he does not drink alcohol and does not use drugs.  Patient is married lives at home with his wife.  They have a daughter in [place] who is a engineer, civil (consulting).  Patient worked in [employer].  ADVANCE DIRECTIVES:  Not on file  CODE STATUS: Full code  PAST MEDICAL HISTORY: Past Medical History:  Diagnosis Date   Asthma    Cancer (HCC)    prostate   Diabetes mellitus without complication (HCC)    type II   Hyperlipidemia    Hypertension    Sleep apnea    uses Cpap machine     PAST SURGICAL HISTORY:  Past Surgical History:  Procedure Laterality Date   CHOLECYSTECTOMY     COLONOSCOPY WITH PROPOFOL  N/A 11/18/2018    Procedure: COLONOSCOPY WITH PROPOFOL ;  Surgeon: Janalyn Keene NOVAK, MD;  Location: [facility] ENDOSCOPY;  Service: Gastroenterology;  Laterality: N/A;   COLONOSCOPY WITH PROPOFOL  N/A 02/05/2023   Procedure: COLONOSCOPY WITH PROPOFOL ;  Surgeon: Unk Corinn Skiff, MD;  Location: [facility] ENDOSCOPY;  Service: Gastroenterology;  Laterality: N/A;   IR IMAGING GUIDED PORT INSERTION  03/24/2023   IR US  LIVER BIOPSY  03/03/2023   LAPAROSCOPIC RETROPUBIC PROSTATECTOMY     2023   LUNG SURGERY     ligation of thoracic duct   PROSTATE BIOPSY N/A 09/21/2020   Procedure: PROSTATE BIOPSY GRAYCE;  Surgeon: Kassie Ozell SAUNDERS, MD;  Location: [facility] ORS;  Service: Urology;  Laterality: N/A;    HEMATOLOGY/ONCOLOGY HISTORY:  Oncology History Overview Note   # NOV 2024- Liver, needle/core biopsy,  :      - METASTATIC CARCINOMA      - SEE NOTE       Diagnosis Note : The patient's clinical history of prostate cancer, recent      pleural effusion and MRI findings significant for a large head of pancreas mass      and multiple liver masses are noted.  Immunohistochemistry is performed on block      1B.  The tumor cells are diffusely positive for CK7 while negative for CK20,      CDX-2, TTF-1, and NKX3.1 (prostate marker).  The immunohistochemical staining      pattern is not specific and may be seen in cancers of the upper GI tract,  pancreaticobiliary system, lung (less likely due to TTF-1 negativity) and      breast.  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  Clinical and radiological correlation recommended.  Dr. Jordan      reviewed the case and agrees with the above diagnosis.   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  MEDICATIONS:  Current Facility-Administered Medications  Medication Dose Route Frequency Provider Last Rate Last Admin   acetaminophen  (TYLENOL ) suppository 650 mg  650 mg Rectal Q4H PRN Jens Durand, MD   650 mg at 04/18/23 1729   acetaminophen  (TYLENOL ) tablet 500 mg  500 mg Oral Q6H PRN Jens Durand, MD       acetylcysteine  (MUCOMYST ) 20 % nebulizer / oral solution 4 mL  4 mL Nebulization BID Aleskerov, Fuad, MD   4 mL at 04/22/23 9270   ALPRAZolam  (XANAX ) tablet 0.5 mg  0.5 mg Oral BID PRN Jens Durand, MD   0.5 mg at 04/22/23 0134   ceFEPIme  (MAXIPIME ) 2 g in sodium chloride  0.9 % 100 mL IVPB  2 g Intravenous Q8H Jens Durand, MD   Paused at 04/22/23 9161   Chlorhexidine  Gluconate Cloth 2 % PADS 6 each  6 each Topical Daily Jens Durand, MD   6 each at 04/22/23 0854   feeding supplement (OSMOLITE 1.5 CAL) liquid 1,000 mL  1,000 mL Per Tube Continuous Ayiku, Bernard, MD 65 mL/hr at 04/22/23 1000 Infusion Verify at 04/22/23 1000   feeding supplement (PROSource TF20) liquid 60 mL  60 mL Per Tube Daily Jens Durand, MD   60 mL at 04/22/23 0946   fentaNYL  (DURAGESIC ) 12 MCG/HR 1 patch  1 patch Transdermal Q72H Ayiku, Bernard, MD   1 patch at 04/21/23 1752   free water  200 mL  200 mL Per Tube Q4H Jens Durand, MD   200 mL at 04/22/23 0809   glycopyrrolate  (ROBINUL ) injection 0.2 mg  0.2 mg Intravenous Q4H PRN Nazari, Walid A, RPH   0.2 mg at 04/22/23 0044   heparin  injection 5,000 Units  5,000 Units Subcutaneous Q8H Jens Durand, MD   5,000 Units at 04/22/23 0516   HYDROmorphone  (DILAUDID ) injection 0.5 mg  0.5 mg  Intravenous Q4H PRN Jens Durand, MD   0.5 mg at 04/22/23 9196   insulin  aspart (novoLOG ) injection 0-15 Units  0-15 Units Subcutaneous TID WC Jens Durand, MD   8 Units at 04/22/23 0804   insulin  aspart (novoLOG ) injection 0-5 Units  0-5 Units Subcutaneous QHS Jens Durand, MD       ipratropium-albuterol  (DUONEB) 0.5-2.5 (3) MG/3ML nebulizer solution 3 mL  3 mL Nebulization QID Parris Manna, MD   3 mL at 04/22/23 0729   metoprolol  tartrate (LOPRESSOR ) injection 5 mg  5 mg Intravenous Q4H PRN Jens Durand, MD   5 mg at 04/20/23 1225   metroNIDAZOLE  (FLAGYL )  IVPB 500 mg  500 mg Intravenous Q12H Jens Durand, MD 100 mL/hr at 04/22/23 1000 Infusion Verify at 04/22/23 1000   mometasone -formoterol  (DULERA ) 100-5 MCG/ACT inhaler 2 puff  2 puff Inhalation BID Jens Durand, MD   2 puff at 04/22/23 0753   multivitamin with minerals tablet 1 tablet  1 tablet Per Tube Daily Jens Durand, MD   1 tablet at 04/22/23 0945   nutrition supplement (JUVEN) (JUVEN) powder packet 1 packet  1 packet Per Tube BID BM Ayiku, Bernard, MD   1 packet at 04/22/23 0945   OLANZapine  zydis (ZYPREXA ) disintegrating tablet 5 mg  5 mg Oral QHS Jesus America, NP   5 mg at 04/21/23 2126   ondansetron  (ZOFRAN ) injection 4 mg  4 mg Intravenous Q6H PRN Jens Durand, MD   4 mg at 04/20/23 1008   Oral care mouth rinse  15 mL Mouth Rinse 4 times per day Jens Durand, MD   15 mL at 04/22/23 9191   Oral care mouth rinse  15 mL Mouth Rinse PRN Jens Durand, MD       oxyCODONE  (Oxy IR/ROXICODONE ) immediate release tablet 5 mg  5 mg Per NG tube Q8H PRN Jens Durand, MD   5 mg at 04/22/23 0133   phosphorus (K PHOS  NEUTRAL) tablet 500 mg  500 mg Per Tube TID Nada Adriana BIRCH, RPH   500 mg at 04/22/23 9195   polyethylene glycol (MIRALAX  / GLYCOLAX ) packet 17 g  17 g Per NG tube Daily PRN Jens Durand, MD   17 g at 04/21/23 1307   scopolamine  (TRANSDERM-SCOP) 1 MG/3DAYS 1.5 mg  1 patch Transdermal Q72H Jens Durand, MD    1.5 mg at 04/20/23 0945   senna-docusate (Senokot-S) tablet 1 tablet  1 tablet Per NG tube QHS PRN Jens Durand, MD       thiamine  (VITAMIN B1) tablet 100 mg  100 mg Per Tube Daily Jens Durand, MD   100 mg at 04/22/23 0804    VITAL SIGNS: BP 113/86   Pulse (!) 121   Temp 97.8 F (36.6 C) (Axillary)   Resp (!) 24   Ht 5' 10 (1.778 m)   Wt 142 lb 10.2 oz (64.7 kg)   SpO2 95%   BMI 20.47 kg/m  Filed Weights   04/20/23 0350 04/21/23 0500 04/22/23 0427  Weight: 142 lb 3.2 oz (64.5 kg) 145 lb 1 oz (65.8 kg) 142 lb 10.2 oz (64.7 kg)    Estimated body mass index is 20.47 kg/m as calculated from the following:   Height as of this encounter: 5' 10 (1.778 m).   Weight as of this encounter: 142 lb 10.2 oz (64.7 kg).  LABS: CBC:    Component Value Date/Time   WBC 29.3 (H) 04/22/2023 0400   HGB 7.7 (L) 04/22/2023 0400   HGB 8.6 (L) 04/17/2023 1315   HCT 27.3 (L) 04/22/2023 0400   PLT 182 04/22/2023 0400   PLT 152 04/17/2023 1315   MCV 88.3 04/22/2023 0400   NEUTROABS 24.1 (H) 04/22/2023 0400   LYMPHSABS 0.5 (L) 04/22/2023 0400   MONOABS 2.4 (H) 04/22/2023 0400   EOSABS 0.5 04/22/2023 0400   BASOSABS 0.0 04/22/2023 0400   Comprehensive Metabolic Panel:    Component Value Date/Time   NA 150 (H) 04/22/2023 0400   K 4.3 04/22/2023 0400   CL 121 (H) 04/22/2023 0400   CO2 23 04/22/2023 0400   BUN 40 (H) 04/22/2023 0400   CREATININE 0.73 04/22/2023 0400   CREATININE  0.89 04/18/2023 0926   GLUCOSE 304 (H) 04/22/2023 0400   CALCIUM  10.0 04/22/2023 0400   AST 16 04/19/2023 0500   AST 25 04/18/2023 0926   ALT 16 04/19/2023 0500   ALT 21 04/18/2023 0926   ALKPHOS 537 (H) 04/19/2023 0500   BILITOT 0.8 04/19/2023 0500   BILITOT 1.1 04/18/2023 0926   PROT 5.3 (L) 04/19/2023 0500   ALBUMIN  2.0 (L) 04/19/2023 0500    RADIOGRAPHIC STUDIES: US  CHEST (PLEURAL EFFUSION) Result Date: 04/21/2023 CLINICAL DATA:  Interventional Radiology asked to assess the patient for possible  right-sided thoracentesis. EXAM: CHEST ULTRASOUND COMPARISON:  DG chest April 20, 2023 FINDINGS: Limited ultrasound evaluation of the right lung revealed consolidated lung tissue with scattered areas of fluid. No drainable collections to target. No procedure performed. IMPRESSION: Limited ultrasound evaluation of the right lung revealed scattered areas of fluid with no drainable collections to target. No procedure performed in the image findings were discussed with the patient's family. Ultrasound images reviewed by Warren Dais NP. Electronically Signed   By: Marcey Moan M.D.   On: 04/21/2023 17:20   ECHOCARDIOGRAM LIMITED Result Date: 04/21/2023    ECHOCARDIOGRAM LIMITED REPORT   Patient Name:   Tony Benson Date of Exam: 04/21/2023 Medical Rec #:  [id number]     Height:       70.0 in Accession #:    [id number]    Weight:       145.1 lb Date of Birth:  06/26/1960    BSA:          1.821 m Patient Age:    62 years      BP:           113/63 mmHg Patient Gender: M             HR:           127 bpm. Exam Location:  [facility] Procedure: Limited Echo, Cardiac Doppler and Color Doppler Indications:     Pericardial Effusion I31.3  History:         Patient has prior history of Echocardiogram examinations, most                  recent 04/19/2023. Risk Factors:Hypertension and Diabetes.  Sonographer:     Christopher Furnace Referring Phys:  [id number] DARRYLE NED O'NEAL Diagnosing Phys: Deatrice Cage MD IMPRESSIONS  1. Left ventricular ejection fraction, by estimation, is 55 to 60%. The left ventricle has normal function. The left ventricle has no regional wall motion abnormalities. Left ventricular diastolic parameters are consistent with Grade I diastolic dysfunction (impaired relaxation).  2. Right ventricular systolic function is normal. The right ventricular size is normal. Tricuspid regurgitation signal is inadequate for assessing PA pressure.  3. Large pericardial effusion. The pericardial effusion is circumferential. No Rv  diastolic collapse.  4. The mitral valve is normal in structure. No evidence of mitral valve regurgitation. No evidence of mitral stenosis.  5. The aortic valve is normal in structure. Aortic valve regurgitation is not visualized. No aortic stenosis is present.  6. The inferior vena cava is dilated in size with >50% respiratory variability, suggesting right atrial pressure of 8 mmHg. Comparison(s): A prior study was performed on 04/19/2023. No significant change from prior study. FINDINGS  Left Ventricle: Left ventricular ejection fraction, by estimation, is 55 to 60%. The left ventricle has normal function. The left ventricle has no regional wall motion abnormalities. The left ventricular internal cavity size was normal in size. There is  no left ventricular hypertrophy. Left ventricular diastolic parameters are consistent with Grade I diastolic dysfunction (impaired relaxation). Right Ventricle: The right ventricular size is normal. No increase in right ventricular wall thickness. Right ventricular systolic function is normal. Tricuspid regurgitation signal is inadequate for assessing PA pressure. The tricuspid regurgitant velocity is 2.23 m/s, and with an assumed right atrial pressure of 8 mmHg, the estimated right ventricular systolic pressure is 27.9 mmHg. Left Atrium: Left atrial size was normal in size. Right Atrium: Right atrial size was normal in size. Pericardium: A large pericardial effusion is present. The pericardial effusion is circumferential. Mitral Valve: The mitral valve is normal in structure. No evidence of mitral valve stenosis. Tricuspid Valve: The tricuspid valve is normal in structure. Tricuspid valve regurgitation is not demonstrated. No evidence of tricuspid stenosis. Aortic Valve: The aortic valve is normal in structure. Aortic valve regurgitation is not visualized. No aortic stenosis is present. Pulmonic Valve: The pulmonic valve was normal in structure. Pulmonic valve regurgitation is not  visualized. No evidence of pulmonic stenosis. Aorta: The aortic root is normal in size and structure. Venous: The inferior vena cava is dilated in size with greater than 50% respiratory variability, suggesting right atrial pressure of 8 mmHg. IAS/Shunts: No atrial level shunt detected by color flow Doppler. Additional Comments: Spectral Doppler performed. Color Doppler performed.  LEFT VENTRICLE PLAX 2D LVIDd:         4.40 cm Diastology LVIDs:         2.80 cm LV e' medial:    9.36 cm/s LV PW:         1.20 cm LV E/e' medial:  7.7 LV IVS:        1.10 cm LV e' lateral:   20.50 cm/s                        LV E/e' lateral: 3.5  RIGHT VENTRICLE RV Basal diam:  2.40 cm RV Mid diam:    2.20 cm RV S prime:     20.00 cm/s TAPSE (M-mode): 1.7 cm LEFT ATRIUM         Index       RIGHT ATRIUM          Index LA diam:    3.50 cm 1.92 cm/m  RA Area:     7.69 cm                                 RA Volume:   10.80 ml 5.93 ml/m  MITRAL VALVE               TRICUSPID VALVE MV Area (PHT): 9.03 cm    TR Peak grad:   19.9 mmHg MV Decel Time: 84 msec     TR Vmax:        223.00 cm/s MV E velocity: 71.80 cm/s MV A velocity: 87.50 cm/s MV E/A ratio:  0.82 Deatrice Cage MD Electronically signed by Deatrice Cage MD Signature Date/Time: 04/21/2023/3:22:19 PM    Final    DG Chest Port 1 View Result Date: 04/20/2023 CLINICAL DATA:  63 year old male with history of acute respiratory failure. EXAM: PORTABLE CHEST 1 VIEW COMPARISON:  Chest x-ray 04/29/2023. FINDINGS: A feeding tube is seen extending into the abdomen, however, the tip of the feeding tube extends below the lower margin of the image. Right internal jugular single-lumen Port-A-Cath with tip terminating at the superior cavoatrial junction.  Substantially worsened aeration throughout the right lung which is now nearly completely opacified, indicative of progressive atelectasis and/or consolidation, also with superimposed large right pleural effusion. Patchy areas of interstitial  prominence an ill-defined opacities scattered throughout the left lung, concerning for bronchopneumonia. No left pleural effusion. No pneumothorax. No evidence of pulmonary edema. Heart size appears normal. Mediastinal contours are distorted by patient positioning and rotation to the right. IMPRESSION: 1. Enlarging large right pleural effusion with increasing atelectasis and/or consolidation throughout nearly the entire right lung, substantially worsened compared with yesterday's examination. 2. Probable left-sided bronchopneumonia. Electronically Signed   By: Toribio Aye M.D.   On: 04/20/2023 08:17   DG Chest Port 1 View Result Date: 04/19/2023 CLINICAL DATA:  Feeding tube placement EXAM: PORTABLE CHEST 1 VIEW COMPARISON:  X-ray 04/17/2023 and older.  CTA 04/18/2023. FINDINGS: Feeding tube in place with tip extending into the left upper quadrant, likely along the midbody of the stomach. Separate right IJ catheter with the tip overlying the SVC right atrial junction. Underinflation with persistent right-sided pleural effusion and adjacent opacity. Patchy parenchymal opacities as well as left mid to lower lung, more pronounced in the prior x-ray but today's x-rays also less well inflated. No pneumothorax. No left effusion. Stable cardiopericardial silhouette. IMPRESSION: Feeding tube with the tip overlying the midbody of the stomach. Increasing parenchymal opacities in the left lung base. Persistent patchy opacities in the right lung with a right-sided pleural effusion. Chest port Recommend follow up Electronically Signed   By: Ranell Bring M.D.   On: 04/19/2023 14:17   ECHOCARDIOGRAM COMPLETE Result Date: 04/19/2023    ECHOCARDIOGRAM REPORT   Patient Name:   Tony Benson Date of Exam: 04/19/2023 Medical Rec #:  [id number]     Height:       70.0 in Accession #:    [id number]    Weight:       146.6 lb Date of Birth:  [birth year]    BSA:          1.829 m Patient Age:    62 years      BP:           104/56 mmHg  Patient Gender: M             HR:           96 bpm. Exam Location:  [facility] Procedure: 2D Echo Indications:     Pericardial effusion I31.3  History:         Patient has prior history of Echocardiogram examinations, most                  recent 04/01/2023.  Sonographer:     Thedora Louder RDCS, FASE Referring Phys:  [id number] CORT ONEIDA MANA Diagnosing Phys: Darryle Decent MD IMPRESSIONS  1. Large pericardial effusion up to 2.1 cm in the apical region of the LV. It appears similar to the prior study. There is no overt RV diastolic collapse. There is late systolic collapse of the RA, which is a nonspecific finding. There is 25% variation in MV inflow noted. The IVC is dilated. Overall, the effusion appears similar to the prior study with no overt features of tamponde, but several features suggest possible early tamponade. Clinical correlation is recommended. Large pericardial effusion. The pericardial effusion is circumferential.  2. Left ventricular ejection fraction, by estimation, is 60 to 65%. The left ventricle has normal function. The left ventricle has no regional wall motion abnormalities. There is mild concentric left ventricular hypertrophy.  Left ventricular diastolic parameters were normal.  3. Right ventricular systolic function is normal. The right ventricular size is normal. There is moderately elevated pulmonary artery systolic pressure. The estimated right ventricular systolic pressure is 45.5 mmHg.  4. The mitral valve is grossly normal. Trivial mitral valve regurgitation. No evidence of mitral stenosis.  5. The aortic valve is tricuspid. Aortic valve regurgitation is not visualized. No aortic stenosis is present.  6. The inferior vena cava is dilated in size with >50% respiratory variability, suggesting right atrial pressure of 8 mmHg. FINDINGS  Left Ventricle: Left ventricular ejection fraction, by estimation, is 60 to 65%. The left ventricle has normal function. The left ventricle has no regional wall  motion abnormalities. The left ventricular internal cavity size was normal in size. There is  mild concentric left ventricular hypertrophy. Left ventricular diastolic parameters were normal. Right Ventricle: The right ventricular size is normal. No increase in right ventricular wall thickness. Right ventricular systolic function is normal. There is moderately elevated pulmonary artery systolic pressure. The tricuspid regurgitant velocity is 2.76 m/s, and with an assumed right atrial pressure of 15 mmHg, the estimated right ventricular systolic pressure is 45.5 mmHg. Left Atrium: Left atrial size was normal in size. Right Atrium: Right atrial size was normal in size. Pericardium: Large pericardial effusion up to 2.1 cm in the apical region of the LV. It appears similar to the prior study. There is no overt RV diastolic collapse. There is late systolic collapse of the RA, which is a nonspecific finding. There is 25% variation in MV inflow noted. The IVC is dilated. Overall, the effusion appears similar to the prior study with no overt features of tamponde, but several features suggest possible early tamponade. Clinical correlation is recommended. A large pericardial  effusion is present. The pericardial effusion is circumferential. Mitral Valve: The mitral valve is grossly normal. Trivial mitral valve regurgitation. No evidence of mitral valve stenosis. Tricuspid Valve: The tricuspid valve is grossly normal. Tricuspid valve regurgitation is trivial. No evidence of tricuspid stenosis. Aortic Valve: The aortic valve is tricuspid. Aortic valve regurgitation is not visualized. No aortic stenosis is present. Aortic valve peak gradient measures 8.2 mmHg. Pulmonic Valve: The pulmonic valve was grossly normal. Pulmonic valve regurgitation is not visualized. No evidence of pulmonic stenosis. Aorta: The aortic root and ascending aorta are structurally normal, with no evidence of dilitation. Venous: The inferior vena cava is  dilated in size with greater than 50% respiratory variability, suggesting right atrial pressure of 8 mmHg. IAS/Shunts: The atrial septum is grossly normal.  LEFT VENTRICLE PLAX 2D LVIDd:         3.40 cm   Diastology LVIDs:         2.20 cm   LV e' medial:    10.70 cm/s LV PW:         1.30 cm   LV E/e' medial:  9.3 LV IVS:        1.20 cm   LV e' lateral:   17.70 cm/s LVOT diam:     2.10 cm   LV E/e' lateral: 5.6 LV SV:         83 LV SV Index:   45 LVOT Area:     3.46 cm  RIGHT VENTRICLE RV Basal diam:  3.00 cm RV S prime:     22.40 cm/s TAPSE (M-mode): 1.7 cm LEFT ATRIUM             Index        RIGHT  ATRIUM          Index LA diam:        2.80 cm 1.53 cm/m   RA Area:     8.44 cm LA Vol (A2C):   41.2 ml 22.52 ml/m  RA Volume:   16.60 ml 9.08 ml/m LA Vol (A4C):   26.9 ml 14.71 ml/m LA Biplane Vol: 34.9 ml 19.08 ml/m  AORTIC VALVE                 PULMONIC VALVE AV Area (Vmax): 2.83 cm     PV Vmax:        1.07 m/s AV Vmax:        143.00 cm/s  PV Peak grad:   4.6 mmHg AV Peak Grad:   8.2 mmHg     RVOT Peak grad: 3 mmHg LVOT Vmax:      117.00 cm/s LVOT Vmean:     78.800 cm/s LVOT VTI:       0.239 m  AORTA Ao Root diam: 3.70 cm Ao Asc diam:  3.40 cm MITRAL VALVE               TRICUSPID VALVE MV Area (PHT): 4.83 cm    TR Peak grad:   30.5 mmHg MV Decel Time: 157 msec    TR Vmax:        276.00 cm/s MV E velocity: 99.00 cm/s MV A velocity: 87.00 cm/s  SHUNTS MV E/A ratio:  1.14        Systemic VTI:  0.24 m                            Systemic Diam: 2.10 cm Darryle Decent MD Electronically signed by Darryle Decent MD Signature Date/Time: 04/19/2023/12:50:02 PM    Final    DG Chest 2 View Result Date: 04/18/2023 CLINICAL DATA:  Cough.  Wheezing.  Shortness of breath. EXAM: CHEST - 2 VIEW COMPARISON:  03/31/2023. FINDINGS: Redemonstration of right basilar opacity with associated small right pleural effusion without significant interval change. Bilateral lung fields are otherwise clear. Left lateral costophrenic angle is  clear. Stable cardio-mediastinal silhouette. No acute osseous abnormalities. The soft tissues are within normal limits. Right-sided CT Port-A-Cath is seen with its tip overlying the lower portion of superior vena cava. IMPRESSION: *Essentially stable exam. Right basilar opacity and small right pleural effusion, without significant interval change. Electronically Signed   By: Ree Molt M.D.   On: 04/18/2023 14:21   CT Angio Chest PE W and/or Wo Contrast Result Date: 04/18/2023 CLINICAL DATA:  shortness of breath, tachycardia, hx of blood clots with missed blood thinner dose, evaluating PE vs aspiration PNA EXAM: CT ANGIOGRAPHY CHEST WITH CONTRAST TECHNIQUE: Multidetector CT imaging of the chest was performed using the standard protocol during bolus administration of intravenous contrast. Multiplanar CT image reconstructions and MIPs were obtained to evaluate the vascular anatomy. RADIATION DOSE REDUCTION: This exam was performed according to the departmental dose-optimization program which includes automated exposure control, adjustment of the mA and/or kV according to patient size and/or use of iterative reconstruction technique. CONTRAST:  75mL OMNIPAQUE  IOHEXOL  350 MG/ML SOLN COMPARISON:  CT angiography chest from 03/31/2023. FINDINGS: Cardiovascular: No evidence of embolism to the proximal subsegmental pulmonary artery level. Normal cardiac size. Redemonstration of moderate-to-large pericardial effusion without significant interval change. No aortic aneurysm. There are coronary artery calcifications, in keeping with coronary artery disease. There are also mild peripheral atherosclerotic vascular calcifications of thoracic  aorta and its major branches. Mediastinum/Nodes: Visualized thyroid gland appears grossly unremarkable. No solid / cystic mediastinal masses. The esophagus is nondistended precluding optimal assessment. No axillary, mediastinal or hilar lymphadenopathy by size criteria. Lungs/Pleura:  There are retained mucus secretions along the periphery of the upper trachea. There is interval increase in the peribronchovascular opacities mainly in the right lung with middle lobe/lower lobe predominance. There also slightly irregular/nodular interlobular septal thickening in the right lung. There is also irregularity along the right major fissure and minor fissure. There are multiple ill-defined predominantly centrilobular nodules in the left lung lower lobe with interval increase when compared to the prior exam. No new dense consolidation or lung collapse. Redemonstration of small loculated right pleural effusion. No left pleural effusion. No pneumothorax. Redemonstration of pleural calcifications in the right posteroinferior hemithorax, nonspecific but commonly seen as a sequela of prior asbestos related disease. Upper Abdomen: Redemonstration of extensive, ill-defined, hypoattenuating areas in the visualized liver, compatible with metastasis. There is dilated main pancreatic duct in the body/tail region with associated atrophy of the pancreas. Remaining visualized upper abdominal viscera are within normal limits. Musculoskeletal: The visualized soft tissues of the chest wall are grossly unremarkable. No suspicious osseous lesions. There are mild multilevel degenerative changes in the visualized spine. Review of the MIP images confirms the above findings. IMPRESSION: 1. No embolism the proximal subsegmental pulmonary artery level. 2. Interval increase in the previously seen peri-bronchovascular opacities mainly in the right lung. There is also slightly irregular/nodular interlobular septal thickening in the right lung. Small loculated right pleural effusion. 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 Correlate clinically. 3. Extensive ill-defined hypoattenuating areas in the liver, compatible with metastases. 4. Moderate-to-large pericardial effusion without significant  interval change. 5. Multiple other nonacute observations, as described above. Aortic Atherosclerosis (ICD10-I70.0). Electronically Signed   By: Ree Molt M.D.   On: 04/18/2023 14:19   DG Swallowing Func-Speech Pathology Result Date: 04/04/2023 Table formatting from the original result was not included. Modified Barium Swallow Study Patient Details Name: Tony Benson MRN: [id number] Date of Birth: May 25, 1960 Today's Date: 04/04/2023 HPI/PMH: HPI: Pt is a 63 y.o. male with medical history significant for Metastatic pancreatic Cancer on Palliative Chemotherapy, asthma, prostate Cancer, type 2 diabetes mellitus, hypertension, dyslipidemia, and PE on Eliquis , who presented to the emergency room with acute onset of dyspnea with associated cough productive of yellow sputum without wheezing.  Today patient was found to have hemoglobin 6.5.  He agreed for blood transfusion however during transfusion he developed worsening shortness of breath with tachycardia.  Transfusion was aborted and blood return to blood bank for transfusion reaction workup.  Patient has also been seen by cardiologist and awaiting repeat echo before decision is made on Large pericardial effusion, lung metastasis per MD note.  Per Oncology note: Patient was hospitalized November 2024 with shortness of breath/acute PE, bilateral DVT.  CT of the abdomen and pelvis found to have a pancreatic head mass and liver masses.  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  Patient is status post 2 cycles of gemcitabine  and Abraxane .  Now admitted to the hospital with pneumonia.  Palliative Care was consulted for GOC.   Chest CT: Large pericardial effusion increased compared to 02/27/2023.  Clinical correlation recommended to exclude tamponade physiology.  3. Increased bilateral centrilobular micro nodules and tree-in-bud  opacities with more confluence opacities in the right lung, likely  due to bronchopneumonia  from aspiration. Lymphangitic spread of  tumor could  appear similarly.  4. Irregular pleural thickening or loculated pleural effusion in the  lower right chest has increased compared to 02/27/2023. There is  associated pleural nodularity in the posterior lower lobe. This may be due to bronchopneumonia though metastases are not excluded. Clinical Impression: Clinical Impression: Pt presents with profound oropharyngeal dysphagia that is likely related to overall deconditioned state. As such he is at a very high risk of aspiration, malnutrition and dehydration when consuming POs. Specifically, when consuming thin liquids via spoon, nectar thick liquids via spoon and small cup sips as well as small boluses of puree, he presents with sensed and silent aspiration of all consistencies. When aspiration is sensed, he produced a delayed weak non-productive cough that was not effective in clearing aspirates or laryngeal penetrates. As such, would recommend NPO and discussion regarding GOC. Recommend extensive oral care to reduce the bacterial load of pt's salvia as his risk of aspirating his own salvia is high.     Analysis of swallow function:  Oral phase is c/b decrease propulsion of boluses over base of tongue  Pharyngeal phase is c/b decreased base of tongue strength, weak hyoid movement, decreased epiglottic deflection, delayed swallow initiation to leave of pyriform sinuses and weak pharyngeal parastasis resulting in reduced UES opening and passage of boluses as well s gross widespread pharyngeal residue throughout pharynx. Boluses were observed to be resting within the laryngeal vestibule and on pt's vocal cords with no ability to clear these penetrates even with a cued cough. Aspiration was gross when consuming thin liquids via spoon d/t delayed pharyngeal swallow. While less of the nectar and puree were aspirated, pharyngeal residue was greater resulting in continued build-up of penetrates. Factors that may increase  risk of adverse event in presence of aspiration Noe & Lianne 2021): Factors that may increase risk of adverse event in presence of aspiration Noe & Lianne 2021): Poor general health and/or compromised immunity; Reduced cognitive function; Limited mobility; Frail or deconditioned; Dependence for feeding and/or oral hygiene; Inadequate oral hygiene; Reduced saliva; Weak cough; Frequent aspiration of large volumes Recommendations/Plan: Swallowing Evaluation Recommendations Swallowing Evaluation Recommendations Recommendations: NPO Medication Administration: Via alternative means Oral care recommendations: Oral care QID (4x/day) Recommended consults: Consider Palliative care Caregiver Recommendations: -- (TBD) Treatment Plan Treatment Plan Treatment recommendations: No treatment recommended at this time Follow-up recommendations: No SLP follow up Recommendations Comment: Palliative Care f/u for GOC Recommendations Recommendations for follow up therapy are one component of a multi-disciplinary discharge planning process, led by the attending physician.  Recommendations may be updated based on patient status, additional functional criteria and insurance authorization. Assessment: Orofacial Exam: Orofacial Exam Oral Cavity: Oral Hygiene: Xerostomia Oral Cavity - Dentition: Adequate natural dentition Orofacial Anatomy: WFL Oral Motor/Sensory Function: WFL Anatomy: Anatomy: WFL Boluses Administered: Boluses Administered Boluses Administered: Thin liquids (Level 0); Mildly thick liquids (Level 2, nectar thick); Puree  Oral Impairment Domain: Oral Impairment Domain Lip Closure: No labial escape Tongue control during bolus hold: Not tested Bolus preparation/mastication: Slow prolonged chewing/mashing with complete recollection Bolus transport/lingual motion: Delayed initiation of tongue motion (oral holding); Slow tongue motion Oral residue: Residue collection on oral structures Location of oral residue : Floor of  mouth; Tongue Initiation of pharyngeal swallow : Pyriform sinuses  Pharyngeal Impairment Domain: Pharyngeal Impairment Domain Soft palate elevation: No bolus between soft palate (SP)/pharyngeal wall (PW) Laryngeal elevation: Partial superior movement of thyroid cartilage/partial approximation of arytenoids to epiglottic petiole Anterior hyoid excursion: Partial anterior movement; No anterior movement Epiglottic movement: Partial inversion; No  inversion Laryngeal vestibule closure: None, wide column air/contrast in laryngeal vestibule; Incomplete, narrow column air/contrast in laryngeal vestibule Pharyngeal stripping wave : Present - diminished Pharyngoesophageal segment opening: Minimal distention/minimal duration, marked obstruction of flow; Partial distention/partial duration, partial obstruction of flow Tongue base retraction: Narrow column of contrast or air between tongue base and PPW; Wide column of contrast or air between tongue base and PPW Pharyngeal residue: Majority of contrast within or on pharyngeal structures Location of pharyngeal residue: Tongue base; Valleculae; Pharyngeal wall; Pyriform sinuses  Esophageal Impairment Domain: Esophageal Impairment Domain Esophageal clearance upright position: Complete clearance, esophageal coating Pill: No data recorded Penetration/Aspiration Scale Score: Penetration/Aspiration Scale Score 7.  Material enters airway, passes BELOW cords and not ejected out despite cough attempt by patient: Thin liquids (Level 0); Mildly thick liquids (Level 2, nectar thick); Puree 8.  Material enters airway, passes BELOW cords without attempt by patient to eject out (silent aspiration) : Thin liquids (Level 0); Mildly thick liquids (Level 2, nectar thick); Puree Compensatory Strategies: No data recorded  General Information: Caregiver present: No  Diet Prior to this Study: Dysphagia 3 (mechanical soft); Thin liquids (Level 0)   Temperature : Normal   Respiratory Status: WFL    Supplemental O2: None (Room air)   History of Recent Intubation: No  Behavior/Cognition: Alert; Cooperative; Distractible; Requires cueing Self-Feeding Abilities: Needs assist with self-feeding Baseline vocal quality/speech: Normal Volitional Cough: Able to elicit Volitional Swallow: Able to elicit Exam Limitations: No limitations Goal Planning: Prognosis for improved oropharyngeal function: -- (Poor) Barriers to Reach Goals: Cognitive deficits; Time post onset; Severity of deficits; Overall medical prognosis Barriers/Prognosis Comment: Frail; Drowsy; Pain/pain meds; Cancer w/ Metastasis Patient/Family Stated Goal: none stated Consulted and agree with results and recommendations: Physician; Nurse Pain: Pain Assessment Pain Assessment: Faces Faces Pain Scale: 2 Pain Location: R shoulder; BLEs Pain Descriptors / Indicators: Aching Pain Intervention(s): Monitored during session; Premedicated before session End of Session: Start Time:SLP Start Time (ACUTE ONLY): 1430 Stop Time: SLP Stop Time (ACUTE ONLY): 1450 Time Calculation:SLP Time Calculation (min) (ACUTE ONLY): 20 min Charges: SLP Evaluations $ SLP Speech Visit: 1 Visit SLP Evaluations $BSS Swallow: 1 Procedure $MBS Swallow: 1 Procedure $Self Care/Home Management: 8-22 SLP visit diagnosis: SLP Visit Diagnosis: Dysphagia, oropharyngeal phase (R13.12) Past Medical History: Past Medical History: Diagnosis Date  Asthma   Cancer (HCC)   prostate  Diabetes mellitus without complication (HCC)   type II  Hyperlipidemia   Hypertension   Sleep apnea   uses Cpap machine  Past Surgical History: Past Surgical History: Procedure Laterality Date  CHOLECYSTECTOMY    COLONOSCOPY WITH PROPOFOL  N/A 11/18/2018  Procedure: COLONOSCOPY WITH PROPOFOL ;  Surgeon: Janalyn Keene NOVAK, MD;  Location: [facility] ENDOSCOPY;  Service: Gastroenterology;  Laterality: N/A;  COLONOSCOPY WITH PROPOFOL  N/A 02/05/2023  Procedure: COLONOSCOPY WITH PROPOFOL ;  Surgeon: Unk Corinn Skiff, MD;  Location:  [facility] ENDOSCOPY;  Service: Gastroenterology;  Laterality: N/A;  IR IMAGING GUIDED PORT INSERTION  03/24/2023  IR US  LIVER BIOPSY  03/03/2023  LAPAROSCOPIC RETROPUBIC PROSTATECTOMY    2023  LUNG SURGERY    ligation of thoracic duct  PROSTATE BIOPSY N/A 09/21/2020  Procedure: PROSTATE BIOPSY GRAYCE;  Surgeon: Kassie Ozell SAUNDERS, MD;  Location: [facility] ORS;  Service: Urology;  Laterality: N/A; Happi Overton 04/04/2023, 3:25 PM  US  Venous Img Lower Bilateral (DVT) Result Date: 04/02/2023 CLINICAL DATA:  BILATERAL lower extremity pain times months. [id number] Lower extremity pain [id number] EXAM: BILATERAL LOWER EXTREMITY VENOUS DOPPLER ULTRASOUND TECHNIQUE: Gray-scale sonography with graded compression, as  well as color Doppler and duplex ultrasound were performed to evaluate the lower extremity deep venous systems from the level of the common femoral vein and including the common femoral, femoral, profunda femoral, popliteal and calf veins including the posterior tibial, peroneal and gastrocnemius veins when visible. The superficial great saphenous vein was also interrogated. Spectral Doppler was utilized to evaluate flow at rest and with distal augmentation maneuvers in the common femoral, femoral and popliteal veins. COMPARISON:  Lower extremity venous duplex, most recently 03/25/2023, POSITIVE for BILATERAL lower extremity DVT of the RIGHT femoral and LEFT popliteal veins. FINDINGS: RIGHT LOWER EXTREMITY VENOUS Normal compressibility of the RIGHT common femoral and visualized portions of profunda femoral vein and great saphenous veins. Similar appearance of near-occlusive filling defect involving the RIGHT femoral, popliteal and visualized portions of the calf veins OTHER No evidence of superficial thrombophlebitis or abnormal fluid collection. Limitations: none LEFT LOWER EXTREMITY VENOUS Normal compressibility of the LEFT common femoral, superficial femoral, profunda femoral vein and great saphenous veins. Similar  appearance of near-occlusive filling defect within the LEFT popliteal and visualized portions of the calf veins. OTHER No evidence of superficial thrombophlebitis or abnormal fluid collection. Limitations: none IMPRESSION: Since lower extremity venous duplex dated 03/25/2023; 1. No new or worsening DVT within either lower extremity. 2. Similar appearance of BILATERAL lower extremity DVT, extending from the RIGHT femoral through the calf veins and LEFT popliteal through the calf veins Thom Hall, MD [facility] Electronically Signed   By: Thom Hall M.D.   On: 04/02/2023 13:38   ECHOCARDIOGRAM COMPLETE Result Date: 04/01/2023    ECHOCARDIOGRAM REPORT   Patient Name:   Tony Benson Date of Exam: 04/01/2023 Medical Rec #:  [id number]     Height:       70.0 in Accession #:    [id number]    Weight:       149.0 lb Date of Birth:  29-Sep-1960    BSA:          1.842 m Patient Age:    62 years      BP:           109/52 mmHg Patient Gender: M             HR:           100 bpm. Exam Location:  [facility] Procedure: 2D Echo, Cardiac Doppler and Color Doppler Indications:     Pericardial Effusion I31.3  History:         Patient has prior history of Echocardiogram examinations, most                  recent 03/04/2023. Risk Factors:Diabetes, Hypertension and                  Sleep Apnea.  Sonographer:     Christopher Furnace Referring Phys:  [id number] JAN A MANSY Diagnosing Phys: Lonni Hanson MD IMPRESSIONS  1. Left ventricular ejection fraction, by estimation, is 60 to 65%. The left ventricle has normal function. The left ventricle has no regional wall motion abnormalities. There is moderate left ventricular hypertrophy. Left ventricular diastolic parameters are consistent with Grade II diastolic dysfunction (pseudonormalization).  2. Right ventricular systolic function is normal. The right ventricular size is normal. There is mildly elevated pulmonary artery systolic pressure.   3. There is equivocal early diastolic collapse of the right ventricle. However, mitral and tricuspid valve inflow velocity respiratory variation is not consistent with tamponade physiology.. Large  pericardial effusion. The pericardial effusion is circumferential.  4. The mitral valve is normal in structure. Trivial mitral valve regurgitation. No evidence of mitral stenosis.  5. The aortic valve is tricuspid. Aortic valve regurgitation is not visualized. No aortic stenosis is present.  6. The inferior vena cava is normal in size with <50% respiratory variability, suggesting right atrial pressure of 8 mmHg. Comparison(s): A prior study was performed on 03/04/2023. The pericardial effusion appears somewhat larger. FINDINGS  Left Ventricle: Left ventricular ejection fraction, by estimation, is 60 to 65%. The left ventricle has normal function. The left ventricle has no regional wall motion abnormalities. The left ventricular internal cavity size was normal in size. There is  moderate left ventricular hypertrophy. Left ventricular diastolic parameters are consistent with Grade II diastolic dysfunction (pseudonormalization). Right Ventricle: The right ventricular size is normal. No increase in right ventricular wall thickness. Right ventricular systolic function is normal. There is mildly elevated pulmonary artery systolic pressure. The tricuspid regurgitant velocity is 3.00  m/s, and with an assumed right atrial pressure of 8 mmHg, the estimated right ventricular systolic pressure is 44.0 mmHg. Left Atrium: Left atrial size was normal in size. Right Atrium: Right atrial size was normal in size. Pericardium: There is equivocal early diastolic collapse of the right ventricle. However, mitral and tricuspid valve inflow velocity respiratory variation is not consistent with tamponade physiology. A large pericardial effusion is present. The pericardial effusion is circumferential. Mitral Valve: The mitral valve is normal in  structure. Trivial mitral valve regurgitation. No evidence of mitral valve stenosis. MV peak gradient, 4.8 mmHg. The mean mitral valve gradient is 3.0 mmHg. Tricuspid Valve: The tricuspid valve is normal in structure. Tricuspid valve regurgitation is mild. Aortic Valve: The aortic valve is tricuspid. Aortic valve regurgitation is not visualized. No aortic stenosis is present. Aortic valve mean gradient measures 4.0 mmHg. Aortic valve peak gradient measures 6.0 mmHg. Aortic valve area, by VTI measures 2.91 cm. Pulmonic Valve: The pulmonic valve was grossly normal. Pulmonic valve regurgitation is trivial. No evidence of pulmonic stenosis. Aorta: The aortic root is normal in size and structure. Pulmonary Artery: The pulmonary artery is not well seen. Venous: The inferior vena cava is normal in size with less than 50% respiratory variability, suggesting right atrial pressure of 8 mmHg. IAS/Shunts: No atrial level shunt detected by color flow Doppler.  LEFT VENTRICLE PLAX 2D LVIDd:         4.10 cm   Diastology LVIDs:         1.80 cm   LV e' medial:    6.42 cm/s LV PW:         1.50 cm   LV E/e' medial:  13.8 LV IVS:        1.20 cm   LV e' lateral:   15.00 cm/s LVOT diam:     2.10 cm   LV E/e' lateral: 5.9 LV SV:         58 LV SV Index:   32 LVOT Area:     3.46 cm  RIGHT VENTRICLE RV Basal diam:  2.90 cm RV Mid diam:    2.50 cm RV S prime:     20.90 cm/s TAPSE (M-mode): 1.8 cm LEFT ATRIUM           Index        RIGHT ATRIUM          Index LA diam:      2.40 cm 1.30 cm/m   RA Area:  9.46 cm LA Vol (A4C): 20.3 ml 11.02 ml/m  RA Volume:   14.30 ml 7.76 ml/m  AORTIC VALVE AV Area (Vmax):    2.70 cm AV Area (Vmean):   2.44 cm AV Area (VTI):     2.91 cm AV Vmax:           122.00 cm/s AV Vmean:          90.900 cm/s AV VTI:            0.200 m AV Peak Grad:      6.0 mmHg AV Mean Grad:      4.0 mmHg LVOT Vmax:         95.00 cm/s LVOT Vmean:        64.000 cm/s LVOT VTI:          0.168 m LVOT/AV VTI ratio: 0.84  AORTA Ao  Root diam: 3.05 cm MITRAL VALVE               TRICUSPID VALVE MV Area (PHT): 4.57 cm    TR Peak grad:   36.0 mmHg MV Area VTI:   2.32 cm    TR Vmax:        300.00 cm/s MV Peak grad:  4.8 mmHg MV Mean grad:  3.0 mmHg    SHUNTS MV Vmax:       1.09 m/s    Systemic VTI:  0.17 m MV Vmean:      80.1 cm/s   Systemic Diam: 2.10 cm MV Decel Time: 166 msec MV E velocity: 88.60 cm/s MV A velocity: 78.10 cm/s MV E/A ratio:  1.13 Lonni End MD Electronically signed by Lonni Hanson MD Signature Date/Time: 04/01/2023/2:19:37 PM    Final    CT Angio Chest PE W/Cm &/Or Wo Cm Result Date: 04/01/2023 CLINICAL DATA:  Cough and fever for 3 days. On chemo for pancreatic cancer. Elevated heart rate. PE suspected. EXAM: CT ANGIOGRAPHY CHEST WITH CONTRAST TECHNIQUE: Multidetector CT imaging of the chest was performed using the standard protocol during bolus administration of intravenous contrast. Multiplanar CT image reconstructions and MIPs were obtained to evaluate the vascular anatomy. RADIATION DOSE REDUCTION: This exam was performed according to the departmental dose-optimization program which includes automated exposure control, adjustment of the mA and/or kV according to patient size and/or use of iterative reconstruction technique. CONTRAST:  75mL OMNIPAQUE  IOHEXOL  350 MG/ML SOLN COMPARISON:  Same day chest radiograph and CTA chest 04/28/2022 FINDINGS: Cardiovascular: Right IJ CVC tip at the superior cavoatrial junction. Negative for acute pulmonary embolism. Large pericardial effusion increased compared to 02/27/2023. Clinical correlation recommended to exclude tamponade physiology. Mild aortic atherosclerotic calcification. Mediastinum/Nodes: Debris layering within the posterior trachea. Esophagus is unremarkable. Surgical clips adjacent to the lower esophagus. Mediastinal and hilar adenopathy is not well evaluated. Lungs/Pleura: Diffuse bronchial wall thickening scattered mucous plugging greatest in the right lower  lobe. Bilateral centrilobular micro nodules and tree-in-bud opacities with more confluence opacities in the right lung. Irregular pleural thickening or loculated pleural effusion in the lower right chest has increased compared to 02/27/2023. There is associated pleural nodularity in the posterior lower lobe. For example 14 x 9 mm nodule on series 5/image 97. Pleural calcifications in the posterior right lung base. No pneumothorax. Upper Abdomen: Incompletely evaluated large metastasis in the right hepatic lobe appears slightly increased compared to 02/27/2023, measuring up to 14.5 cm previously 13.3 cm. Partially visualized dilated main pancreatic duct. Pancreatic head is not included in the exam. Small amount of perisplenic ascites. Musculoskeletal:  No acute fracture or destructive osseous lesion. Review of the MIP images confirms the above findings. IMPRESSION: 1. Negative for acute pulmonary embolism. 2. Large pericardial effusion increased compared to 02/27/2023. Clinical correlation recommended to exclude tamponade physiology. 3. Increased bilateral centrilobular micro nodules and tree-in-bud opacities with more confluence opacities in the right lung, likely due to bronchopneumonia from aspiration. Lymphangitic spread of tumor could appear similarly. 4. Irregular pleural thickening or loculated pleural effusion in the lower right chest has increased compared to 02/27/2023. There is associated pleural nodularity in the posterior lower lobe. This may be due to bronchopneumonia though metastases are not excluded. 5. 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 Aortic Atherosclerosis (ICD10-I70.0). Electronically Signed   By: Norman Gatlin M.D.   On: 04/01/2023 00:53   DG Chest 2 View Result Date: 03/31/2023 CLINICAL DATA:  Cough and fever EXAM: CHEST - 2 VIEW COMPARISON:  Chest x-ray 03/05/2023.  Chest CT 02/27/2023. FINDINGS: There is a new right chest port with distal catheter tip projecting over the  distal SVC. The cardiac silhouette appears mildly enlarged, unchanged. Small right pleural effusion persists. Patchy opacities in the right lung base have decreased, but have not completely resolved. There is a new focal nodular density in the left mid lung measuring 8 mm. The left lung is otherwise clear. There is no pneumothorax or acute fracture. IMPRESSION: 1. Small right pleural effusion persists. 2. Patchy opacities in the right lung base have decreased, but have not completely resolved. 3. New 8 mm nodular density in the left mid lung. Recommend follow-up chest CT. Electronically Signed   By: Greig Pique M.D.   On: 03/31/2023 21:10   US  Venous Img Lower Bilateral Result Date: 03/25/2023 CLINICAL DATA:  Follow-up lower extremity DVT. History of prostate cancer. Patient is on anticoagulation. EXAM: BILATERAL LOWER EXTREMITY VENOUS DOPPLER ULTRASOUND TECHNIQUE: Gray-scale sonography with graded compression, as well as color Doppler and duplex ultrasound were performed to evaluate the lower extremity deep venous systems from the level of the common femoral vein and including the common femoral, femoral, profunda femoral, popliteal and calf veins including the posterior tibial, peroneal and gastrocnemius veins when visible. The superficial great saphenous vein was also interrogated. Spectral Doppler was utilized to evaluate flow at rest and with distal augmentation maneuvers in the common femoral, femoral and popliteal veins. COMPARISON:  Bilateral lower extremity venous Doppler ultrasound-02/26/2023 (positive for DVT extending from right femoral vein through right tibial veins as well as the left popliteal vein through the left tibial veins) FINDINGS: RIGHT LOWER EXTREMITY Common Femoral Vein: No evidence of thrombus. Normal compressibility, respiratory phasicity and response to augmentation. Saphenofemoral Junction: No evidence of thrombus. Normal compressibility and flow on color Doppler imaging. Profunda  Femoral Vein: No evidence of thrombus. Normal compressibility and flow on color Doppler imaging. Femoral Vein: There is predominantly occlusive DVT involving the proximal (image 41), mid (image 44) and distal (image 47) aspects of the right femoral vein, grossly unchanged compared to the 02/26/2023 examination. Popliteal Vein: Redemonstrated near occlusive DVT involving the right popliteal vein (images 47 and 50), similar to the 02/26/2023 examination. Calf Veins: Redemonstrated occlusive DVT involving both paired divisions of the left posterior tibial (image 56) as well as both paired divisions of the right peroneal veins (image 58). Superficial Great Saphenous Vein: No evidence of thrombus. Normal compressibility. Other Findings:  None. _________________________________________________________ LEFT LOWER EXTREMITY Common Femoral Vein: No evidence of thrombus. Normal compressibility, respiratory phasicity and response to augmentation. Saphenofemoral Junction: No evidence of  thrombus. Normal compressibility and flow on color Doppler imaging. Profunda Femoral Vein: No evidence of thrombus. Normal compressibility and flow on color Doppler imaging. Femoral Vein: No evidence of thrombus. Normal compressibility, respiratory phasicity and response to augmentation. Popliteal Vein: Redemonstrated occlusive DVT involving the left popliteal vein (images 21 and 25), unchanged compared to the 02/26/2023 examination. Calf Veins: Redemonstrated occlusive DVT involving both paired divisions of the left posterior tibial vein (images 27 and 28), unchanged compared to the 02/26/2023 examination. The left peroneal veins appear patent where imaged (image 30), improved compared to the 02/26/2023 examination. Superficial Great Saphenous Vein: No evidence of thrombus. Normal compressibility. Other Findings:  None. IMPRESSION: 1. Redemonstrated bilateral lower extremity DVT extending from the right femoral vein through the right calf  veins, grossly unchanged compared to the 02/26/2023 examination. 2. Resolved left peroneal DVT. 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 Electronically Signed   By: Norleen Roulette M.D.   On: 03/25/2023 12:10   IR IMAGING GUIDED PORT INSERTION Result Date: 03/24/2023 CLINICAL DATA:  Metastatic pancreas CANCER TO THE LIVER EXAM: RIGHT INTERNAL JUGULAR SINGLE LUMEN POWER PORT CATHETER INSERTION Date:  03/24/2023 03/24/2023 11:55 am Radiologist:  M. Frederic Specking, MD Guidance:  Ultrasound and fluoroscopic MEDICATIONS: 1% lidocaine  local with epinephrine  ANESTHESIA/SEDATION: Versed  0.5 mg IV; Fentanyl  25 mcg IV; Moderate Sedation Time:  30 minutes The patient was continuously monitored during the procedure by the interventional radiology nurse under my direct supervision. FLUOROSCOPY: 0 minutes, 71 seconds (3.4 mGy) COMPLICATIONS: None immediate. CONTRAST:  None. PROCEDURE: Informed consent was obtained from the patient following explanation of the procedure, risks, benefits and alternatives. The patient understands, agrees and consents for the procedure. All questions were addressed. A time out was performed. Maximal barrier sterile technique utilized including caps, mask, sterile gowns, sterile gloves, large sterile drape, hand hygiene, and 2% chlorhexidine  scrub. Under sterile conditions and local anesthesia, right internal jugular micropuncture venous access was performed. Access was performed with ultrasound. Images were obtained for documentation of the patent right internal jugular vein. A guide wire was inserted followed by a transitional dilator. This allowed insertion of a guide wire and catheter into the IVC. Measurements were obtained from the SVC / RA junction back to the right IJ venotomy site. In the right infraclavicular chest, a subcutaneous pocket was created over the second  anterior rib. This was done under sterile conditions and local anesthesia. 1% lidocaine  with epinephrine  was utilized for this. A 2.5 cm incision was made in the skin. Blunt dissection was performed to create a subcutaneous pocket over the right pectoralis major muscle. The pocket was flushed with saline vigorously. There was adequate hemostasis. The port catheter was assembled and checked for leakage. The port catheter was secured in the pocket with two retention sutures. The tubing was tunneled subcutaneously to the right venotomy site and inserted into the SVC/RA junction through a valved peel-away sheath. Position was confirmed with fluoroscopy. Images were obtained for documentation. The patient tolerated the procedure well. No immediate complications. Incisions were closed in a two layer fashion with 4 - 0 Vicryl suture. Dermabond was applied to the skin. The port catheter was accessed, blood was aspirated followed by saline and heparin  flushes. Needle was removed. A dry sterile dressing was applied. IMPRESSION: Ultrasound and fluoroscopically guided right internal jugular single lumen power port catheter insertion. Tip in the SVC/RA junction. Catheter ready for use. Electronically  Signed   By: CHRISTELLA.  Shick M.D.   On: 03/24/2023 12:17    PERFORMANCE STATUS (ECOG) : 3 - Symptomatic, >50% confined to bed  Review of Systems Unless otherwise noted, a complete review of systems is negative.  Physical Exam General: Frail, ill-appearing, cachectic HEENT: NGT Pulmonary: Unlabored Extremities: no edema, no joint deformities Skin: no rashes Neurological: Lethargic  IMPRESSION: Patient well-known to me from the clinic and previous hospitalziations.  I have previously met with patient and family multiple times to discuss goals.  Unfortunately, patient is declining and very likely rapidly nearing end-of-life.  I do feel confident that family understand that his prognosis is extremely poor.  However, patient  has consistently and unwaveringly communicated a desire to remain a full code/full scope of treatment and family again confirmed that today. Patient and family have heard multiple times that aggressive care at end-of-life would be futile and traumatic. Daughter is an [employer] and also understands this on a technical level. Family have desired to support patient's decision making.   Case discussed with Dr. Rennie and will arrange a family meeting for further discussion regarding goals.  PLAN: -Continue current scope of treatment -Full code -Will arrange family meeting  Case and plan discussed with Dr. Rennie  Time Total: 60 minutes  Visit consisted of counseling and education dealing with the complex and emotionally intense issues of symptom management and palliative care in the setting of serious and potentially life-threatening illness.Greater than 50%  of this time was spent counseling and coordinating care related to the above assessment and plan.  Signed by: Fonda Mower, PhD, NP-C

## 2023-04-22 NOTE — Progress Notes (Addendum)
 [facility] LIAISON NOTE   Received request from Sidra Mower, NP, for evaluation for Hospice InPatient Unit [facility]). Spoke with patient's wife, Jermani Eberlein and daughter Jeoffrey to initiate education related to hospice philosophy, services, and team approach to care. Patient/family verbalized understanding of information given. Per discussion, the plan is for discharge to the [facility] today via EMS.  The address has been verified and is correct in the chart.  [facility] RN to call report to the Hospice Home at [phone]  This RN will arrange EMS transportation when the hospital team is ready for discharge.  Please medicate the patient prior to EMS transport as needed for comfort during transport.  Please send signed and completed DNR home with patient/family if applicable.   Please provide prescriptions at discharge as needed to ensure ongoing symptom management.  [facility] information and contact numbers given to [facility], spouse.  Above information shared with Lauraine Carpen, Transitions of Care Manager and hospital medical team.  Please call with any hospice related questions or concerns.  Thank you for the opportunity to participate in this patient's care.   Saddie HILARIO Na, RN Nurse Liaison [phone]

## 2023-04-22 NOTE — Progress Notes (Signed)
 Pt transferred to hospice home via ems from ICU 12, all belongings were taken with family (wife and daughter) including patients gold wedding ring he had on. Patient given prns for comfort during transfer. Foley and R chest port still intact

## 2023-04-22 NOTE — Progress Notes (Signed)
 Palliative medicine:  Follow-up visit.  Jointly with Dr. Rennie, we met with patient's daughter.  Patient's wife participated in the visit via phone.  Overall, patient is declining and very likely at end-of-life.  Family verbalized understanding that patient is no longer a candidate for any cancer treatments.  They would like to focus on patient's comfort and are interested in transferring patient to the hospice home for end-of-life care.  They also verbalized agreement with DNR/DNI.  Hospice liaison to coordinate.  I spoke with hospice physician, Dr. Jerrell, regarding the transfer.  Time Total: 20 minutes  Visit consisted of counseling and education dealing with the complex and emotionally intense issues of symptom management and palliative care in the setting of serious and potentially life-threatening illness.Greater than 50%  of this time was spent counseling and coordinating care related to the above assessment and plan.  Signed by: Fonda Mower, PhD, NP-C

## 2023-04-22 NOTE — Evaluation (Addendum)
 Physical Therapy Evaluation Patient Details Name: Tony Benson MRN: [id number] DOB: 1960/09/27 Today's Date: 04/22/2023  History of Present Illness  Patient is a 63 year old male with sepsis secondary to aspiration pneumonia. History of metastatic pancreatic cancer on palliative chemotherapy, asthma, prostate cancer, IDDM, HTN, HLD, PE.    Clinical Impression  Patient seen for PT evaluation. The patient is lethargic throughout and needs stimulation to keep eyes opened. He was unable to follow single step commands this session. Total assistance is required for all bed level mobility. Increased tone with PROM of lower extremities. The patient does grip the bed rail while in sidelying position which seems to be more of an automatic response versus true command following. The family report this is drastically different than prior level of function as patient was independently ambulating without a device most of the time. PT will follow up in attempts to maximize independence and decrease caregiver burden as patient is able to participate.       If plan is discharge home, recommend the following: Two people to help with walking and/or transfers;Two people to help with bathing/dressing/bathroom;Supervision due to cognitive status;Help with stairs or ramp for entrance;Assist for transportation;Assistance with feeding;Direct supervision/assist for medications management;Direct supervision/assist for financial management;Assistance with cooking/housework   Can travel by private vehicle   No    Equipment Recommendations  (to be determined)  Recommendations for Other Services       Functional Status Assessment Patient has had a recent decline in their functional status and demonstrates the ability to make significant improvements in function in a reasonable and predictable amount of time.     Precautions / Restrictions Precautions Precautions: Fall Restrictions Weight Bearing Restrictions Per  Provider Order: No      Mobility  Bed Mobility Overal bed mobility: Needs Assistance Bed Mobility: Rolling Rolling: Total assist, +2 for physical assistance         General bed mobility comments: patient held the bed rail while in sidelying position (seems to be more of an automatic response than truly command following)    Transfers                   General transfer comment: unsafe to attempt    Ambulation/Gait                  Stairs            Wheelchair Mobility     Tilt Bed    Modified Rankin (Stroke Patients Only)       Balance                                             Pertinent Vitals/Pain Pain Assessment Pain Assessment: PAINAD Breathing: normal Negative Vocalization: none Facial Expression: smiling or inexpressive Body Language: relaxed Consolability: no need to console PAINAD Score: 0    Home Living Family/patient expects to be discharged to:: Private residence Living Arrangements: Spouse/significant other Available Help at Discharge: Family Type of Home: House Home Access: Level entry     Alternate Level Stairs-Number of Steps: flight Home Layout: Two level;Able to live on main level with bedroom/bathroom Home Equipment: Shower seat - built [employer] (2 wheels);BSC/3in1      Prior Function Prior Level of Function : Needs assist             Mobility Comments: walking  without device most of the time. no falls ADLs Comments: stand by/supervision provided for showering. spouse assist with lower body dressing     Extremity/Trunk Assessment   Upper Extremity Assessment Upper Extremity Assessment: Generalized weakness;Difficult to assess due to impaired cognition (Increased tone/catching appreciated with PROM of BUE L>R this date. Full ROM achieveable with assistance however, pt is not able to follow VCs to participate in formal assessment.)    Lower Extremity Assessment Lower  Extremity Assessment: Difficult to assess due to impaired cognition;Defer to PT evaluation    Cervical / Trunk Assessment Cervical / Trunk Assessment:  (right head/neck rotation preference)  Communication   Communication Communication: Difficulty following commands/understanding;Difficulty communicating thoughts/reduced clarity of speech Following commands:  (unable to) Cueing Techniques: Verbal cues;Gestural cues;Tactile cues  Cognition Arousal: Stuporous Behavior During Therapy: Flat affect                                   General Comments: unable to follow commands but does functionally assist with holding the bed rail with rolling (more of an automatic response to being repositioned). does open eyes  for bried periods to voice and stimulation with mild increased alertness with rolling.        General Comments General comments (skin integrity, edema, etc.): vitals stable with repositioning and PROM. small bowel incontience with total care required for pericare    Exercises     Assessment/Plan    PT Assessment Patient needs continued PT services  PT Problem List Decreased strength;Decreased range of motion;Decreased activity tolerance;Decreased balance;Decreased mobility;Decreased cognition       PT Treatment Interventions DME instruction;Gait training;Functional mobility training;Therapeutic activities;Therapeutic exercise;Balance training;Neuromuscular re-education;Cognitive remediation;Patient/family education;Stair training;Wheelchair mobility training    PT Goals (Current goals can be found in the Care Plan section)  Acute Rehab PT Goals Patient Stated Goal: patient unable to particiapte with goal setting PT Goal Formulation: With family Time For Goal Achievement: 05/06/23 Potential to Achieve Goals: Poor    Frequency Min 1X/week     Co-evaluation PT/OT/SLP Co-Evaluation/Treatment: Yes Reason for Co-Treatment: To address functional/ADL  transfers;For patient/therapist safety;Complexity of the patient's impairments (multi-system involvement) PT goals addressed during session: Mobility/safety with mobility OT goals addressed during session: ADL's and self-care       AM-PAC PT 6 Clicks Mobility  Outcome Measure Help needed turning from your back to your side while in a flat bed without using bedrails?: Total Help needed moving from lying on your back to sitting on the side of a flat bed without using bedrails?: Total Help needed moving to and from a bed to a chair (including a wheelchair)?: Total Help needed standing up from a chair using your arms (e.g., wheelchair or bedside chair)?: Total Help needed to walk in hospital room?: Total Help needed climbing 3-5 steps with a railing? : Total 6 Click Score: 6    End of Session   Activity Tolerance: Patient limited by lethargy Patient left: in bed;with call bell/phone within reach;with bed alarm set;with family/visitor present;with nursing/sitter in room (nurse and MD in the room) Nurse Communication: Mobility status PT Visit Diagnosis: Unsteadiness on feet (R26.81);Muscle weakness (generalized) (M62.81)    Time: 0940-1007 PT Time Calculation (min) (ACUTE ONLY): 27 min   Charges:   PT Evaluation $PT Eval Moderate Complexity: 1 Mod   PT General Charges $$ ACUTE PT VISIT: 1 Visit         Tony Benson, PT, MPT  Tony Benson 04/22/2023, 12:42 PM

## 2023-04-22 NOTE — Progress Notes (Signed)
 PHARMACY CONSULT NOTE - FOLLOW UP  Pharmacy Consult for Electrolyte Monitoring and Replacement   Recent Labs: Potassium (mmol/L)  Date Value  04/22/2023 4.3   Magnesium  (mg/dL)  Date Value  98/92/7974 2.1   Calcium  (mg/dL)  Date Value  98/92/7974 10.0   Albumin  (g/dL)  Date Value  98/95/7974 2.0 (L)   Phosphorus (mg/dL)  Date Value  98/92/7974 1.5 (L)   Sodium (mmol/L)  Date Value  04/22/2023 150 (H)    Assessment: 63 y.o. male with medical history significant of metastatic pancreatic cancer on palliative chemotherapy, asthma, prostate cancer s/p prostatectomy (2023), IDDM, HTN, HLD, PE on Eliquis , chronic pain, diverticulosis, asthma, OSA, anxiety and dysphagia with silent aspiration (followed by o/p SLP) who is admitted with aspiration PNA and sepsis. Pharmacy is asked to follow and replace electrolytes  Nutrition: Osmolyte + FWF 200 mL per tube every 4 hours  Goal of Therapy:  Electrolytes WNL  Plan:  ---phosphorous 500 pg per tube (contains phosphorus 16 mMol, potassium 2.2 mEq) x 3 ---continue free water  flushes at 200 mL per tube every 4 hours ISO hypernatremia ---recheck electrolytes in am  Adriana JONETTA Bolster ,PharmD Clinical Pharmacist 04/22/2023 6:57 AM

## 2023-04-22 NOTE — Progress Notes (Signed)
 Cancel all appointments-patient going to hospice.  GB

## 2023-04-22 NOTE — Discharge Summary (Signed)
 Physician Discharge Summary   Patient: Tony Benson MRN: [id number] DOB: 12-03-1960  Admit date:     04/18/2023  Discharge date: 04/22/23  Discharge Physician: [name]   PCP: Weyman Bright, MD   Recommendations at discharge:   Follow-up with hospice team at the hospice facility  Discharge Diagnoses: Principal Problem:   Sepsis [facility]) Active Problems:   Malignant pericardial effusion   Protein-calorie malnutrition, severe   Pancreatic cancer metastasized to liver (HCC)   Aspiration pneumonia (HCC)   Acute metabolic encephalopathy   Hypernatremia  Resolved Problems:   * No resolved hospital problems. *  Hospital Course:  Tony Benson is a 63 y.o. male with medical history significant of metastatic pancreatic cancer on palliative chemotherapy, asthma, prostate cancer, IDDM, HTN, HLD, PE on Eliquis , presented with worsening of cough and shortness of breath.   Patient has had multiple aspiration pneumonia since November last year.  He has been following with speech therapy who did a barium swallow recently showed patient has silent aspiration with increased aspiration pneumonia risk.  His wife reported that patient has coughing and choking episodes after eating or drinking.   He was admitted to the hospital for sepsis secondary to aspiration pneumonia and severe neutropenia.      Assessment and Plan:  Sepsis secondary to aspiration pneumonia: Was treated with IV cefepime  and Flagyl .  There was no growth on blood cultures.  MRSA screen was negative.      Consolidation in right hemithorax: Right-sided thoracentesis could not be performed on 04/21/2023 because ultrasound of the chest did not show any drainable fluid collection. Large right pleural effusion has been ruled out. He has been transitioned to comfort care    Acute hypoxic respiratory failure: He is on 4 L/min oxygen  but oxygen  saturation on room air has been adequate.  Oxygen  for comfort as needed.     Acute  metabolic encephalopathy: Patient has become more lethargic and unable to use hand gestures for communication.     Severe neutropenia: Resolved. Worsening leukocytosis likely secondary to recent use of filgrastim      Dysphagia, severe malnutrition: Remove NG tube and discontinue enteral nutrition because of comfort care     Worsening hypernatremia: Discontinue free water  flushes    Hypokalemia: Improved     Large pericardial effusion: No evidence of tamponade.  Repeat 2D echo on 04/19/2023 showed large pericardial effusion without cardiac tamponade, EF estimated at 60 to 65%. Repeat 2D on 04/21/2023 echo showed persistent large pericardial effusion     Metastatic pancreatic cancer with metastasis to the liver: S/p gemcitabine  and Abraxane  on 04/11/2023.  Analgesics as needed for pain.  Prognosis is poor. Transition to comfort care     Anemia of chronic disease: H&H stable.  No indication for blood transfusion at this time.       Type II DM with hyperglycemia: Discontinue CBG and insulin  because of comfort care    History of DVT and pulmonary embolism: Eliquis  will not be continued at discharge because of comfort care     Stage II sacral decubitus ulcer: Present on admission.     General weakness: Discontinue PT and OT consult    Patient's wife and daughter have requested comfort care measures.  He was transitioned to comfort care in the hospital.  He was evaluated by the hospice team and he is deemed to be a candidate for discharge to the hospice house.  Patient will be discharged to the hospice house today.  Family agreeable with  the plan.       Consultants: Oncologist, palliative care Procedures performed: None Disposition:  Hospice house Diet recommendation:  Discharge Diet Orders (From admission, onward)     Start     Ordered   04/22/23 0000  Diet - low sodium heart healthy        04/22/23 1609           NPO   DISCHARGE MEDICATION: Allergies as of  04/22/2023       Reactions   Other Anaphylaxis   peanuts   Morphine And Codeine Itching   Tree Extract Other (See Comments)   ALMONDS  ---- EYE/ FACIAL SWELLING   Wound Dressing Adhesive Rash        Medication List     STOP taking these medications    ALPRAZolam  0.5 MG tablet Commonly known as: XANAX    apixaban  5 MG Tabs tablet Commonly known as: ELIQUIS    atorvastatin  10 MG tablet Commonly known as: LIPITOR   Dulera  100-5 MCG/ACT Aero Generic drug: mometasone -formoterol    feeding supplement Liqd   HYDROmorphone  HCl 1 MG/ML Liqd Commonly known as: DILAUDID    insulin  aspart 100 UNIT/ML FlexPen Commonly known as: NOVOLOG    Jardiance  25 MG Tabs tablet Generic drug: empagliflozin    lidocaine -prilocaine  cream Commonly known as: EMLA    metoprolol  tartrate 50 MG tablet Commonly known as: LOPRESSOR    naloxone  4 MG/0.1ML Liqd nasal spray kit Commonly known as: NARCAN    OLANZapine  5 MG tablet Commonly known as: ZYPREXA        TAKE these medications    fentaNYL  12 MCG/HR Commonly known as: DURAGESIC  Place 1 patch onto the skin every 3 (three) days.        Discharge Exam: Filed Weights   04/20/23 0350 04/21/23 0500 04/22/23 0427  Weight: 64.5 kg 65.8 kg 64.7 kg     Condition at discharge: poor  The results of significant diagnostics from this hospitalization (including imaging, microbiology, ancillary and laboratory) are listed below for reference.   Imaging Studies: US  CHEST (PLEURAL EFFUSION) Result Date: 04/21/2023 CLINICAL DATA:  Interventional Radiology asked to assess the patient for possible right-sided thoracentesis. EXAM: CHEST ULTRASOUND COMPARISON:  DG chest April 20, 2023 FINDINGS: Limited ultrasound evaluation of the right lung revealed consolidated lung tissue with scattered areas of fluid. No drainable collections to target. No procedure performed. IMPRESSION: Limited ultrasound evaluation of the right lung revealed scattered areas of  fluid with no drainable collections to target. No procedure performed in the image findings were discussed with the patient's family. Ultrasound images reviewed by Warren Dais NP. Electronically Signed   By: Marcey Moan M.D.   On: 04/21/2023 17:20   ECHOCARDIOGRAM LIMITED Result Date: 04/21/2023    ECHOCARDIOGRAM LIMITED REPORT   Patient Name:   Tony Benson Date of Exam: 04/21/2023 Medical Rec #:  [id number]     Height:       70.0 in Accession #:    [id number]    Weight:       145.1 lb Date of Birth:  Mar 13, 1961    BSA:          1.821 m Patient Age:    62 years      BP:           113/63 mmHg Patient Gender: M             HR:           127 bpm. Exam Location:  [facility] Procedure: Limited Echo, Cardiac Doppler and Color  Doppler Indications:     Pericardial Effusion I31.3  History:         Patient has prior history of Echocardiogram examinations, most                  recent 04/19/2023. Risk Factors:Hypertension and Diabetes.  Sonographer:     Christopher Furnace Referring Phys:  [id number] DARRYLE NED O'NEAL Diagnosing Phys: Deatrice Cage MD IMPRESSIONS  1. Left ventricular ejection fraction, by estimation, is 55 to 60%. The left ventricle has normal function. The left ventricle has no regional wall motion abnormalities. Left ventricular diastolic parameters are consistent with Grade I diastolic dysfunction (impaired relaxation).  2. Right ventricular systolic function is normal. The right ventricular size is normal. Tricuspid regurgitation signal is inadequate for assessing PA pressure.  3. Large pericardial effusion. The pericardial effusion is circumferential. No Rv diastolic collapse.  4. The mitral valve is normal in structure. No evidence of mitral valve regurgitation. No evidence of mitral stenosis.  5. The aortic valve is normal in structure. Aortic valve regurgitation is not visualized. No aortic stenosis is present.  6. The inferior vena cava is dilated in size with >50% respiratory variability, suggesting right  atrial pressure of 8 mmHg. Comparison(s): A prior study was performed on 04/19/2023. No significant change from prior study. FINDINGS  Left Ventricle: Left ventricular ejection fraction, by estimation, is 55 to 60%. The left ventricle has normal function. The left ventricle has no regional wall motion abnormalities. The left ventricular internal cavity size was normal in size. There is  no left ventricular hypertrophy. Left ventricular diastolic parameters are consistent with Grade I diastolic dysfunction (impaired relaxation). Right Ventricle: The right ventricular size is normal. No increase in right ventricular wall thickness. Right ventricular systolic function is normal. Tricuspid regurgitation signal is inadequate for assessing PA pressure. The tricuspid regurgitant velocity is 2.23 m/s, and with an assumed right atrial pressure of 8 mmHg, the estimated right ventricular systolic pressure is 27.9 mmHg. Left Atrium: Left atrial size was normal in size. Right Atrium: Right atrial size was normal in size. Pericardium: A large pericardial effusion is present. The pericardial effusion is circumferential. Mitral Valve: The mitral valve is normal in structure. No evidence of mitral valve stenosis. Tricuspid Valve: The tricuspid valve is normal in structure. Tricuspid valve regurgitation is not demonstrated. No evidence of tricuspid stenosis. Aortic Valve: The aortic valve is normal in structure. Aortic valve regurgitation is not visualized. No aortic stenosis is present. Pulmonic Valve: The pulmonic valve was normal in structure. Pulmonic valve regurgitation is not visualized. No evidence of pulmonic stenosis. Aorta: The aortic root is normal in size and structure. Venous: The inferior vena cava is dilated in size with greater than 50% respiratory variability, suggesting right atrial pressure of 8 mmHg. IAS/Shunts: No atrial level shunt detected by color flow Doppler. Additional Comments: Spectral Doppler performed.  Color Doppler performed.  LEFT VENTRICLE PLAX 2D LVIDd:         4.40 cm Diastology LVIDs:         2.80 cm LV e' medial:    9.36 cm/s LV PW:         1.20 cm LV E/e' medial:  7.7 LV IVS:        1.10 cm LV e' lateral:   20.50 cm/s                        LV E/e' lateral: 3.5  RIGHT VENTRICLE RV Basal diam:  2.40 cm RV Mid diam:    2.20 cm RV S prime:     20.00 cm/s TAPSE (M-mode): 1.7 cm LEFT ATRIUM         Index       RIGHT ATRIUM          Index LA diam:    3.50 cm 1.92 cm/m  RA Area:     7.69 cm                                 RA Volume:   10.80 ml 5.93 ml/m  MITRAL VALVE               TRICUSPID VALVE MV Area (PHT): 9.03 cm    TR Peak grad:   19.9 mmHg MV Decel Time: 84 msec     TR Vmax:        223.00 cm/s MV E velocity: 71.80 cm/s MV A velocity: 87.50 cm/s MV E/A ratio:  0.82 Deatrice Cage MD Electronically signed by Deatrice Cage MD Signature Date/Time: 04/21/2023/3:22:19 PM    Final    DG Chest Port 1 View Result Date: 04/20/2023 CLINICAL DATA:  63 year old male with history of acute respiratory failure. EXAM: PORTABLE CHEST 1 VIEW COMPARISON:  Chest x-ray 04/29/2023. FINDINGS: A feeding tube is seen extending into the abdomen, however, the tip of the feeding tube extends below the lower margin of the image. Right internal jugular single-lumen Port-A-Cath with tip terminating at the superior cavoatrial junction. Substantially worsened aeration throughout the right lung which is now nearly completely opacified, indicative of progressive atelectasis and/or consolidation, also with superimposed large right pleural effusion. Patchy areas of interstitial prominence an ill-defined opacities scattered throughout the left lung, concerning for bronchopneumonia. No left pleural effusion. No pneumothorax. No evidence of pulmonary edema. Heart size appears normal. Mediastinal contours are distorted by patient positioning and rotation to the right. IMPRESSION: 1. Enlarging large right pleural effusion with increasing  atelectasis and/or consolidation throughout nearly the entire right lung, substantially worsened compared with yesterday's examination. 2. Probable left-sided bronchopneumonia. Electronically Signed   By: Toribio Aye M.D.   On: 04/20/2023 08:17   DG Chest Port 1 View Result Date: 04/19/2023 CLINICAL DATA:  Feeding tube placement EXAM: PORTABLE CHEST 1 VIEW COMPARISON:  X-ray 04/17/2023 and older.  CTA 04/18/2023. FINDINGS: Feeding tube in place with tip extending into the left upper quadrant, likely along the midbody of the stomach. Separate right IJ catheter with the tip overlying the SVC right atrial junction. Underinflation with persistent right-sided pleural effusion and adjacent opacity. Patchy parenchymal opacities as well as left mid to lower lung, more pronounced in the prior x-ray but today's x-rays also less well inflated. No pneumothorax. No left effusion. Stable cardiopericardial silhouette. IMPRESSION: Feeding tube with the tip overlying the midbody of the stomach. Increasing parenchymal opacities in the left lung base. Persistent patchy opacities in the right lung with a right-sided pleural effusion. Chest port Recommend follow up Electronically Signed   By: Ranell Bring M.D.   On: 04/19/2023 14:17   ECHOCARDIOGRAM COMPLETE Result Date: 04/19/2023    ECHOCARDIOGRAM REPORT   Patient Name:   Tony Benson Date of Exam: 04/19/2023 Medical Rec #:  [id number]     Height:       70.0 in Accession #:    [id number]    Weight:       146.6 lb Date of Birth:  April 09, 1961  BSA:          1.829 m Patient Age:    62 years      BP:           104/56 mmHg Patient Gender: M             HR:           96 bpm. Exam Location:  [facility] Procedure: 2D Echo Indications:     Pericardial effusion I31.3  History:         Patient has prior history of Echocardiogram examinations, most                  recent 04/01/2023.  Sonographer:     Thedora Louder RDCS, FASE Referring Phys:  [id number] CORT ONEIDA MANA Diagnosing Phys: Darryle Decent MD IMPRESSIONS  1. Large pericardial effusion up to 2.1 cm in the apical region of the LV. It appears similar to the prior study. There is no overt RV diastolic collapse. There is late systolic collapse of the RA, which is a nonspecific finding. There is 25% variation in MV inflow noted. The IVC is dilated. Overall, the effusion appears similar to the prior study with no overt features of tamponde, but several features suggest possible early tamponade. Clinical correlation is recommended. Large pericardial effusion. The pericardial effusion is circumferential.  2. Left ventricular ejection fraction, by estimation, is 60 to 65%. The left ventricle has normal function. The left ventricle has no regional wall motion abnormalities. There is mild concentric left ventricular hypertrophy. Left ventricular diastolic parameters were normal.  3. Right ventricular systolic function is normal. The right ventricular size is normal. There is moderately elevated pulmonary artery systolic pressure. The estimated right ventricular systolic pressure is 45.5 mmHg.  4. The mitral valve is grossly normal. Trivial mitral valve regurgitation. No evidence of mitral stenosis.  5. The aortic valve is tricuspid. Aortic valve regurgitation is not visualized. No aortic stenosis is present.  6. The inferior vena cava is dilated in size with >50% respiratory variability, suggesting right atrial pressure of 8 mmHg. FINDINGS  Left Ventricle: Left ventricular ejection fraction, by estimation, is 60 to 65%. The left ventricle has normal function. The left ventricle has no regional wall motion abnormalities. The left ventricular internal cavity size was normal in size. There is  mild concentric left ventricular hypertrophy. Left ventricular diastolic parameters were normal. Right Ventricle: The right ventricular size is normal. No increase in right ventricular wall thickness. Right ventricular systolic function is normal. There is moderately  elevated pulmonary artery systolic pressure. The tricuspid regurgitant velocity is 2.76 m/s, and with an assumed right atrial pressure of 15 mmHg, the estimated right ventricular systolic pressure is 45.5 mmHg. Left Atrium: Left atrial size was normal in size. Right Atrium: Right atrial size was normal in size. Pericardium: Large pericardial effusion up to 2.1 cm in the apical region of the LV. It appears similar to the prior study. There is no overt RV diastolic collapse. There is late systolic collapse of the RA, which is a nonspecific finding. There is 25% variation in MV inflow noted. The IVC is dilated. Overall, the effusion appears similar to the prior study with no overt features of tamponde, but several features suggest possible early tamponade. Clinical correlation is recommended. A large pericardial  effusion is present. The pericardial effusion is circumferential. Mitral Valve: The mitral valve is grossly normal. Trivial mitral valve regurgitation. No evidence of mitral valve stenosis. Tricuspid Valve: The tricuspid valve is  grossly normal. Tricuspid valve regurgitation is trivial. No evidence of tricuspid stenosis. Aortic Valve: The aortic valve is tricuspid. Aortic valve regurgitation is not visualized. No aortic stenosis is present. Aortic valve peak gradient measures 8.2 mmHg. Pulmonic Valve: The pulmonic valve was grossly normal. Pulmonic valve regurgitation is not visualized. No evidence of pulmonic stenosis. Aorta: The aortic root and ascending aorta are structurally normal, with no evidence of dilitation. Venous: The inferior vena cava is dilated in size with greater than 50% respiratory variability, suggesting right atrial pressure of 8 mmHg. IAS/Shunts: The atrial septum is grossly normal.  LEFT VENTRICLE PLAX 2D LVIDd:         3.40 cm   Diastology LVIDs:         2.20 cm   LV e' medial:    10.70 cm/s LV PW:         1.30 cm   LV E/e' medial:  9.3 LV IVS:        1.20 cm   LV e' lateral:   17.70  cm/s LVOT diam:     2.10 cm   LV E/e' lateral: 5.6 LV SV:         83 LV SV Index:   45 LVOT Area:     3.46 cm  RIGHT VENTRICLE RV Basal diam:  3.00 cm RV S prime:     22.40 cm/s TAPSE (M-mode): 1.7 cm LEFT ATRIUM             Index        RIGHT ATRIUM          Index LA diam:        2.80 cm 1.53 cm/m   RA Area:     8.44 cm LA Vol (A2C):   41.2 ml 22.52 ml/m  RA Volume:   16.60 ml 9.08 ml/m LA Vol (A4C):   26.9 ml 14.71 ml/m LA Biplane Vol: 34.9 ml 19.08 ml/m  AORTIC VALVE                 PULMONIC VALVE AV Area (Vmax): 2.83 cm     PV Vmax:        1.07 m/s AV Vmax:        143.00 cm/s  PV Peak grad:   4.6 mmHg AV Peak Grad:   8.2 mmHg     RVOT Peak grad: 3 mmHg LVOT Vmax:      117.00 cm/s LVOT Vmean:     78.800 cm/s LVOT VTI:       0.239 m  AORTA Ao Root diam: 3.70 cm Ao Asc diam:  3.40 cm MITRAL VALVE               TRICUSPID VALVE MV Area (PHT): 4.83 cm    TR Peak grad:   30.5 mmHg MV Decel Time: 157 msec    TR Vmax:        276.00 cm/s MV E velocity: 99.00 cm/s MV A velocity: 87.00 cm/s  SHUNTS MV E/A ratio:  1.14        Systemic VTI:  0.24 m                            Systemic Diam: 2.10 cm Darryle Decent MD Electronically signed by Darryle Decent MD Signature Date/Time: 04/19/2023/12:50:02 PM    Final    DG Chest 2 View Result Date: 04/18/2023 CLINICAL DATA:  Cough.  Wheezing.  Shortness of breath. EXAM: CHEST -  2 VIEW COMPARISON:  03/31/2023. FINDINGS: Redemonstration of right basilar opacity with associated small right pleural effusion without significant interval change. Bilateral lung fields are otherwise clear. Left lateral costophrenic angle is clear. Stable cardio-mediastinal silhouette. No acute osseous abnormalities. The soft tissues are within normal limits. Right-sided CT Port-A-Cath is seen with its tip overlying the lower portion of superior vena cava. IMPRESSION: *Essentially stable exam. Right basilar opacity and small right pleural effusion, without significant interval change. Electronically  Signed   By: Ree Molt M.D.   On: 04/18/2023 14:21   CT Angio Chest PE W and/or Wo Contrast Result Date: 04/18/2023 CLINICAL DATA:  shortness of breath, tachycardia, hx of blood clots with missed blood thinner dose, evaluating PE vs aspiration PNA EXAM: CT ANGIOGRAPHY CHEST WITH CONTRAST TECHNIQUE: Multidetector CT imaging of the chest was performed using the standard protocol during bolus administration of intravenous contrast. Multiplanar CT image reconstructions and MIPs were obtained to evaluate the vascular anatomy. RADIATION DOSE REDUCTION: This exam was performed according to the departmental dose-optimization program which includes automated exposure control, adjustment of the mA and/or kV according to patient size and/or use of iterative reconstruction technique. CONTRAST:  75mL OMNIPAQUE  IOHEXOL  350 MG/ML SOLN COMPARISON:  CT angiography chest from 03/31/2023. FINDINGS: Cardiovascular: No evidence of embolism to the proximal subsegmental pulmonary artery level. Normal cardiac size. Redemonstration of moderate-to-large pericardial effusion without significant interval change. No aortic aneurysm. There are coronary artery calcifications, in keeping with coronary artery disease. There are also mild peripheral atherosclerotic vascular calcifications of thoracic aorta and its major branches. Mediastinum/Nodes: Visualized thyroid gland appears grossly unremarkable. No solid / cystic mediastinal masses. The esophagus is nondistended precluding optimal assessment. No axillary, mediastinal or hilar lymphadenopathy by size criteria. Lungs/Pleura: There are retained mucus secretions along the periphery of the upper trachea. There is interval increase in the peribronchovascular opacities mainly in the right lung with middle lobe/lower lobe predominance. There also slightly irregular/nodular interlobular septal thickening in the right lung. There is also irregularity along the right major fissure and minor  fissure. There are multiple ill-defined predominantly centrilobular nodules in the left lung lower lobe with interval increase when compared to the prior exam. No new dense consolidation or lung collapse. Redemonstration of small loculated right pleural effusion. No left pleural effusion. No pneumothorax. Redemonstration of pleural calcifications in the right posteroinferior hemithorax, nonspecific but commonly seen as a sequela of prior asbestos related disease. Upper Abdomen: Redemonstration of extensive, ill-defined, hypoattenuating areas in the visualized liver, compatible with metastasis. There is dilated main pancreatic duct in the body/tail region with associated atrophy of the pancreas. Remaining visualized upper abdominal viscera are within normal limits. Musculoskeletal: The visualized soft tissues of the chest wall are grossly unremarkable. No suspicious osseous lesions. There are mild multilevel degenerative changes in the visualized spine. Review of the MIP images confirms the above findings. IMPRESSION: 1. No embolism the proximal subsegmental pulmonary artery level. 2. Interval increase in the previously seen peri-bronchovascular opacities mainly in the right lung. There is also slightly irregular/nodular interlobular septal thickening in the right lung. Small loculated right pleural effusion. 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 Correlate clinically. 3. Extensive ill-defined hypoattenuating areas in the liver, compatible with metastases. 4. Moderate-to-large pericardial effusion without significant interval change. 5. Multiple other nonacute observations, as described above. Aortic Atherosclerosis (ICD10-I70.0). Electronically Signed   By: Ree Molt M.D.   On: 04/18/2023 14:19   DG Swallowing Func-Speech Pathology Result Date: 04/04/2023 Table formatting from the  original result was not included. Modified Barium Swallow Study Patient Details Name:  ZYAIR RUSSI MRN: [id number] Date of Birth: 03/23/1961 Today's Date: 04/04/2023 HPI/PMH: HPI: Pt is a 63 y.o. male with medical history significant for Metastatic pancreatic Cancer on Palliative Chemotherapy, asthma, prostate Cancer, type 2 diabetes mellitus, hypertension, dyslipidemia, and PE on Eliquis , who presented to the emergency room with acute onset of dyspnea with associated cough productive of yellow sputum without wheezing.  Today patient was found to have hemoglobin 6.5.  He agreed for blood transfusion however during transfusion he developed worsening shortness of breath with tachycardia.  Transfusion was aborted and blood return to blood bank for transfusion reaction workup.  Patient has also been seen by cardiologist and awaiting repeat echo before decision is made on Large pericardial effusion, lung metastasis per MD note.  Per Oncology note: Patient was hospitalized November 2024 with shortness of breath/acute PE, bilateral DVT.  CT of the abdomen and pelvis found to have a pancreatic head mass and liver masses.  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  Patient is status post 2 cycles of gemcitabine  and Abraxane .  Now admitted to the hospital with pneumonia.  Palliative Care was consulted for GOC.   Chest CT: Large pericardial effusion increased compared to 02/27/2023.  Clinical correlation recommended to exclude tamponade physiology.  3. Increased bilateral centrilobular micro nodules and tree-in-bud  opacities with more confluence opacities in the right lung, likely  due to bronchopneumonia from aspiration. Lymphangitic spread of  tumor could appear similarly.  4. Irregular pleural thickening or loculated pleural effusion in the  lower right chest has increased compared to 02/27/2023. There is  associated pleural nodularity in the posterior lower lobe. This may be due to bronchopneumonia though metastases are not excluded. Clinical Impression:  Clinical Impression: Pt presents with profound oropharyngeal dysphagia that is likely related to overall deconditioned state. As such he is at a very high risk of aspiration, malnutrition and dehydration when consuming POs. Specifically, when consuming thin liquids via spoon, nectar thick liquids via spoon and small cup sips as well as small boluses of puree, he presents with sensed and silent aspiration of all consistencies. When aspiration is sensed, he produced a delayed weak non-productive cough that was not effective in clearing aspirates or laryngeal penetrates. As such, would recommend NPO and discussion regarding GOC. Recommend extensive oral care to reduce the bacterial load of pt's salvia as his risk of aspirating his own salvia is high.     Analysis of swallow function:  Oral phase is c/b decrease propulsion of boluses over base of tongue  Pharyngeal phase is c/b decreased base of tongue strength, weak hyoid movement, decreased epiglottic deflection, delayed swallow initiation to leave of pyriform sinuses and weak pharyngeal parastasis resulting in reduced UES opening and passage of boluses as well s gross widespread pharyngeal residue throughout pharynx. Boluses were observed to be resting within the laryngeal vestibule and on pt's vocal cords with no ability to clear these penetrates even with a cued cough. Aspiration was gross when consuming thin liquids via spoon d/t delayed pharyngeal swallow. While less of the nectar and puree were aspirated, pharyngeal residue was greater resulting in continued build-up of penetrates. Factors that may increase risk of adverse event in presence of aspiration Noe & Lianne 2021): Factors that may increase risk of adverse event in presence of aspiration Noe & Lianne 2021): Poor general health and/or compromised immunity; Reduced cognitive function; Limited mobility; Frail or deconditioned;  Dependence for feeding and/or oral hygiene; Inadequate oral hygiene;  Reduced saliva; Weak cough; Frequent aspiration of large volumes Recommendations/Plan: Swallowing Evaluation Recommendations Swallowing Evaluation Recommendations Recommendations: NPO Medication Administration: Via alternative means Oral care recommendations: Oral care QID (4x/day) Recommended consults: Consider Palliative care Caregiver Recommendations: -- (TBD) Treatment Plan Treatment Plan Treatment recommendations: No treatment recommended at this time Follow-up recommendations: No SLP follow up Recommendations Comment: Palliative Care f/u for GOC Recommendations Recommendations for follow up therapy are one component of a multi-disciplinary discharge planning process, led by the attending physician.  Recommendations may be updated based on patient status, additional functional criteria and insurance authorization. Assessment: Orofacial Exam: Orofacial Exam Oral Cavity: Oral Hygiene: Xerostomia Oral Cavity - Dentition: Adequate natural dentition Orofacial Anatomy: WFL Oral Motor/Sensory Function: WFL Anatomy: Anatomy: WFL Boluses Administered: Boluses Administered Boluses Administered: Thin liquids (Level 0); Mildly thick liquids (Level 2, nectar thick); Puree  Oral Impairment Domain: Oral Impairment Domain Lip Closure: No labial escape Tongue control during bolus hold: Not tested Bolus preparation/mastication: Slow prolonged chewing/mashing with complete recollection Bolus transport/lingual motion: Delayed initiation of tongue motion (oral holding); Slow tongue motion Oral residue: Residue collection on oral structures Location of oral residue : Floor of mouth; Tongue Initiation of pharyngeal swallow : Pyriform sinuses  Pharyngeal Impairment Domain: Pharyngeal Impairment Domain Soft palate elevation: No bolus between soft palate (SP)/pharyngeal wall (PW) Laryngeal elevation: Partial superior movement of thyroid cartilage/partial approximation of arytenoids to epiglottic petiole Anterior hyoid excursion: Partial  anterior movement; No anterior movement Epiglottic movement: Partial inversion; No inversion Laryngeal vestibule closure: None, wide column air/contrast in laryngeal vestibule; Incomplete, narrow column air/contrast in laryngeal vestibule Pharyngeal stripping wave : Present - diminished Pharyngoesophageal segment opening: Minimal distention/minimal duration, marked obstruction of flow; Partial distention/partial duration, partial obstruction of flow Tongue base retraction: Narrow column of contrast or air between tongue base and PPW; Wide column of contrast or air between tongue base and PPW Pharyngeal residue: Majority of contrast within or on pharyngeal structures Location of pharyngeal residue: Tongue base; Valleculae; Pharyngeal wall; Pyriform sinuses  Esophageal Impairment Domain: Esophageal Impairment Domain Esophageal clearance upright position: Complete clearance, esophageal coating Pill: No data recorded Penetration/Aspiration Scale Score: Penetration/Aspiration Scale Score 7.  Material enters airway, passes BELOW cords and not ejected out despite cough attempt by patient: Thin liquids (Level 0); Mildly thick liquids (Level 2, nectar thick); Puree 8.  Material enters airway, passes BELOW cords without attempt by patient to eject out (silent aspiration) : Thin liquids (Level 0); Mildly thick liquids (Level 2, nectar thick); Puree Compensatory Strategies: No data recorded  General Information: Caregiver present: No  Diet Prior to this Study: Dysphagia 3 (mechanical soft); Thin liquids (Level 0)   Temperature : Normal   Respiratory Status: WFL   Supplemental O2: None (Room air)   History of Recent Intubation: No  Behavior/Cognition: Alert; Cooperative; Distractible; Requires cueing Self-Feeding Abilities: Needs assist with self-feeding Baseline vocal quality/speech: Normal Volitional Cough: Able to elicit Volitional Swallow: Able to elicit Exam Limitations: No limitations Goal Planning: Prognosis for improved  oropharyngeal function: -- (Poor) Barriers to Reach Goals: Cognitive deficits; Time post onset; Severity of deficits; Overall medical prognosis Barriers/Prognosis Comment: Frail; Drowsy; Pain/pain meds; Cancer w/ Metastasis Patient/Family Stated Goal: none stated Consulted and agree with results and recommendations: Physician; Nurse Pain: Pain Assessment Pain Assessment: Faces Faces Pain Scale: 2 Pain Location: R shoulder; BLEs Pain Descriptors / Indicators: Aching Pain Intervention(s): Monitored during session; Premedicated before session End of Session: Start Time:SLP Start Time (ACUTE  ONLY): 1430 Stop Time: SLP Stop Time (ACUTE ONLY): 1450 Time Calculation:SLP Time Calculation (min) (ACUTE ONLY): 20 min Charges: SLP Evaluations $ SLP Speech Visit: 1 Visit SLP Evaluations $BSS Swallow: 1 Procedure $MBS Swallow: 1 Procedure $Self Care/Home Management: 8-22 SLP visit diagnosis: SLP Visit Diagnosis: Dysphagia, oropharyngeal phase (R13.12) Past Medical History: Past Medical History: Diagnosis Date  Asthma   Cancer (HCC)   prostate  Diabetes mellitus without complication (HCC)   type II  Hyperlipidemia   Hypertension   Sleep apnea   uses Cpap machine  Past Surgical History: Past Surgical History: Procedure Laterality Date  CHOLECYSTECTOMY    COLONOSCOPY WITH PROPOFOL  N/A 11/18/2018  Procedure: COLONOSCOPY WITH PROPOFOL ;  Surgeon: Janalyn Keene NOVAK, MD;  Location: [facility] ENDOSCOPY;  Service: Gastroenterology;  Laterality: N/A;  COLONOSCOPY WITH PROPOFOL  N/A 02/05/2023  Procedure: COLONOSCOPY WITH PROPOFOL ;  Surgeon: Unk Corinn Skiff, MD;  Location: [facility] ENDOSCOPY;  Service: Gastroenterology;  Laterality: N/A;  IR IMAGING GUIDED PORT INSERTION  03/24/2023  IR US  LIVER BIOPSY  03/03/2023  LAPAROSCOPIC RETROPUBIC PROSTATECTOMY    2023  LUNG SURGERY    ligation of thoracic duct  PROSTATE BIOPSY N/A 09/21/2020  Procedure: PROSTATE BIOPSY GRAYCE;  Surgeon: Kassie Ozell SAUNDERS, MD;  Location: [facility] ORS;  Service: Urology;   Laterality: N/A; Happi Overton 04/04/2023, 3:25 PM  US  Venous Img Lower Bilateral (DVT) Result Date: 04/02/2023 CLINICAL DATA:  BILATERAL lower extremity pain times months. [id number] Lower extremity pain [id number] EXAM: BILATERAL LOWER EXTREMITY VENOUS DOPPLER ULTRASOUND TECHNIQUE: Gray-scale sonography with graded compression, as well as color Doppler and duplex ultrasound were performed to evaluate the lower extremity deep venous systems from the level of the common femoral vein and including the common femoral, femoral, profunda femoral, popliteal and calf veins including the posterior tibial, peroneal and gastrocnemius veins when visible. The superficial great saphenous vein was also interrogated. Spectral Doppler was utilized to evaluate flow at rest and with distal augmentation maneuvers in the common femoral, femoral and popliteal veins. COMPARISON:  Lower extremity venous duplex, most recently 03/25/2023, POSITIVE for BILATERAL lower extremity DVT of the RIGHT femoral and LEFT popliteal veins. FINDINGS: RIGHT LOWER EXTREMITY VENOUS Normal compressibility of the RIGHT common femoral and visualized portions of profunda femoral vein and great saphenous veins. Similar appearance of near-occlusive filling defect involving the RIGHT femoral, popliteal and visualized portions of the calf veins OTHER No evidence of superficial thrombophlebitis or abnormal fluid collection. Limitations: none LEFT LOWER EXTREMITY VENOUS Normal compressibility of the LEFT common femoral, superficial femoral, profunda femoral vein and great saphenous veins. Similar appearance of near-occlusive filling defect within the LEFT popliteal and visualized portions of the calf veins. OTHER No evidence of superficial thrombophlebitis or abnormal fluid collection. Limitations: none IMPRESSION: Since lower extremity venous duplex dated 03/25/2023; 1. No new or worsening DVT within either lower extremity. 2. Similar appearance of BILATERAL lower  extremity DVT, extending from the RIGHT femoral through the calf veins and LEFT popliteal through the calf veins Thom Hall, MD [facility] Electronically Signed   By: Thom Hall M.D.   On: 04/02/2023 13:38   ECHOCARDIOGRAM COMPLETE Result Date: 04/01/2023    ECHOCARDIOGRAM REPORT   Patient Name:   Tony Benson Date of Exam: 04/01/2023 Medical Rec #:  [id number]     Height:       70.0 in Accession #:    [id number]    Weight:       149.0 lb Date of Birth:  11-15-1960  BSA:          1.842 m Patient Age:    62 years      BP:           109/52 mmHg Patient Gender: M             HR:           100 bpm. Exam Location:  [facility] Procedure: 2D Echo, Cardiac Doppler and Color Doppler Indications:     Pericardial Effusion I31.3  History:         Patient has prior history of Echocardiogram examinations, most                  recent 03/04/2023. Risk Factors:Diabetes, Hypertension and                  Sleep Apnea.  Sonographer:     Christopher Furnace Referring Phys:  [id number] JAN A MANSY Diagnosing Phys: Lonni Hanson MD IMPRESSIONS  1. Left ventricular ejection fraction, by estimation, is 60 to 65%. The left ventricle has normal function. The left ventricle has no regional wall motion abnormalities. There is moderate left ventricular hypertrophy. Left ventricular diastolic parameters are consistent with Grade II diastolic dysfunction (pseudonormalization).  2. Right ventricular systolic function is normal. The right ventricular size is normal. There is mildly elevated pulmonary artery systolic pressure.  3. There is equivocal early diastolic collapse of the right ventricle. However, mitral and tricuspid valve inflow velocity respiratory variation is not consistent with tamponade physiology.. Large pericardial effusion. The pericardial effusion is circumferential.  4. The mitral valve is normal in structure. Trivial mitral valve regurgitation. No evidence of mitral  stenosis.  5. The aortic valve is tricuspid. Aortic valve regurgitation is not visualized. No aortic stenosis is present.  6. The inferior vena cava is normal in size with <50% respiratory variability, suggesting right atrial pressure of 8 mmHg. Comparison(s): A prior study was performed on 03/04/2023. The pericardial effusion appears somewhat larger. FINDINGS  Left Ventricle: Left ventricular ejection fraction, by estimation, is 60 to 65%. The left ventricle has normal function. The left ventricle has no regional wall motion abnormalities. The left ventricular internal cavity size was normal in size. There is  moderate left ventricular hypertrophy. Left ventricular diastolic parameters are consistent with Grade II diastolic dysfunction (pseudonormalization). Right Ventricle: The right ventricular size is normal. No increase in right ventricular wall thickness. Right ventricular systolic function is normal. There is mildly elevated pulmonary artery systolic pressure. The tricuspid regurgitant velocity is 3.00  m/s, and with an assumed right atrial pressure of 8 mmHg, the estimated right ventricular systolic pressure is 44.0 mmHg. Left Atrium: Left atrial size was normal in size. Right Atrium: Right atrial size was normal in size. Pericardium: There is equivocal early diastolic collapse of the right ventricle. However, mitral and tricuspid valve inflow velocity respiratory variation is not consistent with tamponade physiology. A large pericardial effusion is present. The pericardial effusion is circumferential. Mitral Valve: The mitral valve is normal in structure. Trivial mitral valve regurgitation. No evidence of mitral valve stenosis. MV peak gradient, 4.8 mmHg. The mean mitral valve gradient is 3.0 mmHg. Tricuspid Valve: The tricuspid valve is normal in structure. Tricuspid valve regurgitation is mild. Aortic Valve: The aortic valve is tricuspid. Aortic valve regurgitation is not visualized. No aortic stenosis is  present. Aortic valve mean gradient measures 4.0 mmHg. Aortic valve peak gradient measures 6.0 mmHg. Aortic valve area, by VTI measures 2.91 cm. Pulmonic Valve: The pulmonic  valve was grossly normal. Pulmonic valve regurgitation is trivial. No evidence of pulmonic stenosis. Aorta: The aortic root is normal in size and structure. Pulmonary Artery: The pulmonary artery is not well seen. Venous: The inferior vena cava is normal in size with less than 50% respiratory variability, suggesting right atrial pressure of 8 mmHg. IAS/Shunts: No atrial level shunt detected by color flow Doppler.  LEFT VENTRICLE PLAX 2D LVIDd:         4.10 cm   Diastology LVIDs:         1.80 cm   LV e' medial:    6.42 cm/s LV PW:         1.50 cm   LV E/e' medial:  13.8 LV IVS:        1.20 cm   LV e' lateral:   15.00 cm/s LVOT diam:     2.10 cm   LV E/e' lateral: 5.9 LV SV:         58 LV SV Index:   32 LVOT Area:     3.46 cm  RIGHT VENTRICLE RV Basal diam:  2.90 cm RV Mid diam:    2.50 cm RV S prime:     20.90 cm/s TAPSE (M-mode): 1.8 cm LEFT ATRIUM           Index        RIGHT ATRIUM          Index LA diam:      2.40 cm 1.30 cm/m   RA Area:     9.46 cm LA Vol (A4C): 20.3 ml 11.02 ml/m  RA Volume:   14.30 ml 7.76 ml/m  AORTIC VALVE AV Area (Vmax):    2.70 cm AV Area (Vmean):   2.44 cm AV Area (VTI):     2.91 cm AV Vmax:           122.00 cm/s AV Vmean:          90.900 cm/s AV VTI:            0.200 m AV Peak Grad:      6.0 mmHg AV Mean Grad:      4.0 mmHg LVOT Vmax:         95.00 cm/s LVOT Vmean:        64.000 cm/s LVOT VTI:          0.168 m LVOT/AV VTI ratio: 0.84  AORTA Ao Root diam: 3.05 cm MITRAL VALVE               TRICUSPID VALVE MV Area (PHT): 4.57 cm    TR Peak grad:   36.0 mmHg MV Area VTI:   2.32 cm    TR Vmax:        300.00 cm/s MV Peak grad:  4.8 mmHg MV Mean grad:  3.0 mmHg    SHUNTS MV Vmax:       1.09 m/s    Systemic VTI:  0.17 m MV Vmean:      80.1 cm/s   Systemic Diam: 2.10 cm MV Decel Time: 166 msec MV E velocity: 88.60  cm/s MV A velocity: 78.10 cm/s MV E/A ratio:  1.13 Lonni End MD Electronically signed by Lonni Hanson MD Signature Date/Time: 04/01/2023/2:19:37 PM    Final    CT Angio Chest PE W/Cm &/Or Wo Cm Result Date: 04/01/2023 CLINICAL DATA:  Cough and fever for 3 days. On chemo for pancreatic cancer. Elevated heart rate. PE suspected. EXAM: CT ANGIOGRAPHY CHEST WITH CONTRAST TECHNIQUE: Multidetector  CT imaging of the chest was performed using the standard protocol during bolus administration of intravenous contrast. Multiplanar CT image reconstructions and MIPs were obtained to evaluate the vascular anatomy. RADIATION DOSE REDUCTION: This exam was performed according to the departmental dose-optimization program which includes automated exposure control, adjustment of the mA and/or kV according to patient size and/or use of iterative reconstruction technique. CONTRAST:  75mL OMNIPAQUE  IOHEXOL  350 MG/ML SOLN COMPARISON:  Same day chest radiograph and CTA chest 04/28/2022 FINDINGS: Cardiovascular: Right IJ CVC tip at the superior cavoatrial junction. Negative for acute pulmonary embolism. Large pericardial effusion increased compared to 02/27/2023. Clinical correlation recommended to exclude tamponade physiology. Mild aortic atherosclerotic calcification. Mediastinum/Nodes: Debris layering within the posterior trachea. Esophagus is unremarkable. Surgical clips adjacent to the lower esophagus. Mediastinal and hilar adenopathy is not well evaluated. Lungs/Pleura: Diffuse bronchial wall thickening scattered mucous plugging greatest in the right lower lobe. Bilateral centrilobular micro nodules and tree-in-bud opacities with more confluence opacities in the right lung. Irregular pleural thickening or loculated pleural effusion in the lower right chest has increased compared to 02/27/2023. There is associated pleural nodularity in the posterior lower lobe. For example 14 x 9 mm nodule on series 5/image 97. Pleural  calcifications in the posterior right lung base. No pneumothorax. Upper Abdomen: Incompletely evaluated large metastasis in the right hepatic lobe appears slightly increased compared to 02/27/2023, measuring up to 14.5 cm previously 13.3 cm. Partially visualized dilated main pancreatic duct. Pancreatic head is not included in the exam. Small amount of perisplenic ascites. Musculoskeletal: No acute fracture or destructive osseous lesion. Review of the MIP images confirms the above findings. IMPRESSION: 1. Negative for acute pulmonary embolism. 2. Large pericardial effusion increased compared to 02/27/2023. Clinical correlation recommended to exclude tamponade physiology. 3. Increased bilateral centrilobular micro nodules and tree-in-bud opacities with more confluence opacities in the right lung, likely due to bronchopneumonia from aspiration. Lymphangitic spread of tumor could appear similarly. 4. Irregular pleural thickening or loculated pleural effusion in the lower right chest has increased compared to 02/27/2023. There is associated pleural nodularity in the posterior lower lobe. This may be due to bronchopneumonia though metastases are not excluded. 5. 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 Aortic Atherosclerosis (ICD10-I70.0). Electronically Signed   By: Norman Gatlin M.D.   On: 04/01/2023 00:53   DG Chest 2 View Result Date: 03/31/2023 CLINICAL DATA:  Cough and fever EXAM: CHEST - 2 VIEW COMPARISON:  Chest x-ray 03/05/2023.  Chest CT 02/27/2023. FINDINGS: There is a new right chest port with distal catheter tip projecting over the distal SVC. The cardiac silhouette appears mildly enlarged, unchanged. Small right pleural effusion persists. Patchy opacities in the right lung base have decreased, but have not completely resolved. There is a new focal nodular density in the left mid lung measuring 8 mm. The left lung is otherwise clear. There is no pneumothorax or acute fracture. IMPRESSION: 1.  Small right pleural effusion persists. 2. Patchy opacities in the right lung base have decreased, but have not completely resolved. 3. New 8 mm nodular density in the left mid lung. Recommend follow-up chest CT. Electronically Signed   By: Greig Pique M.D.   On: 03/31/2023 21:10   US  Venous Img Lower Bilateral Result Date: 03/25/2023 CLINICAL DATA:  Follow-up lower extremity DVT. History of prostate cancer. Patient is on anticoagulation. EXAM: BILATERAL LOWER EXTREMITY VENOUS DOPPLER ULTRASOUND TECHNIQUE: Gray-scale sonography with graded compression, as well as color Doppler and duplex ultrasound were performed to evaluate the lower  extremity deep venous systems from the level of the common femoral vein and including the common femoral, femoral, profunda femoral, popliteal and calf veins including the posterior tibial, peroneal and gastrocnemius veins when visible. The superficial great saphenous vein was also interrogated. Spectral Doppler was utilized to evaluate flow at rest and with distal augmentation maneuvers in the common femoral, femoral and popliteal veins. COMPARISON:  Bilateral lower extremity venous Doppler ultrasound-02/26/2023 (positive for DVT extending from right femoral vein through right tibial veins as well as the left popliteal vein through the left tibial veins) FINDINGS: RIGHT LOWER EXTREMITY Common Femoral Vein: No evidence of thrombus. Normal compressibility, respiratory phasicity and response to augmentation. Saphenofemoral Junction: No evidence of thrombus. Normal compressibility and flow on color Doppler imaging. Profunda Femoral Vein: No evidence of thrombus. Normal compressibility and flow on color Doppler imaging. Femoral Vein: There is predominantly occlusive DVT involving the proximal (image 41), mid (image 44) and distal (image 47) aspects of the right femoral vein, grossly unchanged compared to the 02/26/2023 examination. Popliteal Vein: Redemonstrated near occlusive DVT  involving the right popliteal vein (images 47 and 50), similar to the 02/26/2023 examination. Calf Veins: Redemonstrated occlusive DVT involving both paired divisions of the left posterior tibial (image 56) as well as both paired divisions of the right peroneal veins (image 58). Superficial Great Saphenous Vein: No evidence of thrombus. Normal compressibility. Other Findings:  None. _________________________________________________________ LEFT LOWER EXTREMITY Common Femoral Vein: No evidence of thrombus. Normal compressibility, respiratory phasicity and response to augmentation. Saphenofemoral Junction: No evidence of thrombus. Normal compressibility and flow on color Doppler imaging. Profunda Femoral Vein: No evidence of thrombus. Normal compressibility and flow on color Doppler imaging. Femoral Vein: No evidence of thrombus. Normal compressibility, respiratory phasicity and response to augmentation. Popliteal Vein: Redemonstrated occlusive DVT involving the left popliteal vein (images 21 and 25), unchanged compared to the 02/26/2023 examination. Calf Veins: Redemonstrated occlusive DVT involving both paired divisions of the left posterior tibial vein (images 27 and 28), unchanged compared to the 02/26/2023 examination. The left peroneal veins appear patent where imaged (image 30), improved compared to the 02/26/2023 examination. Superficial Great Saphenous Vein: No evidence of thrombus. Normal compressibility. Other Findings:  None. IMPRESSION: 1. Redemonstrated bilateral lower extremity DVT extending from the right femoral vein through the right calf veins, grossly unchanged compared to the 02/26/2023 examination. 2. Resolved left peroneal DVT. 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 Electronically Signed   By: Norleen Roulette M.D.   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 Frederic Specking, MD Guidance:  Ultrasound and fluoroscopic MEDICATIONS: 1% lidocaine  local with epinephrine  ANESTHESIA/SEDATION: Versed  0.5 mg IV; Fentanyl  25 mcg IV; Moderate Sedation Time:  30 minutes The patient was continuously monitored during the procedure by the interventional radiology nurse under my direct supervision. FLUOROSCOPY: 0 minutes, 71 seconds (3.4 mGy) COMPLICATIONS: None immediate. CONTRAST:  None. PROCEDURE: Informed consent was obtained from the patient following explanation of the procedure, risks, benefits and alternatives. The patient understands, agrees and consents for the procedure. All questions were addressed. A time out was performed. Maximal barrier sterile technique utilized including caps, mask, sterile gowns, sterile gloves, large sterile drape, hand hygiene, and 2% chlorhexidine  scrub. Under  sterile conditions and local anesthesia, right internal jugular micropuncture venous access was performed. Access was performed with ultrasound. Images were obtained for documentation of the patent right internal jugular vein. A guide wire was inserted followed by a transitional dilator. This allowed insertion of a guide wire and catheter into the IVC. Measurements were obtained from the SVC / RA junction back to the right IJ venotomy site. In the right infraclavicular chest, a subcutaneous pocket was created over the second anterior rib. This was done under sterile conditions and local anesthesia. 1% lidocaine  with epinephrine  was utilized for this. A 2.5 cm incision was made in the skin. Blunt dissection was performed to create a subcutaneous pocket over the right pectoralis major muscle. The pocket was flushed with saline vigorously. There was adequate hemostasis. The port  catheter was assembled and checked for leakage. The port catheter was secured in the pocket with two retention sutures. The tubing was tunneled subcutaneously to the right venotomy site and inserted into the SVC/RA junction through a valved peel-away sheath. Position was confirmed with fluoroscopy. Images were obtained for documentation. The patient tolerated the procedure well. No immediate complications. Incisions were closed in a two layer fashion with 4 - 0 Vicryl suture. Dermabond was applied to the skin. The port catheter was accessed, blood was aspirated followed by saline and heparin  flushes. Needle was removed. A dry sterile dressing was applied. IMPRESSION: Ultrasound and fluoroscopically guided right internal jugular single lumen power port catheter insertion. Tip in the SVC/RA junction. Catheter ready for use. Electronically Signed   By: CHRISTELLA.  Shick M.D.   On: 03/24/2023 12:17    Microbiology: Results for orders placed or performed during the hospital encounter of 04/18/23  Blood Culture (routine x 2)     Status: None (Preliminary result)   Collection Time: 04/18/23 11:34 AM   Specimen: BLOOD  Result Value Ref Range Status   Specimen Description BLOOD PORTA CATH  Final   Special Requests   Final    BLOOD BOTTLES DRAWN AEROBIC AND ANAEROBIC Blood Culture adequate volume   Culture   Final    NO GROWTH 4 DAYS Performed at [facility], [address] [place], [place] [address]    Report Status PENDING  Incomplete  Blood Culture (routine x 2)     Status: None (Preliminary result)   Collection Time: 04/18/23 11:36 AM   Specimen: BLOOD  Result Value Ref Range Status   Specimen Description BLOOD RIGHT ANTECUBITAL  Final   Special Requests   Final    BLOOD BOTTLES DRAWN AEROBIC AND ANAEROBIC Blood Culture adequate volume   Culture   Final    NO GROWTH 4 DAYS Performed at [facility], [address] [place], [place] [address]    Report Status PENDING  Incomplete   Resp panel by RT-PCR (RSV, Flu A&B, Covid) Anterior Nasal Swab     Status: None   Collection Time: 04/18/23 12:30 PM   Specimen: Anterior Nasal Swab  Result Value Ref Range Status   SARS Coronavirus 2 by RT PCR NEGATIVE NEGATIVE Final    Comment: (NOTE) SARS-CoV-2 target nucleic acids are NOT DETECTED.  The SARS-CoV-2 RNA is generally detectable in upper respiratory specimens during the acute phase of infection. The lowest concentration of SARS-CoV-2 viral copies this assay can detect is 138 copies/mL. A negative result does not preclude SARS-Cov-2 infection and should not be used as the sole basis for treatment or other patient management decisions. A negative result may occur  with  improper specimen collection/handling, submission of specimen other than nasopharyngeal swab, presence of viral mutation(s) within the areas targeted by this assay, and inadequate number of viral copies(<138 copies/mL). A negative result must be combined with clinical observations, patient history, and epidemiological information. The expected result is Negative.  Fact Sheet for Patients:  bloggercourse.com  Fact Sheet for Healthcare Providers:  seriousbroker.it  This test is no t yet approved or cleared by the [place]  FDA and  has been authorized for detection and/or diagnosis of SARS-CoV-2 by FDA under an Emergency Use Authorization (EUA). This EUA will remain  in effect (meaning this test can be used) for the duration of the COVID-19 declaration under Section 564(b)(1) of the Act, 21 U.S.C.section 360bbb-3(b)(1), unless the authorization is terminated  or revoked sooner.       Influenza A by PCR NEGATIVE NEGATIVE Final   Influenza B by PCR NEGATIVE NEGATIVE Final    Comment: (NOTE) The Xpert Xpress SARS-CoV-2/FLU/RSV plus assay is intended as an aid in the diagnosis of influenza from Nasopharyngeal swab specimens and should not be used  as a sole basis for treatment. Nasal washings and aspirates are unacceptable for Xpert Xpress SARS-CoV-2/FLU/RSV testing.  Fact Sheet for Patients: bloggercourse.com  Fact Sheet for Healthcare Providers: seriousbroker.it  This test is not yet approved or cleared by the [place]  FDA and has been authorized for detection and/or diagnosis of SARS-CoV-2 by FDA under an Emergency Use Authorization (EUA). This EUA will remain in effect (meaning this test can be used) for the duration of the COVID-19 declaration under Section 564(b)(1) of the Act, 21 U.S.C. section 360bbb-3(b)(1), unless the authorization is terminated or revoked.     Resp Syncytial Virus by PCR NEGATIVE NEGATIVE Final    Comment: (NOTE) Fact Sheet for Patients: bloggercourse.com  Fact Sheet for Healthcare Providers: seriousbroker.it  This test is not yet approved or cleared by the [place]  FDA and has been authorized for detection and/or diagnosis of SARS-CoV-2 by FDA under an Emergency Use Authorization (EUA). This EUA will remain in effect (meaning this test can be used) for the duration of the COVID-19 declaration under Section 564(b)(1) of the Act, 21 U.S.C. section 360bbb-3(b)(1), unless the authorization is terminated or revoked.  Performed at [facility], [address] [place], [place] [address]   MRSA Next Gen by PCR, Nasal     Status: None   Collection Time: 04/18/23  5:46 PM   Specimen: Nasal Mucosa; Nasal Swab  Result Value Ref Range Status   MRSA by PCR Next Gen NOT DETECTED NOT DETECTED Final    Comment: (NOTE) The GeneXpert MRSA Assay (FDA approved for NASAL specimens only), is one component of a comprehensive MRSA colonization surveillance program. It is not intended to diagnose MRSA infection nor to guide or monitor treatment for MRSA infections. Test performance is not FDA  approved in patients less than 3 years old. Performed at [facility], [address] [place], [place] [address]     Labs: CBC: Recent Labs  Lab 04/17/23 1315 04/18/23 1135 04/19/23 0500 04/20/23 0328 04/21/23 0434 04/22/23 0400  WBC 0.6* 0.6* 0.4* 1.4* 16.0* 29.3*  NEUTROABS 0.5* 0.4*  --  0.8* 13.2* 24.1*  HGB 8.6* 8.5* 7.2* 7.9* 7.8* 7.7*  HCT 28.8* 29.0* 24.4* 26.8* 26.8* 27.3*  MCV 85.0 85.5 85.9 85.1 86.7 88.3  PLT 152 131* 118* 145* 166 182   Basic Metabolic Panel: Recent Labs  Lab 04/18/23 1135 04/19/23 0500 04/20/23 0328 04/21/23  0434 04/22/23 0400  NA 144 149* 149* 149* 150*  K 3.6 3.5 3.4* 4.1 4.3  CL 115* 120* 119* 121* 121*  CO2 17* 23 21* 20* 23  GLUCOSE 226* 187* 162* 245* 304*  BUN 32* 26* 22 32* 40*  CREATININE 0.80 0.62 0.70 0.76 0.73  CALCIUM  10.2 9.8 10.1 9.9 10.0  MG  --  2.2 2.0 1.9 2.1  PHOS  --   --  1.3* 2.0* 1.5*   Liver Function Tests: Recent Labs  Lab 04/17/23 1315 04/18/23 0926 04/18/23 1135 04/19/23 0500  AST 27 25 36 16  ALT 23 21 24 16   ALKPHOS 794* 664* 765* 537*  BILITOT 1.1 1.1 1.2 0.8  PROT 6.3* 5.9* 6.1* 5.3*  ALBUMIN  1.9* 1.8* 1.8* 2.0*   CBG: Recent Labs  Lab 04/21/23 1105 04/21/23 1600 04/21/23 2122 04/22/23 0731 04/22/23 1124  GLUCAP 185* 154* 184* 297* 291*    Discharge time spent: greater than 30 minutes.  Signed: [name], MD [facility] 04/22/2023

## 2023-04-22 NOTE — Progress Notes (Addendum)
 Progress Note    Tony Benson  FMW:[id number] DOB: [birth year]  DOA: 04/18/2023 PCP: Weyman Bright, MD      Brief Narrative:    Medical records reviewed and are as summarized below:  Tony Benson is a 63 y.o. male with medical history significant of metastatic pancreatic cancer on palliative chemotherapy, asthma, prostate cancer, IDDM, HTN, HLD, PE on Eliquis , presented with worsening of cough and shortness of breath.   Patient has had multiple aspiration pneumonia since November last year.  He has been following with speech therapy who did a barium swallow recently showed patient has silent aspiration with increased aspiration pneumonia risk.  His wife reported that patient has coughing and choking episodes after eating or drinking.  He was admitted to the hospital for SIRS secondary to aspiration pneumonia and severe neutropenia.     Assessment/Plan:   Principal Problem:   Sepsis (HCC) Active Problems:   Pancreatic cancer metastasized to liver (HCC)   Aspiration pneumonia (HCC)   Acute metabolic encephalopathy   Body mass index is 20.47 kg/m.    Sepsis secondary to aspiration pneumonia: Continue IV cefepime  and Flagyl .  No growth on blood cultures thus far.  MRSA screen was negative.   Consolidation in right hemithorax: Right-sided thoracentesis could not be performed on 04/21/2023 because ultrasound of the chest did not show any drainable fluid collection. Continue chest physiotherapy, Robinul , scopolamine  patch, N-acetylcysteine . Large right pleural effusion has been ruled out.   Acute hypoxic respiratory failure: He is on 4 L/min oxygen  but oxygen  saturation on room air has been adequate.  Wean off oxygen  as able.   Acute metabolic encephalopathy: Patient has become more lethargic and unable to use hand gestures for communication.   Severe neutropenia: Resolved. Worsening leukocytosis likely secondary to recent use of filgrastim    Dysphagia, severe  malnutrition: Continue enteral nutrition.   Worsening hypernatremia: Hold IV fluids to avoid fluid overload. Increase free water  flushes via NG tube from every 4 hours to every 2 hours for today.   Hypokalemia: Improved   Large pericardial effusion: No evidence of tamponade.  Repeat 2D echo on 04/19/2023 showed large pericardial effusion without cardiac tamponade, EF estimated at 60 to 65%. Repeat 2D on 04/21/2023 echo showed persistent large pericardial effusion   Metastatic pancreatic cancer with metastasis to the liver: S/p gemcitabine  and Abraxane  on 04/11/2023.  Analgesics as needed for pain.  Prognosis is poor. Follow-up with oncologist and palliative care   Anemia of chronic disease: H&H stable.  No indication for blood transfusion at this time.     Type II DM with hyperglycemia: Resting hyperglycemia likely due to enteral nutrition.  Start insulin  glargine.  NovoLog  as needed for hyperglycemia.   History of DVT and pulmonary embolism: Eliquis  has been held   Stage II sacral decubitus ulcer: Present on admission.  Continue local wound care.   General weakness: PT and OT evaluation   Plan of care was discussed with his wife and daughter at the bedside.  They understand the prognosis is poor.  Follow-up with palliative care team.    ADDENDUM  Patient's family met with Sidra, NP, with palliative care team.  They also discussed diagnoses and prognosis with Dr. Rennie, oncologist.  They have decided to proceed with comfort care.  I called Tony Benson to discuss comfort care measures and what to expect.  She understands that antibiotics, chronic medications, enteral nutrition and diagnostic studies including but not limited to blood testing and imaging  will be discontinued.  Transfer to MedSurg. Mrs. Heeney was appreciative of the care her husband has received thus far.    Diet Order             Diet NPO time specified Except for: Sips with Meds  Diet  effective now                            Consultants: Oncologist Palliative care  Procedures: None    Medications:    acetylcysteine   4 mL Nebulization BID   Chlorhexidine  Gluconate Cloth  6 each Topical Daily   feeding supplement (PROSource TF20)  60 mL Per Tube Daily   fentaNYL   1 patch Transdermal Q72H   free water   200 mL Per Tube Q4H   heparin  injection (subcutaneous)  5,000 Units Subcutaneous Q8H   insulin  aspart  0-15 Units Subcutaneous TID WC   insulin  aspart  0-5 Units Subcutaneous QHS   ipratropium-albuterol   3 mL Nebulization QID   mometasone -formoterol   2 puff Inhalation BID   multivitamin with minerals  1 tablet Per Tube Daily   nutrition supplement (JUVEN)  1 packet Per Tube BID BM   OLANZapine  zydis  5 mg Oral QHS   mouth rinse  15 mL Mouth Rinse 4 times per day   phosphorus  500 mg Per Tube TID   scopolamine   1 patch Transdermal Q72H   thiamine   100 mg Per Tube Daily   Continuous Infusions:  ceFEPime  (MAXIPIME ) IV Stopped (04/22/23 9161)   feeding supplement (OSMOLITE 1.5 CAL) 65 mL/hr at 04/22/23 1200   metronidazole  Stopped (04/22/23 1047)     Anti-infectives (From admission, onward)    Start     Dose/Rate Route Frequency Ordered Stop   04/19/23 0000  ceFEPIme  (MAXIPIME ) 2 g in sodium chloride  0.9 % 100 mL IVPB        2 g 200 mL/hr over 30 Minutes Intravenous Every 8 hours 04/18/23 1615     04/18/23 1600  metroNIDAZOLE  (FLAGYL ) IVPB 500 mg        500 mg 100 mL/hr over 60 Minutes Intravenous Every 12 hours 04/18/23 1555     04/18/23 1500  ceFEPIme  (MAXIPIME ) 2 g in sodium chloride  0.9 % 100 mL IVPB        2 g 200 mL/hr over 30 Minutes Intravenous  Once 04/18/23 1456 04/18/23 1627   04/18/23 1500  vancomycin  (VANCOCIN ) IVPB 1000 mg/200 mL premix        1,000 mg 200 mL/hr over 60 Minutes Intravenous  Once 04/18/23 1456 04/18/23 2151   04/18/23 1500  azithromycin  (ZITHROMAX ) 500 mg in sodium chloride  0.9 % 250 mL IVPB        500  mg 250 mL/hr over 60 Minutes Intravenous  Once 04/18/23 1456 04/18/23 1708              Family Communication/Anticipated D/C date and plan/Code Status   DVT prophylaxis: heparin  injection 5,000 Units Start: 04/20/23 1400 Place TED hose Start: 04/18/23 1725     Code Status: Do not attempt resuscitation (DNR) - Comfort care  Family Communication: Plan discussed with his wife and daughter at the bedside Disposition Plan: To be determined   Status is: Inpatient Remains inpatient appropriate because: Sepsis from pneumonia       Subjective:   Interval events noted.  Patient is nonverbal and unable to provide any history.  Sonny, RN, and occupational therapists were at the bedside.  His  wife and daughter also walked in during this encounter.  Objective:    Vitals:   04/22/23 1000 04/22/23 1100 04/22/23 1108 04/22/23 1200  BP: 113/86 (!) 94/56  114/62  Pulse: (!) 121 (!) 108  (!) 110  Resp: (!) 24 20  20   Temp:      TempSrc:      SpO2: 95% 99% 100% 100%  Weight:      Height:       No data found.   Intake/Output Summary (Last 24 hours) at 04/22/2023 1250 Last data filed at 04/22/2023 1200 Gross per 24 hour  Intake 2463.84 ml  Output 610 ml  Net 1853.84 ml   Filed Weights   04/20/23 0350 04/21/23 0500 04/22/23 0427  Weight: 64.5 kg 65.8 kg 64.7 kg    Exam:  GEN: NAD, ill-looking, frail, cachectic SKIN: Warm and dry EYES: No pallor or icterus ENT: MMM CV: RRR, tachycardic PULM: CTA B ABD: soft, ND, NT, +BS CNS: More lethargic today, he is unable to use hand gestures to communicate like he did yesterday. EXT: B/l leg edema, no tenderness or erythema      Data Reviewed:   I have personally reviewed following labs and imaging studies:  Labs: Labs show the following:   Basic Metabolic Panel: Recent Labs  Lab 04/15/23 1442 04/17/23 1315 04/18/23 1135 04/19/23 0500 04/20/23 0328 04/21/23 0434 04/22/23 0400  NA 141   < > 144 149* 149* 149*  150*  K 4.6   < > 3.6 3.5 3.4* 4.1 4.3  CL 109   < > 115* 120* 119* 121* 121*  CO2 22   < > 17* 23 21* 20* 23  GLUCOSE 136*   < > 226* 187* 162* 245* 304*  BUN 28*   < > 32* 26* 22 32* 40*  CREATININE 0.70   < > 0.80 0.62 0.70 0.76 0.73  CALCIUM  10.4*   < > 10.2 9.8 10.1 9.9 10.0  MG 2.3  --   --  2.2 2.0 1.9 2.1  PHOS  --   --   --   --  1.3* 2.0* 1.5*   < > = values in this interval not displayed.   GFR Estimated Creatinine Clearance: 87.6 mL/min (by C-G formula based on SCr of 0.73 mg/dL). Liver Function Tests: Recent Labs  Lab 04/15/23 1442 04/17/23 1315 04/18/23 0926 04/18/23 1135 04/19/23 0500  AST 38 27 25 36 16  ALT 28 23 21 24 16   ALKPHOS 713* 794* 664* 765* 537*  BILITOT 1.0 1.1 1.1 1.2 0.8  PROT 6.2* 6.3* 5.9* 6.1* 5.3*  ALBUMIN  1.9* 1.9* 1.8* 1.8* 2.0*   No results for input(s): LIPASE, AMYLASE in the last 168 hours. No results for input(s): AMMONIA in the last 168 hours. Coagulation profile Recent Labs  Lab 04/18/23 1135  INR 2.1*    CBC: Recent Labs  Lab 04/17/23 1315 04/18/23 1135 04/19/23 0500 04/20/23 0328 04/21/23 0434 04/22/23 0400  WBC 0.6* 0.6* 0.4* 1.4* 16.0* 29.3*  NEUTROABS 0.5* 0.4*  --  0.8* 13.2* 24.1*  HGB 8.6* 8.5* 7.2* 7.9* 7.8* 7.7*  HCT 28.8* 29.0* 24.4* 26.8* 26.8* 27.3*  MCV 85.0 85.5 85.9 85.1 86.7 88.3  PLT 152 131* 118* 145* 166 182   Cardiac Enzymes: No results for input(s): CKTOTAL, CKMB, CKMBINDEX, TROPONINI in the last 168 hours. BNP (last 3 results) No results for input(s): PROBNP in the last 8760 hours. CBG: Recent Labs  Lab 04/21/23 1105 04/21/23 1600 04/21/23 2122 04/22/23  9268 04/22/23 1124  GLUCAP 185* 154* 184* 297* 291*   D-Dimer: No results for input(s): DDIMER in the last 72 hours. Hgb A1c: No results for input(s): HGBA1C in the last 72 hours. Lipid Profile: No results for input(s): CHOL, HDL, LDLCALC, TRIG, CHOLHDL, LDLDIRECT in the last 72 hours. Thyroid  function studies: No results for input(s): TSH, T4TOTAL, T3FREE, THYROIDAB in the last 72 hours.  Invalid input(s): FREET3 Anemia work up: No results for input(s): VITAMINB12, FOLATE, FERRITIN, TIBC, IRON, RETICCTPCT in the last 72 hours. Sepsis Labs: Recent Labs  Lab 04/18/23 1135 04/19/23 0500 04/20/23 0328 04/21/23 0434 04/22/23 0400  WBC 0.6* 0.4* 1.4* 16.0* 29.3*  LATICACIDVEN 1.8  --   --   --   --     Microbiology Recent Results (from the past 240 hours)  Blood Culture (routine x 2)     Status: None (Preliminary result)   Collection Time: 04/18/23 11:34 AM   Specimen: BLOOD  Result Value Ref Range Status   Specimen Description BLOOD PORTA CATH  Final   Special Requests   Final    BLOOD BOTTLES DRAWN AEROBIC AND ANAEROBIC Blood Culture adequate volume   Culture   Final    NO GROWTH 4 DAYS Performed at [facility], [address] [place], [place] [address]    Report Status PENDING  Incomplete  Blood Culture (routine x 2)     Status: None (Preliminary result)   Collection Time: 04/18/23 11:36 AM   Specimen: BLOOD  Result Value Ref Range Status   Specimen Description BLOOD RIGHT ANTECUBITAL  Final   Special Requests   Final    BLOOD BOTTLES DRAWN AEROBIC AND ANAEROBIC Blood Culture adequate volume   Culture   Final    NO GROWTH 4 DAYS Performed at [facility], [address] [place], [place] [address]    Report Status PENDING  Incomplete  Resp panel by RT-PCR (RSV, Flu A&B, Covid) Anterior Nasal Swab     Status: None   Collection Time: 04/18/23 12:30 PM   Specimen: Anterior Nasal Swab  Result Value Ref Range Status   SARS Coronavirus 2 by RT PCR NEGATIVE NEGATIVE Final    Comment: (NOTE) SARS-CoV-2 target nucleic acids are NOT DETECTED.  The SARS-CoV-2 RNA is generally detectable in upper respiratory specimens during the acute phase of infection. The lowest concentration of SARS-CoV-2 viral copies this assay can  detect is 138 copies/mL. A negative result does not preclude SARS-Cov-2 infection and should not be used as the sole basis for treatment or other patient management decisions. A negative result may occur with  improper specimen collection/handling, submission of specimen other than nasopharyngeal swab, presence of viral mutation(s) within the areas targeted by this assay, and inadequate number of viral copies(<138 copies/mL). A negative result must be combined with clinical observations, patient history, and epidemiological information. The expected result is Negative.  Fact Sheet for Patients:  bloggercourse.com  Fact Sheet for Healthcare Providers:  seriousbroker.it  This test is no t yet approved or cleared by the [place]  FDA and  has been authorized for detection and/or diagnosis of SARS-CoV-2 by FDA under an Emergency Use Authorization (EUA). This EUA will remain  in effect (meaning this test can be used) for the duration of the COVID-19 declaration under Section 564(b)(1) of the Act, 21 U.S.C.section 360bbb-3(b)(1), unless the authorization is terminated  or revoked sooner.       Influenza A by PCR NEGATIVE NEGATIVE Final   Influenza B  by PCR NEGATIVE NEGATIVE Final    Comment: (NOTE) The Xpert Xpress SARS-CoV-2/FLU/RSV plus assay is intended as an aid in the diagnosis of influenza from Nasopharyngeal swab specimens and should not be used as a sole basis for treatment. Nasal washings and aspirates are unacceptable for Xpert Xpress SARS-CoV-2/FLU/RSV testing.  Fact Sheet for Patients: bloggercourse.com  Fact Sheet for Healthcare Providers: seriousbroker.it  This test is not yet approved or cleared by the [place]  FDA and has been authorized for detection and/or diagnosis of SARS-CoV-2 by FDA under an Emergency Use Authorization (EUA). This EUA will remain in  effect (meaning this test can be used) for the duration of the COVID-19 declaration under Section 564(b)(1) of the Act, 21 U.S.C. section 360bbb-3(b)(1), unless the authorization is terminated or revoked.     Resp Syncytial Virus by PCR NEGATIVE NEGATIVE Final    Comment: (NOTE) Fact Sheet for Patients: bloggercourse.com  Fact Sheet for Healthcare Providers: seriousbroker.it  This test is not yet approved or cleared by the [place]  FDA and has been authorized for detection and/or diagnosis of SARS-CoV-2 by FDA under an Emergency Use Authorization (EUA). This EUA will remain in effect (meaning this test can be used) for the duration of the COVID-19 declaration under Section 564(b)(1) of the Act, 21 U.S.C. section 360bbb-3(b)(1), unless the authorization is terminated or revoked.  Performed at [facility], [address] [place], [place] [address]   MRSA Next Gen by PCR, Nasal     Status: None   Collection Time: 04/18/23  5:46 PM   Specimen: Nasal Mucosa; Nasal Swab  Result Value Ref Range Status   MRSA by PCR Next Gen NOT DETECTED NOT DETECTED Final    Comment: (NOTE) The GeneXpert MRSA Assay (FDA approved for NASAL specimens only), is one component of a comprehensive MRSA colonization surveillance program. It is not intended to diagnose MRSA infection nor to guide or monitor treatment for MRSA infections. Test performance is not FDA approved in patients less than 34 years old. Performed at [facility], [address] [place], [place] [address]     Procedures and diagnostic studies:  US  CHEST (PLEURAL EFFUSION) Result Date: 04/21/2023 CLINICAL DATA:  Interventional Radiology asked to assess the patient for possible right-sided thoracentesis. EXAM: CHEST ULTRASOUND COMPARISON:  DG chest April 20, 2023 FINDINGS: Limited ultrasound evaluation of the right lung revealed consolidated lung tissue with  scattered areas of fluid. No drainable collections to target. No procedure performed. IMPRESSION: Limited ultrasound evaluation of the right lung revealed scattered areas of fluid with no drainable collections to target. No procedure performed in the image findings were discussed with the patient's family. Ultrasound images reviewed by Warren Dais NP. Electronically Signed   By: Marcey Moan M.D.   On: 04/21/2023 17:20   ECHOCARDIOGRAM LIMITED Result Date: 04/21/2023    ECHOCARDIOGRAM LIMITED REPORT   Patient Name:   ENGELBERT SEVIN Date of Exam: 04/21/2023 Medical Rec #:  [id number]     Height:       70.0 in Accession #:    [id number]    Weight:       145.1 lb Date of Birth:  12-10-1960    BSA:          1.821 m Patient Age:    62 years      BP:           113/63 mmHg Patient Gender: M             HR:  127 bpm. Exam Location:  [facility] Procedure: Limited Echo, Cardiac Doppler and Color Doppler Indications:     Pericardial Effusion I31.3  History:         Patient has prior history of Echocardiogram examinations, most                  recent 04/19/2023. Risk Factors:Hypertension and Diabetes.  Sonographer:     Christopher Furnace Referring Phys:  [id number] DARRYLE NED O'NEAL Diagnosing Phys: Deatrice Cage MD IMPRESSIONS  1. Left ventricular ejection fraction, by estimation, is 55 to 60%. The left ventricle has normal function. The left ventricle has no regional wall motion abnormalities. Left ventricular diastolic parameters are consistent with Grade I diastolic dysfunction (impaired relaxation).  2. Right ventricular systolic function is normal. The right ventricular size is normal. Tricuspid regurgitation signal is inadequate for assessing PA pressure.  3. Large pericardial effusion. The pericardial effusion is circumferential. No Rv diastolic collapse.  4. The mitral valve is normal in structure. No evidence of mitral valve regurgitation. No evidence of mitral stenosis.  5. The aortic valve is normal in structure.  Aortic valve regurgitation is not visualized. No aortic stenosis is present.  6. The inferior vena cava is dilated in size with >50% respiratory variability, suggesting right atrial pressure of 8 mmHg. Comparison(s): A prior study was performed on 04/19/2023. No significant change from prior study. FINDINGS  Left Ventricle: Left ventricular ejection fraction, by estimation, is 55 to 60%. The left ventricle has normal function. The left ventricle has no regional wall motion abnormalities. The left ventricular internal cavity size was normal in size. There is  no left ventricular hypertrophy. Left ventricular diastolic parameters are consistent with Grade I diastolic dysfunction (impaired relaxation). Right Ventricle: The right ventricular size is normal. No increase in right ventricular wall thickness. Right ventricular systolic function is normal. Tricuspid regurgitation signal is inadequate for assessing PA pressure. The tricuspid regurgitant velocity is 2.23 m/s, and with an assumed right atrial pressure of 8 mmHg, the estimated right ventricular systolic pressure is 27.9 mmHg. Left Atrium: Left atrial size was normal in size. Right Atrium: Right atrial size was normal in size. Pericardium: A large pericardial effusion is present. The pericardial effusion is circumferential. Mitral Valve: The mitral valve is normal in structure. No evidence of mitral valve stenosis. Tricuspid Valve: The tricuspid valve is normal in structure. Tricuspid valve regurgitation is not demonstrated. No evidence of tricuspid stenosis. Aortic Valve: The aortic valve is normal in structure. Aortic valve regurgitation is not visualized. No aortic stenosis is present. Pulmonic Valve: The pulmonic valve was normal in structure. Pulmonic valve regurgitation is not visualized. No evidence of pulmonic stenosis. Aorta: The aortic root is normal in size and structure. Venous: The inferior vena cava is dilated in size with greater than 50%  respiratory variability, suggesting right atrial pressure of 8 mmHg. IAS/Shunts: No atrial level shunt detected by color flow Doppler. Additional Comments: Spectral Doppler performed. Color Doppler performed.  LEFT VENTRICLE PLAX 2D LVIDd:         4.40 cm Diastology LVIDs:         2.80 cm LV e' medial:    9.36 cm/s LV PW:         1.20 cm LV E/e' medial:  7.7 LV IVS:        1.10 cm LV e' lateral:   20.50 cm/s  LV E/e' lateral: 3.5  RIGHT VENTRICLE RV Basal diam:  2.40 cm RV Mid diam:    2.20 cm RV S prime:     20.00 cm/s TAPSE (M-mode): 1.7 cm LEFT ATRIUM         Index       RIGHT ATRIUM          Index LA diam:    3.50 cm 1.92 cm/m  RA Area:     7.69 cm                                 RA Volume:   10.80 ml 5.93 ml/m  MITRAL VALVE               TRICUSPID VALVE MV Area (PHT): 9.03 cm    TR Peak grad:   19.9 mmHg MV Decel Time: 84 msec     TR Vmax:        223.00 cm/s MV E velocity: 71.80 cm/s MV A velocity: 87.50 cm/s MV E/A ratio:  0.82 Deatrice Cage MD Electronically signed by Deatrice Cage MD Signature Date/Time: 04/21/2023/3:22:19 PM    Final                LOS: 4 days   Macaiah Mangal  [facility]   Pager on [email or web address]. If 7PM-7AM, please contact night-coverage at [email or web address]     04/22/2023, 12:50 PM

## 2023-04-22 NOTE — TOC Transition Note (Signed)
 Transition of Care [facility]) - Discharge Note   Patient Details  Name: Tony Benson MRN: [id number] Date of Birth: 01/17/1961  Transition of Care [facility] CM/SW Contact:  Lauraine JAYSON Carpen, LCSW Phone Number: 04/22/2023, 4:18 PM   Clinical Narrative:  Patient has orders to discharge to the [facility] today. Transport paperwork is on the chart as well as DNR that needs to be signed. CSW sent secure chat to MD to notify. No further concerns. CSW signing off.   Final next level of care: Hospice Medical Facility Barriers to Discharge: No Barriers Identified   Patient Goals and CMS Choice            Discharge Placement                Patient to be transferred to facility by: EMS   Patient and family notified of of transfer: 04/22/23  Discharge Plan and Services Additional resources added to the After Visit Summary for                                       Social Drivers of Health (SDOH) Interventions SDOH Screenings   Food Insecurity: No Food Insecurity (04/18/2023)  Housing: Unknown (04/18/2023)  Transportation Needs: No Transportation Needs (04/18/2023)  Utilities: Not At Risk (04/18/2023)  Depression (PHQ2-9): Low Risk  (02/24/2023)  Tobacco Use: Low Risk  (04/17/2023)     Readmission Risk Interventions    04/04/2023    9:56 AM  Readmission Risk Prevention Plan  Transportation Screening Complete  HRI or Home Care Consult Complete  Social Work Consult for Recovery Care Planning/Counseling Complete  Palliative Care Screening Complete  Medication Review Oceanographer) Complete

## 2023-04-22 NOTE — Evaluation (Signed)
 Occupational Therapy Evaluation Patient Details Name: Tony Benson MRN: [id number] DOB: February 10, 1961 Today's Date: 04/22/2023   History of Present Illness Tony Benson is a 63 y.o. male with medical history significant of metastatic pancreatic cancer on palliative chemotherapy, asthma, prostate cancer, IDDM, HTN, HLD, PE on Eliquis , presented with worsening of cough and shortness of breath. Pt was recently hospitalized 03/31/2023 to 04/05/2023 with aspiration pneumonia.  Patient is now readmitted 04/18/2023 with same.   Clinical Impression   Tony Benson was seen for OT/PT co-evaluation this date. Prior to hospital admission, pt was residing at home with his spouse. Pt remains generally lethargic, does not vocalize or meaningfully communicate during session. MD/RN in room during session and aware. Pt spouse and dtr at bedside and provide information on PLOF/home set up. Per pt spouse, he was able to perform functional mobility without a device PTA, denies falls history in the last six months. Per pt spouse, pt required some assistance with LB dressing tasks and SBA for bathing, but otherwise tried to remain as independent as possible. Pt presents to acute OT demonstrating impaired ADL performance and functional mobility 2/2 decreased cognition, decreased activity tolerance, and generalized weakness (See OT problem list for additional functional deficits). Pt currently requires TOTAL A +2 for all bed mobility and ADL management at bed level. Pt would benefit from skilled OT services to address noted impairments and functional limitations (see below for any additional details) in order to maximize safety and independence while minimizing falls risk and caregiver burden. Anticipate the need for follow up OT services upon acute hospital DC.        If plan is discharge home, recommend the following: Assistance with cooking/housework;Assist for transportation;Help with stairs or ramp for entrance;Two people to  help with bathing/dressing/bathroom;Two people to help with walking and/or transfers;Assistance with feeding;Direct supervision/assist for financial management;Direct supervision/assist for medications management;Supervision due to cognitive status    Functional Status Assessment  Patient has had a recent decline in their functional status and/or demonstrates limited ability to make significant improvements in function in a reasonable and predictable amount of time  Equipment Recommendations  Hoyer lift;Hospital bed    Recommendations for Other Services       Precautions / Restrictions Precautions Precautions: Fall Restrictions Weight Bearing Restrictions Per Provider Order: No      Mobility Bed Mobility Overal bed mobility: Needs Assistance Bed Mobility: Rolling Rolling: Total assist, +2 for physical assistance         General bed mobility comments: Is able to hold onto bed railing to hold himself in side lying position during care.    Transfers                   General transfer comment: NT, unsafe/unable to attempt this date.      Balance                                           ADL either performed or assessed with clinical judgement   ADL Overall ADL's : Needs assistance/impaired Eating/Feeding: Total assistance;Bed level;NPO Eating/Feeding Details (indicate cue type and reason): Currently NPO except for sips with meds, pt on G tube for feeds. Is unable to meanignfully grasp/bring hands to mouth functionally during session without total assist.                 Lower Body Dressing: Bed level;+2  for physical assistance;Total assistance Lower Body Dressing Details (indicate cue type and reason): Pt incontinent of bowel during session. Requires +2 total care for bed-level peri-hygiene/bedding change.             Functional mobility during ADLs: Total assistance;+2 for physical assistance       Vision Baseline Vision/History:  1 Wears glasses Ability to See in Adequate Light: 1 Impaired       Perception         Praxis         Pertinent Vitals/Pain Pain Assessment Pain Assessment: PAINAD Breathing: normal Negative Vocalization: none Facial Expression: smiling or inexpressive Body Language: relaxed Consolability: no need to console PAINAD Score: 0 Pain Intervention(s): Limited activity within patient's tolerance, Monitored during session, Repositioned     Extremity/Trunk Assessment Upper Extremity Assessment Upper Extremity Assessment: Generalized weakness;Difficult to assess due to impaired cognition (Increased tone/catching appreciated with PROM of BUE L>R this date. Full ROM achieveable with assistance however, pt is not able to follow VCs to participate in formal assessment.)   Lower Extremity Assessment Lower Extremity Assessment: Difficult to assess due to impaired cognition;Defer to PT evaluation       Communication Communication Communication: Difficulty following commands/understanding;Difficulty communicating thoughts/reduced clarity of speech Cueing Techniques: Verbal cues;Gestural cues;Tactile cues   Cognition Arousal: Stuporous Behavior During Therapy: WFL for tasks assessed/performed Overall Cognitive Status: Impaired/Different from baseline                                 General Comments: Pt does not verbialize or meaningfully communicate during session. Per nsg/family had been using head nods or hand gestures to make needs known earlier in the morning. Requries constant stimulation/cueing to keep eyes open during session. Does not follow any VCs. Does briefly participate in care by holding onto bed railing during bed mobility, but otherwise limited meaningful participation appreciated. Per family, generally awake/alert, follows commands at baseline.     General Comments       Exercises Other Exercises Other Exercises: Pt education limited 2/2 impaired cognition.  Caregivers educated on role of OT in acute setting.   Shoulder Instructions      Home Living Family/patient expects to be discharged to:: Private residence Living Arrangements: Spouse/significant other Available Help at Discharge: Family Type of Home: House Home Access: Level entry     Home Layout: Two level;Able to live on main level with bedroom/bathroom Alternate Level Stairs-Number of Steps: flight   Bathroom Shower/Tub: Producer, Television/film/video: Standard     Home Equipment: Shower seat - built [employer]raiser (2 wheels);BSC/3in1          Prior Functioning/Environment Prior Level of Function : Needs assist             Mobility Comments: walking without device most of the time. no falls ADLs Comments: stand by/supervision provided for showering. spouse assist with lower body dressing        OT Problem List: Decreased strength;Decreased coordination;Cardiopulmonary status limiting activity;Decreased range of motion;Decreased cognition;Decreased activity tolerance;Decreased safety awareness;Impaired balance (sitting and/or standing);Decreased knowledge of use of DME or AE;Impaired UE functional use      OT Treatment/Interventions: Self-care/ADL training;Therapeutic exercise;Therapeutic activities;Neuromuscular education;Cognitive remediation/compensation;Energy conservation;Patient/family education;DME and/or AE instruction;Balance training;Manual therapy    OT Goals(Current goals can be found in the care plan section) Acute Rehab OT Goals OT Goal Formulation: Patient unable to participate in goal setting Time For Goal Achievement:  05/06/23 ADL Goals Pt Will Perform Grooming: sitting;with min assist Pt Will Perform Lower Body Dressing: with mod assist;with adaptive equipment;sitting/lateral leans Pt Will Transfer to Toilet: bedside commode;stand pivot transfer;with mod assist Pt Will Perform Toileting - Clothing Manipulation and hygiene: with mod  assist;with adaptive equipment;sitting/lateral leans;sit to/from stand  OT Frequency: Min 1X/week    Co-evaluation   Reason for Co-Treatment: To address functional/ADL transfers;For patient/therapist safety;Complexity of the patient's impairments (multi-system involvement) PT goals addressed during session: Mobility/safety with mobility OT goals addressed during session: ADL's and self-care      AM-PAC OT 6 Clicks Daily Activity     Outcome Measure Help from another person eating meals?: Total Help from another person taking care of personal grooming?: Total Help from another person toileting, which includes using toliet, bedpan, or urinal?: Total Help from another person bathing (including washing, rinsing, drying)?: Total Help from another person to put on and taking off regular upper body clothing?: Total Help from another person to put on and taking off regular lower body clothing?: Total 6 Click Score: 6   End of Session    Activity Tolerance: Patient limited by lethargy;Other (comment) (limited by cognition) Patient left: in bed;with call bell/phone within reach;with bed alarm set;with family/visitor present  OT Visit Diagnosis: Other abnormalities of gait and mobility (R26.89);Muscle weakness (generalized) (M62.81);Other symptoms and signs involving cognitive function                Time: 9060-8992 OT Time Calculation (min): 28 min Charges:  OT General Charges $OT Visit: 1 Visit OT Evaluation $OT Eval Moderate Complexity: 1 Mod  Derran Sear Shasta, M.S., OTR/L 04/22/23, 11:57 AM

## 2023-04-22 NOTE — Consult Note (Signed)
 [facility] CONSULT NOTE  Patient Care Team: Weyman Bright, MD as PCP - General (Internal Medicine) End, Lonni, MD as PCP - Cardiology (Cardiology) Gladis Cough, MD as Consulting Physician (General Surgery) Rennie Cindy SAUNDERS, MD as Consulting Physician (Oncology) Maurie Rayfield BIRCH, RN as Oncology Nurse Navigator  CHIEF COMPLAINTS/PURPOSE OF CONSULTATION: Pancreatic cancer  HISTORY OF PRESENTING ILLNESS:  Tony Benson 63 y.o.  male pleasant patient with a male pleasant patient with newly diagnosed metastatic pancreatic cancer with metastasis to the liver pericardial effusion diabetes; history of PE/DVT on Eliquis  is currently admitted to hospital for management hypocalcemia.  Patient also diagnosed with aspiration pneumonia on admission-on antibiotics.  Patient is currently in ICU.  Status post NG tube placement.   Patient doing overall poorly.  Oncology has been consulted along with palliative care for further discussion of goals of care.   Patient is drowsy barely arousable-patient received Dilaudid  in the morning.  He is accompanied by his daughter by the bedside.   Review of Systems  Unable to perform ROS: Mental acuity    MEDICAL HISTORY:  Past Medical History:  Diagnosis Date   Asthma    Cancer (HCC)    prostate   Diabetes mellitus without complication (HCC)    type II   Hyperlipidemia    Hypertension    Sleep apnea    uses Cpap machine     SURGICAL HISTORY: Past Surgical History:  Procedure Laterality Date   CHOLECYSTECTOMY     COLONOSCOPY WITH PROPOFOL  N/A 11/18/2018   Procedure: COLONOSCOPY WITH PROPOFOL ;  Surgeon: Janalyn Keene NOVAK, MD;  Location: [facility] ENDOSCOPY;  Service: Gastroenterology;  Laterality: N/A;   COLONOSCOPY WITH PROPOFOL  N/A 02/05/2023   Procedure: COLONOSCOPY WITH PROPOFOL ;  Surgeon: Unk Corinn Skiff, MD;  Location: [facility] ENDOSCOPY;  Service: Gastroenterology;  Laterality: N/A;   IR IMAGING GUIDED PORT  INSERTION  03/24/2023   IR US  LIVER BIOPSY  03/03/2023   LAPAROSCOPIC RETROPUBIC PROSTATECTOMY     2023   LUNG SURGERY     ligation of thoracic duct   PROSTATE BIOPSY N/A 09/21/2020   Procedure: PROSTATE BIOPSY GRAYCE;  Surgeon: Kassie Ozell SAUNDERS, MD;  Location: [facility] ORS;  Service: Urology;  Laterality: N/A;    SOCIAL HISTORY: Social History   Socioeconomic History   Marital status: Married    Spouse name: Not on file   Number of children: Not on file   Years of education: Not on file   Highest education level: Not on file  Occupational History   Occupation: Automotive Engineer   Tobacco Use   Smoking status: Never   Smokeless tobacco: Never  Vaping Use   Vaping status: Never Used  Substance and Sexual Activity   Alcohol use: No   Drug use: No   Sexual activity: Not on file  Other Topics Concern   Not on file  Social History Narrative   Not on file   Social Drivers of Health   Financial Resource Strain: Not on file  Food Insecurity: No Food Insecurity (04/18/2023)   Hunger Vital Sign    Worried About Running Out of Food in the Last Year: Never true    Ran Out of Food in the Last Year: Never true  Transportation Needs: No Transportation Needs (04/18/2023)   PRAPARE - Administrator, Civil Service (Medical): No    Lack of Transportation (Non-Medical): No  Physical Activity: Not on file  Stress: Not on file  Social Connections: Not on file  Intimate Partner Violence: Not At Risk (04/18/2023)   Humiliation, Afraid, Rape, and Kick questionnaire    Fear of Current or Ex-Partner: No    Emotionally Abused: No    Physically Abused: No    Sexually Abused: No    FAMILY HISTORY: Family History  Problem Relation Age of Onset   Allergies Sister     ALLERGIES:  is allergic to other, morphine and codeine, tree extract, and wound dressing adhesive.  MEDICATIONS:  Current Facility-Administered Medications  Medication Dose Route Frequency Provider Last Rate Last Admin    acetaminophen  (TYLENOL ) suppository 650 mg  650 mg Rectal Q4H PRN Jens Durand, MD   650 mg at 04/18/23 1729   acetaminophen  (TYLENOL ) tablet 500 mg  500 mg Oral Q6H PRN Jens Durand, MD       acetylcysteine  (MUCOMYST ) 20 % nebulizer / oral solution 4 mL  4 mL Nebulization BID Aleskerov, Fuad, MD   4 mL at 04/22/23 0729   ALPRAZolam  (XANAX ) tablet 0.5 mg  0.5 mg Oral BID PRN Jens Durand, MD   0.5 mg at 04/22/23 0134   ceFEPIme  (MAXIPIME ) 2 g in sodium chloride  0.9 % 100 mL IVPB  2 g Intravenous Q8H Jens Durand, MD   Paused at 04/22/23 9161   Chlorhexidine  Gluconate Cloth 2 % PADS 6 each  6 each Topical Daily Jens Durand, MD   6 each at 04/22/23 0854   feeding supplement (OSMOLITE 1.5 CAL) liquid 1,000 mL  1,000 mL Per Tube Continuous Ayiku, Bernard, MD 65 mL/hr at 04/22/23 1200 Infusion Verify at 04/22/23 1200   feeding supplement (PROSource TF20) liquid 60 mL  60 mL Per Tube Daily Jens Durand, MD   60 mL at 04/22/23 0946   fentaNYL  (DURAGESIC ) 12 MCG/HR 1 patch  1 patch Transdermal Q72H Ayiku, Bernard, MD   1 patch at 04/21/23 1752   free water  200 mL  200 mL Per Tube Q4H Jens Durand, MD   200 mL at 04/22/23 1146   glycopyrrolate  (ROBINUL ) injection 0.2 mg  0.2 mg Intravenous Q4H PRN Nazari, Walid A, RPH   0.2 mg at 04/22/23 0044   heparin  injection 5,000 Units  5,000 Units Subcutaneous Q8H Jens Durand, MD   5,000 Units at 04/22/23 1146   HYDROmorphone  (DILAUDID ) injection 0.5 mg  0.5 mg Intravenous Q4H PRN Jens Durand, MD   0.5 mg at 04/22/23 0803   insulin  aspart (novoLOG ) injection 0-15 Units  0-15 Units Subcutaneous TID WC Jens Durand, MD   8 Units at 04/22/23 1147   insulin  aspart (novoLOG ) injection 0-5 Units  0-5 Units Subcutaneous QHS Jens Durand, MD       ipratropium-albuterol  (DUONEB) 0.5-2.5 (3) MG/3ML nebulizer solution 3 mL  3 mL Nebulization QID Parris Manna, MD   3 mL at 04/22/23 1106   metoprolol  tartrate (LOPRESSOR ) injection 5 mg  5 mg  Intravenous Q4H PRN Jens Durand, MD   5 mg at 04/20/23 1225   metroNIDAZOLE  (FLAGYL ) IVPB 500 mg  500 mg Intravenous Q12H Jens Durand, MD   Stopped at 04/22/23 1047   mometasone -formoterol  (DULERA ) 100-5 MCG/ACT inhaler 2 puff  2 puff Inhalation BID Jens Durand, MD   2 puff at 04/22/23 0753   multivitamin with minerals tablet 1 tablet  1 tablet Per Tube Daily Jens Durand, MD   1 tablet at 04/22/23 0945   nutrition supplement (JUVEN) (JUVEN) powder packet 1 packet  1 packet Per Tube BID BM Jens Durand, MD   1 packet at 04/22/23 0945  OLANZapine  zydis (ZYPREXA ) disintegrating tablet 5 mg  5 mg Oral QHS Jesus America, NP   5 mg at 04/21/23 2126   ondansetron  (ZOFRAN ) injection 4 mg  4 mg Intravenous Q6H PRN Jens Durand, MD   4 mg at 04/20/23 1008   Oral care mouth rinse  15 mL Mouth Rinse 4 times per day Jens Durand, MD   15 mL at 04/22/23 1147   Oral care mouth rinse  15 mL Mouth Rinse PRN Jens Durand, MD       oxyCODONE  (Oxy IR/ROXICODONE ) immediate release tablet 5 mg  5 mg Per NG tube Q8H PRN Jens Durand, MD   5 mg at 04/22/23 0133   phosphorus (K PHOS  NEUTRAL) tablet 500 mg  500 mg Per Tube TID Nada Adriana BIRCH, RPH   500 mg at 04/22/23 9195   polyethylene glycol (MIRALAX  / GLYCOLAX ) packet 17 g  17 g Per NG tube Daily PRN Jens Durand, MD   17 g at 04/21/23 1307   scopolamine  (TRANSDERM-SCOP) 1 MG/3DAYS 1.5 mg  1 patch Transdermal Q72H Jens Durand, MD   1.5 mg at 04/20/23 0945   senna-docusate (Senokot-S) tablet 1 tablet  1 tablet Per NG tube QHS PRN Jens Durand, MD       thiamine  (VITAMIN B1) tablet 100 mg  100 mg Per Tube Daily Jens Durand, MD   100 mg at 04/22/23 0804    PHYSICAL EXAMINATION:   Vitals:   04/22/23 1108 04/22/23 1200  BP:  114/62  Pulse:  (!) 110  Resp:  20  Temp:    SpO2: 100% 100%   Filed Weights   04/20/23 0350 04/21/23 0500 04/22/23 0427  Weight: 142 lb 3.2 oz (64.5 kg) 145 lb 1 oz (65.8 kg) 142 lb 10.2 oz (64.7 kg)    Patient drowsy.  Barely arousable to verbal stimuli.  Cachectic.  Has NG tube in place abdominal distention noted.  Bilateral lower extremity swelling noted.  Physical Exam Vitals and nursing note reviewed.  HENT:     Head: Normocephalic and atraumatic.  Cardiovascular:     Rate and Rhythm: Normal rate and regular rhythm.  Pulmonary:     Comments: Decreased breath sounds bilaterally.  Abdominal:     Palpations: Abdomen is soft.  Musculoskeletal:        General: Normal range of motion.     Cervical back: Normal range of motion.  Skin:    General: Skin is warm.  Neurological:     Mental Status: He is alert.     LABORATORY DATA:  I have reviewed the data as listed Lab Results  Component Value Date   WBC 29.3 (H) 04/22/2023   HGB 7.7 (L) 04/22/2023   HCT 27.3 (L) 04/22/2023   MCV 88.3 04/22/2023   PLT 182 04/22/2023   Recent Labs    03/07/23 0248 03/12/23 0846 04/18/23 0926 04/18/23 1135 04/19/23 0500 04/20/23 0328 04/21/23 0434 04/22/23 0400  NA  --    < > 144 144 149* 149* 149* 150*  K  --    < > 3.8 3.6 3.5 3.4* 4.1 4.3  CL  --    < > 115* 115* 120* 119* 121* 121*  CO2  --    < > 20* 17* 23 21* 20* 23  GLUCOSE  --    < > 251* 226* 187* 162* 245* 304*  BUN  --    < > 33* 32* 26* 22 32* 40*  CREATININE  --    < >  0.89 0.80 0.62 0.70 0.76 0.73  CALCIUM   --    < > 10.3 10.2 9.8 10.1 9.9 10.0  GFRNONAA  --    < > >60 >60 >60 >60 >60 >60  PROT 6.5   < > 5.9* 6.1* 5.3*  --   --   --   ALBUMIN  2.0*   < > 1.8* 1.8* 2.0*  --   --   --   AST 31   < > 25 36 16  --   --   --   ALT 37   < > 21 24 16   --   --   --   ALKPHOS 463*   < > 664* 765* 537*  --   --   --   BILITOT 0.5   < > 1.1 1.2 0.8  --   --   --   BILIDIR 0.1  --   --   --   --   --   --   --   IBILI 0.4  --   --   --   --   --   --   --    < > = values in this interval not displayed.    RADIOGRAPHIC STUDIES: I have personally reviewed the radiological images as listed and agreed with the findings in  the report. US  CHEST (PLEURAL EFFUSION) Result Date: 04/21/2023 CLINICAL DATA:  Interventional Radiology asked to assess the patient for possible right-sided thoracentesis. EXAM: CHEST ULTRASOUND COMPARISON:  DG chest April 20, 2023 FINDINGS: Limited ultrasound evaluation of the right lung revealed consolidated lung tissue with scattered areas of fluid. No drainable collections to target. No procedure performed. IMPRESSION: Limited ultrasound evaluation of the right lung revealed scattered areas of fluid with no drainable collections to target. No procedure performed in the image findings were discussed with the patient's family. Ultrasound images reviewed by Warren Dais NP. Electronically Signed   By: Marcey Moan M.D.   On: 04/21/2023 17:20   ECHOCARDIOGRAM LIMITED Result Date: 04/21/2023    ECHOCARDIOGRAM LIMITED REPORT   Patient Name:   Tony Benson Date of Exam: 04/21/2023 Medical Rec #:  [id number]     Height:       70.0 in Accession #:    [id number]    Weight:       145.1 lb Date of Birth:  03-04-1961    BSA:          1.821 m Patient Age:    62 years      BP:           113/63 mmHg Patient Gender: M             HR:           127 bpm. Exam Location:  [facility] Procedure: Limited Echo, Cardiac Doppler and Color Doppler Indications:     Pericardial Effusion I31.3  History:         Patient has prior history of Echocardiogram examinations, most                  recent 04/19/2023. Risk Factors:Hypertension and Diabetes.  Sonographer:     Christopher Furnace Referring Phys:  [id number] DARRYLE NED O'NEAL Diagnosing Phys: Deatrice Cage MD IMPRESSIONS  1. Left ventricular ejection fraction, by estimation, is 55 to 60%. The left ventricle has normal function. The left ventricle has no regional wall motion abnormalities. Left ventricular diastolic parameters are consistent with Grade I diastolic dysfunction (impaired relaxation).  2.  Right ventricular systolic function is normal. The right ventricular size is normal. Tricuspid  regurgitation signal is inadequate for assessing PA pressure.  3. Large pericardial effusion. The pericardial effusion is circumferential. No Rv diastolic collapse.  4. The mitral valve is normal in structure. No evidence of mitral valve regurgitation. No evidence of mitral stenosis.  5. The aortic valve is normal in structure. Aortic valve regurgitation is not visualized. No aortic stenosis is present.  6. The inferior vena cava is dilated in size with >50% respiratory variability, suggesting right atrial pressure of 8 mmHg. Comparison(s): A prior study was performed on 04/19/2023. No significant change from prior study. FINDINGS  Left Ventricle: Left ventricular ejection fraction, by estimation, is 55 to 60%. The left ventricle has normal function. The left ventricle has no regional wall motion abnormalities. The left ventricular internal cavity size was normal in size. There is  no left ventricular hypertrophy. Left ventricular diastolic parameters are consistent with Grade I diastolic dysfunction (impaired relaxation). Right Ventricle: The right ventricular size is normal. No increase in right ventricular wall thickness. Right ventricular systolic function is normal. Tricuspid regurgitation signal is inadequate for assessing PA pressure. The tricuspid regurgitant velocity is 2.23 m/s, and with an assumed right atrial pressure of 8 mmHg, the estimated right ventricular systolic pressure is 27.9 mmHg. Left Atrium: Left atrial size was normal in size. Right Atrium: Right atrial size was normal in size. Pericardium: A large pericardial effusion is present. The pericardial effusion is circumferential. Mitral Valve: The mitral valve is normal in structure. No evidence of mitral valve stenosis. Tricuspid Valve: The tricuspid valve is normal in structure. Tricuspid valve regurgitation is not demonstrated. No evidence of tricuspid stenosis. Aortic Valve: The aortic valve is normal in structure. Aortic valve regurgitation  is not visualized. No aortic stenosis is present. Pulmonic Valve: The pulmonic valve was normal in structure. Pulmonic valve regurgitation is not visualized. No evidence of pulmonic stenosis. Aorta: The aortic root is normal in size and structure. Venous: The inferior vena cava is dilated in size with greater than 50% respiratory variability, suggesting right atrial pressure of 8 mmHg. IAS/Shunts: No atrial level shunt detected by color flow Doppler. Additional Comments: Spectral Doppler performed. Color Doppler performed.  LEFT VENTRICLE PLAX 2D LVIDd:         4.40 cm Diastology LVIDs:         2.80 cm LV e' medial:    9.36 cm/s LV PW:         1.20 cm LV E/e' medial:  7.7 LV IVS:        1.10 cm LV e' lateral:   20.50 cm/s                        LV E/e' lateral: 3.5  RIGHT VENTRICLE RV Basal diam:  2.40 cm RV Mid diam:    2.20 cm RV S prime:     20.00 cm/s TAPSE (M-mode): 1.7 cm LEFT ATRIUM         Index       RIGHT ATRIUM          Index LA diam:    3.50 cm 1.92 cm/m  RA Area:     7.69 cm                                 RA Volume:   10.80 ml 5.93 ml/m  MITRAL VALVE  TRICUSPID VALVE MV Area (PHT): 9.03 cm    TR Peak grad:   19.9 mmHg MV Decel Time: 84 msec     TR Vmax:        223.00 cm/s MV E velocity: 71.80 cm/s MV A velocity: 87.50 cm/s MV E/A ratio:  0.82 Deatrice Cage MD Electronically signed by Deatrice Cage MD Signature Date/Time: 04/21/2023/3:22:19 PM    Final    DG Chest Port 1 View Result Date: 04/20/2023 CLINICAL DATA:  63 year old male with history of acute respiratory failure. EXAM: PORTABLE CHEST 1 VIEW COMPARISON:  Chest x-ray 04/29/2023. FINDINGS: A feeding tube is seen extending into the abdomen, however, the tip of the feeding tube extends below the lower margin of the image. Right internal jugular single-lumen Port-A-Cath with tip terminating at the superior cavoatrial junction. Substantially worsened aeration throughout the right lung which is now nearly completely opacified,  indicative of progressive atelectasis and/or consolidation, also with superimposed large right pleural effusion. Patchy areas of interstitial prominence an ill-defined opacities scattered throughout the left lung, concerning for bronchopneumonia. No left pleural effusion. No pneumothorax. No evidence of pulmonary edema. Heart size appears normal. Mediastinal contours are distorted by patient positioning and rotation to the right. IMPRESSION: 1. Enlarging large right pleural effusion with increasing atelectasis and/or consolidation throughout nearly the entire right lung, substantially worsened compared with yesterday's examination. 2. Probable left-sided bronchopneumonia. Electronically Signed   By: Toribio Aye M.D.   On: 04/20/2023 08:17   DG Chest Port 1 View Result Date: 04/19/2023 CLINICAL DATA:  Feeding tube placement EXAM: PORTABLE CHEST 1 VIEW COMPARISON:  X-ray 04/17/2023 and older.  CTA 04/18/2023. FINDINGS: Feeding tube in place with tip extending into the left upper quadrant, likely along the midbody of the stomach. Separate right IJ catheter with the tip overlying the SVC right atrial junction. Underinflation with persistent right-sided pleural effusion and adjacent opacity. Patchy parenchymal opacities as well as left mid to lower lung, more pronounced in the prior x-ray but today's x-rays also less well inflated. No pneumothorax. No left effusion. Stable cardiopericardial silhouette. IMPRESSION: Feeding tube with the tip overlying the midbody of the stomach. Increasing parenchymal opacities in the left lung base. Persistent patchy opacities in the right lung with a right-sided pleural effusion. Chest port Recommend follow up Electronically Signed   By: Ranell Bring M.D.   On: 04/19/2023 14:17   ECHOCARDIOGRAM COMPLETE Result Date: 04/19/2023    ECHOCARDIOGRAM REPORT   Patient Name:   Tony Benson Date of Exam: 04/19/2023 Medical Rec #:  [id number]     Height:       70.0 in Accession #:     [id number]    Weight:       146.6 lb Date of Birth:  11/12/1960    BSA:          1.829 m Patient Age:    62 years      BP:           104/56 mmHg Patient Gender: M             HR:           96 bpm. Exam Location:  [facility] Procedure: 2D Echo Indications:     Pericardial effusion I31.3  History:         Patient has prior history of Echocardiogram examinations, most                  recent 04/01/2023.  Sonographer:     Thedora  Vicci NESTLE, FASE Referring Phys:  [id number] CORT ONEIDA MANA Diagnosing Phys: Darryle Decent MD IMPRESSIONS  1. Large pericardial effusion up to 2.1 cm in the apical region of the LV. It appears similar to the prior study. There is no overt RV diastolic collapse. There is late systolic collapse of the RA, which is a nonspecific finding. There is 25% variation in MV inflow noted. The IVC is dilated. Overall, the effusion appears similar to the prior study with no overt features of tamponde, but several features suggest possible early tamponade. Clinical correlation is recommended. Large pericardial effusion. The pericardial effusion is circumferential.  2. Left ventricular ejection fraction, by estimation, is 60 to 65%. The left ventricle has normal function. The left ventricle has no regional wall motion abnormalities. There is mild concentric left ventricular hypertrophy. Left ventricular diastolic parameters were normal.  3. Right ventricular systolic function is normal. The right ventricular size is normal. There is moderately elevated pulmonary artery systolic pressure. The estimated right ventricular systolic pressure is 45.5 mmHg.  4. The mitral valve is grossly normal. Trivial mitral valve regurgitation. No evidence of mitral stenosis.  5. The aortic valve is tricuspid. Aortic valve regurgitation is not visualized. No aortic stenosis is present.  6. The inferior vena cava is dilated in size with >50% respiratory variability, suggesting right atrial pressure of 8 mmHg. FINDINGS  Left Ventricle:  Left ventricular ejection fraction, by estimation, is 60 to 65%. The left ventricle has normal function. The left ventricle has no regional wall motion abnormalities. The left ventricular internal cavity size was normal in size. There is  mild concentric left ventricular hypertrophy. Left ventricular diastolic parameters were normal. Right Ventricle: The right ventricular size is normal. No increase in right ventricular wall thickness. Right ventricular systolic function is normal. There is moderately elevated pulmonary artery systolic pressure. The tricuspid regurgitant velocity is 2.76 m/s, and with an assumed right atrial pressure of 15 mmHg, the estimated right ventricular systolic pressure is 45.5 mmHg. Left Atrium: Left atrial size was normal in size. Right Atrium: Right atrial size was normal in size. Pericardium: Large pericardial effusion up to 2.1 cm in the apical region of the LV. It appears similar to the prior study. There is no overt RV diastolic collapse. There is late systolic collapse of the RA, which is a nonspecific finding. There is 25% variation in MV inflow noted. The IVC is dilated. Overall, the effusion appears similar to the prior study with no overt features of tamponde, but several features suggest possible early tamponade. Clinical correlation is recommended. A large pericardial  effusion is present. The pericardial effusion is circumferential. Mitral Valve: The mitral valve is grossly normal. Trivial mitral valve regurgitation. No evidence of mitral valve stenosis. Tricuspid Valve: The tricuspid valve is grossly normal. Tricuspid valve regurgitation is trivial. No evidence of tricuspid stenosis. Aortic Valve: The aortic valve is tricuspid. Aortic valve regurgitation is not visualized. No aortic stenosis is present. Aortic valve peak gradient measures 8.2 mmHg. Pulmonic Valve: The pulmonic valve was grossly normal. Pulmonic valve regurgitation is not visualized. No evidence of pulmonic  stenosis. Aorta: The aortic root and ascending aorta are structurally normal, with no evidence of dilitation. Venous: The inferior vena cava is dilated in size with greater than 50% respiratory variability, suggesting right atrial pressure of 8 mmHg. IAS/Shunts: The atrial septum is grossly normal.  LEFT VENTRICLE PLAX 2D LVIDd:         3.40 cm   Diastology LVIDs:  2.20 cm   LV e' medial:    10.70 cm/s LV PW:         1.30 cm   LV E/e' medial:  9.3 LV IVS:        1.20 cm   LV e' lateral:   17.70 cm/s LVOT diam:     2.10 cm   LV E/e' lateral: 5.6 LV SV:         83 LV SV Index:   45 LVOT Area:     3.46 cm  RIGHT VENTRICLE RV Basal diam:  3.00 cm RV S prime:     22.40 cm/s TAPSE (M-mode): 1.7 cm LEFT ATRIUM             Index        RIGHT ATRIUM          Index LA diam:        2.80 cm 1.53 cm/m   RA Area:     8.44 cm LA Vol (A2C):   41.2 ml 22.52 ml/m  RA Volume:   16.60 ml 9.08 ml/m LA Vol (A4C):   26.9 ml 14.71 ml/m LA Biplane Vol: 34.9 ml 19.08 ml/m  AORTIC VALVE                 PULMONIC VALVE AV Area (Vmax): 2.83 cm     PV Vmax:        1.07 m/s AV Vmax:        143.00 cm/s  PV Peak grad:   4.6 mmHg AV Peak Grad:   8.2 mmHg     RVOT Peak grad: 3 mmHg LVOT Vmax:      117.00 cm/s LVOT Vmean:     78.800 cm/s LVOT VTI:       0.239 m  AORTA Ao Root diam: 3.70 cm Ao Asc diam:  3.40 cm MITRAL VALVE               TRICUSPID VALVE MV Area (PHT): 4.83 cm    TR Peak grad:   30.5 mmHg MV Decel Time: 157 msec    TR Vmax:        276.00 cm/s MV E velocity: 99.00 cm/s MV A velocity: 87.00 cm/s  SHUNTS MV E/A ratio:  1.14        Systemic VTI:  0.24 m                            Systemic Diam: 2.10 cm Darryle Decent MD Electronically signed by Darryle Decent MD Signature Date/Time: 04/19/2023/12:50:02 PM    Final    DG Chest 2 View Result Date: 04/18/2023 CLINICAL DATA:  Cough.  Wheezing.  Shortness of breath. EXAM: CHEST - 2 VIEW COMPARISON:  03/31/2023. FINDINGS: Redemonstration of right basilar opacity with associated  small right pleural effusion without significant interval change. Bilateral lung fields are otherwise clear. Left lateral costophrenic angle is clear. Stable cardio-mediastinal silhouette. No acute osseous abnormalities. The soft tissues are within normal limits. Right-sided CT Port-A-Cath is seen with its tip overlying the lower portion of superior vena cava. IMPRESSION: *Essentially stable exam. Right basilar opacity and small right pleural effusion, without significant interval change. Electronically Signed   By: Ree Molt M.D.   On: 04/18/2023 14:21   CT Angio Chest PE W and/or Wo Contrast Result Date: 04/18/2023 CLINICAL DATA:  shortness of breath, tachycardia, hx of blood clots with missed blood thinner dose, evaluating PE vs aspiration PNA EXAM:  CT ANGIOGRAPHY CHEST WITH CONTRAST TECHNIQUE: Multidetector CT imaging of the chest was performed using the standard protocol during bolus administration of intravenous contrast. Multiplanar CT image reconstructions and MIPs were obtained to evaluate the vascular anatomy. RADIATION DOSE REDUCTION: This exam was performed according to the departmental dose-optimization program which includes automated exposure control, adjustment of the mA and/or kV according to patient size and/or use of iterative reconstruction technique. CONTRAST:  75mL OMNIPAQUE  IOHEXOL  350 MG/ML SOLN COMPARISON:  CT angiography chest from 03/31/2023. FINDINGS: Cardiovascular: No evidence of embolism to the proximal subsegmental pulmonary artery level. Normal cardiac size. Redemonstration of moderate-to-large pericardial effusion without significant interval change. No aortic aneurysm. There are coronary artery calcifications, in keeping with coronary artery disease. There are also mild peripheral atherosclerotic vascular calcifications of thoracic aorta and its major branches. Mediastinum/Nodes: Visualized thyroid gland appears grossly unremarkable. No solid / cystic mediastinal masses. The  esophagus is nondistended precluding optimal assessment. No axillary, mediastinal or hilar lymphadenopathy by size criteria. Lungs/Pleura: There are retained mucus secretions along the periphery of the upper trachea. There is interval increase in the peribronchovascular opacities mainly in the right lung with middle lobe/lower lobe predominance. There also slightly irregular/nodular interlobular septal thickening in the right lung. There is also irregularity along the right major fissure and minor fissure. There are multiple ill-defined predominantly centrilobular nodules in the left lung lower lobe with interval increase when compared to the prior exam. No new dense consolidation or lung collapse. Redemonstration of small loculated right pleural effusion. No left pleural effusion. No pneumothorax. Redemonstration of pleural calcifications in the right posteroinferior hemithorax, nonspecific but commonly seen as a sequela of prior asbestos related disease. Upper Abdomen: Redemonstration of extensive, ill-defined, hypoattenuating areas in the visualized liver, compatible with metastasis. There is dilated main pancreatic duct in the body/tail region with associated atrophy of the pancreas. Remaining visualized upper abdominal viscera are within normal limits. Musculoskeletal: The visualized soft tissues of the chest wall are grossly unremarkable. No suspicious osseous lesions. There are mild multilevel degenerative changes in the visualized spine. Review of the MIP images confirms the above findings. IMPRESSION: 1. No embolism the proximal subsegmental pulmonary artery level. 2. Interval increase in the previously seen peri-bronchovascular opacities mainly in the right lung. There is also slightly irregular/nodular interlobular septal thickening in the right lung. Small loculated right pleural effusion. 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 Correlate clinically. 3. Extensive  ill-defined hypoattenuating areas in the liver, compatible with metastases. 4. Moderate-to-large pericardial effusion without significant interval change. 5. Multiple other nonacute observations, as described above. Aortic Atherosclerosis (ICD10-I70.0). Electronically Signed   By: Ree Molt M.D.   On: 04/18/2023 14:19   DG Swallowing Func-Speech Pathology Result Date: 04/04/2023 Table formatting from the original result was not included. Modified Barium Swallow Study Patient Details Name: Tony Benson MRN: [id number] Date of Birth: 1960-12-16 Today's Date: 04/04/2023 HPI/PMH: HPI: Pt is a 63 y.o. male with medical history significant for Metastatic pancreatic Cancer on Palliative Chemotherapy, asthma, prostate Cancer, type 2 diabetes mellitus, hypertension, dyslipidemia, and PE on Eliquis , who presented to the emergency room with acute onset of dyspnea with associated cough productive of yellow sputum without wheezing.  Today patient was found to have hemoglobin 6.5.  He agreed for blood transfusion however during transfusion he developed worsening shortness of breath with tachycardia.  Transfusion was aborted and blood return to blood bank for transfusion reaction workup.  Patient has also been seen by cardiologist and awaiting repeat echo  before decision is made on Large pericardial effusion, lung metastasis per MD note.  Per Oncology note: Patient was hospitalized November 2024 with shortness of breath/acute PE, bilateral DVT.  CT of the abdomen and pelvis found to have a pancreatic head mass and liver masses.  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  Patient is status post 2 cycles of gemcitabine  and Abraxane .  Now admitted to the hospital with pneumonia.  Palliative Care was consulted for GOC.   Chest CT: Large pericardial effusion increased compared to 02/27/2023.  Clinical correlation recommended to exclude tamponade physiology.  3. Increased bilateral  centrilobular micro nodules and tree-in-bud  opacities with more confluence opacities in the right lung, likely  due to bronchopneumonia from aspiration. Lymphangitic spread of  tumor could appear similarly.  4. Irregular pleural thickening or loculated pleural effusion in the  lower right chest has increased compared to 02/27/2023. There is  associated pleural nodularity in the posterior lower lobe. This may be due to bronchopneumonia though metastases are not excluded. Clinical Impression: Clinical Impression: Pt presents with profound oropharyngeal dysphagia that is likely related to overall deconditioned state. As such he is at a very high risk of aspiration, malnutrition and dehydration when consuming POs. Specifically, when consuming thin liquids via spoon, nectar thick liquids via spoon and small cup sips as well as small boluses of puree, he presents with sensed and silent aspiration of all consistencies. When aspiration is sensed, he produced a delayed weak non-productive cough that was not effective in clearing aspirates or laryngeal penetrates. As such, would recommend NPO and discussion regarding GOC. Recommend extensive oral care to reduce the bacterial load of pt's salvia as his risk of aspirating his own salvia is high.     Analysis of swallow function:  Oral phase is c/b decrease propulsion of boluses over base of tongue  Pharyngeal phase is c/b decreased base of tongue strength, weak hyoid movement, decreased epiglottic deflection, delayed swallow initiation to leave of pyriform sinuses and weak pharyngeal parastasis resulting in reduced UES opening and passage of boluses as well s gross widespread pharyngeal residue throughout pharynx. Boluses were observed to be resting within the laryngeal vestibule and on pt's vocal cords with no ability to clear these penetrates even with a cued cough. Aspiration was gross when consuming thin liquids via spoon d/t delayed pharyngeal swallow. While less of the  nectar and puree were aspirated, pharyngeal residue was greater resulting in continued build-up of penetrates. Factors that may increase risk of adverse event in presence of aspiration Noe & Lianne 2021): Factors that may increase risk of adverse event in presence of aspiration Noe & Lianne 2021): Poor general health and/or compromised immunity; Reduced cognitive function; Limited mobility; Frail or deconditioned; Dependence for feeding and/or oral hygiene; Inadequate oral hygiene; Reduced saliva; Weak cough; Frequent aspiration of large volumes Recommendations/Plan: Swallowing Evaluation Recommendations Swallowing Evaluation Recommendations Recommendations: NPO Medication Administration: Via alternative means Oral care recommendations: Oral care QID (4x/day) Recommended consults: Consider Palliative care Caregiver Recommendations: -- (TBD) Treatment Plan Treatment Plan Treatment recommendations: No treatment recommended at this time Follow-up recommendations: No SLP follow up Recommendations Comment: Palliative Care f/u for GOC Recommendations Recommendations for follow up therapy are one component of a multi-disciplinary discharge planning process, led by the attending physician.  Recommendations may be updated based on patient status, additional functional criteria and insurance authorization. Assessment: Orofacial Exam: Orofacial Exam Oral Cavity: Oral Hygiene: Xerostomia Oral Cavity - Dentition: Adequate natural dentition Orofacial Anatomy: [facility]  Oral Motor/Sensory Function: WFL Anatomy: Anatomy: WFL Boluses Administered: Boluses Administered Boluses Administered: Thin liquids (Level 0); Mildly thick liquids (Level 2, nectar thick); Puree  Oral Impairment Domain: Oral Impairment Domain Lip Closure: No labial escape Tongue control during bolus hold: Not tested Bolus preparation/mastication: Slow prolonged chewing/mashing with complete recollection Bolus transport/lingual motion: Delayed initiation of  tongue motion (oral holding); Slow tongue motion Oral residue: Residue collection on oral structures Location of oral residue : Floor of mouth; Tongue Initiation of pharyngeal swallow : Pyriform sinuses  Pharyngeal Impairment Domain: Pharyngeal Impairment Domain Soft palate elevation: No bolus between soft palate (SP)/pharyngeal wall (PW) Laryngeal elevation: Partial superior movement of thyroid cartilage/partial approximation of arytenoids to epiglottic petiole Anterior hyoid excursion: Partial anterior movement; No anterior movement Epiglottic movement: Partial inversion; No inversion Laryngeal vestibule closure: None, wide column air/contrast in laryngeal vestibule; Incomplete, narrow column air/contrast in laryngeal vestibule Pharyngeal stripping wave : Present - diminished Pharyngoesophageal segment opening: Minimal distention/minimal duration, marked obstruction of flow; Partial distention/partial duration, partial obstruction of flow Tongue base retraction: Narrow column of contrast or air between tongue base and PPW; Wide column of contrast or air between tongue base and PPW Pharyngeal residue: Majority of contrast within or on pharyngeal structures Location of pharyngeal residue: Tongue base; Valleculae; Pharyngeal wall; Pyriform sinuses  Esophageal Impairment Domain: Esophageal Impairment Domain Esophageal clearance upright position: Complete clearance, esophageal coating Pill: No data recorded Penetration/Aspiration Scale Score: Penetration/Aspiration Scale Score 7.  Material enters airway, passes BELOW cords and not ejected out despite cough attempt by patient: Thin liquids (Level 0); Mildly thick liquids (Level 2, nectar thick); Puree 8.  Material enters airway, passes BELOW cords without attempt by patient to eject out (silent aspiration) : Thin liquids (Level 0); Mildly thick liquids (Level 2, nectar thick); Puree Compensatory Strategies: No data recorded  General Information: Caregiver present: No   Diet Prior to this Study: Dysphagia 3 (mechanical soft); Thin liquids (Level 0)   Temperature : Normal   Respiratory Status: WFL   Supplemental O2: None (Room air)   History of Recent Intubation: No  Behavior/Cognition: Alert; Cooperative; Distractible; Requires cueing Self-Feeding Abilities: Needs assist with self-feeding Baseline vocal quality/speech: Normal Volitional Cough: Able to elicit Volitional Swallow: Able to elicit Exam Limitations: No limitations Goal Planning: Prognosis for improved oropharyngeal function: -- (Poor) Barriers to Reach Goals: Cognitive deficits; Time post onset; Severity of deficits; Overall medical prognosis Barriers/Prognosis Comment: Frail; Drowsy; Pain/pain meds; Cancer w/ Metastasis Patient/Family Stated Goal: none stated Consulted and agree with results and recommendations: Physician; Nurse Pain: Pain Assessment Pain Assessment: Faces Faces Pain Scale: 2 Pain Location: R shoulder; BLEs Pain Descriptors / Indicators: Aching Pain Intervention(s): Monitored during session; Premedicated before session End of Session: Start Time:SLP Start Time (ACUTE ONLY): 1430 Stop Time: SLP Stop Time (ACUTE ONLY): 1450 Time Calculation:SLP Time Calculation (min) (ACUTE ONLY): 20 min Charges: SLP Evaluations $ SLP Speech Visit: 1 Visit SLP Evaluations $BSS Swallow: 1 Procedure $MBS Swallow: 1 Procedure $Self Care/Home Management: 8-22 SLP visit diagnosis: SLP Visit Diagnosis: Dysphagia, oropharyngeal phase (R13.12) Past Medical History: Past Medical History: Diagnosis Date  Asthma   Cancer (HCC)   prostate  Diabetes mellitus without complication (HCC)   type II  Hyperlipidemia   Hypertension   Sleep apnea   uses Cpap machine  Past Surgical History: Past Surgical History: Procedure Laterality Date  CHOLECYSTECTOMY    COLONOSCOPY WITH PROPOFOL  N/A 11/18/2018  Procedure: COLONOSCOPY WITH PROPOFOL ;  Surgeon: Janalyn Keene NOVAK, MD;  Location: [facility] ENDOSCOPY;  Service: Gastroenterology;  Laterality: N/A;   COLONOSCOPY WITH PROPOFOL  N/A 02/05/2023  Procedure: COLONOSCOPY WITH PROPOFOL ;  Surgeon: Unk Corinn Skiff, MD;  Location: [facility] ENDOSCOPY;  Service: Gastroenterology;  Laterality: N/A;  IR IMAGING GUIDED PORT INSERTION  03/24/2023  IR US  LIVER BIOPSY  03/03/2023  LAPAROSCOPIC RETROPUBIC PROSTATECTOMY    2023  LUNG SURGERY    ligation of thoracic duct  PROSTATE BIOPSY N/A 09/21/2020  Procedure: PROSTATE BIOPSY GRAYCE;  Surgeon: Kassie Ozell SAUNDERS, MD;  Location: [facility] ORS;  Service: Urology;  Laterality: N/A; Happi Overton 04/04/2023, 3:25 PM  US  Venous Img Lower Bilateral (DVT) Result Date: 04/02/2023 CLINICAL DATA:  BILATERAL lower extremity pain times months. [id number] Lower extremity pain [id number] EXAM: BILATERAL LOWER EXTREMITY VENOUS DOPPLER ULTRASOUND TECHNIQUE: Gray-scale sonography with graded compression, as well as color Doppler and duplex ultrasound were performed to evaluate the lower extremity deep venous systems from the level of the common femoral vein and including the common femoral, femoral, profunda femoral, popliteal and calf veins including the posterior tibial, peroneal and gastrocnemius veins when visible. The superficial great saphenous vein was also interrogated. Spectral Doppler was utilized to evaluate flow at rest and with distal augmentation maneuvers in the common femoral, femoral and popliteal veins. COMPARISON:  Lower extremity venous duplex, most recently 03/25/2023, POSITIVE for BILATERAL lower extremity DVT of the RIGHT femoral and LEFT popliteal veins. FINDINGS: RIGHT LOWER EXTREMITY VENOUS Normal compressibility of the RIGHT common femoral and visualized portions of profunda femoral vein and great saphenous veins. Similar appearance of near-occlusive filling defect involving the RIGHT femoral, popliteal and visualized portions of the calf veins OTHER No evidence of superficial thrombophlebitis or abnormal fluid collection. Limitations: none LEFT LOWER EXTREMITY VENOUS Normal  compressibility of the LEFT common femoral, superficial femoral, profunda femoral vein and great saphenous veins. Similar appearance of near-occlusive filling defect within the LEFT popliteal and visualized portions of the calf veins. OTHER No evidence of superficial thrombophlebitis or abnormal fluid collection. Limitations: none IMPRESSION: Since lower extremity venous duplex dated 03/25/2023; 1. No new or worsening DVT within either lower extremity. 2. Similar appearance of BILATERAL lower extremity DVT, extending from the RIGHT femoral through the calf veins and LEFT popliteal through the calf veins Thom Hall, MD [facility] Electronically Signed   By: Thom Hall M.D.   On: 04/02/2023 13:38   ECHOCARDIOGRAM COMPLETE Result Date: 04/01/2023    ECHOCARDIOGRAM REPORT   Patient Name:   Tony Benson Date of Exam: 04/01/2023 Medical Rec #:  [id number]     Height:       70.0 in Accession #:    [id number]    Weight:       149.0 lb Date of Birth:  01-Jun-1960    BSA:          1.842 m Patient Age:    62 years      BP:           109/52 mmHg Patient Gender: M             HR:           100 bpm. Exam Location:  [facility] Procedure: 2D Echo, Cardiac Doppler and Color Doppler Indications:     Pericardial Effusion I31.3  History:         Patient has prior history of Echocardiogram examinations, most                  recent 03/04/2023. Risk Factors:Diabetes, Hypertension and  Sleep Apnea.  Sonographer:     Christopher Furnace Referring Phys:  [id number] JAN A MANSY Diagnosing Phys: Lonni Hanson MD IMPRESSIONS  1. Left ventricular ejection fraction, by estimation, is 60 to 65%. The left ventricle has normal function. The left ventricle has no regional wall motion abnormalities. There is moderate left ventricular hypertrophy. Left ventricular diastolic parameters are consistent with Grade II diastolic dysfunction (pseudonormalization).  2. Right ventricular  systolic function is normal. The right ventricular size is normal. There is mildly elevated pulmonary artery systolic pressure.  3. There is equivocal early diastolic collapse of the right ventricle. However, mitral and tricuspid valve inflow velocity respiratory variation is not consistent with tamponade physiology.. Large pericardial effusion. The pericardial effusion is circumferential.  4. The mitral valve is normal in structure. Trivial mitral valve regurgitation. No evidence of mitral stenosis.  5. The aortic valve is tricuspid. Aortic valve regurgitation is not visualized. No aortic stenosis is present.  6. The inferior vena cava is normal in size with <50% respiratory variability, suggesting right atrial pressure of 8 mmHg. Comparison(s): A prior study was performed on 03/04/2023. The pericardial effusion appears somewhat larger. FINDINGS  Left Ventricle: Left ventricular ejection fraction, by estimation, is 60 to 65%. The left ventricle has normal function. The left ventricle has no regional wall motion abnormalities. The left ventricular internal cavity size was normal in size. There is  moderate left ventricular hypertrophy. Left ventricular diastolic parameters are consistent with Grade II diastolic dysfunction (pseudonormalization). Right Ventricle: The right ventricular size is normal. No increase in right ventricular wall thickness. Right ventricular systolic function is normal. There is mildly elevated pulmonary artery systolic pressure. The tricuspid regurgitant velocity is 3.00  m/s, and with an assumed right atrial pressure of 8 mmHg, the estimated right ventricular systolic pressure is 44.0 mmHg. Left Atrium: Left atrial size was normal in size. Right Atrium: Right atrial size was normal in size. Pericardium: There is equivocal early diastolic collapse of the right ventricle. However, mitral and tricuspid valve inflow velocity respiratory variation is not consistent with tamponade physiology. A  large pericardial effusion is present. The pericardial effusion is circumferential. Mitral Valve: The mitral valve is normal in structure. Trivial mitral valve regurgitation. No evidence of mitral valve stenosis. MV peak gradient, 4.8 mmHg. The mean mitral valve gradient is 3.0 mmHg. Tricuspid Valve: The tricuspid valve is normal in structure. Tricuspid valve regurgitation is mild. Aortic Valve: The aortic valve is tricuspid. Aortic valve regurgitation is not visualized. No aortic stenosis is present. Aortic valve mean gradient measures 4.0 mmHg. Aortic valve peak gradient measures 6.0 mmHg. Aortic valve area, by VTI measures 2.91 cm. Pulmonic Valve: The pulmonic valve was grossly normal. Pulmonic valve regurgitation is trivial. No evidence of pulmonic stenosis. Aorta: The aortic root is normal in size and structure. Pulmonary Artery: The pulmonary artery is not well seen. Venous: The inferior vena cava is normal in size with less than 50% respiratory variability, suggesting right atrial pressure of 8 mmHg. IAS/Shunts: No atrial level shunt detected by color flow Doppler.  LEFT VENTRICLE PLAX 2D LVIDd:         4.10 cm   Diastology LVIDs:         1.80 cm   LV e' medial:    6.42 cm/s LV PW:         1.50 cm   LV E/e' medial:  13.8 LV IVS:        1.20 cm   LV e' lateral:   15.00  cm/s LVOT diam:     2.10 cm   LV E/e' lateral: 5.9 LV SV:         58 LV SV Index:   32 LVOT Area:     3.46 cm  RIGHT VENTRICLE RV Basal diam:  2.90 cm RV Mid diam:    2.50 cm RV S prime:     20.90 cm/s TAPSE (M-mode): 1.8 cm LEFT ATRIUM           Index        RIGHT ATRIUM          Index LA diam:      2.40 cm 1.30 cm/m   RA Area:     9.46 cm LA Vol (A4C): 20.3 ml 11.02 ml/m  RA Volume:   14.30 ml 7.76 ml/m  AORTIC VALVE AV Area (Vmax):    2.70 cm AV Area (Vmean):   2.44 cm AV Area (VTI):     2.91 cm AV Vmax:           122.00 cm/s AV Vmean:          90.900 cm/s AV VTI:            0.200 m AV Peak Grad:      6.0 mmHg AV Mean Grad:      4.0  mmHg LVOT Vmax:         95.00 cm/s LVOT Vmean:        64.000 cm/s LVOT VTI:          0.168 m LVOT/AV VTI ratio: 0.84  AORTA Ao Root diam: 3.05 cm MITRAL VALVE               TRICUSPID VALVE MV Area (PHT): 4.57 cm    TR Peak grad:   36.0 mmHg MV Area VTI:   2.32 cm    TR Vmax:        300.00 cm/s MV Peak grad:  4.8 mmHg MV Mean grad:  3.0 mmHg    SHUNTS MV Vmax:       1.09 m/s    Systemic VTI:  0.17 m MV Vmean:      80.1 cm/s   Systemic Diam: 2.10 cm MV Decel Time: 166 msec MV E velocity: 88.60 cm/s MV A velocity: 78.10 cm/s MV E/A ratio:  1.13 Lonni End MD Electronically signed by Lonni Hanson MD Signature Date/Time: 04/01/2023/2:19:37 PM    Final    CT Angio Chest PE W/Cm &/Or Wo Cm Result Date: 04/01/2023 CLINICAL DATA:  Cough and fever for 3 days. On chemo for pancreatic cancer. Elevated heart rate. PE suspected. EXAM: CT ANGIOGRAPHY CHEST WITH CONTRAST TECHNIQUE: Multidetector CT imaging of the chest was performed using the standard protocol during bolus administration of intravenous contrast. Multiplanar CT image reconstructions and MIPs were obtained to evaluate the vascular anatomy. RADIATION DOSE REDUCTION: This exam was performed according to the departmental dose-optimization program which includes automated exposure control, adjustment of the mA and/or kV according to patient size and/or use of iterative reconstruction technique. CONTRAST:  75mL OMNIPAQUE  IOHEXOL  350 MG/ML SOLN COMPARISON:  Same day chest radiograph and CTA chest 04/28/2022 FINDINGS: Cardiovascular: Right IJ CVC tip at the superior cavoatrial junction. Negative for acute pulmonary embolism. Large pericardial effusion increased compared to 02/27/2023. Clinical correlation recommended to exclude tamponade physiology. Mild aortic atherosclerotic calcification. Mediastinum/Nodes: Debris layering within the posterior trachea. Esophagus is unremarkable. Surgical clips adjacent to the lower esophagus. Mediastinal and hilar  adenopathy is not well evaluated. Lungs/Pleura:  Diffuse bronchial wall thickening scattered mucous plugging greatest in the right lower lobe. Bilateral centrilobular micro nodules and tree-in-bud opacities with more confluence opacities in the right lung. Irregular pleural thickening or loculated pleural effusion in the lower right chest has increased compared to 02/27/2023. There is associated pleural nodularity in the posterior lower lobe. For example 14 x 9 mm nodule on series 5/image 97. Pleural calcifications in the posterior right lung base. No pneumothorax. Upper Abdomen: Incompletely evaluated large metastasis in the right hepatic lobe appears slightly increased compared to 02/27/2023, measuring up to 14.5 cm previously 13.3 cm. Partially visualized dilated main pancreatic duct. Pancreatic head is not included in the exam. Small amount of perisplenic ascites. Musculoskeletal: No acute fracture or destructive osseous lesion. Review of the MIP images confirms the above findings. IMPRESSION: 1. Negative for acute pulmonary embolism. 2. Large pericardial effusion increased compared to 02/27/2023. Clinical correlation recommended to exclude tamponade physiology. 3. Increased bilateral centrilobular micro nodules and tree-in-bud opacities with more confluence opacities in the right lung, likely due to bronchopneumonia from aspiration. Lymphangitic spread of tumor could appear similarly. 4. Irregular pleural thickening or loculated pleural effusion in the lower right chest has increased compared to 02/27/2023. There is associated pleural nodularity in the posterior lower lobe. This may be due to bronchopneumonia though metastases are not excluded. 5. 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 Aortic Atherosclerosis (ICD10-I70.0). Electronically Signed   By: Norman Gatlin M.D.   On: 04/01/2023 00:53   DG Chest 2 View Result Date: 03/31/2023 CLINICAL DATA:  Cough and fever EXAM: CHEST - 2 VIEW COMPARISON:   Chest x-ray 03/05/2023.  Chest CT 02/27/2023. FINDINGS: There is a new right chest port with distal catheter tip projecting over the distal SVC. The cardiac silhouette appears mildly enlarged, unchanged. Small right pleural effusion persists. Patchy opacities in the right lung base have decreased, but have not completely resolved. There is a new focal nodular density in the left mid lung measuring 8 mm. The left lung is otherwise clear. There is no pneumothorax or acute fracture. IMPRESSION: 1. Small right pleural effusion persists. 2. Patchy opacities in the right lung base have decreased, but have not completely resolved. 3. New 8 mm nodular density in the left mid lung. Recommend follow-up chest CT. Electronically Signed   By: Greig Pique M.D.   On: 03/31/2023 21:10   US  Venous Img Lower Bilateral Result Date: 03/25/2023 CLINICAL DATA:  Follow-up lower extremity DVT. History of prostate cancer. Patient is on anticoagulation. EXAM: BILATERAL LOWER EXTREMITY VENOUS DOPPLER ULTRASOUND TECHNIQUE: Gray-scale sonography with graded compression, as well as color Doppler and duplex ultrasound were performed to evaluate the lower extremity deep venous systems from the level of the common femoral vein and including the common femoral, femoral, profunda femoral, popliteal and calf veins including the posterior tibial, peroneal and gastrocnemius veins when visible. The superficial great saphenous vein was also interrogated. Spectral Doppler was utilized to evaluate flow at rest and with distal augmentation maneuvers in the common femoral, femoral and popliteal veins. COMPARISON:  Bilateral lower extremity venous Doppler ultrasound-02/26/2023 (positive for DVT extending from right femoral vein through right tibial veins as well as the left popliteal vein through the left tibial veins) FINDINGS: RIGHT LOWER EXTREMITY Common Femoral Vein: No evidence of thrombus. Normal compressibility, respiratory phasicity and response  to augmentation. Saphenofemoral Junction: No evidence of thrombus. Normal compressibility and flow on color Doppler imaging. Profunda Femoral Vein: No evidence of thrombus. Normal compressibility and flow on color  Doppler imaging. Femoral Vein: There is predominantly occlusive DVT involving the proximal (image 41), mid (image 44) and distal (image 47) aspects of the right femoral vein, grossly unchanged compared to the 02/26/2023 examination. Popliteal Vein: Redemonstrated near occlusive DVT involving the right popliteal vein (images 47 and 50), similar to the 02/26/2023 examination. Calf Veins: Redemonstrated occlusive DVT involving both paired divisions of the left posterior tibial (image 56) as well as both paired divisions of the right peroneal veins (image 58). Superficial Great Saphenous Vein: No evidence of thrombus. Normal compressibility. Other Findings:  None. _________________________________________________________ LEFT LOWER EXTREMITY Common Femoral Vein: No evidence of thrombus. Normal compressibility, respiratory phasicity and response to augmentation. Saphenofemoral Junction: No evidence of thrombus. Normal compressibility and flow on color Doppler imaging. Profunda Femoral Vein: No evidence of thrombus. Normal compressibility and flow on color Doppler imaging. Femoral Vein: No evidence of thrombus. Normal compressibility, respiratory phasicity and response to augmentation. Popliteal Vein: Redemonstrated occlusive DVT involving the left popliteal vein (images 21 and 25), unchanged compared to the 02/26/2023 examination. Calf Veins: Redemonstrated occlusive DVT involving both paired divisions of the left posterior tibial vein (images 27 and 28), unchanged compared to the 02/26/2023 examination. The left peroneal veins appear patent where imaged (image 30), improved compared to the 02/26/2023 examination. Superficial Great Saphenous Vein: No evidence of thrombus. Normal compressibility. Other Findings:   None. IMPRESSION: 1. Redemonstrated bilateral lower extremity DVT extending from the right femoral vein through the right calf veins, grossly unchanged compared to the 02/26/2023 examination. 2. Resolved left peroneal DVT. 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 Electronically Signed   By: Norleen Roulette M.D.   On: 03/25/2023 12:10   IR IMAGING GUIDED PORT INSERTION Result Date: 03/24/2023 CLINICAL DATA:  Metastatic pancreas CANCER TO THE LIVER EXAM: RIGHT INTERNAL JUGULAR SINGLE LUMEN POWER PORT CATHETER INSERTION Date:  03/24/2023 03/24/2023 11:55 am Radiologist:  M. Frederic Specking, MD Guidance:  Ultrasound and fluoroscopic MEDICATIONS: 1% lidocaine  local with epinephrine  ANESTHESIA/SEDATION: Versed  0.5 mg IV; Fentanyl  25 mcg IV; Moderate Sedation Time:  30 minutes The patient was continuously monitored during the procedure by the interventional radiology nurse under my direct supervision. FLUOROSCOPY: 0 minutes, 71 seconds (3.4 mGy) COMPLICATIONS: None immediate. CONTRAST:  None. PROCEDURE: Informed consent was obtained from the patient following explanation of the procedure, risks, benefits and alternatives. The patient understands, agrees and consents for the procedure. All questions were addressed. A time out was performed. Maximal barrier sterile technique utilized including caps, mask, sterile gowns, sterile gloves, large sterile drape, hand hygiene, and 2% chlorhexidine  scrub. Under sterile conditions and local anesthesia, right internal jugular micropuncture venous access was performed. Access was performed with ultrasound. Images were obtained for documentation of the patent right internal jugular vein. A guide wire was inserted followed by a transitional dilator. This allowed insertion of a guide wire and catheter into the IVC. Measurements were obtained from the SVC / RA junction  back to the right IJ venotomy site. In the right infraclavicular chest, a subcutaneous pocket was created over the second anterior rib. This was done under sterile conditions and local anesthesia. 1% lidocaine  with epinephrine  was utilized for this. A 2.5 cm incision was made in the skin. Blunt dissection was performed to create a subcutaneous pocket over the right pectoralis major muscle. The pocket was flushed with saline vigorously. There was adequate hemostasis. The port catheter was assembled and checked  for leakage. The port catheter was secured in the pocket with two retention sutures. The tubing was tunneled subcutaneously to the right venotomy site and inserted into the SVC/RA junction through a valved peel-away sheath. Position was confirmed with fluoroscopy. Images were obtained for documentation. The patient tolerated the procedure well. No immediate complications. Incisions were closed in a two layer fashion with 4 - 0 Vicryl suture. Dermabond was applied to the skin. The port catheter was accessed, blood was aspirated followed by saline and heparin  flushes. Needle was removed. A dry sterile dressing was applied. IMPRESSION: Ultrasound and fluoroscopically guided right internal jugular single lumen power port catheter insertion. Tip in the SVC/RA junction. Catheter ready for use. Electronically Signed   By: CHRISTELLA.  Shick M.D.   On: 03/24/2023 12:17     No problem-specific Assessment & Plan notes found for this encounter.   # 63 year old male patient with metastatic pancreatic cancer-on palliative chemotherapy is currently admitted to hospital for malignant hypercalcemia/worsening hypoxia.  # Metastatic pancreatic cancer-on palliative chemotherapy.  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  # Cancer cachexia  # Hypercalcemia/low albumin   Recommendations/plan:  # I had a long discussion with  the patient's daughter and his wife [on the phone] regarding the overall poor prognosis of patient clinical status.  Patient not a candidate for any further chemotherapy or any other therapies.   # Long discussion regarding hospice.  Given the clinical status patient will be candidate for hospice home.  # Discussed DNR/DNI-they are in agreement.  Appropriate changes made.  # 60 minutes face-to-face with the patient discussing the above plan of care; more than 50% of time spent on prognosis/ natural history; counseling and coordination.  Discussed with Sidra Borders and also the primary team.     Latriece Anstine R Jadie Comas, MD 04/22/2023 1:58 PM

## 2023-04-23 ENCOUNTER — Encounter: Payer: Self-pay | Admitting: Internal Medicine

## 2023-04-23 LAB — CULTURE, BLOOD (ROUTINE X 2)
Culture: NO GROWTH
Culture: NO GROWTH

## 2023-04-25 ENCOUNTER — Inpatient Hospital Stay: Payer: BC Managed Care – PPO | Admitting: Internal Medicine

## 2023-04-25 ENCOUNTER — Inpatient Hospital Stay: Payer: BC Managed Care – PPO

## 2023-04-29 ENCOUNTER — Other Ambulatory Visit: Payer: BC Managed Care – PPO

## 2023-04-30 ENCOUNTER — Ambulatory Visit: Payer: BC Managed Care – PPO

## 2023-04-30 ENCOUNTER — Other Ambulatory Visit: Payer: BC Managed Care – PPO

## 2023-04-30 ENCOUNTER — Ambulatory Visit: Payer: BC Managed Care – PPO | Admitting: Internal Medicine

## 2023-05-02 ENCOUNTER — Ambulatory Visit: Payer: BC Managed Care – PPO | Admitting: Medical

## 2023-05-13 NOTE — Addendum Note (Signed)
Encounter addended by: Edward Qualia on: 05/13/2023 1:33 PM  Actions taken: Imaging Exam ended

## 2023-05-17 NOTE — Telephone Encounter (Signed)
 Form faxed and copied for chart, copy for [name] put at front desk. Sherry notified via patient MyChart message.

## 2023-05-17 NOTE — Telephone Encounter (Signed)
 Form has been signed.

## 2023-05-17 DEATH — deceased
